# Patient Record
Sex: Male | Born: 1945
Health system: Southern US, Community
[De-identification: ages and names within clinical notes are randomized; demographics above are authoritative.]

## PROBLEM LIST (undated history)

## (undated) DIAGNOSIS — E785 Hyperlipidemia, unspecified: Secondary | ICD-10-CM

## (undated) DIAGNOSIS — R001 Bradycardia, unspecified: Secondary | ICD-10-CM

## (undated) DIAGNOSIS — D649 Anemia, unspecified: Secondary | ICD-10-CM

## (undated) DIAGNOSIS — N200 Calculus of kidney: Secondary | ICD-10-CM

## (undated) DIAGNOSIS — I10 Essential (primary) hypertension: Secondary | ICD-10-CM

## (undated) DIAGNOSIS — K56609 Unspecified intestinal obstruction, unspecified as to partial versus complete obstruction: Secondary | ICD-10-CM

## (undated) DIAGNOSIS — I209 Angina pectoris, unspecified: Secondary | ICD-10-CM

## (undated) DIAGNOSIS — K219 Gastro-esophageal reflux disease without esophagitis: Secondary | ICD-10-CM

## (undated) DIAGNOSIS — F32A Depression, unspecified: Secondary | ICD-10-CM

## (undated) DIAGNOSIS — E119 Type 2 diabetes mellitus without complications: Secondary | ICD-10-CM

## (undated) DIAGNOSIS — Z87442 Personal history of urinary calculi: Secondary | ICD-10-CM

## (undated) DIAGNOSIS — C801 Malignant (primary) neoplasm, unspecified: Secondary | ICD-10-CM

## (undated) DIAGNOSIS — I2510 Atherosclerotic heart disease of native coronary artery without angina pectoris: Secondary | ICD-10-CM

## (undated) HISTORY — DX: Atherosclerotic heart disease of native coronary artery without angina pectoris: I25.10

## (undated) HISTORY — PX: EYE SURGERY: SHX253

## (undated) HISTORY — PX: CORONARY ANGIOPLASTY: SHX604

## (undated) HISTORY — DX: Essential (primary) hypertension: I10

## (undated) HISTORY — DX: Bradycardia, unspecified: R00.1

## (undated) HISTORY — PX: CARDIAC CATHETERIZATION: SHX172

## (undated) HISTORY — PX: OTHER SURGICAL HISTORY: SHX169

## (undated) HISTORY — DX: Type 2 diabetes mellitus without complications: E11.9

## (undated) HISTORY — PX: CATARACT EXTRACTION: SUR2

## (undated) HISTORY — DX: Calculus of kidney: N20.0

## (undated) HISTORY — DX: Hyperlipidemia, unspecified: E78.5

## (undated) HISTORY — PX: KIDNEY STONE SURGERY: SHX686

## (undated) MED FILL — Dexamethasone Sodium Phosphate Inj 100 MG/10ML: INTRAMUSCULAR | Qty: 1 | Status: AC

---

## 2008-06-19 ENCOUNTER — Ambulatory Visit: Payer: Self-pay | Admitting: Ophthalmology

## 2008-06-26 ENCOUNTER — Ambulatory Visit: Payer: Self-pay | Admitting: Ophthalmology

## 2008-07-24 ENCOUNTER — Ambulatory Visit: Payer: Self-pay | Admitting: Ophthalmology

## 2008-08-19 ENCOUNTER — Emergency Department: Payer: Self-pay | Admitting: Emergency Medicine

## 2008-08-21 ENCOUNTER — Ambulatory Visit: Payer: Self-pay | Admitting: Urology

## 2008-08-23 ENCOUNTER — Ambulatory Visit: Payer: Self-pay | Admitting: Urology

## 2008-08-25 ENCOUNTER — Emergency Department: Payer: Self-pay | Admitting: Emergency Medicine

## 2008-09-06 ENCOUNTER — Ambulatory Visit: Payer: Self-pay | Admitting: Urology

## 2008-09-14 ENCOUNTER — Ambulatory Visit: Payer: Self-pay | Admitting: Urology

## 2008-10-19 ENCOUNTER — Inpatient Hospital Stay (HOSPITAL_COMMUNITY): Admission: RE | Admit: 2008-10-19 | Discharge: 2008-10-20 | Payer: Self-pay | Admitting: Cardiovascular Disease

## 2009-02-04 ENCOUNTER — Ambulatory Visit: Payer: Self-pay | Admitting: Urology

## 2009-05-24 ENCOUNTER — Encounter: Payer: Self-pay | Admitting: Cardiovascular Disease

## 2009-09-20 IMAGING — CR DG ABDOMEN 1V
1 series · 2 of 2 positions shown · non-contrast
Comparison: none

REASON FOR EXAM: Nephrolithiasis
COMMENTS:

[Series 1: view not recorded · 0.17mm/px · 2 of 2 slices shown]
[im 1/2]
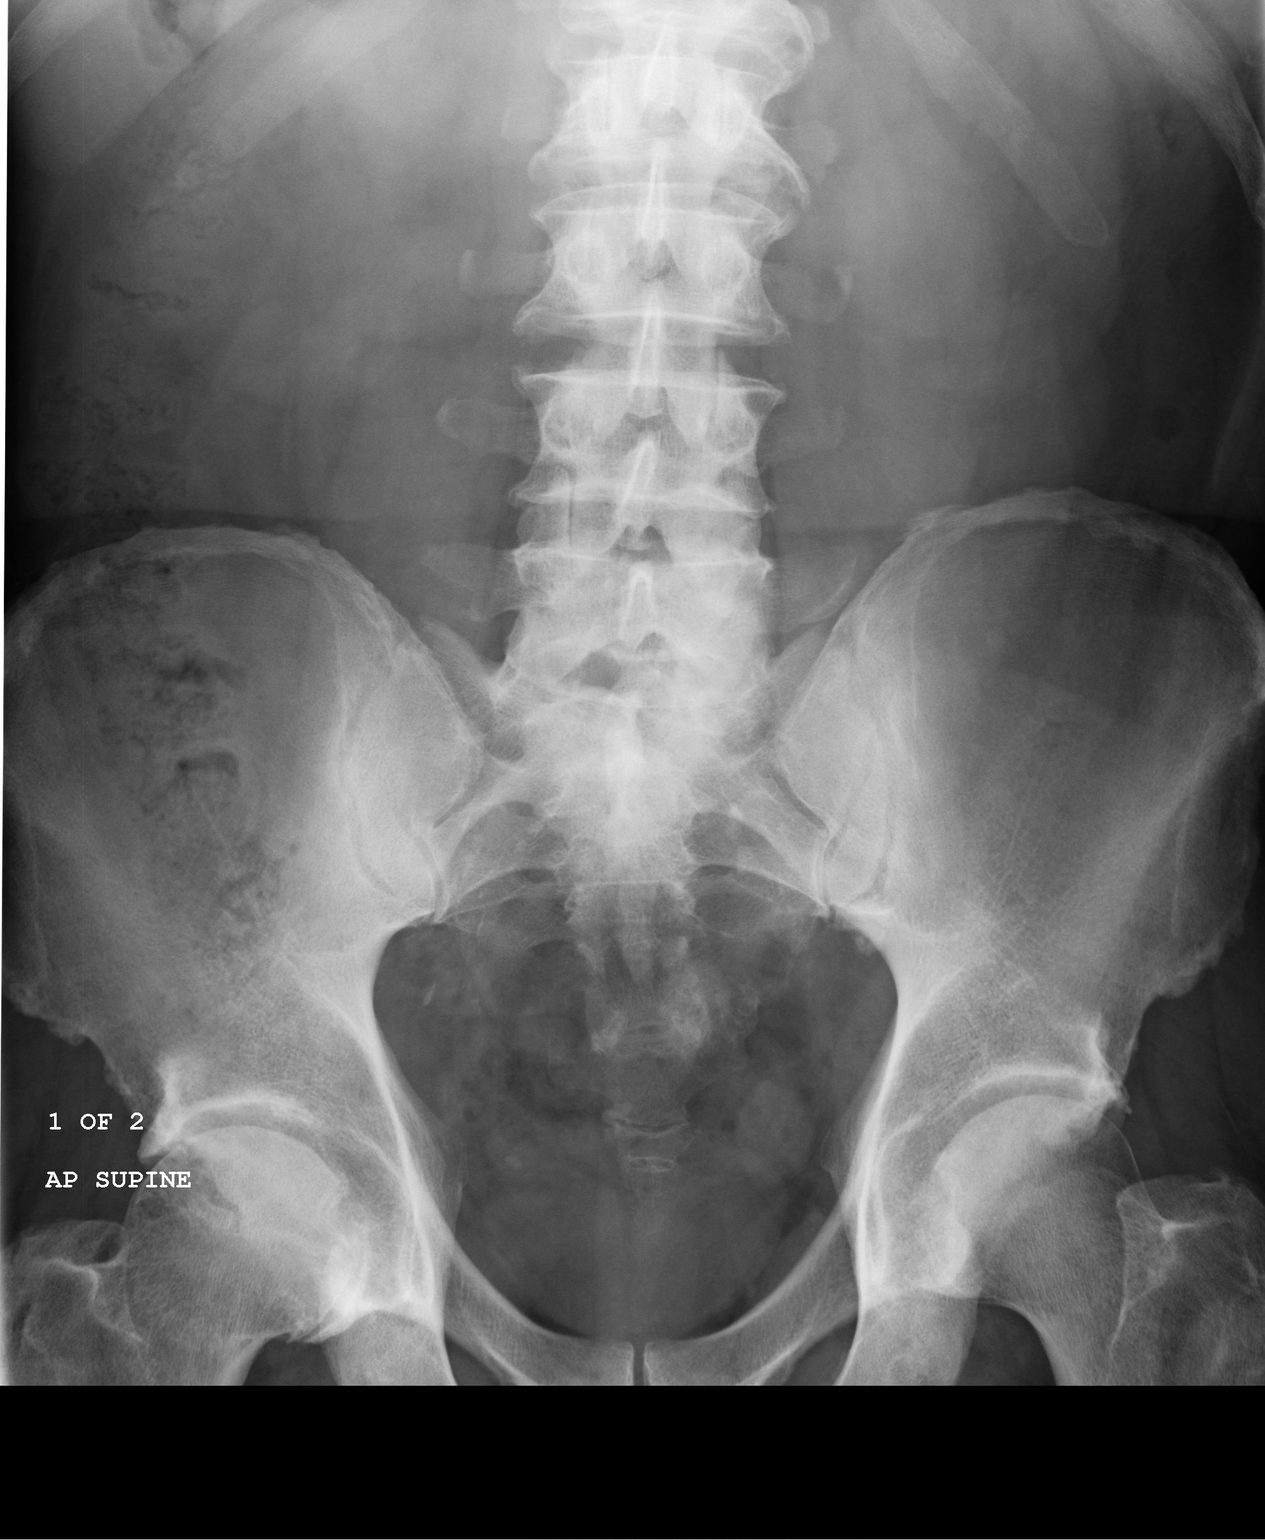
[im 2/2]
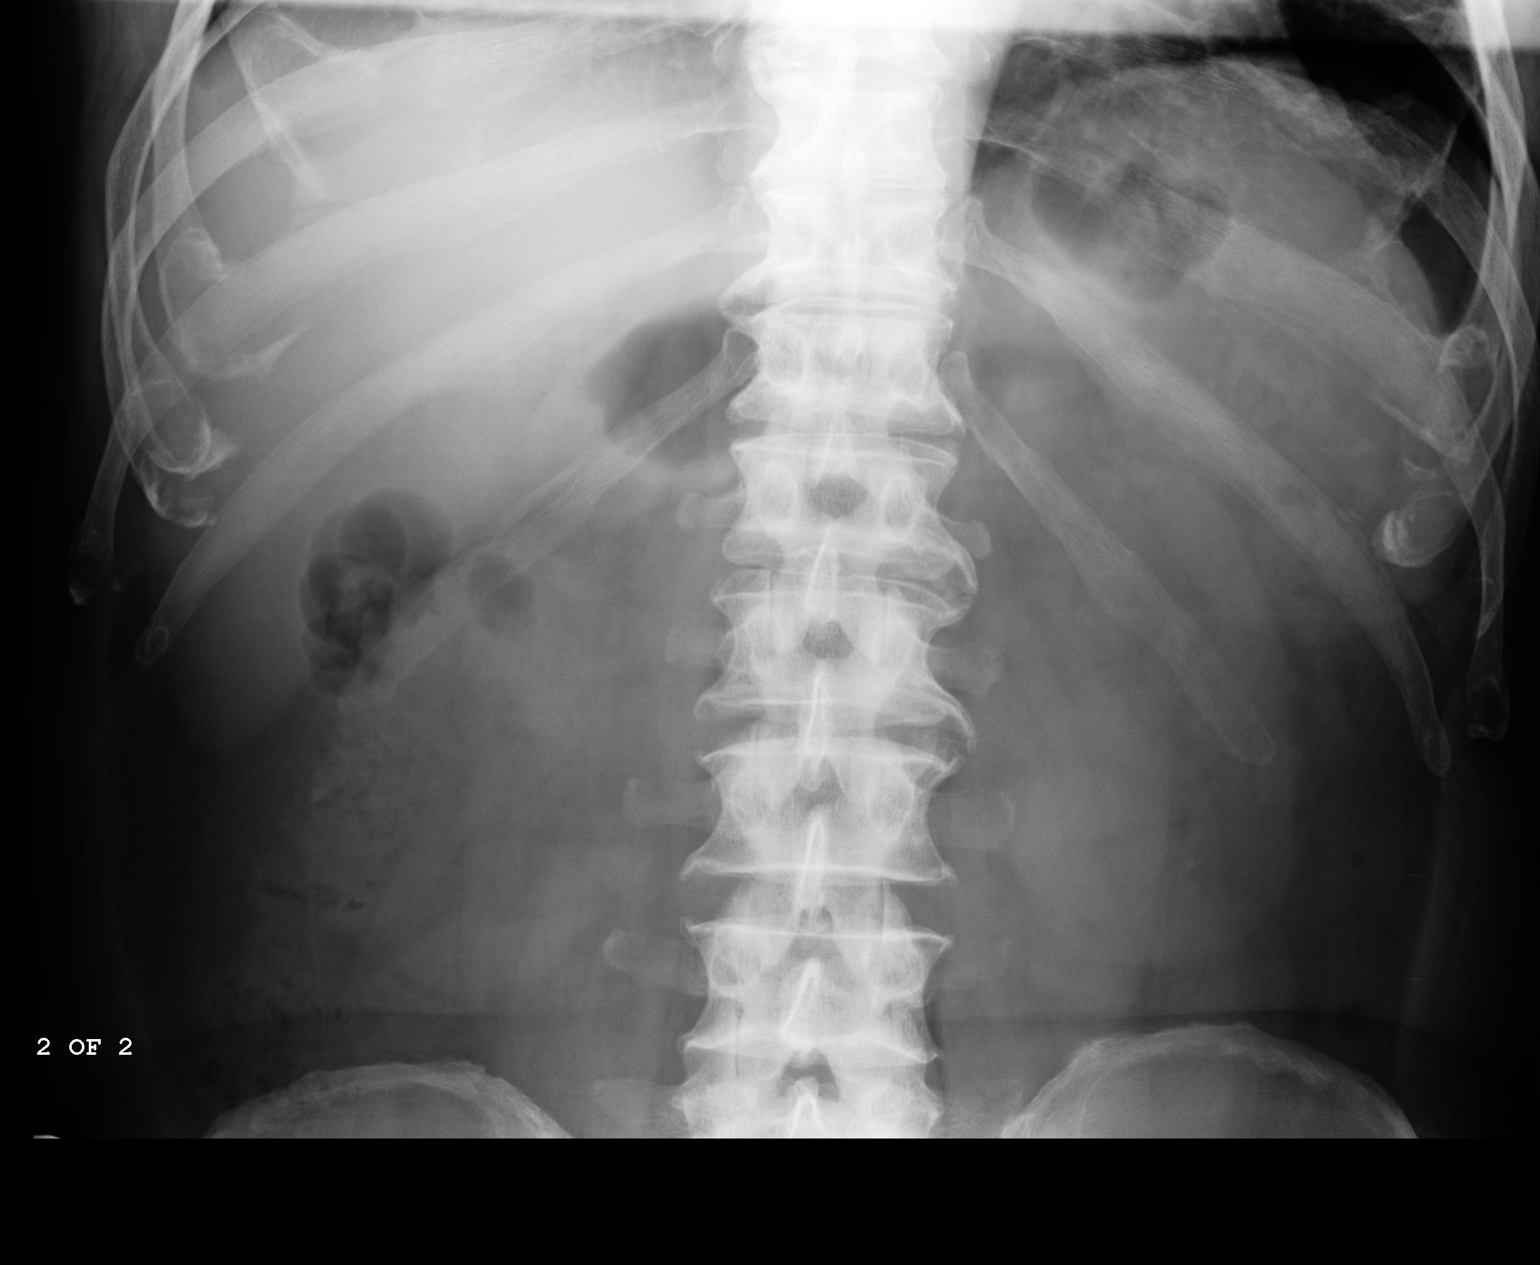

[2 of 2 positions shown; findings below may reference images not displayed]

PROCEDURE:     DXR - DXR KIDNEY URETER BLADDER  - August 21, 2008 [DATE]

RESULT:     Images of the abdomen demonstrate extensive degenerative changes
in the lumbar spine and lower thoracic spine. There is no abnormal bowel
distention. There is no definite renal calculus evident. There is some
density over the region of the distal RIGHT ureter which may account for the
previous stone seen on CT. The stones demonstrated at CT in the LEFT kidney
are not definitely evident
IMPRESSION: Please see above.

## 2009-10-06 IMAGING — CR DG ABDOMEN 1V
1 series · 2 of 2 positions shown · non-contrast
Comparison: none

REASON FOR EXAM: nephrolithiasis pt need films
COMMENTS:

[Series 1: view not recorded · 0.17mm/px · 2 of 2 slices shown]
[im 1/2]
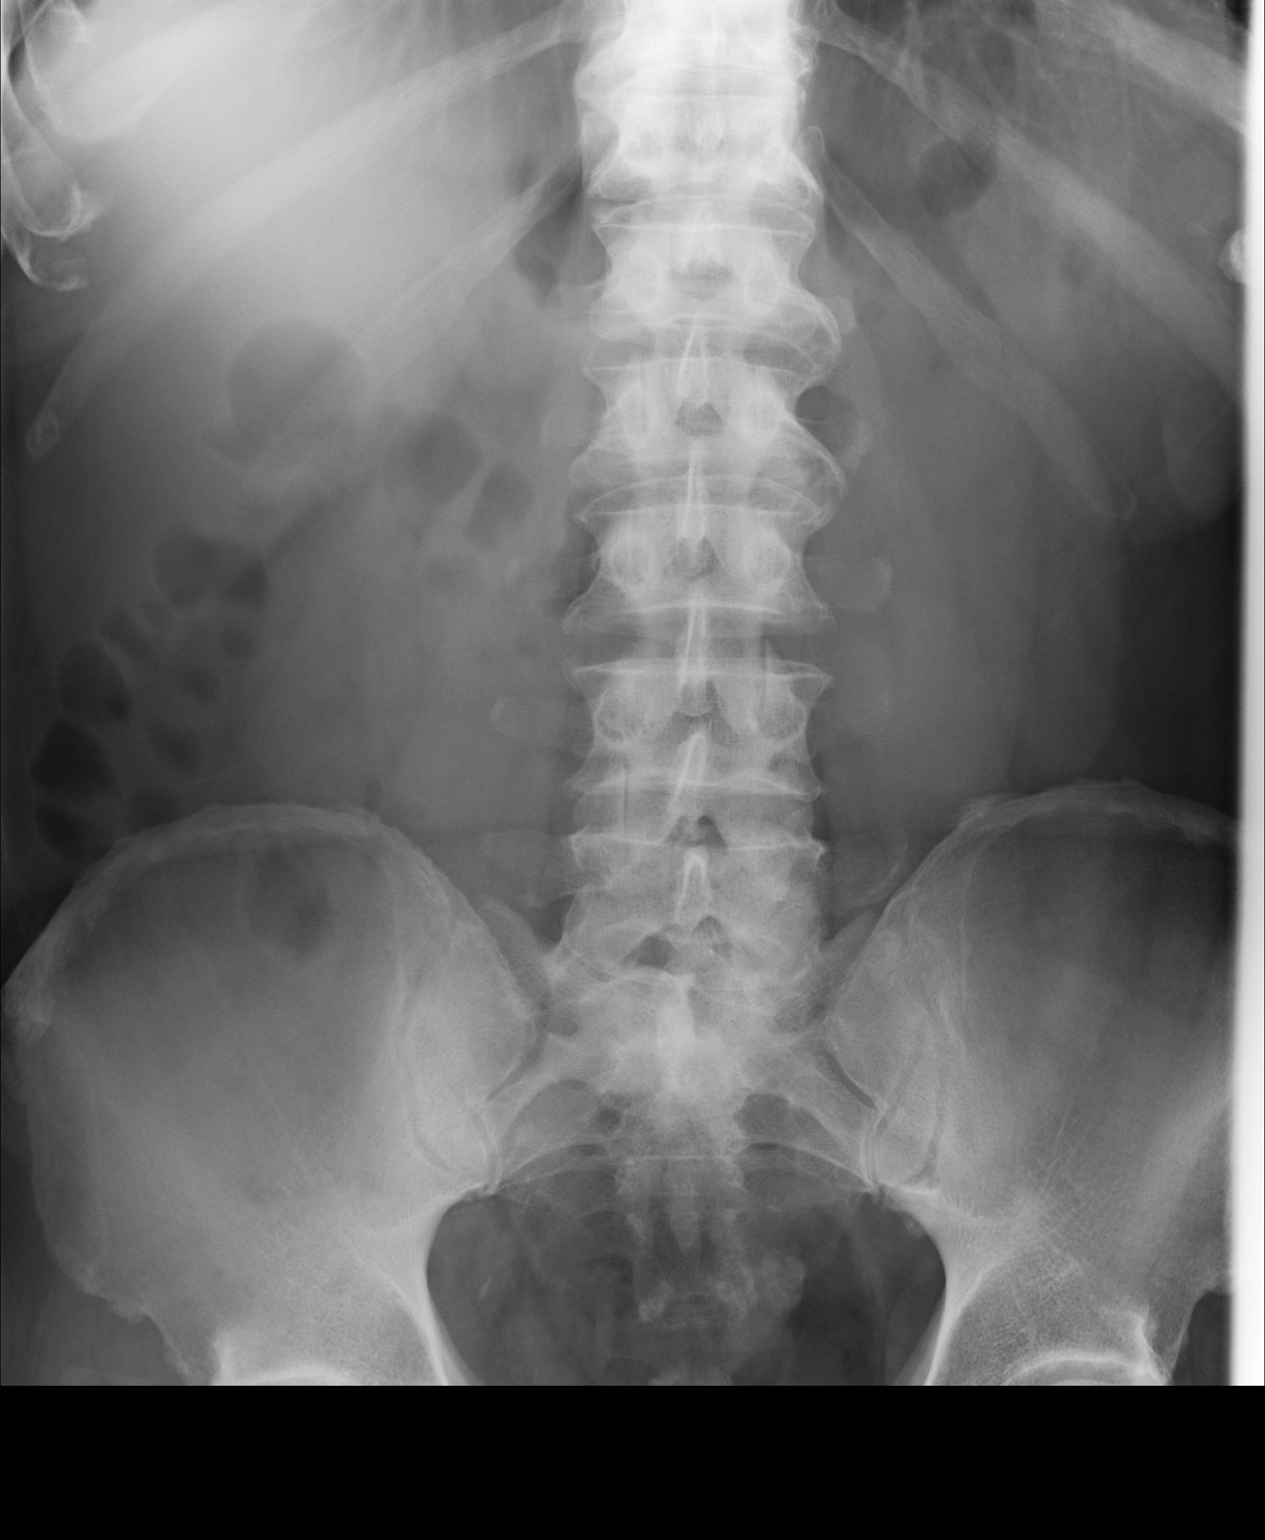
[im 2/2]
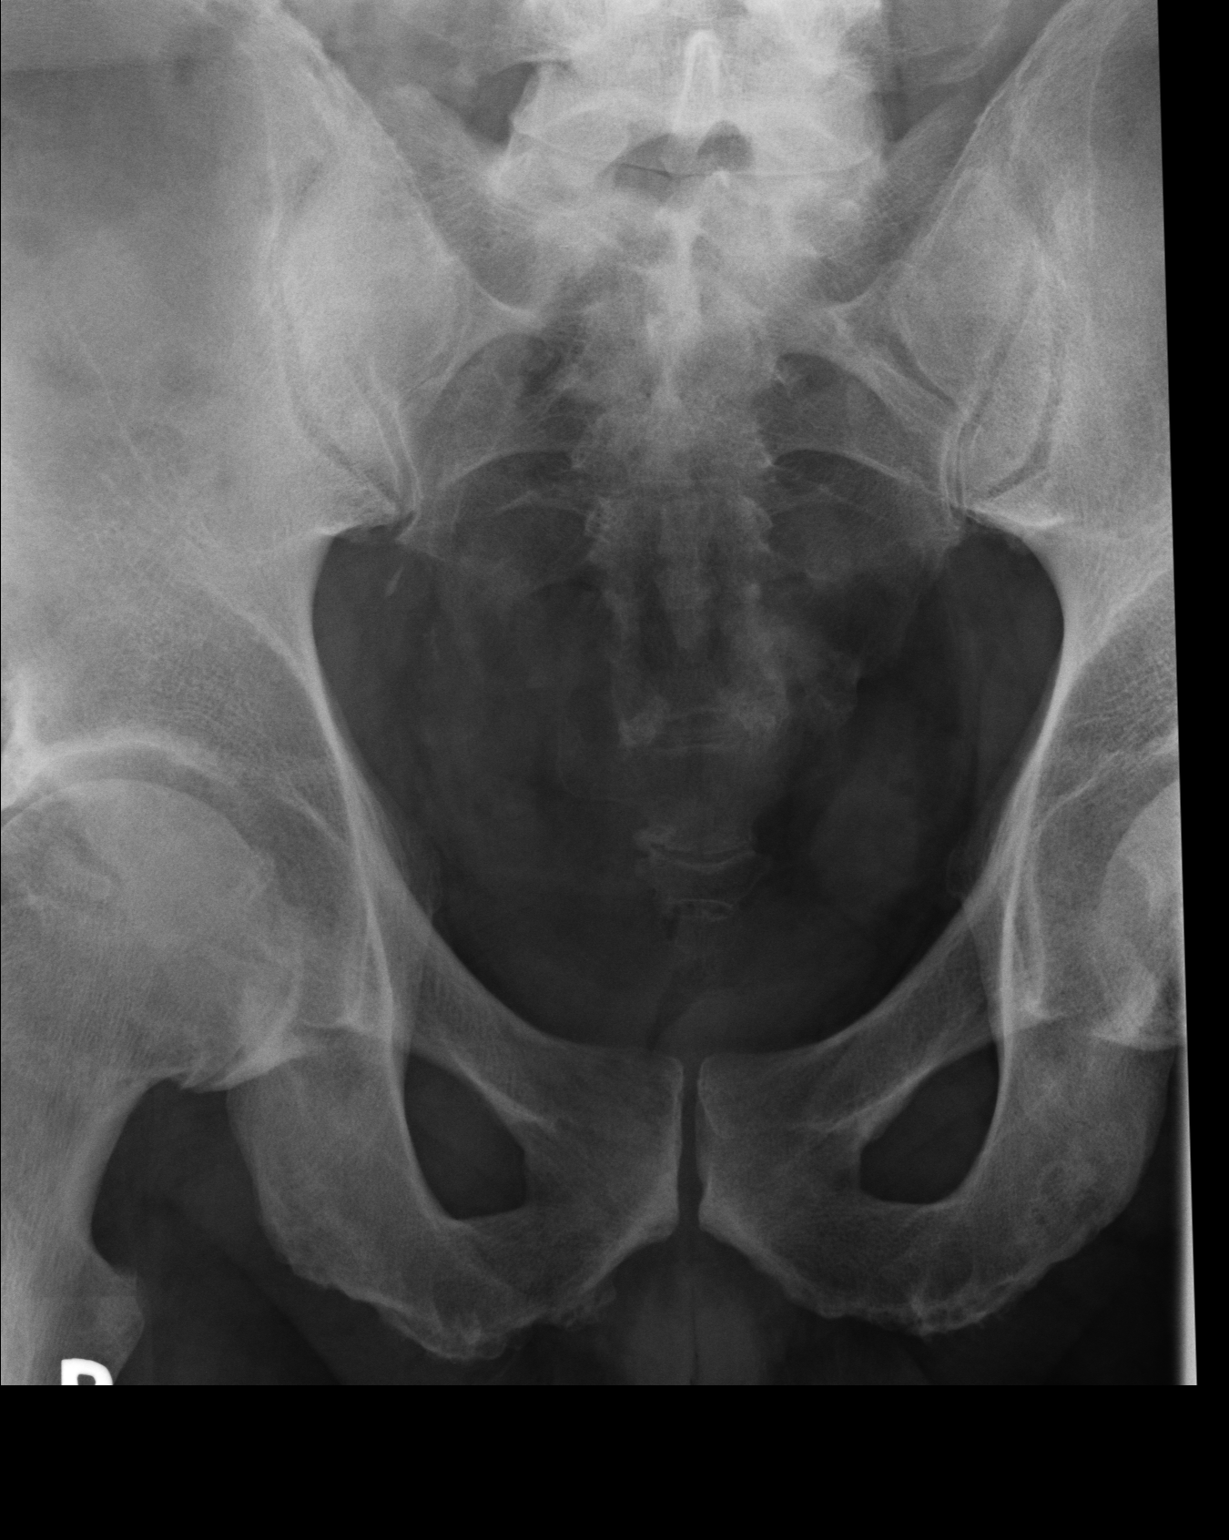

[2 of 2 positions shown; findings below may reference images not displayed]

PROCEDURE:     DXR - DXR KIDNEY URETER BLADDER  - September 06, 2008 [DATE]

RESULT:     Comparison is made to the prior exam of 08/23/2008. The distal
RIGHT ureteral stone previously observed is no longer seen. No definite
renal calcifications are identified. The LEFT nephrolithiasis noted at prior
CT is not identified on plain film examination.
IMPRESSION: 1.     Please see above.

## 2010-02-12 ENCOUNTER — Ambulatory Visit: Payer: Self-pay | Admitting: Urology

## 2010-02-13 ENCOUNTER — Encounter: Payer: Self-pay | Admitting: Cardiovascular Disease

## 2010-03-12 ENCOUNTER — Encounter: Payer: Self-pay | Admitting: Cardiovascular Disease

## 2010-04-04 ENCOUNTER — Ambulatory Visit: Payer: Self-pay | Admitting: Cardiovascular Disease

## 2010-04-04 DIAGNOSIS — F17200 Nicotine dependence, unspecified, uncomplicated: Secondary | ICD-10-CM

## 2010-04-04 DIAGNOSIS — E785 Hyperlipidemia, unspecified: Secondary | ICD-10-CM | POA: Insufficient documentation

## 2010-04-04 DIAGNOSIS — E1169 Type 2 diabetes mellitus with other specified complication: Secondary | ICD-10-CM

## 2010-04-04 DIAGNOSIS — I152 Hypertension secondary to endocrine disorders: Secondary | ICD-10-CM | POA: Insufficient documentation

## 2010-04-04 DIAGNOSIS — I2510 Atherosclerotic heart disease of native coronary artery without angina pectoris: Secondary | ICD-10-CM | POA: Insufficient documentation

## 2010-04-04 DIAGNOSIS — Z720 Tobacco use: Secondary | ICD-10-CM | POA: Insufficient documentation

## 2010-04-04 DIAGNOSIS — I498 Other specified cardiac arrhythmias: Secondary | ICD-10-CM

## 2010-04-04 DIAGNOSIS — I10 Essential (primary) hypertension: Secondary | ICD-10-CM

## 2010-04-04 HISTORY — DX: Type 2 diabetes mellitus with other specified complication: E11.69

## 2010-10-23 ENCOUNTER — Ambulatory Visit: Payer: Self-pay | Admitting: Cardiovascular Disease

## 2010-10-23 ENCOUNTER — Encounter: Payer: Self-pay | Admitting: Cardiovascular Disease

## 2010-11-07 ENCOUNTER — Encounter: Payer: Self-pay | Admitting: Cardiovascular Disease

## 2010-11-07 ENCOUNTER — Ambulatory Visit: Payer: Self-pay | Admitting: Cardiovascular Disease

## 2010-11-11 LAB — CONVERTED CEMR LAB
AST: 15 units/L (ref 0–37)
Albumin: 4.4 g/dL (ref 3.5–5.2)
HDL: 31 mg/dL — ABNORMAL LOW (ref >39)
Total Bilirubin: 0.6 mg/dL (ref 0.3–1.2)
Total CHOL/HDL Ratio: 4.8
VLDL: 41 mg/dL — ABNORMAL HIGH (ref 0–40)

## 2010-12-16 NOTE — Letter (Signed)
Summary: PHI  PHI   Imported By: Harlon Flor 04/08/2010 16:39:25  _____________________________________________________________________  External Attachment:    Type:   Image     Comment:   External Document

## 2010-12-16 NOTE — Miscellaneous (Signed)
Summary: lisinopril refill  Clinical Lists Changes  Medications: Added new medication of LISINOPRIL-HYDROCHLOROTHIAZIDE 20-12.5 MG TABS (LISINOPRIL-HYDROCHLOROTHIAZIDE) 1 tab by mouth daily - Signed Rx of LISINOPRIL-HYDROCHLOROTHIAZIDE 20-12.5 MG TABS (LISINOPRIL-HYDROCHLOROTHIAZIDE) 1 tab by mouth daily;  #30 x 6;  Signed;  Entered by: Charlena Cross, RN, BSN;  Authorized by: Dossie Arbour MD;  Method used: Electronically to [facility]*, [address] [place], [place], [place]  [address], Ph: [phone], Fax: [phone]    Prescriptions: LISINOPRIL-HYDROCHLOROTHIAZIDE 20-12.5 MG TABS (LISINOPRIL-HYDROCHLOROTHIAZIDE) 1 tab by mouth daily  #30 x 6   Entered by:   Charlena Cross, RN, BSN   Authorized by:   Dossie Arbour MD   Signed by:   Charlena Cross, RN, BSN on 02/13/2010   Method used:   Electronically to        [facility]* (retail)       [address] [place], [place]  [address]       Ph: [phone]       Fax: [phone]   RxID:   [id number]

## 2010-12-16 NOTE — Letter (Signed)
Summary: Medical Record Release  Medical Record Release   Imported By: Harlon Flor 03/12/2010 09:38:39  _____________________________________________________________________  External Attachment:    Type:   Image     Comment:   External Document

## 2010-12-16 NOTE — Assessment & Plan Note (Signed)
Summary: F6M/AMD   Visit Type:  Follow-up Primary Provider:  None  CC:  6 month f/u. c/o occasional soreness in chest. denies SOB and chest pains..  History of Present Illness: 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  He continues to smoke1/2 to one ppd. 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 He has not had much chest pain, only rarely which is mild and fleeting. He is getting over an upper respiratory infection. Otherwise he has no complaints  EKG shows sinus bradycardia with rate 53 beats per minute, intraventricular conduction delay, QRS 136 ms     Current Medications (verified): 1)  Lisinopril-Hydrochlorothiazide 20-12.5 Mg Tabs (Lisinopril-Hydrochlorothiazide) .Marland Kitchen.. 1 Tab By Mouth Daily 2)  Plavix 75 Mg Tabs (Clopidogrel Bisulfate) .Marland Kitchen.. 1 Tab Once Daily 3)  Nitrostat 0.4 Mg Subl (Nitroglycerin) .Marland Kitchen.. 1 Tablet Under Tongue At Onset of Chest Pain; You May Repeat Every 5 Minutes For Up To 3 Doses. 4)  Aspirin 81 Mg Tbec (Aspirin) .... When Pt Remembers 5)  Crestor 10 Mg Tabs (Rosuvastatin Calcium) .Marland Kitchen.. 1 Tab Once Daily 6)  Metoprolol Tartrate 25 Mg Tabs (Metoprolol Tartrate) .... 1/2 Tablet Two Times A Day 7)  Isosorbide Mononitrate Cr 30 Mg Xr24h-Tab (Isosorbide Mononitrate) .Marland Kitchen.. 1 Tab At Bedtime  Allergies (verified): No Known Drug Allergies  Past History:  Past Medical History: Last updated: 04/04/2010 CAD, NATIVE VESSEL (ICD-414.01) BRADYCARDIA (ICD-427.89) HYPERTENSION (ICD-401.9) HYPERLIPIDEMIA (ICD-272.4) TOBACCO ABUSE (ICD-305.1)  Past Surgical History: Last updated:  04/04/2010 stent cataract surgery nephrolithiasis  Family History: Last updated: 04/04/2010 Family History of Diabetes:  Family History of Coronary Artery Disease:   Social History: Last updated: 04/04/2010 Full Time Married  Tobacco Use - Yes.  Alcohol Use - no Regular Exercise - no Drug Use - no  Risk Factors: Exercise: no (04/04/2010)  Risk Factors: Smoking Status: current (04/04/2010)  Review of Systems  The patient denies fever, weight loss, weight gain, vision loss, decreased hearing, hoarseness, chest pain, syncope, dyspnea on exertion, peripheral edema, prolonged cough, abdominal pain, incontinence, muscle weakness, depression, and enlarged lymph nodes.         Rare chest pain  Vital Signs:  Patient profile:   65 year old male Height:      72 inches Weight:      206.75 pounds BMI:     28.14 Pulse rate:   53 / minute BP sitting:   112 / 68  (left arm) Cuff size:   regular  Vitals Entered By: Lysbeth Galas CMA (October 23, 2010 11:36 AM)  Physical Exam  General:  well-appearing middle-aged gentleman in no apparent distress, HEENT benign, oropharynx is clear, neck is supple with no JVP or carotid bruits, heart sounds are regular with normal S1-S2 and no murmurs appreciated, lungs are clear to auscultation with no wheezes Rales, abdominal exam is benign, no significant lower extremity edema, neurologic exam is nonfocal, skin is warm and dry. Pulses are equal and symmetrical in his upper and lower extremities.   Impression & Recommendations:  Problem # 1:  CAD, NATIVE VESSEL (ICD-414.01) continue aggressive lipid management. Encouraged smoking cessation again. No testing at this time as  no significant chest pain.  His updated medication list for this problem includes:    Lisinopril-hydrochlorothiazide 20-12.5 Mg Tabs (Lisinopril-hydrochlorothiazide) .Marland Kitchen... 1 tab by mouth daily    Plavix 75 Mg Tabs (Clopidogrel bisulfate) .Marland Kitchen... 1 tab once daily    Nitrostat  0.4 Mg Subl (Nitroglycerin) .Marland Kitchen... 1 tablet under tongue at onset of chest pain; you may repeat every 5 minutes for up to 3 doses.    Aspirin 81 Mg Tbec (Aspirin) .Marland Kitchen... When pt remembers    Metoprolol Tartrate 25 Mg Tabs (Metoprolol tartrate) .Marland Kitchen... 1/2 tablet two times a day    Isosorbide Mononitrate Cr 30 Mg Xr24h-tab (Isosorbide mononitrate) .Marland Kitchen... 1 tab at bedtime  Problem # 2:  HYPERLIPIDEMIA (ICD-272.4) We have asked him to come in for a lipid and LFTs in the next week or so.  His updated medication list for this problem includes:    Crestor 10 Mg Tabs (Rosuvastatin calcium) .Marland Kitchen... 1 tab once daily  Problem # 3:  TOBACCO ABUSE (ICD-305.1) Strongly encouraged him to stop smoking.  Problem # 4:  HYPERTENSION (ICD-401.9) blood pressure is well controlled on his current medication changes. No other changes made.  His updated medication list for this problem includes:    Lisinopril-hydrochlorothiazide 20-12.5 Mg Tabs (Lisinopril-hydrochlorothiazide) .Marland Kitchen... 1 tab by mouth daily    Aspirin 81 Mg Tbec (Aspirin) .Marland Kitchen... When pt remembers    Metoprolol Tartrate 25 Mg Tabs (Metoprolol tartrate) .Marland Kitchen... 1/2 tablet two times a day  Patient Instructions: 1)  Your physician recommends that you schedule a follow-up appointment in: 6 months 2)  Your physician recommends that you return for a FASTING lipid profile: In 1-2 weeks (LFTs/Lipid) 3)  Your physician recommends that you continue on your current medications as directed. Please refer to the Current Medication list given to you today. Prescriptions: ISOSORBIDE MONONITRATE CR 30 MG XR24H-TAB (ISOSORBIDE MONONITRATE) 1 tab at bedtime  #30 x 12   Entered by:   Lanny Hurst RN   Authorized by:   Dossie Arbour MD   Signed by:   Lanny Hurst RN on 10/23/2010   Method used:   Electronically to        [facility]* (retail)       [address] [place], [place]  [address]       Ph: [phone]       Fax: [phone]   RxID:    [id number]

## 2010-12-16 NOTE — Letter (Signed)
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      Sincerely,  Dr. Julien Nordmann

## 2010-12-16 NOTE — Assessment & Plan Note (Signed)
Summary: NP6/AMD   Visit Type:  new pt visit   History of Present Illness: 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 He was last seen at [facility] by myself in March of 2010.  He states that overall he's been doing well. He quit smoking for a short time though has been smoking one pack per day for a year. He's been off his Crestor for one year and it is uncertain whether the prescription ran out or he had muscle aches. 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    Current Medications (verified): 1)  Lisinopril-Hydrochlorothiazide 20-12.5 Mg Tabs (Lisinopril-Hydrochlorothiazide) .Marland Kitchen.. 1 Tab By Mouth Daily 2)  Plavix 75 Mg Tabs (Clopidogrel Bisulfate) .Marland Kitchen.. 1 Tab Once Daily 3)  Nitrostat 0.4 Mg Subl (Nitroglycerin) .Marland Kitchen.. 1 Tablet Under Tongue At Onset of Chest Pain; You May Repeat Every 5 Minutes For Up To 3 Doses. 4)  Aspirin 81 Mg Tbec (Aspirin) .... When Pt Remembers 5)  Crestor 10 Mg Tabs (Rosuvastatin Calcium) .Marland Kitchen.. 1 Tab Once Daily 6)  Pepcid 40 Mg Tabs (Famotidine) .Marland Kitchen.. 1 Tab Once Daily 7)  Metoprolol Tartrate 25 Mg Tabs (Metoprolol Tartrate) .Marland Kitchen.. 1 Tab Two Times A Day 8)  Isosorbide Mononitrate Cr 30 Mg Xr24h-Tab (Isosorbide Mononitrate) .Marland Kitchen.. 1 Tab At Bedtime  Allergies (verified): No Known Drug Allergies  Past History:  Past Medical History: Last updated:  04/04/2010 CAD, NATIVE VESSEL (ICD-414.01) BRADYCARDIA (ICD-427.89) HYPERTENSION (ICD-401.9) HYPERLIPIDEMIA (ICD-272.4) TOBACCO ABUSE (ICD-305.1)  Family History: Last updated: 04/04/2010 Family History of Diabetes:  Family History of Coronary Artery Disease:   Social History: Last updated: 04/04/2010 Full Time Married  Tobacco Use - Yes.  Alcohol Use - no Regular Exercise - no Drug Use - no  Risk Factors: Exercise: no (04/04/2010)  Risk Factors: Smoking Status: current (04/04/2010)  Past Surgical History: stent cataract surgery nephrolithiasis  Family History: Family History of Diabetes:  Family History of Coronary Artery Disease:   Social History: Full Time Married  Tobacco Use - Yes.  Alcohol Use - no Regular Exercise - no Drug Use - no Smoking Status:  current Does Patient Exercise:  no Drug Use:  no  Review of Systems  The patient denies fever, weight loss, weight gain, vision loss, decreased hearing, hoarseness, chest pain, syncope, dyspnea on exertion, peripheral edema, prolonged cough, abdominal pain, incontinence, muscle weakness, depression, and enlarged lymph nodes.    Vital Signs:  Patient profile:   65 year old male Height:      72 inches Weight:      203 pounds BMI:     27.63 Pulse rate:   48 / minute Pulse rhythm:   irregular BP sitting:   138 / 76  (left arm) Cuff size:   large  Vitals Entered By: Danielle Rankin, CMA (Apr 04, 2010 10:27 AM)  Physical Exam  General:  well-appearing middle-aged gentleman in no apparent distress, HEENT  benign, oropharynx is clear, neck is supple with no JVP or carotid bruits, heart sounds are regular with normal S1-S2 and no murmurs appreciated, lungs are clear to auscultation with no wheezes Rales, abdominal exam is benign, no significant lower extremity edema, neurologic exam is nonfocal, skin is warm and dry. Pulses are equal and symmetrical in his upper and lower extremities.    EKG  Procedure  date:  04/04/2010  Findings:      Normal sinus rhythm with rate 48 beats per minute, right bundle branch block, no significant ST or T wave changes.  Impression & Recommendations:  Problem # 1:  CAD, NATIVE VESSEL (ICD-414.01) [name] is at high risk of worsening coronary disease as he is not on a statin, he continues to smoke one pack per day. We have talked to him about his smoking, alcohol or progression of his underlying coronary disease and worsening emphysema.  He does not want to try 10 6, and he will try other over-the-counter alternatives. His friend has electronic cigarettes and he will try this.  We'll restart his cholesterol medication, continue aspirin and Plavix.  His updated medication list for this problem includes:    Lisinopril-hydrochlorothiazide 20-12.5 Mg Tabs (Lisinopril-hydrochlorothiazide) .Marland Kitchen... 1 tab by mouth daily    Plavix 75 Mg Tabs (Clopidogrel bisulfate) .Marland Kitchen... 1 tab once daily    Nitrostat 0.4 Mg Subl (Nitroglycerin) .Marland Kitchen... 1 tablet under tongue at onset of chest pain; you may repeat every 5 minutes for up to 3 doses.    Aspirin 81 Mg Tbec (Aspirin) .Marland Kitchen... When pt remembers    Metoprolol Tartrate 25 Mg Tabs (Metoprolol tartrate) .Marland Kitchen... 1/2 tablet two times a day    Isosorbide Mononitrate Cr 30 Mg Xr24h-tab (Isosorbide mononitrate) .Marland Kitchen... 1 tab at bedtime  Problem # 2:  HYPERLIPIDEMIA (ICD-272.4) We will restart Crestor 10 mg daily as he is unsure why this was stopped. He was previously on simcor who I believe he did not reach his goal LDL.  His updated medication list for this problem includes:    Crestor 10 Mg Tabs (Rosuvastatin calcium) .Marland Kitchen... 1 tab once daily  Problem # 3:  BRADYCARDIA (ICD-427.89) Heart rate is low on metoprolol and we will cut the dose in half and have him monitor his heart rates. If it continues to be low, we will stop this altogether. He has not been symptomatic with no lightheadedness or dizziness.  His updated medication list for  this problem includes:    Lisinopril-hydrochlorothiazide 20-12.5 Mg Tabs (Lisinopril-hydrochlorothiazide) .Marland Kitchen... 1 tab by mouth daily    Plavix 75 Mg Tabs (Clopidogrel bisulfate) .Marland Kitchen... 1 tab once daily    Nitrostat 0.4 Mg Subl (Nitroglycerin) .Marland Kitchen... 1 tablet under tongue at onset of chest pain; you may repeat every 5 minutes for up to 3 doses.    Aspirin 81 Mg Tbec (Aspirin) .Marland Kitchen... When pt remembers    Metoprolol Tartrate 25 Mg Tabs (Metoprolol tartrate) .Marland Kitchen... 1/2 tablet two times a day    Isosorbide Mononitrate Cr 30 Mg Xr24h-tab (Isosorbide mononitrate) .Marland Kitchen... 1 tab at bedtime  Problem # 4:  HYPERTENSION (ICD-401.9) Blood pressure is well controlled on his medication regimen. We have made no changes at this time apart from decreasing his metoprolol.  His updated medication list for this problem includes:    Lisinopril-hydrochlorothiazide 20-12.5 Mg Tabs (Lisinopril-hydrochlorothiazide) .Marland Kitchen... 1 tab by mouth daily    Aspirin 81 Mg Tbec (Aspirin) .Marland Kitchen... When pt remembers    Metoprolol Tartrate 25 Mg Tabs (Metoprolol tartrate) .Marland Kitchen... 1/2  tablet two times a day  Patient Instructions: 1)  Your physician recommends that you schedule a follow-up appointment in: 6 months 2)  Your physician has recommended you make the following change in your medication: Decrease your Metoprolol to 12.5mg  two times a day (1/2 tablet two times a day). Start taking Crestor 10mg  daily.   3)  Your physician discussed the hazards of tobacco use.  Tobacco use cessation is recommended and techniques and options to help you quit were discussed. Prescriptions: CRESTOR 10 MG TABS (ROSUVASTATIN CALCIUM) 1 tab once daily  #30 x 6   Entered by:   Cloyde Reams RN   Authorized by:   Dossie Arbour MD   Signed by:   Cloyde Reams RN on 04/04/2010   Method used:   Electronically to        [facility]* (retail)       [address] [place], [place]  [address]       Ph: [phone]       Fax:  [phone]   RxID:   [id number]

## 2011-02-02 ENCOUNTER — Telehealth: Payer: Self-pay | Admitting: Cardiovascular Disease

## 2011-02-11 ENCOUNTER — Encounter: Payer: Self-pay | Admitting: *Deleted

## 2011-02-11 ENCOUNTER — Ambulatory Visit (INDEPENDENT_AMBULATORY_CARE_PROVIDER_SITE_OTHER): Payer: BC Managed Care – PPO | Admitting: *Deleted

## 2011-02-11 ENCOUNTER — Telehealth: Payer: Self-pay | Admitting: Emergency Medicine

## 2011-02-11 DIAGNOSIS — R079 Chest pain, unspecified: Secondary | ICD-10-CM

## 2011-02-11 NOTE — Telephone Encounter (Signed)
Ok to send for PT. Don't know if it will be covered by insurance as we have no office notes documenting should problems. We can try. He does need a PMD. Try Dr. Aron Baba or [facility]

## 2011-02-11 NOTE — Telephone Encounter (Signed)
After speaking with pt when they called back. Pt is having chest pain, difficulty breathing, and left arm and shoulder pain x 1 day. Per Aundra Millet, pt is coming in for EKG this afternoon to make sure nothing is going on with heart. Pt notified and will be coming in around 3 or 4

## 2011-02-11 NOTE — Progress Notes (Signed)
Pt in for EKG, pt c/o left arm and shoulder pain. Pt's EKG NSR with HR 56. After asking pt about SOB, he does not think this is a problem. He c/o "soreness" in left arm and shoulder and states when he applies pressure there is relief. Dr. Mariah Milling reviewed EKG and pt hx. Advised pt to take ibuprofen or Motrin every 4-6 hours or as directed on medication to see if this relieves pain. Notified pt that this does sound musculoskeletal in nature. Told pt to monitor symptoms after taking anti-inflammatory and to call back in a couple of days to let us know if symptoms have improved. Pt ok with this.

## 2011-02-11 NOTE — Telephone Encounter (Signed)
Doris states that patient does have PCP but never goes to him. Going to [facility], did see Dr.Neimeyer. Notified Doris that she needs to call over there and ask if they have any records of patient having any type of pain. Doris is to call me back before I send over referral to PT./ ([facility])

## 2011-02-11 NOTE — Telephone Encounter (Signed)
Doris called wanting to know if you can clear and send order for him for PT next door. This is for his shoulder and arm pain. Pt doesn't have a PCP.

## 2011-02-12 ENCOUNTER — Encounter: Payer: Self-pay | Admitting: Emergency Medicine

## 2011-02-12 ENCOUNTER — Telehealth: Payer: Self-pay | Admitting: Cardiovascular Disease

## 2011-02-12 NOTE — Telephone Encounter (Signed)
Pt would like to be referred to PT @ [facility].

## 2011-02-12 NOTE — Telephone Encounter (Signed)
Pt was told yesterday at his nurse visit that he would need to be referred by PCP. Pt does not have PCP and pt was advised by Huntley Dec that he needed to get MD, and was given number to [facility]. Pt was told that insurance might not pay if we refer him to PT.

## 2011-02-12 NOTE — Progress Notes (Signed)
Summary: RX  Phone Note Refill Request Call back at Home Phone [phone] Message from:  Patient on February 02, 2011 10:30 AM  Refills Requested: Medication #1:  METOPROLOL TARTRATE 25 MG TABS 1/2 tablet two times a day [place]   Initial call taken by: Harlon Flor,  February 02, 2011 10:30 AM    Prescriptions: METOPROLOL TARTRATE 25 MG TABS (METOPROLOL TARTRATE) 1/2 tablet two times a day  #30 x 6   Entered by:   Lysbeth Galas CMA   Authorized by:   Dossie Arbour MD   Signed by:   Lysbeth Galas CMA on 02/02/2011   Method used:   Electronically to        [facility]* (retail)       [address] [place], [place]  [address]       Ph: [phone]       Fax: [phone]   RxID:   [id number]

## 2011-03-31 NOTE — Cardiovascular Report (Signed)
NAME:  [name] [name], [name] [name] NO.:  [id number]   MEDICAL RECORD NO.:  [id number]          PATIENT TYPE:  INP   LOCATION:  2807                         FACILITY:  [facility]   PHYSICIAN:  Antonieta Iba, MD   DATE OF BIRTH:  [birth year]   DATE OF PROCEDURE:  10/19/2008  DATE OF DISCHARGE:                            CARDIAC CATHETERIZATION   INDICATIONS FOR STUDY:  [name] [name] is a very pleasant 65 year old  gentleman with a history of coronary calcification picked up on CT scan  by Dr. Assunta Gambles of the Urology Department.  He presented to our clinic  for further evaluation.  He was noted to have symptoms of chest pain and  increased shortness of breath.  He had a stress test that showed basal-  to-mid inferolateral wall ischemia as well as ischemia in the inferior  wall.  Ejection fraction on the stress test was 39% post stress.  There  was moderate hypokinesis post stress of the inferolateral wall.  This  was felt to be high risk scanning and was scheduled for cardiac  catheterization.   [name] [name] presented on October 19, 2008, to [facility] hospital for his  cardiac catheterization.   PROCEDURE DETAILS:  The risks and benefits of the procedure were  discussed with [name] [name] and consent was obtained.  He was prepped and  draped in the usual sterile fashion.  The modified Seldinger technique  was used to cannulate the right femoral artery using a Judkins 6-French  JL-4 catheter as well as a JR-4 catheter to engage the left main and  right coronary arteries respectively.  Hand injection of contrasts were  performed to visualize the coronaries.  LV gram was performed using a  pigtail catheter with pullback to assess the aortic valve gradient.  Given the findings from the study, the sheath was kept in place until  the pictures could be reviewed by Dr. Yates Decamp for possible  intervention.   PROCEDURE FINDINGS:  Left main:  Left main is a moderate-to-large size  vessel with  no significant disease noted.  It trifurcates into the LAD,  ramus, and left circumflex.   Left anterior descending:  The LAD is a moderate-to-large size vessel  that extends to the apex.  There is 30% proximal disease before the  takeoff of the D1 branch.  There is also some small diffuse distal  disease estimated at 30% diffusely.  There are three diagonal vessels  that are all small and caliber.   Ramus:  The ramus is a moderate size branch that bifurcates distally.  There is no significant disease noted.   Left circumflex:  The left circumflex is a moderate-to-large sized  vessel that is occluded in its proximal region after the second OM.  The  OM I takes off in the ostial region and is small in nature.  The OM II  takes off from the proximal region of the circumflex prior to the 100%  occlusion.  There is least moderate diffuse disease of the proximal  circumflex prior to the occlusion.  The mid-to-distal left circumflex  and  obtuse marginal branches distally are visualized from right-to-left  collaterals.  The mid circumflex after the occlusion has a short region  of 60% disease.  There are 3-4 obtuse marginal branches.  They are small-  to-moderate in size with only mild diffuse disease.   Right coronary artery:  The RCA is a large dominant branch with has a  PDA and PL branch distally.  There are numerous collaterals extending  from right to left.  There is a focal 80% lesion in the proximal RCA  that is likely the culprit lesion for the positive stress test.   FINAL IMPRESSION:  Right dominant coronary system with severe two-vessel  coronary artery disease.  There is a 100% occlusion of the proximal left  circumflex with the mid distal circumflex collateralized by the right  coronary artery.  There is a severe lesion in the proximal RCA that is  likely the culprit lesion and sponsored for the patient's  symptoms as well as positive stress test.  LV gram did not show any   significant hypokinesis of the inferior wall.  There is no aortic  stenosis or mitral regurgitation noted.  The sheath was kept in place  until reviewed the pictures could be made by Dr. Yates Decamp for possible  intervention today.      Antonieta Iba, MD  Electronically Signed     TJG/MEDQ  D:  10/19/2008  T:  10/19/2008  Job:  [id number]

## 2011-03-31 NOTE — Cardiovascular Report (Signed)
NAME:  PERRI, LAMAGNA                ACCOUNT NO.:  [id number]   MEDICAL RECORD NO.:  [id number]          PATIENT TYPE:  INP   LOCATION:  6524                         FACILITY:  [facility]   PHYSICIAN:  Cristy Hilts. Jacinto Halim, MD       DATE OF BIRTH:  [birth year]   DATE OF PROCEDURE:  10/19/2008  DATE OF DISCHARGE:                            CARDIAC CATHETERIZATION   PROCEDURES PERFORMED:  Percutaneous transluminal coronary angioplasty  and direct stenting of the right coronary artery.   INDICATIONS:  Mr. Jonathyn Carothers is a 65 year old gentleman who has a  history of smoking and hypertension.  He has had coronary calcification  by CT scan of his chest.  He has been having chest discomfort.  He  underwent diagnostic cardiac catheterization by Dr. Dossie Arbour earlier  this morning and was found to have an occluded circumflex coronary  artery, which is codominant coronary artery and a large right coronary  artery with a proximal ulcerated 70-80% stenosis, hence I was asked for  intervening on the codominant right coronary artery, which is a large  caliber vessel.   INTERVENTION DATA:  1. Successful PTCA and direct stenting of the proximal right coronary      artery with implantation of a 4.0 x 12 mm Driver stent deployed at      16 atmospheric pressure and this stent was post-dilated with a 4.5      x 8 mm Voyager again at 14 atmospheric pressure peak.  Overall,      stenosis was reduced from 80% to 0% with brisk TIMI 3 to TIMI 3      flow maintained at the end of the procedure.  2. Unsuccessful attempt at trying to open the chronically occluded mid      circumflex coronary artery.  I did use a 1.5-mm balloon support,      but in spite of this, I was unable to cross this and appears to be      chronic total occlusion.   RECOMMENDATIONS:  I can certainly try to re-attempt to open the  codominant circumflex coronary artery.  If angina persist or if he has  significant stress test abnormalities, we could  certainly consider this  at a later date.  FineCross catheter will probably help in crossing the  CTO.   He has type 3 collaterals from the right coronary artery to the  circumflex coronary artery, hopefully, this will help in his  symptomatology.   He will need Plavix for at least a period of 4-6 weeks and aspirin  indefinitely and continued risk modification is indicated.   A total of 105 mL of contrast was utilized for interventional procedure.   TECHNIQUE OF PROCEDURE:  Exchanging the 6-French sheath to a 7-French  sheath.  A 7-French FR-4 guide was utilized to engage the right coronary  artery using Angiomax for anticoagulation maintaining ACT greater than  200.  ATW guidewire was advanced into the right coronary artery and  direct stenting with the Driver stent was performed followed by post-  dilatation with a Voyager balloon at high pressures.  Having performed  this, intracoronary glycerin was administered and angiography was  repeated.  Excellent results were noted.   Attention was directed towards the circumflex coronary artery, and using  a XB 4.0 guide, left main coronary artery was selectively engaged, and  using initially, [employer] I was unable to cross the circumflex  coronary artery, hence a 1.5 x 10 mm Apex balloon was utilized and I  attempted to cross this with a WhisperWire, but because of  inability to do so, the whole system was withdrawn out of the body.  Angiography was repeated.  A guide catheter disengaged and pulled out of  the body.  The patient tolerated the procedure.  There were no immediate  complications.      Cristy Hilts. Jacinto Halim, MD  Electronically Signed     JRG/MEDQ  D:  10/19/2008  T:  10/19/2008  Job:  [id number]   cc:   Dr. Leonard Schwartz. Gwynneth Macleod, MD

## 2011-03-31 NOTE — Discharge Summary (Signed)
NAMEJUD, FANGUY                ACCOUNT NO.:  [id number]   MEDICAL RECORD NO.:  [id number]          PATIENT TYPE:  INP   LOCATION:  6524                         FACILITY:  [facility]   PHYSICIAN:  Antonieta Iba, MD   DATE OF BIRTH:  02/22/1946   DATE OF ADMISSION:  10/19/2008  DATE OF DISCHARGE:  10/20/2008                               DISCHARGE SUMMARY   DISCHARGE DIAGNOSES:  1. Fatigue and weakness secondary to coronary artery disease.  2. Abnormal stress test.  3. Coronary artery disease.  4. Percutaneous transluminal coronary angioplasty and stent deployment      to the right coronary artery, large codominant vessel with a Driver      stent reducing 80% stenosis to 0.  Please note, the patient also      has minimal disease in the left anterior descending system and 100%      circumflex stenosis.  5. Previous ejection fraction 37% but nuclear study is currently      normal, ejection fraction by cardiac catheterization.  6. Tobacco abuse.  7. Hyperlipidemia.  8. Hypertension.  9. Sinus bradycardia.   DISCHARGE CONDITION:  Stable, improved.   DISCHARGE MEDICATIONS:  1. Potassium over the counter.  2. Magnesium over the counter as before.  3. Lisinopril/hydrochlorothiazide 20/12.5, 1 daily.  4. Plavix 75 mg 1daily, do not stop, stopping cause the heart attack.  5. Pepcid 40 mg 1 daily to protect stomach.  6. Aspirin 325 mg daily, new dose.  7. Nitroglycerin 1/150 one under the tongue while sitting if needed      for chest pain, 1 every 5 minutes up to 3/15 minutes if pain      continues, call 911.  8. Crestor 20 mg half a tablet equal to 10 mg daily.  9. No beta-blocker was given to the patient secondary to bradycardia      here and as an outpatient.  If heart rate increases, reevaluation      for beta-blocker will be decided.   DISCHARGE INSTRUCTIONS:  1. Return to work on October 24, 2008 or October 25, 2008, depending      on work schedule.  2. Low-sodium  heart-healthy diet.  3. Wash cath site with soap and water.  Call us if any bleeding,      swelling, or drainage at the site.  4. Increase activity as tolerated.  5. May shower or bath.  6. No lifting for 2 days.  7. No driving for 2 days.  8. Follow up with Dr. Mariah Milling in 1 week.  The office will call and      arrange things at the time.  9. Stop smoking.  10.Need to take Crestor for cholesterol now as well as a healthy diet      to prevent further disease in the coronary arteries.  The bad      cholesterol was 104 and the goal is 70 or less.   The patient was given  prescription for Plavix, Pepcid, nitro, and  Crestor.   HISTORY OF PRESENT ILLNESS:  A 65 year old gentleman with history of  tobacco, hypertension, coronary calcification seen on CT scan with  complaints of weakness and fatigue.  He has difficulty working due to  his current weakness and fatigue.  He is a [employer] architect, continues to  smoke about a pack a day.  The patient because of the positive stress  test, and EF of 37% to 39% on the stress test, discussion was for  cardiac catheterization.  The patient agreed and the patient came in on  October 19, 2008, for elective cardiac cath.  He was found to have a  100% circumflex disease and nonobstructive minimal LAD disease, but he  did have 80% stenosis of his RCA, which is also important because the  RCA is feeding the circumflex vessels.  The patient underwent PTCA and  stent deployment by Dr. Jacinto Halim with a Driver stent and tolerated the  procedure well.  On October 20, 2008, he is stable and ready for  discharge, ambulate with cardiac rehab without problems.  He was  educated concerning nitro, 911, and restrictions.  Anticipated  compliance per rehab was poor to fair.   PHYSICAL EXAMINATION AT DISCHARGE:  VITAL SIGNS:  Temperature 97.7,  blood pressure 136/65, pulse 57, respiratory rate 16, and oxygen  saturation greater than 95% on room air.  He has no complaints.   RESPIRATIONS:  Clear to auscultation bilaterally.  CARDIOVASCULAR:  Regular rate and rhythm without murmur.  ABDOMEN:  Soft and nontender.  Positive bowel sounds.  EXTREMITIES:  Cath site stable.  No lower extremity edema.  NEUROLOGIC:  Alert and oriented.  SKIN:  Warm and dry.   LABORATORY DATA:  Hematocrit of 40, platelets 130, and WBC 9.  Sodium  136, potassium 4.1, BUN 15, creatinine 0.99, and glucose 103.  Troponin  0.03 and CK 65.   The patient was seen and discharged by Dr. Mariah Milling and will follow up as  an outpatient.  Again, no beta-blocker was given secondary to decreased  heart rate.      Darcella Gasman. Annie Paras, N.P.      Antonieta Iba, MD  Electronically Signed    LRI/MEDQ  D:  10/20/2008  T:  10/21/2008  Job:  [id number]   cc:   Antonieta Iba, MD  Cristy Hilts Jacinto Halim, MD  Franne Forts

## 2011-04-17 ENCOUNTER — Encounter: Payer: Self-pay | Admitting: Cardiovascular Disease

## 2011-04-17 ENCOUNTER — Ambulatory Visit (INDEPENDENT_AMBULATORY_CARE_PROVIDER_SITE_OTHER): Payer: No Typology Code available for payment source | Admitting: Cardiovascular Disease

## 2011-04-17 DIAGNOSIS — I2510 Atherosclerotic heart disease of native coronary artery without angina pectoris: Secondary | ICD-10-CM

## 2011-04-17 DIAGNOSIS — E785 Hyperlipidemia, unspecified: Secondary | ICD-10-CM

## 2011-04-17 DIAGNOSIS — I498 Other specified cardiac arrhythmias: Secondary | ICD-10-CM

## 2011-04-17 DIAGNOSIS — I10 Essential (primary) hypertension: Secondary | ICD-10-CM

## 2011-04-17 DIAGNOSIS — F17200 Nicotine dependence, unspecified, uncomplicated: Secondary | ICD-10-CM

## 2011-04-17 MED ORDER — TADALAFIL 5 MG PO TABS: 5.0000 mg | ORAL_TABLET | Freq: Every day | ORAL | Status: AC | PRN

## 2011-04-17 MED ORDER — ATORVASTATIN CALCIUM 20 MG PO TABS: 20.0000 mg | ORAL_TABLET | Freq: Every day | ORAL | Status: DC

## 2011-04-17 MED ORDER — LISINOPRIL-HYDROCHLOROTHIAZIDE 20-12.5 MG PO TABS: 0.5000 | ORAL_TABLET | Freq: Every day | ORAL | Status: DC

## 2011-04-17 NOTE — Progress Notes (Signed)
   Patient ID: [name] [name], male    DOB: Sep 14, 1946, 65 y.o.   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   He continues to smoke1/2 to one ppd. 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 He has not had much chest pain, only rarely which is mild and fleeting. He has managed to lose 20 pounds by watching his diet. He does have some chronic fatigue. He reports that his heart rate has been low and his blood pressure has been borderline low. He has not been taking his Crestor as he ran out of samples.   EKG shows sinus bradycardia with rate 50 beats per minute, intraventricular conduction delay, QRS 130 ms        Review of Systems  Constitutional: Positive for fatigue.  HENT: Negative.   Eyes: Negative.   Respiratory: Negative.   Cardiovascular: Negative.   Gastrointestinal: Negative.   Musculoskeletal: Negative.   Skin: Negative.   Neurological: Negative.   Hematological: Negative.   Psychiatric/Behavioral: Negative.   All other systems reviewed and are negative.   BP 102/78  Pulse 51  Ht 6' (1.829 m)  Wt 196 lb (88.905 kg)  BMI 26.58 kg/m2   Physical Exam  Nursing note and vitals reviewed. Constitutional: He is oriented to person, place, and time. He appears well-developed and well-nourished.  HENT:  Head: Normocephalic.  Nose: Nose normal.  Mouth/Throat: Oropharynx is clear and moist.  Eyes: Conjunctivae are normal. Pupils are equal, round, and reactive to light.  Neck: Normal range of motion. Neck supple. No JVD present.  Cardiovascular: Normal rate,  regular rhythm, S1 normal, S2 normal, normal heart sounds and intact distal pulses.  Exam reveals no gallop and no friction rub.   No murmur heard. Pulmonary/Chest: Effort normal and breath sounds normal. No respiratory distress. He has no wheezes. He has no rales. He exhibits no tenderness.  Abdominal: Soft. Bowel sounds are normal. He exhibits no distension. There is no tenderness.  Musculoskeletal: Normal range of motion. He exhibits no edema and no tenderness.  Lymphadenopathy:    He has no cervical adenopathy.  Neurological: He is alert and oriented to person, place, and time. Coordination normal.  Skin: Skin is warm and dry. No rash noted. No erythema.  Psychiatric: He has a normal mood and affect. His behavior is normal. Judgment and thought content normal.           Assessment and Plan

## 2011-04-17 NOTE — Patient Instructions (Addendum)
You are doing well. Please cut the lisinopril/HCT in 1/2  On a trial basis, hold the metoprolol and see how you feel. If energy is much better, continue to hold metoprolol  Your physician recommends that you return for a FASTING lipid profile: 3 months (lipid/lft)  Start Cialis as needed, we will call in 5mg  tablet, but take 10-20mg  when needed, BUT HOLD YOUR ISOSORBIDE THE EVENING OF AND THE NEXT DAY WHEN TAKING.  Start taking Lipitor 20mg . In the meantime, take Crestor 5mg  samples given.  Please call us if you have new issues that need to be addressed before your next appt.  We will call you for a follow up Appt. In 6 months

## 2011-04-18 NOTE — Assessment & Plan Note (Signed)
We will hold his metoprolol  12.5 mg b.i.d. For now given his bradycardia and fatigue

## 2011-04-18 NOTE — Assessment & Plan Note (Signed)
Currently with no symptoms of angina. No further workup at this time. Continue current medication regimen. 

## 2011-04-18 NOTE — Assessment & Plan Note (Signed)
Blood pressure is low and we have suggested he decrease his lisinopril HCT in half

## 2011-04-18 NOTE — Assessment & Plan Note (Signed)
We have encouraged him to continue to work on weaning his cigarettes and smoking cessation. He will continue to work on this and does not want any assistance with chantix.  

## 2011-04-18 NOTE — Assessment & Plan Note (Signed)
Will prescribe Lipitor 20 mg daily. Check cholesterol in 3 months

## 2011-05-21 ENCOUNTER — Other Ambulatory Visit: Payer: Self-pay | Admitting: Cardiovascular Disease

## 2011-05-21 MED ORDER — FAMOTIDINE 40 MG PO TABS: 40.0000 mg | ORAL_TABLET | Freq: Every day | ORAL | Status: DC

## 2011-05-21 MED ORDER — CLOPIDOGREL BISULFATE 75 MG PO TABS: 75.0000 mg | ORAL_TABLET | Freq: Every day | ORAL | Status: DC

## 2011-05-21 NOTE — Telephone Encounter (Signed)
Pt would like 6 refills.

## 2011-06-04 ENCOUNTER — Encounter: Payer: Self-pay | Admitting: Cardiovascular Disease

## 2011-07-24 ENCOUNTER — Other Ambulatory Visit: Payer: No Typology Code available for payment source | Admitting: *Deleted

## 2011-07-27 ENCOUNTER — Ambulatory Visit: Payer: Self-pay | Admitting: Unknown Physician Specialty

## 2011-08-10 ENCOUNTER — Telehealth: Payer: Self-pay | Admitting: Cardiovascular Disease

## 2011-08-10 NOTE — Telephone Encounter (Signed)
Please advise 

## 2011-08-10 NOTE — Telephone Encounter (Signed)
Dr Gerrit Heck needs for pt to be off his plavix for 4-5 pending surgery. Pt will only need to be off 1 day if epidural only. [phone] fax [phone]. Please call or fax office with clearance

## 2011-08-13 ENCOUNTER — Encounter: Payer: Self-pay | Admitting: *Deleted

## 2011-08-21 LAB — CBC
HCT: 38 % — ABNORMAL LOW (ref 39.0–52.0)
HCT: 40.3 % (ref 39.0–52.0)
MCHC: 34.2 g/dL (ref 30.0–36.0)
MCV: 92.1 fL (ref 78.0–100.0)
Platelets: 127 10*3/uL — ABNORMAL LOW (ref 150–400)
Platelets: 130 10*3/uL — ABNORMAL LOW (ref 150–400)
RBC: 4.17 MIL/uL — ABNORMAL LOW (ref 4.22–5.81)
RDW: 13.9 % (ref 11.5–15.5)
WBC: 6.8 10*3/uL (ref 4.0–10.5)

## 2011-08-21 LAB — BASIC METABOLIC PANEL
BUN: 15 mg/dL (ref 6–23)
Creatinine, Ser: 0.99 mg/dL (ref 0.4–1.5)
GFR calc non Af Amer: >60 mL/min (ref >60)

## 2011-08-21 LAB — CARDIAC PANEL(CRET KIN+CKTOT+MB+TROPI)
CK, MB: 2.1 ng/mL (ref 0.3–4.0)
Total CK: 97 U/L (ref 7–232)
Troponin I: 0.03 ng/mL (ref 0.00–0.06)
Troponin I: 0.05 ng/mL (ref 0.00–0.06)

## 2011-08-24 ENCOUNTER — Ambulatory Visit: Payer: Self-pay | Admitting: Unknown Physician Specialty

## 2011-10-22 ENCOUNTER — Encounter: Payer: Self-pay | Admitting: Cardiovascular Disease

## 2011-10-28 ENCOUNTER — Ambulatory Visit (INDEPENDENT_AMBULATORY_CARE_PROVIDER_SITE_OTHER): Payer: BC Managed Care – PPO | Admitting: Cardiovascular Disease

## 2011-10-28 ENCOUNTER — Encounter: Payer: Self-pay | Admitting: Cardiovascular Disease

## 2011-10-28 VITALS — BP 138/76 | HR 57 | Ht 72.0 in | Wt 199.0 lb

## 2011-10-28 DIAGNOSIS — I2510 Atherosclerotic heart disease of native coronary artery without angina pectoris: Secondary | ICD-10-CM

## 2011-10-28 DIAGNOSIS — I498 Other specified cardiac arrhythmias: Secondary | ICD-10-CM

## 2011-10-28 DIAGNOSIS — E785 Hyperlipidemia, unspecified: Secondary | ICD-10-CM

## 2011-10-28 DIAGNOSIS — I10 Essential (primary) hypertension: Secondary | ICD-10-CM

## 2011-10-28 DIAGNOSIS — F17200 Nicotine dependence, unspecified, uncomplicated: Secondary | ICD-10-CM

## 2011-10-28 NOTE — Assessment & Plan Note (Signed)
We have encouraged him to continue to work on weaning his cigarettes and smoking cessation. He will continue to work on this and does not want any assistance with chantix.  

## 2011-10-28 NOTE — Patient Instructions (Signed)
You are doing well. Restart aspirin 81 mg daily We have ordered a treadmill study for today for work clearance  Please call us if you have new issues that need to be addressed before your next appt.  The office will contact you for a follow up Appt. In 6 months

## 2011-10-28 NOTE — Assessment & Plan Note (Signed)
Currently with no symptoms of angina. No further workup at this time. Continue current medication regimen. 

## 2011-10-28 NOTE — Progress Notes (Signed)
Exercise Treadmill Test   Treadmill ordered for work physical  Resting EKG shows NSR with rate of 69 bpm, no significant ST or T wave changes Resting blood pressure of 124/78 Stand bruce protocal was used.  Patient exercised for 9 min 41 sec,  Peak heart rate of 130 bpm.  This was 83% of the maximum predicted heart rate (target heart rate 157. No symptoms of chest pain or lightheadedness were reported at peak stress or in recovery.  Peak Blood pressure recorded was 162/80 Heart rate at 3 minutes in recovery was 97 bpm. ST region showed 1 mm depression with upsloping interval at peak stress  FINAL IMPRESSION: Normal exercise stress test. No significant EKG changes concerning for ischemia. Good exercise tolerance. No further cardiac testing is indicated at this time.   Would recommend clearance to continue work. He had regular appt. In our office every 6 months.

## 2011-10-28 NOTE — Progress Notes (Signed)
Patient ID: [name] [name], male    DOB: December 02, 1945, 65 y.o.   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   He continues to smoke1/2 to one ppd. He denies any significant chest pain or shortness of breath. He continues to work with no symptoms. He currently helps load and unload trucks. He does report feeling some mild fatigue which he attributes to getting older. He does not feel as strong. He reports that he was scheduled for next surgery which was delayed after he developed a upper respiratory infection. This has been rescheduled for January 2013.   EKG shows sinus bradycardia with rate 57 beats per minute, no significant ST or T wave changes      Outpatient Encounter Prescriptions as of 10/28/2011  Medication Sig Dispense Refill  . atorvastatin (LIPITOR) 20 MG tablet Take 1 tablet (20 mg total) by mouth daily.  30 tablet  11  . clopidogrel (PLAVIX) 75 MG tablet Take 1 tablet (75 mg total) by mouth daily.  30 tablet  11  . famotidine (PEPCID) 40 MG tablet Take 1 tablet (40 mg total) by mouth daily.  30 tablet  11  . HYDROcodone-acetaminophen (NORCO) 5-325 MG per tablet Take 1 tablet by mouth every 6 (six) hours as needed.        . isosorbide mononitrate (IMDUR) 30 MG 24 hr tablet Take 30 mg by mouth daily.        Marland Kitchen lisinopril-hydrochlorothiazide (PRINZIDE,ZESTORETIC) 20-12.5 MG per tablet Take 0.5 tablets by mouth daily.  30 tablet  6  . aspirin 81 MG EC tablet Take 81 mg by mouth daily.        Marland Kitchen DISCONTD: ibuprofen (ADVIL,MOTRIN) 200 MG tablet Take 200 mg by mouth every 6 (six) hours as needed.        Marland Kitchen DISCONTD:  rosuvastatin (CRESTOR) 10 MG tablet Take 10 mg by mouth daily.           Review of Systems  Constitutional: Positive for fatigue.  HENT: Negative.   Eyes: Negative.   Respiratory: Negative.   Cardiovascular: Negative.   Gastrointestinal: Negative.   Musculoskeletal: Negative.   Skin: Negative.   Neurological: Negative.   Hematological: Negative.   Psychiatric/Behavioral: Negative.   All other systems reviewed and are negative.   BP 138/76  Pulse 57  Ht 6' (1.829 m)  Wt 199 lb (90.266 kg)  BMI 26.99 kg/m2   Physical Exam  Nursing note and vitals reviewed. Constitutional: He is oriented to person, place, and time. He appears well-developed and well-nourished.  HENT:  Head: Normocephalic.  Nose: Nose normal.  Mouth/Throat: Oropharynx is clear and moist.  Eyes: Conjunctivae are normal. Pupils are equal, round, and reactive to light.  Neck: Normal range of motion. Neck supple. No JVD present.  Cardiovascular: Normal rate, regular rhythm, S1 normal, S2 normal, normal heart sounds and intact distal pulses.  Exam reveals no gallop and no friction rub.   No murmur heard. Pulmonary/Chest: Effort normal and breath sounds normal. No respiratory distress. He has no wheezes. He has no rales. He exhibits no tenderness.  Abdominal: Soft. Bowel sounds are normal. He exhibits no distension. There is no tenderness.  Musculoskeletal: Normal range of motion. He exhibits no edema and no tenderness.  Lymphadenopathy:    He has no cervical adenopathy.  Neurological: He is alert and oriented to person, place, and time. Coordination normal.  Skin: Skin is warm and dry. No rash noted. No erythema.  Psychiatric: He has a normal mood and affect. His behavior is normal. Judgment and thought content normal.           Assessment and Plan

## 2011-10-28 NOTE — Patient Instructions (Signed)
Continue on your current medication regimen. Good exercise tolerance with no significant EKG changes noted on her treadmill study.

## 2011-10-28 NOTE — Assessment & Plan Note (Signed)
No symptoms. No on a b-blocker.

## 2011-10-28 NOTE — Assessment & Plan Note (Signed)
Blood pressure is well controlled on today's visit. No changes made to the medications. 

## 2011-10-28 NOTE — Assessment & Plan Note (Signed)
Continue lipitor. Goal LDL ,70

## 2011-11-15 ENCOUNTER — Ambulatory Visit (INDEPENDENT_AMBULATORY_CARE_PROVIDER_SITE_OTHER): Payer: BC Managed Care – PPO

## 2011-11-15 DIAGNOSIS — R7989 Other specified abnormal findings of blood chemistry: Secondary | ICD-10-CM

## 2011-12-05 ENCOUNTER — Ambulatory Visit (INDEPENDENT_AMBULATORY_CARE_PROVIDER_SITE_OTHER): Payer: BC Managed Care – PPO

## 2011-12-05 DIAGNOSIS — E119 Type 2 diabetes mellitus without complications: Secondary | ICD-10-CM

## 2011-12-18 HISTORY — PX: NECK SURGERY: SHX720

## 2011-12-24 ENCOUNTER — Telehealth: Payer: Self-pay | Admitting: Cardiovascular Disease

## 2011-12-24 NOTE — Telephone Encounter (Signed)
Ok to stop plavix 7 days. Perhaps he can stay on asa during surgery Would restart plavix after surgery

## 2011-12-24 NOTE — Telephone Encounter (Signed)
Note faxed to Ortho at number listed below.

## 2011-12-24 NOTE — Telephone Encounter (Signed)
DR CALIFF office needs surgical clearance for pt. Pt also needs to be off plavix for 7 days prior is this ok??

## 2012-01-01 ENCOUNTER — Ambulatory Visit: Payer: Self-pay | Admitting: Unknown Physician Specialty

## 2012-01-01 LAB — BASIC METABOLIC PANEL
Anion Gap: 4 — ABNORMAL LOW (ref 7–16)
BUN: 18 mg/dL (ref 7–18)
Chloride: 105 mmol/L (ref 98–107)
Creatinine: 0.9 mg/dL (ref 0.60–1.30)
Potassium: 4.7 mmol/L (ref 3.5–5.1)
Sodium: 138 mmol/L (ref 136–145)

## 2012-01-01 LAB — URINALYSIS, COMPLETE
Bacteria: NONE SEEN
Bilirubin,UR: NEGATIVE
Blood: NEGATIVE
Glucose,UR: NEGATIVE mg/dL (ref 0–75)
Leukocyte Esterase: NEGATIVE
Nitrite: NEGATIVE
Protein: NEGATIVE
RBC,UR: <1 /HPF (ref 0–5)
Specific Gravity: 1.02 (ref 1.003–1.030)
Squamous Epithelial: <1

## 2012-01-01 LAB — CBC
HCT: 44.9 % (ref 40.0–52.0)
MCH: 31.7 pg (ref 26.0–34.0)
MCV: 94 fL (ref 80–100)
Platelet: 143 10*3/uL — ABNORMAL LOW (ref 150–440)
RBC: 4.8 10*6/uL (ref 4.40–5.90)
RDW: 13.2 % (ref 11.5–14.5)
WBC: 10 10*3/uL (ref 3.8–10.6)

## 2012-01-01 LAB — MRSA PCR SCREENING

## 2012-01-05 ENCOUNTER — Ambulatory Visit: Payer: Self-pay | Admitting: Unknown Physician Specialty

## 2012-02-22 ENCOUNTER — Other Ambulatory Visit: Payer: Self-pay | Admitting: Cardiovascular Disease

## 2012-02-22 MED ORDER — ISOSORBIDE MONONITRATE ER 30 MG PO TB24: 30.0000 mg | ORAL_TABLET | Freq: Every day | ORAL | Status: DC

## 2012-02-22 NOTE — Telephone Encounter (Signed)
Refill sent for imdur 30 mg  

## 2012-05-13 ENCOUNTER — Encounter: Payer: Self-pay | Admitting: Cardiovascular Disease

## 2012-05-13 ENCOUNTER — Other Ambulatory Visit: Payer: Self-pay | Admitting: *Deleted

## 2012-05-13 ENCOUNTER — Ambulatory Visit (INDEPENDENT_AMBULATORY_CARE_PROVIDER_SITE_OTHER): Payer: BC Managed Care – PPO | Admitting: Cardiovascular Disease

## 2012-05-13 VITALS — BP 120/72 | HR 57 | Ht 72.0 in | Wt 193.0 lb

## 2012-05-13 DIAGNOSIS — I10 Essential (primary) hypertension: Secondary | ICD-10-CM

## 2012-05-13 DIAGNOSIS — I2510 Atherosclerotic heart disease of native coronary artery without angina pectoris: Secondary | ICD-10-CM

## 2012-05-13 DIAGNOSIS — E785 Hyperlipidemia, unspecified: Secondary | ICD-10-CM

## 2012-05-13 DIAGNOSIS — R079 Chest pain, unspecified: Secondary | ICD-10-CM

## 2012-05-13 DIAGNOSIS — R634 Abnormal weight loss: Secondary | ICD-10-CM

## 2012-05-13 DIAGNOSIS — F17200 Nicotine dependence, unspecified, uncomplicated: Secondary | ICD-10-CM

## 2012-05-13 MED ORDER — ATORVASTATIN CALCIUM 20 MG PO TABS: 20.0000 mg | ORAL_TABLET | Freq: Every day | ORAL | Status: DC

## 2012-05-13 MED ORDER — LISINOPRIL-HYDROCHLOROTHIAZIDE 20-12.5 MG PO TABS: 1.0000 | ORAL_TABLET | Freq: Every day | ORAL | Status: DC

## 2012-05-13 NOTE — Patient Instructions (Addendum)
You are doing well. No medication changes were made.  We will check blood work today  Please call us if you have new issues that need to be addressed before your next appt.  Your physician wants you to follow-up in: 6 months.  You will receive a reminder letter in the mail two months in advance. If you don't receive a letter, please call our office to schedule the follow-up appointment.   

## 2012-05-13 NOTE — Assessment & Plan Note (Signed)
Continue current statin. Goal LDL less than 70 

## 2012-05-13 NOTE — Assessment & Plan Note (Signed)
We have encouraged him to continue to work on weaning his cigarettes and smoking cessation. He will continue to work on this and does not want any assistance with chantix.  

## 2012-05-13 NOTE — Telephone Encounter (Signed)
Refilled Atorvastatin.

## 2012-05-13 NOTE — Assessment & Plan Note (Signed)
Blood pressure is well controlled on today's visit. No changes made to the medications. 

## 2012-05-13 NOTE — Assessment & Plan Note (Signed)
Currently with no symptoms of angina. No further workup at this time. Continue current medication regimen. 

## 2012-05-13 NOTE — Progress Notes (Signed)
Patient ID: [name] [name], male    DOB: 1946-11-05, 66 y.o.   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   He continues to smoke less than one pack per day. 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 He was out for less than 2 months with cervical spine surgery February 2013. He feels that he did not give it enough time before he went back to work. He continues to have some neck pain. The most difficult period was the recovery and he cannot eat very much. He feels that he lost some weight through this.. he denies any significant chest pain or shortness of breath. He plans on retiring next year  EKG shows sinus bradycardia with rate 57 beats per minute, no significant ST or T wave changes      Outpatient Encounter Prescriptions as of 05/13/2012  Medication Sig Dispense Refill  . aspirin 81 MG EC tablet Take 81 mg by mouth daily.        Marland Kitchen atorvastatin (LIPITOR) 20 MG tablet Take 20 mg by mouth daily.      . clopidogrel (PLAVIX) 75 MG tablet Take 1 tablet (75 mg total) by mouth daily.  30 tablet  11  . famotidine (PEPCID) 40 MG tablet Take 1 tablet (40 mg total) by mouth daily.  30 tablet  11  . isosorbide mononitrate (IMDUR) 30 MG 24 hr tablet Take 1 tablet (30 mg total) by mouth daily.  30 tablet  6  . lisinopril-hydrochlorothiazide (PRINZIDE,ZESTORETIC) 20-12.5 MG per tablet Take 0.5 tablets by mouth daily.  30 tablet  6  . metFORMIN (GLUCOPHAGE) 500 MG tablet Take 500 mg by mouth 2 (two) times daily with a meal.       Review of Systems  HENT: Negative.   Eyes: Negative.   Respiratory: Negative.     Cardiovascular: Negative.   Gastrointestinal: Negative.   Musculoskeletal: Negative.        Neck pain  Skin: Negative.   Neurological: Negative.   Hematological: Negative.   Psychiatric/Behavioral: Negative.   All other systems reviewed and are negative.   BP 120/72  Pulse 57  Ht 6' (1.829 m)  Wt 193 lb (87.544 kg)  BMI 26.18 kg/m2  Physical Exam  Nursing note and vitals reviewed. Constitutional: He is oriented to person, place, and time. He appears well-developed and well-nourished.  HENT:  Head: Normocephalic.  Nose: Nose normal.  Mouth/Throat: Oropharynx is clear and moist.  Eyes: Conjunctivae are normal. Pupils are equal, round, and reactive to light.  Neck: Normal range of motion. Neck supple. No JVD present.  Cardiovascular: Normal rate, regular rhythm, S1 normal, S2 normal, normal heart sounds and intact distal pulses.  Exam reveals no gallop and no friction rub.   No murmur heard. Pulmonary/Chest: Effort normal and breath sounds normal. No respiratory distress. He has no wheezes. He has no rales. He exhibits no tenderness.  Abdominal: Soft. Bowel sounds are normal. He exhibits no distension. There is no tenderness.  Musculoskeletal: Normal range of motion. He exhibits no edema and no tenderness.  Lymphadenopathy:    He has no cervical adenopathy.  Neurological: He is alert and oriented to  person, place, and time. Coordination normal.  Skin: Skin is warm and dry. No rash noted. No erythema.  Psychiatric: He has a normal mood and affect. His behavior is normal. Judgment and thought content normal.           Assessment and Plan

## 2012-05-14 LAB — HEPATIC FUNCTION PANEL
ALT: 14 IU/L (ref 0–44)
Albumin: 4.3 g/dL (ref 3.6–4.8)
Total Bilirubin: 0.5 mg/dL (ref 0.0–1.2)

## 2012-05-14 LAB — LIPID PANEL
Chol/HDL Ratio: 4.1 ratio units (ref 0.0–5.0)
LDL Calculated: 57 mg/dL (ref 0–99)

## 2012-05-30 ENCOUNTER — Other Ambulatory Visit: Payer: Self-pay | Admitting: *Deleted

## 2012-05-30 MED ORDER — FAMOTIDINE 40 MG PO TABS: 40.0000 mg | ORAL_TABLET | Freq: Every day | ORAL | Status: DC

## 2012-06-08 ENCOUNTER — Other Ambulatory Visit: Payer: Self-pay | Admitting: *Deleted

## 2012-06-08 MED ORDER — CLOPIDOGREL BISULFATE 75 MG PO TABS: 75.0000 mg | ORAL_TABLET | Freq: Every day | ORAL | Status: DC

## 2012-06-08 NOTE — Telephone Encounter (Signed)
Refilled Clopidogrel. 

## 2012-09-22 IMAGING — CR DG CHEST 2V
1 series · 2 of 2 positions shown · non-contrast
Comparison: none

REASON FOR EXAM: HTN
COMMENTS:   LMP: (Male)

PROCEDURE:     DXR - DXR CHEST PA (OR AP) AND LATERAL  - August 24, 2011  [DATE]
RESULT:     Comparison: None

[Series 1: view not recorded · 0.17mm/px · 2 of 2 slices shown]
[im 1/2]
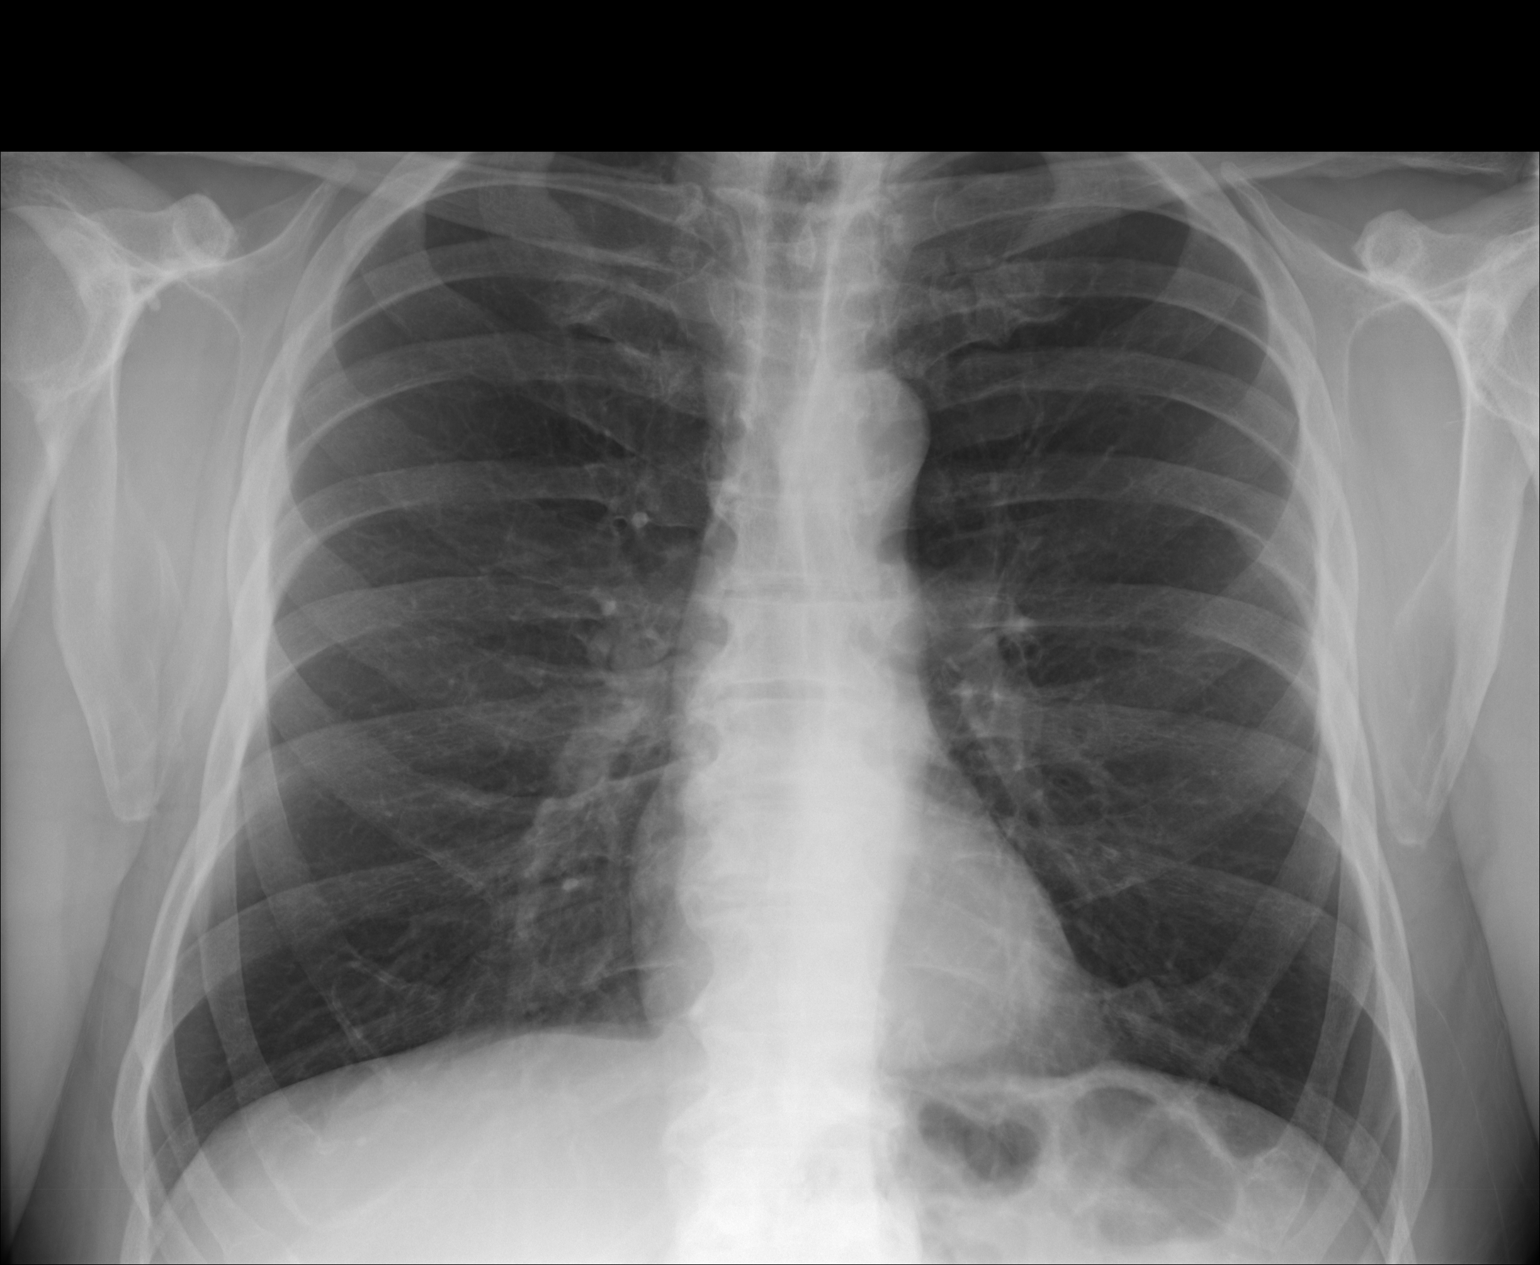
[im 2/2]
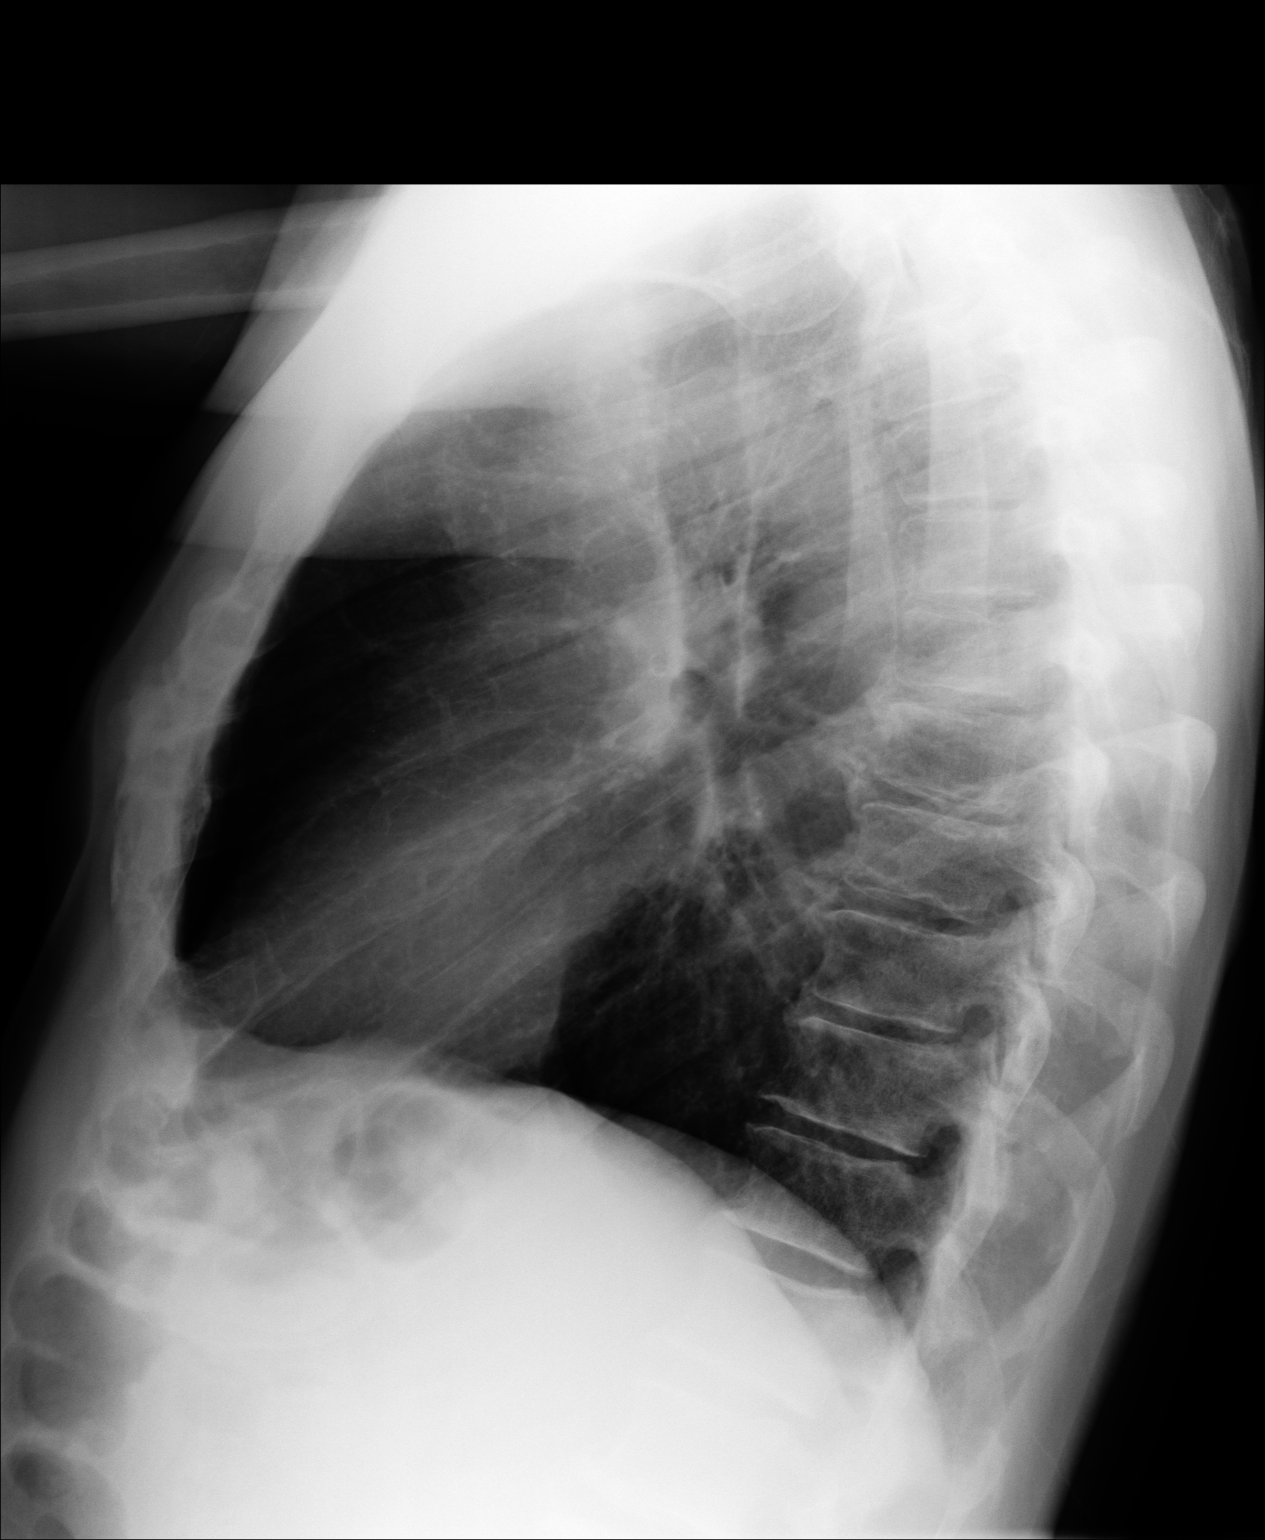

[2 of 2 positions shown; findings below may reference images not displayed]

FINDINGS: PA and lateral chest radiographs are provided.  There is no focal
parenchymal opacity, pleural effusion, or pneumothorax. The heart and
mediastinum are unremarkable.  The osseous structures are unremarkable.
IMPRESSION: No acute disease of the chest.

## 2012-11-11 ENCOUNTER — Encounter: Payer: Self-pay | Admitting: Cardiovascular Disease

## 2012-11-11 ENCOUNTER — Ambulatory Visit (INDEPENDENT_AMBULATORY_CARE_PROVIDER_SITE_OTHER): Payer: BC Managed Care – PPO | Admitting: Cardiovascular Disease

## 2012-11-11 VITALS — BP 128/76 | HR 56 | Ht 72.0 in | Wt 196.8 lb

## 2012-11-11 DIAGNOSIS — I2510 Atherosclerotic heart disease of native coronary artery without angina pectoris: Secondary | ICD-10-CM

## 2012-11-11 DIAGNOSIS — E785 Hyperlipidemia, unspecified: Secondary | ICD-10-CM

## 2012-11-11 DIAGNOSIS — F17200 Nicotine dependence, unspecified, uncomplicated: Secondary | ICD-10-CM

## 2012-11-11 DIAGNOSIS — I10 Essential (primary) hypertension: Secondary | ICD-10-CM

## 2012-11-11 MED ORDER — NITROGLYCERIN 0.4 MG SL SUBL: 0.4000 mg | SUBLINGUAL_TABLET | SUBLINGUAL | Status: DC | PRN

## 2012-11-11 MED ORDER — LISINOPRIL-HYDROCHLOROTHIAZIDE 20-12.5 MG PO TABS: 1.0000 | ORAL_TABLET | Freq: Every day | ORAL | Status: DC

## 2012-11-11 MED ORDER — ISOSORBIDE MONONITRATE ER 30 MG PO TB24: 30.0000 mg | ORAL_TABLET | Freq: Every day | ORAL | Status: DC

## 2012-11-11 MED ORDER — ATORVASTATIN CALCIUM 20 MG PO TABS: 20.0000 mg | ORAL_TABLET | Freq: Every day | ORAL | Status: DC

## 2012-11-11 MED ORDER — CLOPIDOGREL BISULFATE 75 MG PO TABS: 75.0000 mg | ORAL_TABLET | Freq: Every day | ORAL | Status: DC

## 2012-11-11 NOTE — Assessment & Plan Note (Signed)
Cholesterol is at goal on the current lipid regimen. No changes to the medications were made.  

## 2012-11-11 NOTE — Assessment & Plan Note (Signed)
Currently with no symptoms of angina. No further workup at this time. Continue current medication regimen. 

## 2012-11-11 NOTE — Progress Notes (Signed)
Patient ID: [name] [name], male    DOB: 04-19-1946, 66 y.o.   MRN: [id number]  HPI Comments: Mr. 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   He continues to smoke less than one pack per day. He is driving his truck. He was out for less than 2 months with cervical spine surgery February 2013.  He continues to have some neck pain. he denies any significant chest pain or shortness of breath. He plans on retiring next year. He does not do any regular exercise. He does not do any heavy lifting apart from occasionally using a pallet jack, closing the back of his truck. He does have some irritation when turning his head while driving causing some neck pain.  EKG shows sinus bradycardia with rate 56 beats per minute, no significant ST or T wave changes       Outpatient Encounter Prescriptions as of 11/11/2012  Medication Sig Dispense Refill  . aspirin 81 MG EC tablet Take 81 mg by mouth daily.        Marland Kitchen atorvastatin (LIPITOR) 20 MG tablet Take 1 tablet (20 mg total) by mouth daily.  30 tablet  5  . clopidogrel (PLAVIX) 75 MG tablet Take 1 tablet (75 mg total) by mouth daily.  30 tablet  6  . famotidine (PEPCID) 40 MG tablet Take 1 tablet (40 mg total) by mouth daily.  30 tablet  6  . isosorbide mononitrate (IMDUR) 30 MG 24 hr tablet Take 1 tablet (30 mg total) by mouth daily.  30 tablet  6  . lisinopril-hydrochlorothiazide (PRINZIDE,ZESTORETIC) 20-12.5 MG per tablet Take 1 tablet by mouth daily.  30 tablet  11  . metFORMIN (GLUCOPHAGE) 500 MG tablet Take 500 mg by mouth 2 (two) times daily with a meal.        Review of Systems  HENT: Negative.   Eyes:  Negative.   Respiratory: Negative.   Cardiovascular: Negative.   Gastrointestinal: Negative.   Musculoskeletal: Negative.        Chronic Neck pain  Skin: Negative.   Neurological: Negative.   Hematological: Negative.   Psychiatric/Behavioral: Negative.   All other systems reviewed and are negative.   BP 128/76  Pulse 56  Ht 6' (1.829 m)  Wt 196 lb 12.8 oz (89.268 kg)  BMI 26.69 kg/m2  Physical Exam  Nursing note and vitals reviewed. Constitutional: He is oriented to person, place, and time. He appears well-developed and well-nourished.  HENT:  Head: Normocephalic.  Nose: Nose normal.  Mouth/Throat: Oropharynx is clear and moist.  Eyes: Conjunctivae normal are normal. Pupils are equal, round, and reactive to light.  Neck: Normal range of motion. Neck supple. No JVD present.  Cardiovascular: Normal rate, regular rhythm, S1 normal, S2 normal, normal heart sounds and intact distal pulses.  Exam reveals no gallop and no friction rub.   No murmur heard. Pulmonary/Chest: Effort normal and breath sounds normal. No respiratory distress. He has no wheezes. He has no rales. He exhibits no tenderness.  Abdominal: Soft. Bowel sounds are normal. He exhibits no distension. There is no tenderness.  Musculoskeletal: Normal range of motion. He exhibits no edema and no tenderness.  Lymphadenopathy:    He has no cervical  adenopathy.  Neurological: He is alert and oriented to person, place, and time. Coordination normal.  Skin: Skin is warm and dry. No rash noted. No erythema.  Psychiatric: He has a normal mood and affect. His behavior is normal. Judgment and thought content normal.           Assessment and Plan

## 2012-11-11 NOTE — Patient Instructions (Addendum)
You are doing well. No medication changes were made.  Please call us if you have new issues that need to be addressed before your next appt.  Your physician wants you to follow-up in: 6 months.  You will receive a reminder letter in the mail two months in advance. If you don't receive a letter, please call our office to schedule the follow-up appointment.   

## 2012-11-11 NOTE — Assessment & Plan Note (Signed)
Blood pressure is well controlled on today's visit. No changes made to the medications. 

## 2012-11-11 NOTE — Assessment & Plan Note (Signed)
We have encouraged him to continue to work on weaning his cigarettes and smoking cessation. He will continue to work on this and does not want any assistance with chantix.  

## 2013-02-09 ENCOUNTER — Other Ambulatory Visit: Payer: Self-pay

## 2013-02-09 MED ORDER — FAMOTIDINE 40 MG PO TABS: 40.0000 mg | ORAL_TABLET | Freq: Every day | ORAL | Status: DC

## 2013-02-09 NOTE — Telephone Encounter (Signed)
Refill sent for famotidine 40 mg take one tablet daily.

## 2013-05-10 ENCOUNTER — Encounter: Payer: Self-pay | Admitting: Cardiovascular Disease

## 2013-05-10 ENCOUNTER — Ambulatory Visit (INDEPENDENT_AMBULATORY_CARE_PROVIDER_SITE_OTHER): Payer: BC Managed Care – PPO | Admitting: Cardiovascular Disease

## 2013-05-10 VITALS — BP 137/80 | HR 68 | Ht 72.0 in | Wt 189.5 lb

## 2013-05-10 DIAGNOSIS — I2510 Atherosclerotic heart disease of native coronary artery without angina pectoris: Secondary | ICD-10-CM

## 2013-05-10 DIAGNOSIS — F17200 Nicotine dependence, unspecified, uncomplicated: Secondary | ICD-10-CM

## 2013-05-10 DIAGNOSIS — I10 Essential (primary) hypertension: Secondary | ICD-10-CM

## 2013-05-10 DIAGNOSIS — E785 Hyperlipidemia, unspecified: Secondary | ICD-10-CM

## 2013-05-10 NOTE — Assessment & Plan Note (Signed)
Blood pressure is well controlled on today's visit. No changes made to the medications. 

## 2013-05-10 NOTE — Assessment & Plan Note (Signed)
We have given him a lab slip for external lab to have his lipids checked at his convenience.

## 2013-05-10 NOTE — Assessment & Plan Note (Signed)
We have encouraged him to continue to work on weaning his cigarettes and smoking cessation. He will continue to work on this and does not want any assistance with chantix.  

## 2013-05-10 NOTE — Patient Instructions (Addendum)
You are doing well. No medication changes were made.  Please call us if you have new issues that need to be addressed before your next appt.  Your physician wants you to follow-up in: 6 months.  You will receive a reminder letter in the mail two months in advance. If you don't receive a letter, please call our office to schedule the follow-up appointment.   

## 2013-05-10 NOTE — Progress Notes (Signed)
Patient ID: [name] [name], male    DOB: 01-Jun-1946, 67 y.o.   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  He states that he had neck surgery earlier in the year. He continues to have neck pain. He has dropped weight. He has difficulty eating certain foods such as bread. Weight is down 8 pounds from his prior clinic visit. He continues to smoke less than one pack per day. He is driving his truck. he denies any significant chest pain or shortness of breath.  He does not do any regular exercise. He does not do any heavy lifting apart from occasionally using a pallet jack, closing the back of his truck. He does have some irritation when turning his head while driving causing some neck pain.  EKG shows normal sinus rhythm with rate 68 beats per minute beats per minute, no significant ST or T wave changes    Outpatient Encounter Prescriptions as of 05/10/2013  Medication Sig Dispense Refill  . aspirin 81 MG EC tablet Take 81 mg by mouth daily.        Marland Kitchen atorvastatin (LIPITOR) 20 MG tablet Take 1 tablet (20 mg total) by mouth daily.  90 tablet  3  . clopidogrel (PLAVIX) 75 MG tablet Take 1 tablet (75 mg total) by mouth daily.  90 tablet  3  . famotidine (PEPCID) 40 MG tablet Take 1 tablet (40 mg total) by mouth daily.  30 tablet  6  . isosorbide mononitrate (IMDUR) 30 MG 24 hr tablet Take 1 tablet (30 mg total) by mouth daily.  90 tablet  3  . lisinopril-hydrochlorothiazide (PRINZIDE,ZESTORETIC) 20-12.5 MG per tablet Take 1 tablet by mouth daily.  90 tablet  3  . nitroGLYCERIN (NITROSTAT) 0.4 MG SL tablet Place 1 tablet (0.4 mg  total) under the tongue every 5 (five) minutes as needed for chest pain.  25 tablet  6    Review of Systems  HENT: Negative.   Eyes: Negative.   Respiratory: Negative.   Cardiovascular: Negative.   Gastrointestinal: Negative.   Musculoskeletal: Negative.        Chronic Neck pain  Skin: Negative.   Neurological: Negative.   Psychiatric/Behavioral: Negative.   All other systems reviewed and are negative.   BP 137/80  Pulse 68  Ht 6' (1.829 m)  Wt 189 lb 8 oz (85.957 kg)  BMI 25.7 kg/m2  Physical Exam  Nursing note and vitals reviewed. Constitutional: He is oriented to person, place, and time. He appears well-developed and well-nourished.  HENT:  Head: Normocephalic.  Nose: Nose normal.  Mouth/Throat: Oropharynx is clear and moist.  Eyes: Conjunctivae are normal. Pupils are equal, round, and reactive to light.  Neck: Normal range of motion. Neck supple. No JVD present.  Cardiovascular: Normal rate, regular rhythm, S1 normal, S2 normal, normal heart sounds and intact distal pulses.  Exam reveals no gallop and no friction rub.   No murmur heard. Pulmonary/Chest: Effort normal and breath sounds normal. No respiratory distress. He has no wheezes. He has no rales. He exhibits no tenderness.  Abdominal: Soft. Bowel sounds are normal. He exhibits no distension. There is no tenderness.  Musculoskeletal: Normal range of motion.  He exhibits no edema and no tenderness.  Lymphadenopathy:    He has no cervical adenopathy.  Neurological: He is alert and oriented to person, place, and time. Coordination normal.  Skin: Skin is warm and dry. No rash noted. No erythema.  Psychiatric: He has a normal mood and affect. His behavior is normal. Judgment and thought content normal.      Assessment and Plan

## 2013-05-10 NOTE — Assessment & Plan Note (Signed)
Currently with no symptoms of angina. No further workup at this time. Continue current medication regimen. 

## 2013-06-02 ENCOUNTER — Other Ambulatory Visit: Payer: Self-pay | Admitting: *Deleted

## 2013-06-02 MED ORDER — CLOPIDOGREL BISULFATE 75 MG PO TABS: 75.0000 mg | ORAL_TABLET | Freq: Every day | ORAL | Status: DC

## 2013-06-02 NOTE — Telephone Encounter (Signed)
Refilled Clopidogrel sent to [facility].

## 2013-10-02 ENCOUNTER — Other Ambulatory Visit: Payer: Self-pay | Admitting: *Deleted

## 2013-10-02 MED ORDER — FAMOTIDINE 40 MG PO TABS: 40.0000 mg | ORAL_TABLET | Freq: Every day | ORAL | Status: DC

## 2013-10-02 NOTE — Telephone Encounter (Signed)
Requested Prescriptions   Signed Prescriptions Disp Refills  . famotidine (PEPCID) 40 MG tablet 30 tablet 3    Sig: Take 1 tablet (40 mg total) by mouth daily.    Authorizing Provider: Antonieta Iba    Ordering User: Kendrick Fries

## 2013-10-16 ENCOUNTER — Other Ambulatory Visit: Payer: Self-pay | Admitting: *Deleted

## 2013-10-16 MED ORDER — CLOPIDOGREL BISULFATE 75 MG PO TABS: 75.0000 mg | ORAL_TABLET | Freq: Every day | ORAL | Status: DC

## 2013-10-16 NOTE — Telephone Encounter (Signed)
Requested Prescriptions   Signed Prescriptions Disp Refills  . clopidogrel (PLAVIX) 75 MG tablet 30 tablet 3    Sig: Take 1 tablet (75 mg total) by mouth daily.    Authorizing Provider: GOLLAN, TIMOTHY J    Ordering User: Mayline Dragon C    

## 2013-11-13 ENCOUNTER — Ambulatory Visit: Payer: BC Managed Care – PPO | Admitting: Cardiovascular Disease

## 2013-11-23 ENCOUNTER — Ambulatory Visit (INDEPENDENT_AMBULATORY_CARE_PROVIDER_SITE_OTHER): Payer: BC Managed Care – PPO | Admitting: Cardiovascular Disease

## 2013-11-23 ENCOUNTER — Encounter: Payer: Self-pay | Admitting: Cardiovascular Disease

## 2013-11-23 VITALS — BP 112/78 | HR 59 | Ht 72.0 in | Wt 194.8 lb

## 2013-11-23 DIAGNOSIS — I2510 Atherosclerotic heart disease of native coronary artery without angina pectoris: Secondary | ICD-10-CM

## 2013-11-23 DIAGNOSIS — F17200 Nicotine dependence, unspecified, uncomplicated: Secondary | ICD-10-CM

## 2013-11-23 DIAGNOSIS — I10 Essential (primary) hypertension: Secondary | ICD-10-CM

## 2013-11-23 DIAGNOSIS — E785 Hyperlipidemia, unspecified: Secondary | ICD-10-CM

## 2013-11-23 MED ORDER — NITROGLYCERIN 0.4 MG SL SUBL: 0.4000 mg | SUBLINGUAL_TABLET | SUBLINGUAL | Status: DC | PRN

## 2013-11-23 NOTE — Assessment & Plan Note (Signed)
Blood pressure is well controlled on today's visit. No changes made to the medications. 

## 2013-11-23 NOTE — Assessment & Plan Note (Signed)
Cholesterol is at goal on the current lipid regimen. No changes to the medications were made.  

## 2013-11-23 NOTE — Assessment & Plan Note (Signed)
We have encouraged him to continue to work on weaning his cigarettes and smoking cessation. He will continue to work on this and does not want any assistance with chantix.  

## 2013-11-23 NOTE — Progress Notes (Signed)
Patient ID: [name] [name], male    DOB: 1946/02/02, 68 y.o.   MRN: [id number]  HPI Comments: Mr. 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  Status post neck surgery Overall is doing well, very rare tweaking chest pain, not on a regular basis. Otherwise active, continues to smoke less than one pack per day. He is driving his truck. he denies any significant chest pain or shortness of breath.  He does not do any regular exercise. He does not do any heavy lifting apart from occasionally using a pallet jack, closing the back of his truck. He does have some irritation when turning his head while driving causing some neck pain.  EKG shows normal sinus rhythm with rate 59 beats per minute beats per minute, no significant ST or T wave changes    Outpatient Encounter Prescriptions as of 11/23/2013  Medication Sig  . aspirin 81 MG EC tablet Take 81 mg by mouth daily.    Marland Kitchen atorvastatin (LIPITOR) 20 MG tablet Take 1 tablet (20 mg total) by mouth daily.  . clopidogrel (PLAVIX) 75 MG tablet Take 1 tablet (75 mg total) by mouth daily.  . famotidine (PEPCID) 40 MG tablet Take 1 tablet (40 mg total) by mouth daily.  . isosorbide mononitrate (IMDUR) 30 MG 24 hr tablet Take 1 tablet (30 mg total) by mouth daily.  Marland Kitchen lisinopril-hydrochlorothiazide (PRINZIDE,ZESTORETIC) 20-12.5 MG per tablet Take 1 tablet by mouth daily.  . nitroGLYCERIN (NITROSTAT) 0.4 MG SL tablet Place 1 tablet (0.4 mg total) under the tongue every 5 (five) minutes as needed for chest pain.  . [DISCONTINUED] nitroGLYCERIN (NITROSTAT) 0.4 MG SL tablet Place 1 tablet (0.4 mg total) under the tongue every 5  (five) minutes as needed for chest pain.     Review of Systems  HENT: Negative.   Eyes: Negative.   Respiratory: Negative.   Cardiovascular: Negative.   Gastrointestinal: Negative.   Musculoskeletal: Negative.        Chronic Neck pain  Skin: Negative.   Neurological: Negative.   Psychiatric/Behavioral: Negative.   All other systems reviewed and are negative.   BP 112/78  Pulse 59  Ht 6' (1.829 m)  Wt 194 lb 12 oz (88.338 kg)  BMI 26.41 kg/m2  Physical Exam  Nursing note and vitals reviewed. Constitutional: He is oriented to person, place, and time. He appears well-developed and well-nourished.  HENT:  Head: Normocephalic.  Nose: Nose normal.  Mouth/Throat: Oropharynx is clear and moist.  Eyes: Conjunctivae are normal. Pupils are equal, round, and reactive to light.  Neck: Normal range of motion. Neck supple. No JVD present.  Cardiovascular: Normal rate, regular rhythm, S1 normal, S2 normal, normal heart sounds and intact distal pulses.  Exam reveals no gallop and no friction rub.   No murmur heard. Pulmonary/Chest: Effort normal and breath sounds normal. No respiratory distress. He has no wheezes. He has no rales. He exhibits no tenderness.  Abdominal: Soft. Bowel sounds are normal. He exhibits no distension. There is no tenderness.  Musculoskeletal: Normal range of motion. He exhibits no edema and no tenderness.  Lymphadenopathy:    He has no cervical adenopathy.  Neurological: He is alert and oriented to person, place, and time.  Coordination normal.  Skin: Skin is warm and dry. No rash noted. No erythema.  Psychiatric: He has a normal mood and affect. His behavior is normal. Judgment and thought content normal.      Assessment and Plan

## 2013-11-23 NOTE — Assessment & Plan Note (Signed)
Currently with no symptoms of angina. No further workup at this time. Continue current medication regimen. 

## 2013-11-23 NOTE — Patient Instructions (Addendum)
You are doing well. No medication changes were made.  We will check blood work today  Please call us if you have new issues that need to be addressed before your next appt.  Your physician wants you to follow-up in: 6 months.  You will receive a reminder letter in the mail two months in advance. If you don't receive a letter, please call our office to schedule the follow-up appointment.   

## 2013-11-24 LAB — HEPATIC FUNCTION PANEL
ALBUMIN: 4.5 g/dL (ref 3.6–4.8)
ALT: 22 IU/L (ref 0–44)
AST: 24 IU/L (ref 0–40)
Alkaline Phosphatase: 93 IU/L (ref 39–117)
Bilirubin, Direct: 0.15 mg/dL (ref 0.00–0.40)
TOTAL PROTEIN: 6.5 g/dL (ref 6.0–8.5)
Total Bilirubin: 0.6 mg/dL (ref 0.0–1.2)

## 2013-11-24 LAB — LIPID PANEL
CHOL/HDL RATIO: 3.9 ratio (ref 0.0–5.0)
Cholesterol, Total: 145 mg/dL (ref 100–199)
HDL: 37 mg/dL — ABNORMAL LOW (ref >39)
LDL Calculated: 85 mg/dL (ref 0–99)
TRIGLYCERIDES: 116 mg/dL (ref 0–149)
VLDL CHOLESTEROL CAL: 23 mg/dL (ref 5–40)

## 2013-12-06 ENCOUNTER — Other Ambulatory Visit: Payer: Self-pay

## 2013-12-06 MED ORDER — NITROGLYCERIN 0.4 MG SL SUBL: 0.4000 mg | SUBLINGUAL_TABLET | SUBLINGUAL | Status: DC | PRN

## 2013-12-06 MED ORDER — ISOSORBIDE MONONITRATE ER 30 MG PO TB24: 30.0000 mg | ORAL_TABLET | Freq: Every day | ORAL | Status: DC

## 2013-12-06 MED ORDER — FAMOTIDINE 40 MG PO TABS: 40.0000 mg | ORAL_TABLET | Freq: Every day | ORAL | Status: DC

## 2013-12-06 MED ORDER — ATORVASTATIN CALCIUM 40 MG PO TABS: 40.0000 mg | ORAL_TABLET | Freq: Every day | ORAL | Status: DC

## 2013-12-06 MED ORDER — CLOPIDOGREL BISULFATE 75 MG PO TABS: 75.0000 mg | ORAL_TABLET | Freq: Every day | ORAL | Status: DC

## 2013-12-06 MED ORDER — LISINOPRIL-HYDROCHLOROTHIAZIDE 20-12.5 MG PO TABS: 1.0000 | ORAL_TABLET | Freq: Every day | ORAL | Status: DC

## 2013-12-11 ENCOUNTER — Other Ambulatory Visit: Payer: Self-pay | Admitting: Cardiovascular Disease

## 2013-12-11 ENCOUNTER — Other Ambulatory Visit: Payer: Self-pay | Admitting: *Deleted

## 2013-12-11 MED ORDER — ATORVASTATIN CALCIUM 40 MG PO TABS: 40.0000 mg | ORAL_TABLET | Freq: Every day | ORAL | Status: DC

## 2013-12-11 NOTE — Telephone Encounter (Signed)
Requested Prescriptions   Signed Prescriptions Disp Refills  . atorvastatin (LIPITOR) 40 MG tablet 90 tablet 3    Sig: Take 1 tablet (40 mg total) by mouth daily.    Authorizing Provider: [name]    Ordering User: Britt Bottom

## 2014-05-04 ENCOUNTER — Telehealth: Payer: Self-pay | Admitting: Pulmonary Disease

## 2014-05-04 NOTE — Telephone Encounter (Signed)
Entered in error

## 2014-05-25 ENCOUNTER — Ambulatory Visit: Payer: BC Managed Care – PPO | Admitting: Cardiovascular Disease

## 2014-06-01 ENCOUNTER — Encounter: Payer: Self-pay | Admitting: Cardiovascular Disease

## 2014-06-01 ENCOUNTER — Ambulatory Visit (INDEPENDENT_AMBULATORY_CARE_PROVIDER_SITE_OTHER): Payer: BC Managed Care – PPO | Admitting: Cardiovascular Disease

## 2014-06-01 ENCOUNTER — Encounter (INDEPENDENT_AMBULATORY_CARE_PROVIDER_SITE_OTHER): Payer: Self-pay

## 2014-06-01 VITALS — BP 105/64 | HR 56 | Ht 72.0 in | Wt 182.5 lb

## 2014-06-01 DIAGNOSIS — I10 Essential (primary) hypertension: Secondary | ICD-10-CM

## 2014-06-01 DIAGNOSIS — I2510 Atherosclerotic heart disease of native coronary artery without angina pectoris: Secondary | ICD-10-CM

## 2014-06-01 DIAGNOSIS — F17200 Nicotine dependence, unspecified, uncomplicated: Secondary | ICD-10-CM

## 2014-06-01 DIAGNOSIS — R079 Chest pain, unspecified: Secondary | ICD-10-CM

## 2014-06-01 DIAGNOSIS — E785 Hyperlipidemia, unspecified: Secondary | ICD-10-CM

## 2014-06-01 NOTE — Patient Instructions (Addendum)
You are doing well.  Please hold the lisinopril HCTZ Blood pressure is low  Have fun with retirement!!  Please call us if you have new issues that need to be addressed before your next appt.  Your physician wants you to follow-up in: 6 months.  You will receive a reminder letter in the mail two months in advance. If you don't receive a letter, please call our office to schedule the follow-up appointment.

## 2014-06-01 NOTE — Assessment & Plan Note (Signed)
Blood pressures running very low. We have suggested he hold his lisinopril HCTZ

## 2014-06-01 NOTE — Assessment & Plan Note (Signed)
We have encouraged him to continue to work on weaning his cigarettes and smoking cessation. He will continue to work on this and does not want any assistance with chantix.  

## 2014-06-01 NOTE — Assessment & Plan Note (Signed)
Cholesterol is at goal on the current lipid regimen. No changes to the medications were made. Cholesterol likely lower with recent weight loss

## 2014-06-01 NOTE — Assessment & Plan Note (Signed)
Currently with no symptoms of angina. No further workup at this time. Continue current medication regimen. 

## 2014-06-01 NOTE — Progress Notes (Signed)
Patient ID: [name] [name], male    DOB: 02-Apr-1946, 68 y.o.   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  Status post neck surgery In followup today, he has lost significant weight, 12 pounds since his prior clinic visit earlier in 2015. He's had problems with his teeth, new dentures made. He does have occasional episodes of dizziness. Otherwise continues to work. He is retiring next week. Continues to smoke less than one pack per day. No significant chest pain symptoms  He does not do any regular exercise. He does not do any heavy lifting apart from occasionally using a pallet jack, closing the back of his truck.   EKG shows normal sinus rhythm with rate 56 beats per minute beats per minute, no significant ST or T wave changes    Outpatient Encounter Prescriptions as of 06/01/2014  Medication Sig  . aspirin 81 MG EC tablet Take 81 mg by mouth daily.    Marland Kitchen atorvastatin (LIPITOR) 40 MG tablet Take 1 tablet (40 mg total) by mouth daily.  . clopidogrel (PLAVIX) 75 MG tablet Take 1 tablet (75 mg total) by mouth daily.  . famotidine (PEPCID) 40 MG tablet Take 1 tablet (40 mg total) by mouth daily.  . isosorbide mononitrate (IMDUR) 30 MG 24 hr tablet Take 1 tablet (30 mg total) by mouth daily.  Marland Kitchen lisinopril-hydrochlorothiazide (PRINZIDE,ZESTORETIC) 20-12.5 MG per tablet Take 0.5 tablets by mouth daily.  . nitroGLYCERIN (NITROSTAT) 0.4 MG SL tablet Place 1 tablet (0.4 mg total) under the tongue every 5 (five) minutes as needed for chest pain.    Review of Systems  Constitutional: Negative.   HENT: Negative.   Eyes:  Negative.   Respiratory: Negative.   Cardiovascular: Negative.   Gastrointestinal: Negative.   Endocrine: Negative.   Musculoskeletal:       Chronic Neck pain  Skin: Negative.   Allergic/Immunologic: Negative.   Neurological: Negative.   Hematological: Negative.   Psychiatric/Behavioral: Negative.   All other systems reviewed and are negative.  BP 92/64  Pulse 56  Ht 6' (1.829 m)  Wt 182 lb 8 oz (82.781 kg)  BMI 24.75 kg/m2 Repeat blood pressure 094 systolic Physical Exam  Nursing note and vitals reviewed. Constitutional: He is oriented to person, place, and time. He appears well-developed and well-nourished.  HENT:  Head: Normocephalic.  Nose: Nose normal.  Mouth/Throat: Oropharynx is clear and moist.  Eyes: Conjunctivae are normal. Pupils are equal, round, and reactive to light.  Neck: Normal range of motion. Neck supple. No JVD present.  Cardiovascular: Normal rate, regular rhythm, S1 normal, S2 normal, normal heart sounds and intact distal pulses.  Exam reveals no gallop and no friction rub.   No murmur heard. Pulmonary/Chest: Effort normal and breath sounds normal. No respiratory distress. He has no wheezes. He has no rales. He exhibits no tenderness.  Abdominal: Soft. Bowel sounds are normal. He exhibits no distension. There is no tenderness.  Musculoskeletal: Normal range of motion. He exhibits no edema and no tenderness.  Lymphadenopathy:    He has no cervical adenopathy.  Neurological: He is alert and oriented to person, place, and time. Coordination normal.  Skin: Skin is warm  and dry. No rash noted. No erythema.  Psychiatric: He has a normal mood and affect. His behavior is normal. Judgment and thought content normal.      Assessment and Plan

## 2014-07-13 ENCOUNTER — Other Ambulatory Visit: Payer: Self-pay | Admitting: Cardiovascular Disease

## 2014-07-30 ENCOUNTER — Other Ambulatory Visit: Payer: Self-pay | Admitting: Cardiovascular Disease

## 2014-11-15 ENCOUNTER — Other Ambulatory Visit: Payer: Self-pay | Admitting: Cardiovascular Disease

## 2014-12-04 ENCOUNTER — Encounter: Payer: Self-pay | Admitting: Cardiovascular Disease

## 2014-12-04 ENCOUNTER — Ambulatory Visit (INDEPENDENT_AMBULATORY_CARE_PROVIDER_SITE_OTHER): Payer: Medicare Other | Admitting: Cardiovascular Disease

## 2014-12-04 VITALS — BP 104/64 | HR 79 | Ht 72.0 in | Wt 186.2 lb

## 2014-12-04 DIAGNOSIS — I2510 Atherosclerotic heart disease of native coronary artery without angina pectoris: Secondary | ICD-10-CM | POA: Diagnosis not present

## 2014-12-04 DIAGNOSIS — M549 Dorsalgia, unspecified: Secondary | ICD-10-CM | POA: Insufficient documentation

## 2014-12-04 DIAGNOSIS — I10 Essential (primary) hypertension: Secondary | ICD-10-CM | POA: Diagnosis not present

## 2014-12-04 DIAGNOSIS — M545 Low back pain: Secondary | ICD-10-CM | POA: Diagnosis not present

## 2014-12-04 DIAGNOSIS — F17200 Nicotine dependence, unspecified, uncomplicated: Secondary | ICD-10-CM

## 2014-12-04 DIAGNOSIS — Z720 Tobacco use: Secondary | ICD-10-CM | POA: Diagnosis not present

## 2014-12-04 DIAGNOSIS — E785 Hyperlipidemia, unspecified: Secondary | ICD-10-CM | POA: Diagnosis not present

## 2014-12-04 NOTE — Assessment & Plan Note (Signed)
Cholesterol is at goal on the current lipid regimen. No changes to the medications were made.  

## 2014-12-04 NOTE — Progress Notes (Signed)
Patient ID: [name] [name], male    DOB: November 06, 1946, 69 y.o.   MRN: [id number]  HPI Comments: [name] [name] is a 69 year old [name] [name] with coronary artery disease, occluded left circumflex, severely diseased proximal RCA with stent placed December 2009, with no intervention performed on the circumflex secondary to significant collateral circulation, who continues to smoke one pack per day, has hyperlipidemia, episodes of chest pain who works in trucking who presents for routine follow up of his coronary artery disease.   Status post neck surgery, Lost significant weight  He's had problems with his teeth, new dentures made. Previously had  occasional episodes of dizziness. Continues to have dizziness on today's visit, orthostasis Retired, hurt his back building a deck  Continues to smoke less than one pack per day. No significant chest pain symptoms  EKG on today's visit shows normal sinus rhythm with right bundle branch block, nonspecific ST abnormality, rate 79 bpm   No Known Allergies  Outpatient Encounter Prescriptions as of 12/04/2014  Medication Sig  . aspirin 81 MG EC tablet Take 81 mg by mouth daily.    Marland Kitchen atorvastatin (LIPITOR) 40 MG tablet Take 1 tablet (40 mg total) by mouth daily.  . clopidogrel (PLAVIX) 75 MG tablet TAKE ONE TABLET BY MOUTH EVERY DAY  . famotidine (PEPCID) 40 MG tablet TAKE ONE TABLET BY MOUTH EVERY DAY  . isosorbide mononitrate (IMDUR) 30 MG 24 hr tablet Take 1 tablet (30 mg total) by mouth daily.  . nitroGLYCERIN (NITROSTAT) 0.4 MG SL tablet Place 1 tablet (0.4 mg total) under the tongue every 5 (five) minutes as needed for chest pain.    Past Medical History  Diagnosis Date  . Coronary artery disease   . Bradycardia   . Hypertension   . Hyperlipidemia   . Nephrolithiasis     Past Surgical History  Procedure Laterality Date  . Cataract extraction    . Kidney stone surgery    . Stent (other)    . Nephrolithiasis    . Neck surgery  Feb 2013     Social History  reports that he has been smoking Cigarettes.  He has a 36 pack-year smoking history. He has never used smokeless tobacco. He reports that he does not drink alcohol or use illicit drugs.  Family History Family history is unknown by patient.        Review of Systems  Constitutional: Negative.   HENT: Negative.   Eyes: Negative.   Respiratory: Negative.   Cardiovascular: Negative.   Gastrointestinal: Negative.   Endocrine: Negative.   Musculoskeletal:       Chronic Neck pain  Skin: Negative.   Neurological: Negative.   Hematological: Negative.   Psychiatric/Behavioral: Negative.   All other systems reviewed and are negative.  BP 104/64 mmHg  Pulse 79  Ht 6' (1.829 m)  Wt 186 lb 4 oz (84.482 kg)  BMI 25.25 kg/m2  Physical Exam  Constitutional: He is oriented to person, place, and time. He appears well-developed and well-nourished.  HENT:  Head: Normocephalic.  Nose: Nose normal.  Mouth/Throat: Oropharynx is clear and moist.  Eyes: Conjunctivae are normal. Pupils are equal, round, and reactive to light.  Neck: Normal range of motion. Neck supple. No JVD present.  Cardiovascular: Normal rate, regular rhythm, S1 normal, S2 normal, normal heart sounds and intact distal pulses.  Exam reveals no gallop and no friction rub.   No murmur heard. Pulmonary/Chest: Effort normal and breath sounds normal. No respiratory distress. He has no wheezes.  He has no rales. He exhibits no tenderness.  Abdominal: Soft. Bowel sounds are normal. He exhibits no distension. There is no tenderness.  Musculoskeletal: Normal range of motion. He exhibits no edema or tenderness.  Lymphadenopathy:    He has no cervical adenopathy.  Neurological: He is alert and oriented to person, place, and time. Coordination normal.  Skin: Skin is warm and dry. No rash noted. No erythema.  Psychiatric: He has a normal mood and affect. His behavior is normal. Judgment and thought content normal.       Assessment and Plan   Nursing note and vitals reviewed.

## 2014-12-04 NOTE — Assessment & Plan Note (Signed)
Currently with no symptoms of angina. No further workup at this time. Continue current medication regimen. High risk of recurrent coronary disease given his continued smoking. This was discussed with him

## 2014-12-04 NOTE — Patient Instructions (Signed)
You are doing well.  Please cut the isosorbide in 1/2 daily  Please call us if you have new issues that need to be addressed before your next appt.  Your physician wants you to follow-up in: 12 months.  You will receive a reminder letter in the mail two months in advance. If you don't receive a letter, please call our office to schedule the follow-up appointment.

## 2014-12-04 NOTE — Assessment & Plan Note (Signed)
Blood pressure running low after recent weight loss. Suggested he cut his isosorbide down to 15 mg daily

## 2014-12-04 NOTE — Assessment & Plan Note (Signed)
He hurt his back after building his back. Continues to give him trouble. Suggested he follow-up with primary care

## 2014-12-04 NOTE — Assessment & Plan Note (Signed)
We have encouraged him to continue to work on weaning his cigarettes and smoking cessation. He will continue to work on this and does not want any assistance with chantix.  

## 2014-12-11 DIAGNOSIS — I2510 Atherosclerotic heart disease of native coronary artery without angina pectoris: Secondary | ICD-10-CM | POA: Diagnosis not present

## 2014-12-11 DIAGNOSIS — Z0001 Encounter for general adult medical examination with abnormal findings: Secondary | ICD-10-CM | POA: Diagnosis not present

## 2014-12-11 DIAGNOSIS — B3742 Candidal balanitis: Secondary | ICD-10-CM | POA: Diagnosis not present

## 2014-12-11 DIAGNOSIS — E784 Other hyperlipidemia: Secondary | ICD-10-CM | POA: Diagnosis not present

## 2014-12-11 DIAGNOSIS — N472 Paraphimosis: Secondary | ICD-10-CM | POA: Diagnosis not present

## 2014-12-14 ENCOUNTER — Ambulatory Visit: Payer: Self-pay | Admitting: Internal Medicine

## 2014-12-14 DIAGNOSIS — M47894 Other spondylosis, thoracic region: Secondary | ICD-10-CM | POA: Diagnosis not present

## 2014-12-14 DIAGNOSIS — K760 Fatty (change of) liver, not elsewhere classified: Secondary | ICD-10-CM | POA: Diagnosis not present

## 2014-12-14 DIAGNOSIS — J432 Centrilobular emphysema: Secondary | ICD-10-CM | POA: Diagnosis not present

## 2014-12-14 DIAGNOSIS — I2510 Atherosclerotic heart disease of native coronary artery without angina pectoris: Secondary | ICD-10-CM | POA: Diagnosis not present

## 2014-12-14 DIAGNOSIS — F17200 Nicotine dependence, unspecified, uncomplicated: Secondary | ICD-10-CM | POA: Diagnosis not present

## 2014-12-14 DIAGNOSIS — J439 Emphysema, unspecified: Secondary | ICD-10-CM | POA: Diagnosis not present

## 2014-12-14 DIAGNOSIS — R634 Abnormal weight loss: Secondary | ICD-10-CM | POA: Diagnosis not present

## 2014-12-20 ENCOUNTER — Emergency Department: Payer: Self-pay | Admitting: Emergency Medicine

## 2014-12-20 DIAGNOSIS — I10 Essential (primary) hypertension: Secondary | ICD-10-CM | POA: Diagnosis not present

## 2014-12-20 DIAGNOSIS — Z720 Tobacco use: Secondary | ICD-10-CM | POA: Diagnosis not present

## 2014-12-20 DIAGNOSIS — J441 Chronic obstructive pulmonary disease with (acute) exacerbation: Secondary | ICD-10-CM | POA: Diagnosis not present

## 2014-12-20 DIAGNOSIS — E860 Dehydration: Secondary | ICD-10-CM | POA: Diagnosis not present

## 2014-12-20 DIAGNOSIS — E1165 Type 2 diabetes mellitus with hyperglycemia: Secondary | ICD-10-CM | POA: Diagnosis not present

## 2014-12-20 DIAGNOSIS — R0602 Shortness of breath: Secondary | ICD-10-CM | POA: Diagnosis not present

## 2014-12-20 DIAGNOSIS — R918 Other nonspecific abnormal finding of lung field: Secondary | ICD-10-CM | POA: Diagnosis not present

## 2014-12-20 DIAGNOSIS — R531 Weakness: Secondary | ICD-10-CM | POA: Diagnosis not present

## 2014-12-20 DIAGNOSIS — K760 Fatty (change of) liver, not elsewhere classified: Secondary | ICD-10-CM | POA: Diagnosis not present

## 2014-12-20 LAB — BASIC METABOLIC PANEL
Anion Gap: 16 (ref 7–16)
BUN: 16 mg/dL (ref 7–18)
CHLORIDE: 100 mmol/L (ref 98–107)
CREATININE: 1.05 mg/dL (ref 0.60–1.30)
Calcium, Total: 8.6 mg/dL (ref 8.5–10.1)
Co2: 19 mmol/L — ABNORMAL LOW (ref 21–32)
EGFR (African American): >60
Glucose: 290 mg/dL — ABNORMAL HIGH (ref 65–99)
OSMOLALITY: 282 (ref 275–301)
Potassium: 4.5 mmol/L (ref 3.5–5.1)
Sodium: 135 mmol/L — ABNORMAL LOW (ref 136–145)

## 2014-12-20 LAB — CBC WITH DIFFERENTIAL/PLATELET
BASOS ABS: 0.1 10*3/uL (ref 0.0–0.1)
Basophil %: 1.5 %
Eosinophil #: 0.1 10*3/uL (ref 0.0–0.7)
Eosinophil %: 1.2 %
HCT: 46.1 % (ref 40.0–52.0)
HGB: 15.3 g/dL (ref 13.0–18.0)
LYMPHS ABS: 1.6 10*3/uL (ref 1.0–3.6)
Lymphocyte %: 15.8 %
MCH: 30.7 pg (ref 26.0–34.0)
MCHC: 33.2 g/dL (ref 32.0–36.0)
MCV: 92 fL (ref 80–100)
Monocyte #: 0.8 x10 3/mm (ref 0.2–1.0)
Monocyte %: 7.7 %
Neutrophil #: 7.3 10*3/uL — ABNORMAL HIGH (ref 1.4–6.5)
Neutrophil %: 73.8 %
PLATELETS: 163 10*3/uL (ref 150–440)
RBC: 4.99 10*6/uL (ref 4.40–5.90)
RDW: 13.3 % (ref 11.5–14.5)
WBC: 9.9 10*3/uL (ref 3.8–10.6)

## 2014-12-20 LAB — URINALYSIS, COMPLETE
Bacteria: NONE SEEN
Bilirubin,UR: NEGATIVE
Blood: NEGATIVE
Glucose,UR: >=500 mg/dL (ref 0–75)
Hyaline Cast: 1
Leukocyte Esterase: NEGATIVE
NITRITE: NEGATIVE
PROTEIN: NEGATIVE
Ph: 5 (ref 4.5–8.0)
Specific Gravity: 1.036 (ref 1.003–1.030)
Squamous Epithelial: <1
WBC UR: 1 /HPF (ref 0–5)

## 2014-12-20 LAB — PRO B NATRIURETIC PEPTIDE: B-Type Natriuretic Peptide: 21 pg/mL (ref 0–125)

## 2014-12-20 LAB — TROPONIN I: Troponin-I: <0.02 ng/mL

## 2014-12-21 ENCOUNTER — Other Ambulatory Visit: Payer: Self-pay | Admitting: Cardiovascular Disease

## 2014-12-28 DIAGNOSIS — J441 Chronic obstructive pulmonary disease with (acute) exacerbation: Secondary | ICD-10-CM | POA: Diagnosis not present

## 2014-12-28 DIAGNOSIS — E1165 Type 2 diabetes mellitus with hyperglycemia: Secondary | ICD-10-CM | POA: Diagnosis not present

## 2014-12-28 DIAGNOSIS — N472 Paraphimosis: Secondary | ICD-10-CM | POA: Diagnosis not present

## 2014-12-28 DIAGNOSIS — E119 Type 2 diabetes mellitus without complications: Secondary | ICD-10-CM | POA: Diagnosis not present

## 2015-01-08 ENCOUNTER — Other Ambulatory Visit: Payer: Self-pay | Admitting: Cardiovascular Disease

## 2015-01-08 NOTE — Telephone Encounter (Signed)
Refill sent for atorvastatin. 

## 2015-01-08 NOTE — Telephone Encounter (Signed)
Pt is calling about a refill needed on atorvastatin for patient is out starting today.  Please call pt when done. This needs to go to [facility].

## 2015-01-11 DIAGNOSIS — J349 Unspecified disorder of nose and nasal sinuses: Secondary | ICD-10-CM | POA: Diagnosis not present

## 2015-01-11 DIAGNOSIS — J439 Emphysema, unspecified: Secondary | ICD-10-CM | POA: Diagnosis not present

## 2015-01-11 DIAGNOSIS — E1165 Type 2 diabetes mellitus with hyperglycemia: Secondary | ICD-10-CM | POA: Diagnosis not present

## 2015-01-11 DIAGNOSIS — J449 Chronic obstructive pulmonary disease, unspecified: Secondary | ICD-10-CM | POA: Diagnosis not present

## 2015-01-11 DIAGNOSIS — N472 Paraphimosis: Secondary | ICD-10-CM | POA: Diagnosis not present

## 2015-03-10 NOTE — Op Note (Signed)
PATIENT NAME:  [name], [name] MR#:  [id number] DATE OF BIRTH:  02-Sep-1946  DATE OF PROCEDURE:  01/05/2012  PREOPERATIVE DIAGNOSIS: Cervical spondylosis with radiculopathy.   POSTOPERATIVE DIAGNOSIS: Cervical spondylosis with radiculopathy.   PROCEDURES:  1. Anterior cervical diskectomy and fusion with removal of posterior osteophyte, C3-C4.  2. Anterior cervical diskectomy and fusion with removal of posterior osteophytes, C6-C7.  3. Insertion of interbody device, C3-C4.  4. Insertion of interbody device, W2-B7.  5. Application of anterior plate, C3-C4.  6. Application of anterior plate, C6-C7.   SURGEON: Albesa Seen, M.D.   ASSISTANT: Reche Dixon, PA-C  ANESTHESIA: General.   ESTIMATED BLOOD LOSS: 50 mL.  REPLACEMENT: 1500 mL of crystalloid.   DRAINS: One Hemovac.   COMPLICATIONS: None.   IMPLANTS USED: Zimmer trabecular metal 6 mm Ardis implant at C3-C4, 5 mm at C6-C7, Trinica plates, CopiOs bone void filler.  BRIEF CLINICAL NOTE AND PATHOLOGY: The patient had persistent neck pain with radiculopathy. Options, risks, and benefits were discussed and he elected to proceed with operative intervention. At the time of the procedure, there was very significant foraminal stenosis.   DESCRIPTION OF PROCEDURE: Preop antibiotics, adequate general anesthesia, supine position, routine prepping and draping after the shoulder positioner was used.   The skin was marked appropriately.   The C3-C4 disk was approached first by making a left-sided incision, subcutaneous tissues were dissected, platysma was divided and the interval carefully developed down to the anterior cervical spine. Trachea and esophagus were retracted to the right side. A self-retaining retractor was used. Lateral fluoroscopy was used to confirm the appropriate level. Distraction pins were then placed using fluoroscopic guidance and distraction was performed. The anterior annulus was excised. Diskectomy was performed and the  microscope was brought onto the field. Posterior osteophytes and posterior longitudinal ligament were removed. The endplates were carefully scraped. It was felt that decompression was satisfactory and sizing was performed. The rasp was used. The incision was irrigated and the 6 mm trabecular metal Ardis implant was then inserted. It seated very nicely.   AP and lateral views showed good position.   The anterior plate was then applied in routine fashion using fluoroscopic guidance.   Attention was then turned to C6-C7 where the same procedure was performed. Again, there was significant posterior osteophytes which were removed with the 45 degree Kerrison. Sizing was performed and the implant was inserted, again it was extremely secure. The anterior plate was applied in routine fashion. AP and lateral views showed good positioning. Locking mechanisms were engaged in the plates. The incision was thoroughly irrigated. Surgiflo was used. A Hemovac drain was placed. The subcutaneous was closed with 2-0 Vicryl after the platysma was approximated. The skin was closed with subcuticular Vicryl and skin glue. A soft sterile dressing was applied. A Hemovac drain had been used. Sponge and needle counts were reported as correct prior to and after wound closure. The patient was awakened and taken to the PAC-U having tolerated the procedure well.  ____________________________ Alysia Penna. Mauri Pole, MD jcc:slb D: 01/07/2012 10:39:50 ET     T: 01/07/2012 11:33:44 ET        JOB#: [id number] Alysia Penna Olaoluwa Grieder MD ELECTRONICALLY SIGNED 01/11/2012 13:33

## 2015-03-12 DIAGNOSIS — E1165 Type 2 diabetes mellitus with hyperglycemia: Secondary | ICD-10-CM | POA: Diagnosis not present

## 2015-03-12 DIAGNOSIS — K760 Fatty (change of) liver, not elsewhere classified: Secondary | ICD-10-CM | POA: Diagnosis not present

## 2015-03-12 DIAGNOSIS — Z1211 Encounter for screening for malignant neoplasm of colon: Secondary | ICD-10-CM | POA: Diagnosis not present

## 2015-03-12 DIAGNOSIS — J439 Emphysema, unspecified: Secondary | ICD-10-CM | POA: Diagnosis not present

## 2015-03-12 DIAGNOSIS — E119 Type 2 diabetes mellitus without complications: Secondary | ICD-10-CM | POA: Diagnosis not present

## 2015-04-02 ENCOUNTER — Other Ambulatory Visit: Payer: Self-pay | Admitting: Cardiovascular Disease

## 2015-04-24 ENCOUNTER — Other Ambulatory Visit: Payer: Self-pay | Admitting: Cardiovascular Disease

## 2015-06-11 DIAGNOSIS — J439 Emphysema, unspecified: Secondary | ICD-10-CM | POA: Diagnosis not present

## 2015-06-11 DIAGNOSIS — E119 Type 2 diabetes mellitus without complications: Secondary | ICD-10-CM | POA: Diagnosis not present

## 2015-06-11 DIAGNOSIS — E1165 Type 2 diabetes mellitus with hyperglycemia: Secondary | ICD-10-CM | POA: Diagnosis not present

## 2015-06-11 DIAGNOSIS — K760 Fatty (change of) liver, not elsewhere classified: Secondary | ICD-10-CM | POA: Diagnosis not present

## 2015-06-11 DIAGNOSIS — J449 Chronic obstructive pulmonary disease, unspecified: Secondary | ICD-10-CM | POA: Diagnosis not present

## 2015-06-11 DIAGNOSIS — I2510 Atherosclerotic heart disease of native coronary artery without angina pectoris: Secondary | ICD-10-CM | POA: Diagnosis not present

## 2015-06-25 ENCOUNTER — Other Ambulatory Visit: Payer: Self-pay | Admitting: Cardiovascular Disease

## 2015-08-28 ENCOUNTER — Other Ambulatory Visit: Payer: Self-pay | Admitting: Cardiovascular Disease

## 2015-10-30 ENCOUNTER — Other Ambulatory Visit: Payer: Self-pay | Admitting: Cardiovascular Disease

## 2015-12-09 ENCOUNTER — Ambulatory Visit (INDEPENDENT_AMBULATORY_CARE_PROVIDER_SITE_OTHER): Payer: Medicare Other | Admitting: Cardiovascular Disease

## 2015-12-09 ENCOUNTER — Encounter: Payer: Self-pay | Admitting: Cardiovascular Disease

## 2015-12-09 VITALS — BP 134/72 | HR 64 | Ht 72.0 in | Wt 205.5 lb

## 2015-12-09 DIAGNOSIS — I10 Essential (primary) hypertension: Secondary | ICD-10-CM | POA: Diagnosis not present

## 2015-12-09 DIAGNOSIS — I2510 Atherosclerotic heart disease of native coronary artery without angina pectoris: Secondary | ICD-10-CM

## 2015-12-09 DIAGNOSIS — F17200 Nicotine dependence, unspecified, uncomplicated: Secondary | ICD-10-CM | POA: Diagnosis not present

## 2015-12-09 DIAGNOSIS — E785 Hyperlipidemia, unspecified: Secondary | ICD-10-CM

## 2015-12-09 NOTE — Progress Notes (Signed)
Patient ID: [name] [name], male    DOB: 1946-01-14, 70 y.o.   MRN: [id number]  HPI Comments: [name] [name] is a 70 year old gentleman with coronary artery disease, occluded left circumflex, severely diseased proximal RCA with stent placed December 2009, with no intervention performed on the circumflex secondary to significant collateral circulation, who continues to smoke one pack per day, has hyperlipidemia, episodes of chest pain who previously worked in [place], now retired, who presents for routine follow up of his coronary artery disease.   In follow-up today, he is enjoying retired life Continues to smoke but trying to quit Reports having hospital admission febrile 2016 for fatigue found to have glucose levels more than 400 He was drinking lots of soda at the time Now changed his diet, no soda, taking metformin 5 mg twice a day with improved glucose levels managed by primary care Denies having any significant chest pain concerning for angina. 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     No Known Allergies  Outpatient Encounter Prescriptions as of 68/23/2017  Medication Sig  . aspirin 81 MG EC tablet Take 81 mg by mouth daily.    Marland Kitchen atorvastatin (LIPITOR) 40 MG tablet Take 1 tablet (40 mg total) by mouth daily.  . clopidogrel (PLAVIX) 75 MG tablet TAKE ONE TABLET BY MOUTH EVERY DAY  . famotidine (PEPCID) 40 MG tablet TAKE ONE TABLET BY MOUTH EVERY DAY  . isosorbide mononitrate (IMDUR) 30 MG 24 hr tablet TAKE ONE TABLET BY MOUTH EVERY DAY  . metFORMIN (GLUCOPHAGE) 500 MG tablet Take by mouth 2 (two) times daily with a meal.  . nitroGLYCERIN (NITROSTAT) 0.4 MG SL tablet Place 1 tablet (0.4 mg total) under the tongue every 5 (five) minutes as needed for chest  pain.  . [DISCONTINUED] atorvastatin (LIPITOR) 40 MG tablet TAKE ONE TABLET BY MOUTH EVERY DAY (Patient not taking: Reported on 12/09/2015)   No facility-administered encounter medications on file as of 12/09/2015.    Past Medical History  Diagnosis Date  . Coronary artery disease   . Bradycardia   . Hypertension   . Hyperlipidemia   . Nephrolithiasis   . Diabetes mellitus without complication [facility])     Past Surgical History  Procedure Laterality Date  . Cataract extraction    . Kidney stone surgery    . Stent (other)    . Nephrolithiasis    . Neck surgery  Feb 2013    Social History  reports that he has been smoking Cigarettes.  He has a 36 pack-year smoking history. He has never used smokeless tobacco. He reports that he does not drink alcohol or use illicit drugs.  Family History Family history is unknown by patient.        Review of Systems  Constitutional: Negative.   Respiratory: Negative.   Cardiovascular: Negative.   Gastrointestinal: Negative.   Musculoskeletal:       Chronic Neck pain  Neurological: Negative.   Hematological: Negative.   All other systems reviewed and are negative.  BP 134/72 mmHg  Pulse 64  Ht 6' (1.829 m)  Wt 205 lb 8 oz (93.214 kg)  BMI 27.86 kg/m2  Physical Exam  Constitutional: He is oriented to person, place, and time. He appears well-developed and well-nourished.  HENT:  Head: Normocephalic.  Nose: Nose normal.  Mouth/Throat: Oropharynx is clear and moist.  Eyes: Conjunctivae are normal. Pupils are equal, round, and reactive to light.  Neck: Normal range of motion. Neck supple. No JVD present.  Cardiovascular: Normal rate, regular rhythm, S1 normal, S2 normal and intact distal pulses.  Exam reveals no gallop and no friction rub.   Murmur heard.  Systolic murmur is present with a grade of 1/6  Pulmonary/Chest: Effort normal and breath sounds normal. No respiratory distress. He has no wheezes. He has no rales. He exhibits  no tenderness.  Abdominal: Soft. Bowel sounds are normal. He exhibits no distension. There is no tenderness.  Musculoskeletal: Normal range of motion. He exhibits no edema or tenderness.  Lymphadenopathy:    He has no cervical adenopathy.  Neurological: He is alert and oriented to person, place, and time. Coordination normal.  Skin: Skin is warm and dry. No rash noted. No erythema.  Psychiatric: He has a normal mood and affect. His behavior is normal. Judgment and thought content normal.      Assessment and Plan   Nursing note and vitals reviewed.

## 2015-12-09 NOTE — Patient Instructions (Signed)
You are doing well. No medication changes were made.  Please call us if you have new issues that need to be addressed before your next appt.  Your physician wants you to follow-up in: 12 months.  You will receive a reminder letter in the mail two months in advance. If you don't receive a letter, please call our office to schedule the follow-up appointment. 

## 2015-12-09 NOTE — Assessment & Plan Note (Signed)
Blood pressure is well controlled on today's visit. No changes made to the medications. 

## 2015-12-09 NOTE — Assessment & Plan Note (Signed)
We have encouraged him to continue to work on weaning his cigarettes and smoking cessation. He will continue to work on this and does not want any assistance with chantix.  

## 2015-12-09 NOTE — Assessment & Plan Note (Signed)
Cholesterol is at goal on the current lipid regimen. No changes to the medications were made.  

## 2015-12-09 NOTE — Assessment & Plan Note (Signed)
Currently with no symptoms of angina. No further workup at this time. Continue current medication regimen. 

## 2015-12-30 ENCOUNTER — Other Ambulatory Visit: Payer: Self-pay | Admitting: Cardiovascular Disease

## 2016-01-19 IMAGING — CR DG CHEST 2V
1 series · 3 of 3 positions shown · non-contrast
Comparison: [DATE]

CLINICAL DATA: Weakness, shortness of Breath

EXAM:
CHEST  2 VIEW

[Series 1: dxr chest pa (or ap) and lateral · 0.14mm/px · 3 of 3 slices shown]
[im 1/3]
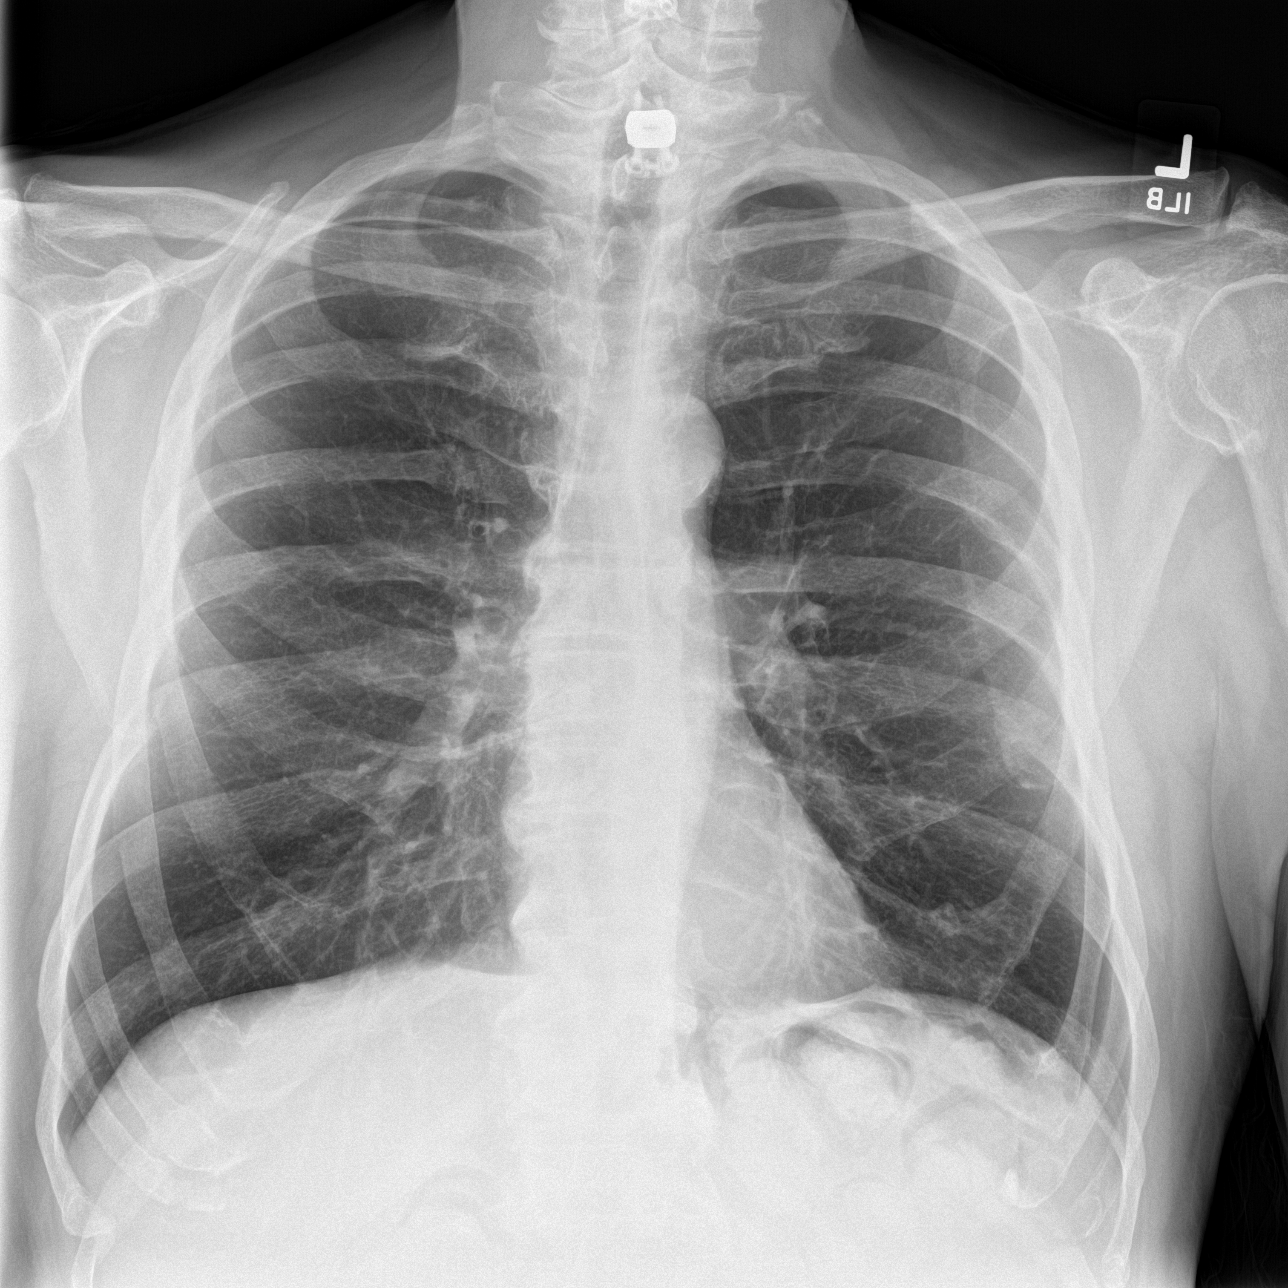
[im 2/3]
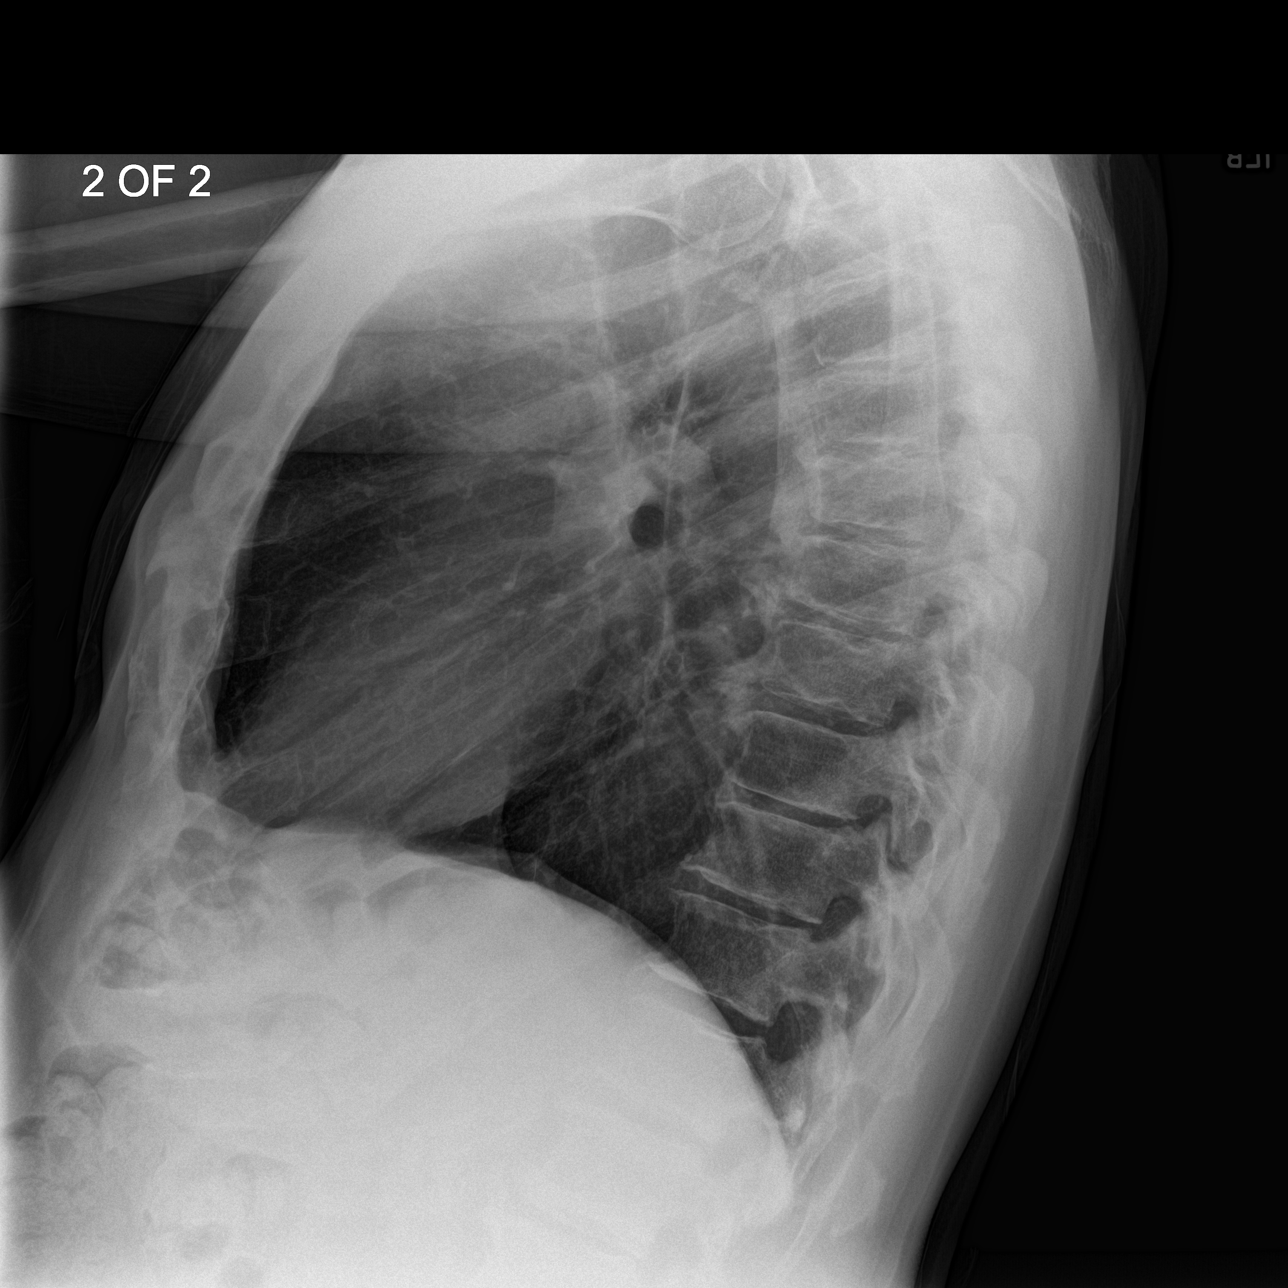
[im 3/3]
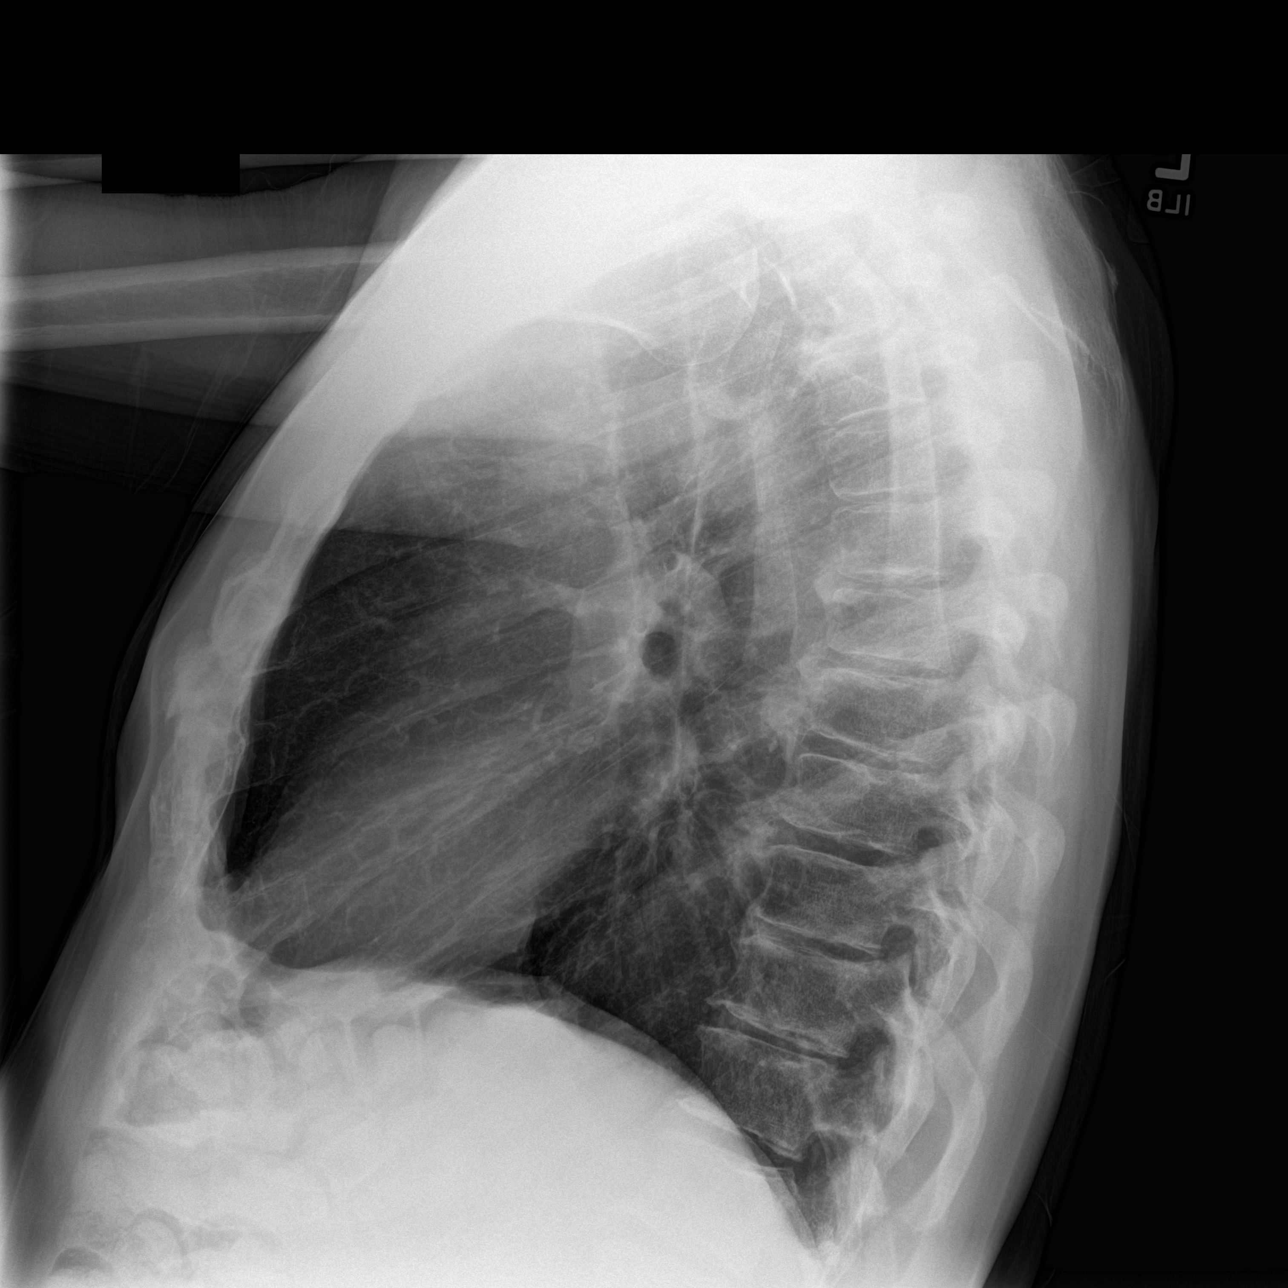

[3 of 3 positions shown; findings below may reference images not displayed]

FINDINGS: Cardiomediastinal silhouette is stable. Mild hyperinflation. No
acute infiltrate or pleural effusion. No pulmonary edema. Mild
degenerative changes thoracic spine.
IMPRESSION: No active cardiopulmonary disease.  Mild hyperinflation.

## 2016-01-23 ENCOUNTER — Other Ambulatory Visit: Payer: Self-pay | Admitting: Cardiovascular Disease

## 2016-02-24 ENCOUNTER — Other Ambulatory Visit: Payer: Self-pay | Admitting: Cardiovascular Disease

## 2016-08-10 ENCOUNTER — Other Ambulatory Visit: Payer: Self-pay | Admitting: *Deleted

## 2016-08-10 MED ORDER — ISOSORBIDE MONONITRATE ER 30 MG PO TB24: 30.0000 mg | ORAL_TABLET | Freq: Every day | ORAL | 3 refills | Status: DC

## 2016-08-11 ENCOUNTER — Other Ambulatory Visit: Payer: Self-pay | Admitting: *Deleted

## 2016-08-11 MED ORDER — FAMOTIDINE 40 MG PO TABS: 40.0000 mg | ORAL_TABLET | Freq: Every day | ORAL | 3 refills | Status: DC

## 2016-10-13 ENCOUNTER — Telehealth: Payer: Self-pay | Admitting: Cardiovascular Disease

## 2016-10-13 ENCOUNTER — Other Ambulatory Visit: Payer: Self-pay | Admitting: *Deleted

## 2016-10-13 MED ORDER — CLOPIDOGREL BISULFATE 75 MG PO TABS: 75.0000 mg | ORAL_TABLET | Freq: Every day | ORAL | 3 refills | Status: DC

## 2016-10-13 NOTE — Telephone Encounter (Signed)
°*  STAT* If patient is at the pharmacy, call can be transferred to refill team.   1. Which medications need to be refilled? (please list name of each medication and dose if known) Plavix   2. Which pharmacy/location (including street and city if local pharmacy) is medication to be sent to?[facility]   3. Do they need a 30 day or 90 day supply? 90 day

## 2016-10-13 NOTE — Telephone Encounter (Signed)
Requested Prescriptions   Signed Prescriptions Disp Refills  . clopidogrel (PLAVIX) 75 MG tablet 90 tablet 3    Sig: Take 1 tablet (75 mg total) by mouth daily.    Authorizing Provider: GOLLAN, TIMOTHY J    Ordering User: LOPEZ, MARINA C    

## 2016-12-08 ENCOUNTER — Ambulatory Visit (INDEPENDENT_AMBULATORY_CARE_PROVIDER_SITE_OTHER): Payer: Medicare Other | Admitting: Cardiovascular Disease

## 2016-12-08 ENCOUNTER — Encounter: Payer: Self-pay | Admitting: Cardiovascular Disease

## 2016-12-08 VITALS — BP 120/72 | HR 71 | Ht 72.0 in | Wt 197.5 lb

## 2016-12-08 DIAGNOSIS — E1100 Type 2 diabetes mellitus with hyperosmolarity without nonketotic hyperglycemic-hyperosmolar coma (NKHHC): Secondary | ICD-10-CM | POA: Diagnosis not present

## 2016-12-08 DIAGNOSIS — E782 Mixed hyperlipidemia: Secondary | ICD-10-CM | POA: Diagnosis not present

## 2016-12-08 DIAGNOSIS — I2510 Atherosclerotic heart disease of native coronary artery without angina pectoris: Secondary | ICD-10-CM | POA: Diagnosis not present

## 2016-12-08 DIAGNOSIS — F17200 Nicotine dependence, unspecified, uncomplicated: Secondary | ICD-10-CM

## 2016-12-08 DIAGNOSIS — I10 Essential (primary) hypertension: Secondary | ICD-10-CM

## 2016-12-08 NOTE — Patient Instructions (Addendum)
[facility] Dr. Bynum Bellows [address] [place], [place] [address] [phone]  Medication Instructions:   No medication changes made  Labwork:  No new labs needed  Testing/Procedures:  No further testing at this time   I recommend watching educational videos on topics of interest to you at:       [email or web address]  Enter code: HEARTCARE    Follow-Up: It was a pleasure seeing you in the office today. Please call us if you have new issues that need to be addressed before your next appt.  [phone]  Your physician wants you to follow-up in: 6 months.  You will receive a reminder letter in the mail two months in advance. If you don't receive a letter, please call our office to schedule the follow-up appointment.  If you need a refill on your cardiac medications before your next appointment, please call your pharmacy.

## 2016-12-08 NOTE — Progress Notes (Signed)
Cardiology Office Note  Date:  12/08/2016   ID:  [name] [name] [name] [name], DOB Mar 08, 1946, MRN [id number]  PCP:  [name] [place], MD   Chief Complaint  Patient presents with  . other    63mo f/u. Pt c/o fatigue. Reviewed meds with pt verbally.    HPI:  [name] [name] is a 71 year old gentleman with coronary artery disease, occluded left circumflex, severely diseased proximal RCA with stent placed December 2009, with no intervention performed on the circumflex secondary to significant collateral circulation, who continues to smoke one pack per day, has hyperlipidemia, episodes of chest pain who previously worked in [place], now retired, who presents for routine follow up of his coronary artery disease.   In follow-up today, he is enjoying retired life Working around [employer], Father is 48 yo, helping his father  Continues to smoke but trying to quit  Awhile back with sting or burn in his chest, gone away shovelled with no sx  previous hospital admission febrile 2016 for fatigue found to have glucose levels more than 400  Denies having any significant chest pain concerning for angina. 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   PMH:   has a past medical history of Bradycardia; Coronary artery disease; Diabetes mellitus without complication ([place]); Hyperlipidemia; Hypertension; and Nephrolithiasis.  PSH:    Past Surgical History:  Procedure Laterality Date  . CATARACT EXTRACTION    . KIDNEY STONE SURGERY    . NECK SURGERY  Feb 2013  . nephrolithiasis    . stent (other)      Current Outpatient Prescriptions  Medication Sig Dispense Refill  . aspirin 81 MG EC tablet Take 81 mg by mouth daily.      Marland Kitchen atorvastatin (LIPITOR) 40 MG tablet Take 1 tablet (40 mg  total) by mouth daily. 90 tablet 3  . clopidogrel (PLAVIX) 75 MG tablet Take 1 tablet (75 mg total) by mouth daily. 90 tablet 3  . famotidine (PEPCID) 40 MG tablet Take 1 tablet (40 mg total) by mouth daily. 30 tablet 3  . isosorbide mononitrate (IMDUR) 30 MG 24 hr tablet Take 1 tablet (30 mg total) by mouth daily. (Patient taking differently: Take 15 mg by mouth daily. ) 90 tablet 3  . metFORMIN (GLUCOPHAGE) 500 MG tablet Take by mouth 2 (two) times daily with a meal.    . nitroGLYCERIN (NITROSTAT) 0.4 MG SL tablet Place 1 tablet (0.4 mg total) under the tongue every 5 (five) minutes as needed for chest pain. 25 tablet 6   No current facility-administered medications for this visit.      Allergies:   Patient has no known allergies.   Social History:  The patient  reports that he has been smoking Cigarettes.  He has a 36.00 pack-year smoking history. He has never used smokeless tobacco. He reports that he does not drink alcohol or use drugs.   Family History:   Father had CAD, CABG age 59s   Review of Systems: Review of Systems  Constitutional: Negative.   Respiratory: Negative.   Cardiovascular: Negative.        Rare stinging/buring in chest, none recently  Gastrointestinal: Negative.   Musculoskeletal: Negative.   Neurological: Negative.   Psychiatric/Behavioral: Negative.   All other systems reviewed and are negative.  PHYSICAL EXAM: VS:  BP 120/72 (BP Location: Left Arm, Patient Position: Sitting, Cuff Size: Normal)   Pulse 71   Ht 6' (1.829 m)   Wt 197 lb 8 oz (89.6 kg)   BMI 26.79 kg/m  , BMI Body mass index is 26.79 kg/m. GEN: Well nourished, well developed, in no acute distress  HEENT: normal  Neck: no JVD, carotid bruits, or masses Cardiac: RRR; no murmurs, rubs, or gallops,no edema  Respiratory:  clear to auscultation bilaterally, normal work of breathing GI: soft, nontender, nondistended, + BS MS: no deformity or atrophy  Skin: warm and dry, no rash Neuro:   Strength and sensation are intact Psych: euthymic mood, full affect    Recent Labs: No results found for requested labs within last 8760 hours.    Lipid Panel Lab Results  Component Value Date   CHOL 145 11/23/2013   HDL 37 (L) 11/23/2013   LDLCALC 85 11/23/2013   TRIG 116 11/23/2013      Wt Readings from Last 3 Encounters:  12/08/16 197 lb 8 oz (89.6 kg)  12/09/15 205 lb 8 oz (93.2 kg)  12/04/14 186 lb 4 oz (84.5 kg)       ASSESSMENT AND PLAN:  Atherosclerosis of native coronary artery without angina pectoris, unspecified whether native or transplanted heart - Plan: EKG 12-Lead Currently with no symptoms of angina. No further workup at this time. Continue current medication regimen.  TOBACCO ABUSE 1 ppd, trying to quit We have encouraged him to continue to work on weaning his cigarettes and smoking cessation. He will continue to work on this and does not want any assistance with chantix.   Uncontrolled type 2 diabetes mellitus with hyperosmolarity without coma, without long-term current use of insulin ([place]) Needs follow up with PMD, lab work, Feels sugars are running high  Mixed hyperlipidemia Cholesterol is at goal on the current lipid regimen. No changes to the medications were made.  Essential hypertension Blood pressure is well controlled on today's visit. No changes made to the medications.   Total encounter time more than 25 minutes  Greater than 50% was spent in counseling and coordination of care with the patient   Disposition:   F/U  12 months   Orders Placed This Encounter  Procedures  . EKG 12-Lead     Signed, [name] [name], M.D., Ph.D. 12/08/2016  [place], [place]

## 2016-12-16 ENCOUNTER — Other Ambulatory Visit: Payer: Self-pay

## 2016-12-16 MED ORDER — FAMOTIDINE 40 MG PO TABS: 40.0000 mg | ORAL_TABLET | Freq: Every day | ORAL | 3 refills | Status: DC

## 2017-02-05 ENCOUNTER — Other Ambulatory Visit: Payer: Self-pay | Admitting: Cardiovascular Disease

## 2017-02-05 MED ORDER — ATORVASTATIN CALCIUM 40 MG PO TABS: 40.0000 mg | ORAL_TABLET | Freq: Every day | ORAL | 1 refills | Status: DC

## 2017-03-11 ENCOUNTER — Telehealth: Payer: Self-pay | Admitting: Cardiovascular Disease

## 2017-03-11 NOTE — Telephone Encounter (Signed)
The only local endocrinologist is Dr. Howell Rucks @ [facility] [address]  Attempted to contact pt - no answer, no machine.

## 2017-03-11 NOTE — Telephone Encounter (Signed)
Patient wants to know who Dr. [name] recommend locally not  for Diabetes Management   Please call

## 2017-03-12 NOTE — Telephone Encounter (Signed)
Spoke w/ pt's wife.  Provided her w/ Dr. Boyd Kerbs contact info. She will advise pt and have him call back w/ any questions or concerns.

## 2017-04-13 ENCOUNTER — Other Ambulatory Visit: Payer: Self-pay

## 2017-04-13 MED ORDER — ISOSORBIDE MONONITRATE ER 30 MG PO TB24: 30.0000 mg | ORAL_TABLET | Freq: Every day | ORAL | 3 refills | Status: DC

## 2017-04-13 NOTE — Telephone Encounter (Signed)
Refill request for Pepcid please advise, last filled by TG.

## 2017-04-14 MED ORDER — FAMOTIDINE 40 MG PO TABS: 40.0000 mg | ORAL_TABLET | Freq: Every day | ORAL | 3 refills | Status: DC

## 2017-06-04 ENCOUNTER — Other Ambulatory Visit: Payer: Self-pay

## 2017-08-18 ENCOUNTER — Other Ambulatory Visit: Payer: Self-pay

## 2017-08-18 ENCOUNTER — Telehealth: Payer: Self-pay | Admitting: Cardiovascular Disease

## 2017-08-18 MED ORDER — FAMOTIDINE 40 MG PO TABS: 40.0000 mg | ORAL_TABLET | Freq: Every day | ORAL | 0 refills | Status: DC

## 2017-08-18 MED ORDER — ATORVASTATIN CALCIUM 40 MG PO TABS: 40.0000 mg | ORAL_TABLET | Freq: Every day | ORAL | 0 refills | Status: DC

## 2017-08-18 NOTE — Telephone Encounter (Signed)
°*  STAT* If patient is at the pharmacy, call can be transferred to refill team.   1. Which medications need to be refilled? (please list name of each medication and dose if known) LIPITOR 40 Mg taken daily and PEPCID 40 Mg taken daily  2. Which pharmacy/location (including street and city if local pharmacy) is medication to be sent to?  [place]   3. Do they need a 30 day or 90 day supply? 90 day   PT is out of Medication

## 2017-08-19 NOTE — Telephone Encounter (Signed)
Medication was refilled for 90 day supply 08/18/2017 by Othelia Pulling, [place].

## 2017-09-17 ENCOUNTER — Telehealth: Payer: Self-pay | Admitting: Cardiovascular Disease

## 2017-09-17 ENCOUNTER — Other Ambulatory Visit: Payer: Self-pay

## 2017-09-17 NOTE — Telephone Encounter (Signed)
Patient is aware the Pepcid is at the pharmacy, was sent in in Oct. 3, 2018.

## 2017-09-17 NOTE — Telephone Encounter (Signed)
°*  STAT* If patient is at the pharmacy, call can be transferred to refill team.   1. Which medications need to be refilled? (please list name of each medication and dose if known)   Pepcid 40 mg q Day 2. Which pharmacy/location (including street and city if local pharmacy) is medication to be sent to?  [facility]   3. Do they need a 30 day or 90 day supply? [place]

## 2017-09-18 ENCOUNTER — Other Ambulatory Visit: Payer: Self-pay | Admitting: Cardiovascular Disease

## 2017-10-17 NOTE — Progress Notes (Signed)
Cardiology Office Note  Date:  10/19/2017   ID:  [name] [name], DOB [birth year], MRN [id number]  PCP:  Cletis Athens, MD   Chief Complaint  Patient presents with  . OTHER    OD 6 month f/u occassional chest discomfort. Meds reviewed verbally with pt.    HPI:  [name] [name] is a 71 year old gentleman with  coronary artery disease,  occluded left circumflex, severely diseased proximal RCA with stent placed December 2009, with no intervention performed on the circumflex secondary to significant collateral circulation,  who continues to smoke one pack per day,  hyperlipidemia,  episodes of chest pain who previously worked in trucking, now retired,  who presents for routine follow up of his coronary artery disease.   Rare stinging in the chest No recent labs Still smoking Sugars running high Has not seen PMD/massoud  Stressors at home taking care of 2 elderly patients Mother is 33, Father is 15, both with medical issues Otherwise enjoying retired life Working around [employer],  Previous episode hospital admission 2016 for fatigue found to have glucose levels more than 400  EKG on today's visit shows normal sinus rhythm with rate 63 beats minute, right bundle branch block  Other past medical history reviewed Status post neck surgery, Lost significant weight Previously had  occasional episodes of dizziness.   PMH:   has a past medical history of Bradycardia, Coronary artery disease, Diabetes mellitus without complication ([place]), Hyperlipidemia, Hypertension, and Nephrolithiasis.  PSH:    Past Surgical History:  Procedure Laterality Date  . CATARACT EXTRACTION    . KIDNEY STONE SURGERY    . NECK SURGERY  Feb 2013  . nephrolithiasis    . stent (other)      Current Outpatient Medications  Medication Sig Dispense Refill  . aspirin 81 MG EC tablet Take 81 mg by mouth daily.      Marland Kitchen atorvastatin (LIPITOR) 40 MG tablet Take 1 tablet (40 mg total) by mouth daily. 90 tablet 3   . clopidogrel (PLAVIX) 75 MG tablet Take 1 tablet (75 mg total) by mouth daily. 90 tablet 3  . famotidine (PEPCID) 40 MG tablet TAKE ONE TABLET BY MOUTH EVERY DAY 30 tablet 2  . isosorbide mononitrate (IMDUR) 30 MG 24 hr tablet Take 1 tablet (30 mg total) by mouth daily. 90 tablet 3  . metFORMIN (GLUCOPHAGE) 500 MG tablet Take by mouth 2 (two) times daily with a meal.    . nitroGLYCERIN (NITROSTAT) 0.4 MG SL tablet Place 1 tablet (0.4 mg total) under the tongue every 5 (five) minutes as needed for chest pain. 25 tablet 6   No current facility-administered medications for this visit.     Allergies:   Patient has no known allergies.   Social History:  The patient  reports that he has been smoking cigarettes.  He has a 36.00 pack-year smoking history. he has never used smokeless tobacco. He reports that he does not drink alcohol or use drugs.   Family History:   Father had CAD, CABG age 36s   Review of Systems: Review of Systems  Constitutional: Negative.   Respiratory: Negative.   Cardiovascular: Negative.        Rare stinging/buring in chest  Gastrointestinal: Negative.   Musculoskeletal: Negative.   Neurological: Negative.   Psychiatric/Behavioral: Negative.   All other systems reviewed and are negative.    PHYSICAL EXAM: VS:  BP 140/60 (BP Location: Left Arm, Patient Position: Sitting, Cuff Size: Normal)   Pulse 63  Ht 6' (1.829 m)   Wt 195 lb (88.5 kg)   BMI 26.45 kg/m  , BMI Body mass index is 26.45 kg/m.  No significant change in exam GEN: Well nourished, well developed, in no acute distress  HEENT: normal  Neck: no JVD, carotid bruits, or masses Cardiac: RRR; no murmurs, rubs, or gallops,no edema  Respiratory:  clear to auscultation bilaterally, normal work of breathing GI: soft, nontender, nondistended, + BS MS: no deformity or atrophy  Skin: warm and dry, no rash Neuro:  Strength and sensation are intact Psych: euthymic mood, full affect    Recent  Labs: No results found for requested labs within last 8760 hours.    Lipid Panel Lab Results  Component Value Date   CHOL 145 11/23/2013   HDL 37 (L) 11/23/2013   LDLCALC 85 11/23/2013   TRIG 116 11/23/2013      Wt Readings from Last 3 Encounters:  10/19/17 195 lb (88.5 kg)  12/08/16 197 lb 8 oz (89.6 kg)  12/09/15 205 lb 8 oz (93.2 kg)       ASSESSMENT AND PLAN:  Atherosclerosis of native coronary artery without angina pectoris, unspecified whether native or transplanted heart - Plan: EKG 12-Lead Currently with no symptoms of angina. No further workup at this time. Continue current medication regimen.  Stable  TOBACCO ABUSE 1 ppd.  Smoking cessation again discussed with him We have encouraged him to continue to work on weaning his cigarettes and smoking cessation. He will continue to work on this and does not want any assistance with chantix.   Uncontrolled type 2 diabetes mellitus with hyperosmolarity without coma, without long-term current use of insulin ([place]) Has not had follow-up with primary care, her last lipid panel January 2015 Orders placed to have lab work done today  Mixed hyperlipidemia Orders placed to have lipids done today Previously was well controlled Medications renewed  Essential hypertension Blood pressure is well controlled on today's visit. No changes made to the medications. Stable   Total encounter time more than 25 minutes  Greater than 50% was spent in counseling and coordination of care with the patient   Disposition:   F/U  12 months   Orders Placed This Encounter  Procedures  . EKG 12-Lead     Signed, Esmond Plants, M.D., Ph.D. 10/19/2017  [place], [place]

## 2017-10-18 ENCOUNTER — Other Ambulatory Visit: Payer: Self-pay | Admitting: Cardiovascular Disease

## 2017-10-19 ENCOUNTER — Ambulatory Visit (INDEPENDENT_AMBULATORY_CARE_PROVIDER_SITE_OTHER): Payer: Medicare Other | Admitting: Cardiovascular Disease

## 2017-10-19 ENCOUNTER — Encounter: Payer: Self-pay | Admitting: Cardiovascular Disease

## 2017-10-19 VITALS — BP 140/60 | HR 63 | Ht 72.0 in | Wt 195.0 lb

## 2017-10-19 DIAGNOSIS — I10 Essential (primary) hypertension: Secondary | ICD-10-CM | POA: Diagnosis not present

## 2017-10-19 DIAGNOSIS — F17200 Nicotine dependence, unspecified, uncomplicated: Secondary | ICD-10-CM

## 2017-10-19 DIAGNOSIS — E119 Type 2 diabetes mellitus without complications: Secondary | ICD-10-CM | POA: Diagnosis not present

## 2017-10-19 DIAGNOSIS — I209 Angina pectoris, unspecified: Secondary | ICD-10-CM

## 2017-10-19 DIAGNOSIS — I2510 Atherosclerotic heart disease of native coronary artery without angina pectoris: Secondary | ICD-10-CM

## 2017-10-19 DIAGNOSIS — I25118 Atherosclerotic heart disease of native coronary artery with other forms of angina pectoris: Secondary | ICD-10-CM

## 2017-10-19 DIAGNOSIS — E782 Mixed hyperlipidemia: Secondary | ICD-10-CM

## 2017-10-19 MED ORDER — ISOSORBIDE MONONITRATE ER 30 MG PO TB24: 30.0000 mg | ORAL_TABLET | Freq: Every day | ORAL | 3 refills | Status: DC

## 2017-10-19 MED ORDER — ATORVASTATIN CALCIUM 40 MG PO TABS: 40.0000 mg | ORAL_TABLET | Freq: Every day | ORAL | 3 refills | Status: DC

## 2017-10-19 MED ORDER — CLOPIDOGREL BISULFATE 75 MG PO TABS: 75.0000 mg | ORAL_TABLET | Freq: Every day | ORAL | 3 refills | Status: DC

## 2017-10-19 NOTE — Patient Instructions (Addendum)
Dr. Adella Hare [phone] Endocrinologist [place]   Medication Instructions:   No medication changes made  Labwork:  We will check liver, lipids and HBA1C  Testing/Procedures:  No further testing at this time   Follow-Up: It was a pleasure seeing you in the office today. Please call us if you have new issues that need to be addressed before your next appt.  [phone]  Your physician wants you to follow-up in: 12 months.  You will receive a reminder letter in the mail two months in advance. If you don't receive a letter, please call our office to schedule the follow-up appointment.  If you need a refill on your cardiac medications before your next appointment, please call your pharmacy.

## 2017-10-20 LAB — HEMOGLOBIN A1C
ESTIMATED AVERAGE GLUCOSE: 177 mg/dL
HEMOGLOBIN A1C: 7.8 % — AB (ref 4.8–5.6)

## 2017-10-20 LAB — HEPATIC FUNCTION PANEL
ALT: 27 IU/L (ref 0–44)
AST: 19 IU/L (ref 0–40)
Albumin: 4.6 g/dL (ref 3.6–4.8)
Alkaline Phosphatase: 83 IU/L (ref 39–117)
BILIRUBIN TOTAL: 0.4 mg/dL (ref 0.0–1.2)
BILIRUBIN, DIRECT: 0.12 mg/dL (ref 0.00–0.40)
Total Protein: 6.8 g/dL (ref 6.0–8.5)

## 2017-10-20 LAB — LIPID PANEL
CHOL/HDL RATIO: 3.4 ratio (ref 0.0–5.0)
Cholesterol, Total: 102 mg/dL (ref 100–199)
HDL: 30 mg/dL — AB (ref >39)
LDL Calculated: 40 mg/dL (ref 0–99)
Triglycerides: 158 mg/dL — ABNORMAL HIGH (ref 0–149)
VLDL CHOLESTEROL CAL: 32 mg/dL (ref 5–40)

## 2017-12-16 ENCOUNTER — Other Ambulatory Visit: Payer: Self-pay | Admitting: Cardiovascular Disease

## 2017-12-16 NOTE — Telephone Encounter (Signed)
Please advise if ok to refill Pepcid. °

## 2018-03-16 ENCOUNTER — Other Ambulatory Visit: Payer: Self-pay | Admitting: Cardiovascular Disease

## 2018-03-16 NOTE — Telephone Encounter (Signed)
Please advise if ok to refill Pepcid. °

## 2018-10-12 ENCOUNTER — Other Ambulatory Visit: Payer: Self-pay | Admitting: Cardiovascular Disease

## 2018-10-20 ENCOUNTER — Other Ambulatory Visit: Payer: Self-pay | Admitting: Cardiovascular Disease

## 2018-10-20 NOTE — Telephone Encounter (Signed)
Pt is due for 12 month f/u. Please contact pt to schedule future appointment, Thank you!

## 2018-10-24 NOTE — Telephone Encounter (Signed)
No ans no vm .  Will attempt to contact another time.

## 2018-10-31 NOTE — Telephone Encounter (Signed)
Scheduled 1/13 at 920 am

## 2018-11-21 ENCOUNTER — Other Ambulatory Visit: Payer: Self-pay | Admitting: Cardiovascular Disease

## 2018-11-27 NOTE — Progress Notes (Signed)
Cardiology Office Note  Date:  11/28/2018   ID:  [name] [name], DOB 07/26/1946, MRN [id number]  PCP:  Cletis Athens, MD   Chief Complaint  Patient presents with  . other    12 month follow up. Meds reviewed by the pt. verbally. Pt. c/o chest pain at times.     HPI:  [name] [name] is a 73 year old gentleman with  coronary artery disease,  occluded left circumflex, severely diseased proximal RCA with stent placed December 2009, with no intervention performed on the circumflex secondary to significant collateral circulation,  who continues to smoke one pack per day,  hyperlipidemia,  episodes of chest pain who previously worked in trucking, now retired,  who presents for routine follow up of his coronary artery disease.   Having sinus problems at least one month Postnasal drip Other family members were sick Does not think he is ready for antibiotic  Lab work reviewed HBA1C 7.8 Still smoking but smoking less per the patient  Previously seen by PMD/massoud Has not been to his office in quite some time  Lost weight in past 2 years  Mother recently died 44 years old father now 94 years old Otherwise enjoying retired life Working around the house  EKG personally reviewed by myself on todays visit Shows normal sinus rhythm with rate 65 bpm no significant ST-T wave changes  Other past medical history reviewed Previous episode hospital admission 2016 for fatigue found to have glucose levels more than 400  Status post neck surgery, Lost significant weight Previously had  occasional episodes of dizziness.   PMH:   has a past medical history of Bradycardia, Coronary artery disease, Diabetes mellitus without complication ([place]), Hyperlipidemia, Hypertension, and Nephrolithiasis.  PSH:    Past Surgical History:  Procedure Laterality Date  . CATARACT EXTRACTION    . KIDNEY STONE SURGERY    . NECK SURGERY  Feb 2013  . nephrolithiasis    . stent (other)      Current  Outpatient Medications  Medication Sig Dispense Refill  . aspirin 81 MG EC tablet Take 81 mg by mouth daily.      Marland Kitchen atorvastatin (LIPITOR) 40 MG tablet TAKE ONE TABLET BY MOUTH EVERY DAY 30 tablet 0  . clopidogrel (PLAVIX) 75 MG tablet TAKE ONE TABLET BY MOUTH EVERY DAY 90 tablet 0  . famotidine (PEPCID) 40 MG tablet TAKE ONE TABLET BY MOUTH EVERY DAY 90 tablet 3  . isosorbide mononitrate (IMDUR) 30 MG 24 hr tablet Take 1 tablet (30 mg total) by mouth daily. 90 tablet 3  . metFORMIN (GLUCOPHAGE) 500 MG tablet Take by mouth 2 (two) times daily with a meal.    . nitroGLYCERIN (NITROSTAT) 0.4 MG SL tablet Place 1 tablet (0.4 mg total) under the tongue every 5 (five) minutes as needed for chest pain. 25 tablet 6   No current facility-administered medications for this visit.     Allergies:   Patient has no known allergies.   Social History:  The patient  reports that he has been smoking cigarettes. He has a 18.00 pack-year smoking history. He has never used smokeless tobacco. He reports that he does not drink alcohol or use drugs.   Family History:   Father had CAD, CABG age 11s   Review of Systems: Review of Systems  Constitutional: Negative.   HENT: Positive for congestion.   Respiratory: Positive for cough.   Cardiovascular: Negative.        Rare stinging/buring in chest  Gastrointestinal: Negative.  Musculoskeletal: Negative.   Neurological: Negative.   Psychiatric/Behavioral: Negative.   All other systems reviewed and are negative.    PHYSICAL EXAM: VS:  BP 138/74 (BP Location: Left Arm, Patient Position: Sitting, Cuff Size: Normal)   Pulse 65   Ht 6' (1.829 m)   Wt 183 lb (83 kg)   BMI 24.82 kg/m  , BMI Body mass index is 24.82 kg/m.  Constitutional:  oriented to person, place, and time. No distress.  HENT:  Head: Grossly normal Eyes:  no discharge. No scleral icterus.  Neck: No JVD, no carotid bruits  Cardiovascular: Regular rate and rhythm, no murmurs  appreciated Pulmonary/Chest: Clear to auscultation bilaterally, no wheezes or rails Abdominal: Soft.  no distension.  no tenderness.  Musculoskeletal: Normal range of motion Neurological:  normal muscle tone. Coordination normal. No atrophy Skin: Skin warm and dry Psychiatric: normal affect, pleasant   Recent Labs: No results found for requested labs within last 8760 hours.    Lipid Panel Lab Results  Component Value Date   CHOL 102 10/19/2017   HDL 30 (L) 10/19/2017   LDLCALC 40 10/19/2017   TRIG 158 (H) 10/19/2017      Wt Readings from Last 3 Encounters:  11/28/18 183 lb (83 kg)  10/19/17 195 lb (88.5 kg)  12/08/16 197 lb 8 oz (89.6 kg)     ASSESSMENT AND PLAN:  Atherosclerosis of native coronary artery without angina pectoris, unspecified whether native or transplanted heart -  Denies any anginal symptoms, no further testing at this time Stressed importance of smoking cessation  TOBACCO ABUSE Reports he is smoking less 1 pack lasting him a week or more Recommended cessation  Uncontrolled type 2 diabetes mellitus with hyperosmolarity without coma, without long-term current use of insulin ([facility]) Has not had follow-up with primary care,  Recommended he follow-up as hemoglobin A1c trending higher  Mixed hyperlipidemia Cholesterol at goal last year He will follow-up with primary care for routine lab work  Essential hypertension Blood pressure is well controlled on today's visit. No changes made to the medications. Medications refilled  Sinusitis Postnasal drip but is not ready for antibiotics Reports symptoms for over 1 month ,chronic coughing He will talk with primary care or call our office if symptoms get worse   Total encounter time more than 25 minutes  Greater than 50% was spent in counseling and coordination of care with the patient   Disposition:   F/U  12 months   Orders Placed This Encounter  Procedures  . EKG 12-Lead     Signed, Esmond Plants, M.D., Ph.D. 11/28/2018  [place], [place]

## 2018-11-28 ENCOUNTER — Ambulatory Visit: Payer: Medicare HMO | Admitting: Cardiovascular Disease

## 2018-11-28 ENCOUNTER — Encounter: Payer: Self-pay | Admitting: Cardiovascular Disease

## 2018-11-28 VITALS — BP 138/74 | HR 65 | Ht 72.0 in | Wt 183.0 lb

## 2018-11-28 DIAGNOSIS — I25118 Atherosclerotic heart disease of native coronary artery with other forms of angina pectoris: Secondary | ICD-10-CM

## 2018-11-28 DIAGNOSIS — E1100 Type 2 diabetes mellitus with hyperosmolarity without nonketotic hyperglycemic-hyperosmolar coma (NKHHC): Secondary | ICD-10-CM

## 2018-11-28 DIAGNOSIS — E782 Mixed hyperlipidemia: Secondary | ICD-10-CM

## 2018-11-28 DIAGNOSIS — I10 Essential (primary) hypertension: Secondary | ICD-10-CM

## 2018-11-28 DIAGNOSIS — F17200 Nicotine dependence, unspecified, uncomplicated: Secondary | ICD-10-CM | POA: Diagnosis not present

## 2018-11-28 NOTE — Patient Instructions (Addendum)
In you get sinus pressure , infection, Call Dr. Rebecka Apley or out office  Medication Instructions:  No changes  If you need a refill on your cardiac medications before your next appointment, please call your pharmacy.   Lab work: No new labs needed  If you have labs (blood work) drawn today and your tests are completely normal, you will receive your results only by: Marland Kitchen MyChart Message (if you have MyChart) OR . A paper copy in the mail If you have any lab test that is abnormal or we need to change your treatment, we will call you to review the results.   Testing/Procedures: No new testing needed   Follow-Up: At [facility], you and your health needs are our priority.  As part of our continuing mission to provide you with exceptional heart care, we have created designated Provider Care Teams.  These Care Teams include your primary Cardiologist (physician) and Advanced Practice Providers (APPs -  Physician Assistants and Nurse Practitioners) who all work together to provide you with the care you need, when you need it.  . You will need a follow up appointment in 12 months .   Please call our office 2 months in advance to schedule this appointment.    . Providers on your designated Care Team:   . Murray Hodgkins, NP . Christell Faith, PA-C . Marrianne Mood, PA-C  Any Other Special Instructions Will Be Listed Below (If Applicable).  For educational health videos Log in to : [email or web address] Or : [email or web address], password : [email or web address]

## 2018-12-23 ENCOUNTER — Other Ambulatory Visit: Payer: Self-pay | Admitting: Cardiovascular Disease

## 2019-01-05 ENCOUNTER — Other Ambulatory Visit: Payer: Self-pay | Admitting: Cardiovascular Disease

## 2019-01-24 ENCOUNTER — Other Ambulatory Visit: Payer: Self-pay | Admitting: Cardiovascular Disease

## 2019-01-26 ENCOUNTER — Other Ambulatory Visit: Payer: Self-pay | Admitting: Cardiovascular Disease

## 2019-02-23 ENCOUNTER — Other Ambulatory Visit: Payer: Self-pay | Admitting: Cardiovascular Disease

## 2019-07-21 ENCOUNTER — Other Ambulatory Visit: Payer: Self-pay | Admitting: Cardiovascular Disease

## 2020-01-10 ENCOUNTER — Other Ambulatory Visit: Payer: Self-pay | Admitting: Cardiovascular Disease

## 2020-01-18 ENCOUNTER — Telehealth: Payer: Self-pay | Admitting: Cardiovascular Disease

## 2020-01-18 NOTE — Telephone Encounter (Signed)
Patient wants a referral for Diabetes local only please

## 2020-01-18 NOTE — Telephone Encounter (Signed)
Routing to Dr. [name] to advise who he would refer patient to for endocrinology.

## 2020-01-21 NOTE — Telephone Encounter (Signed)
Endocrine doctors at [facility] Not sure if we have one in the [facility] or [place]

## 2020-01-22 NOTE — Telephone Encounter (Signed)
Spoke with patient and reviewed the only one I am aware of is at [facility] and provided with their number. He verbalized understanding with no further questions at this time.

## 2020-01-24 ENCOUNTER — Encounter: Payer: Self-pay | Admitting: Family

## 2020-01-24 ENCOUNTER — Ambulatory Visit (INDEPENDENT_AMBULATORY_CARE_PROVIDER_SITE_OTHER): Payer: Medicare HMO | Admitting: Family

## 2020-01-24 ENCOUNTER — Other Ambulatory Visit: Payer: Self-pay

## 2020-01-24 VITALS — BP 144/80 | HR 58 | Ht 72.0 in | Wt 193.8 lb

## 2020-01-24 DIAGNOSIS — F17200 Nicotine dependence, unspecified, uncomplicated: Secondary | ICD-10-CM | POA: Diagnosis not present

## 2020-01-24 DIAGNOSIS — E782 Mixed hyperlipidemia: Secondary | ICD-10-CM | POA: Diagnosis not present

## 2020-01-24 DIAGNOSIS — I25118 Atherosclerotic heart disease of native coronary artery with other forms of angina pectoris: Secondary | ICD-10-CM

## 2020-01-24 DIAGNOSIS — E1100 Type 2 diabetes mellitus with hyperosmolarity without nonketotic hyperglycemic-hyperosmolar coma (NKHHC): Secondary | ICD-10-CM | POA: Diagnosis not present

## 2020-01-24 DIAGNOSIS — I10 Essential (primary) hypertension: Secondary | ICD-10-CM

## 2020-01-24 NOTE — Progress Notes (Signed)
Office Visit    Patient Name: [name] [name] Date of Encounter: 01/24/2020  Primary Care Provider:  Cletis Athens, MD Primary Cardiologist:  Ida Rogue, MD Electrophysiologist:  None   Chief Complaint    [name] [name] is a 74 y.o. male with a hx of CAD, tobacco abuse, HLD, DM 2 presents today for follow-up of CAD  Past Medical History    Past Medical History:  Diagnosis Date  . Bradycardia   . Coronary artery disease   . Diabetes mellitus without complication ([place])   . Hyperlipidemia   . Hypertension   . Nephrolithiasis    Past Surgical History:  Procedure Laterality Date  . CATARACT EXTRACTION    . KIDNEY STONE SURGERY    . NECK SURGERY  Feb 2013  . nephrolithiasis    . stent (other)     Allergies No Known Allergies  History of Present Illness    Benford Poniatowski is a 74 y.o. male with a hx of CAD, DM2, HLD, tobacco abuse last seen January 2020 by Dr. [name].  His CAD is s/p occluded LCx, severely diseased proximal RCA with stent placed December 2009, and no intervention on circumflex secondary to significant collateral circulation.  Helps take care of his dad who is 87. This creates some amount of stress.   Blood sugar 230s at home when he checks. Reports feeling more fatigued with this.  He is interested in pursuing endocrinology assistance and provided the number.  Discussed with him that his primary care provider could likely help manage his diabetes but he tells me he prefers to the specialist.  Has not seen his primary care doctor in some time.  We discussed the importance of routine monitoring labs today which he declined.  Reports no chest pain, chest tightness.  Reports no shortness of breath or dyspnea on exertion.  No orthopnea, and edema, PND.  No formal exercise regimen.  Discharge today after helping his brother.  Tells me he tries a heart healthy diet and requests a handout on how to eat healthy.   EKGs/Labs/Other Studies  Reviewed:   The following studies were reviewed today:  EKG:  EKG is ordered today.  The ekg ordered today demonstrates SB 58 bpm with nonspecific intraventricular block - stable compared to previous  Recent Labs: No results found for requested labs within last 8760 hours.  Recent Lipid Panel    Component Value Date/Time   CHOL 102 10/19/2017 1139   TRIG 158 (H) 10/19/2017 1139   HDL 30 (L) 10/19/2017 1139   CHOLHDL 3.4 10/19/2017 1139   CHOLHDL 4.8 Ratio 11/07/2010 2017   VLDL 41 (H) 11/07/2010 2017   LDLCALC 40 10/19/2017 1139   Home Medications   Current Meds  Medication Sig  . aspirin 81 MG EC tablet Take 81 mg by mouth daily.    Marland Kitchen atorvastatin (LIPITOR) 40 MG tablet TAKE 1 TABLET BY MOUTH EVERY DAY  . clopidogrel (PLAVIX) 75 MG tablet TAKE 1 TABLET BY MOUTH EVERY DAY  . famotidine (PEPCID) 40 MG tablet TAKE 1 TABLET BY MOUTH EVERY DAY  . isosorbide mononitrate (IMDUR) 30 MG 24 hr tablet TAKE ONE TABLET BY MOUTH EVERY DAY  . metFORMIN (GLUCOPHAGE) 500 MG tablet Take by mouth 2 (two) times daily with a meal.  . nitroGLYCERIN (NITROSTAT) 0.4 MG SL tablet Place 1 tablet (0.4 mg total) under the tongue every 5 (five) minutes as needed for chest pain.    Review of Systems  Review of Systems  Constitution: Negative for chills, fever and malaise/fatigue.  Cardiovascular: Negative for chest pain, dyspnea on exertion, leg swelling, near-syncope, orthopnea, palpitations and syncope.  Respiratory: Negative for cough, shortness of breath and wheezing.   Endocrine:       (+) elevated blood sugar readings  Gastrointestinal: Negative for nausea and vomiting.  Neurological: Negative for dizziness, light-headedness and weakness.   All other systems reviewed and are otherwise negative except as noted above.  Physical Exam    VS:  BP (!) 144/80 (BP Location: Left Arm, Patient Position: Sitting, Cuff Size: Normal)   Pulse (!) 58   Ht 6' (1.829 m)   Wt 193 lb 12 oz (87.9 kg)   SpO2  97%   BMI 26.28 kg/m  , BMI Body mass index is 26.28 kg/m. GEN: Well nourished, well developed, in no acute distress. HEENT: normal. Neck: Supple, no JVD, carotid bruits, or masses. Cardiac: RRR, no murmurs, rubs, or gallops. No clubbing, cyanosis, edema.  Radials/PT 2+ and equal bilaterally.  Respiratory:  Respirations regular and unlabored, clear to auscultation bilaterally. GI: Soft, nontender, nondistended, BS + x 4. MS: No deformity or atrophy. Skin: Warm and dry, no rash. Neuro:  Strength and sensation are intact. Psych: Normal affect.  Accessory Clinical Findings    ECG personally reviewed by me today - SB 58bpm with nonspecific intraventricular block - no acute changes.  Assessment & Plan    1. CAD -stable with no anginal symptoms.  No indication for ischemic evaluation at this time.  Continue GDMT aspirin, Plavix, statin.  Low-sodium heart healthy diet recommended.  Regular cardiovascular exercise encouraged.  2. HLD LDL goal less than 70 - Continue Atorvastatin daily. We discussed repeat lipid profile and CMEt today for monitoring, but he declined.   3. DM2 - Blood sugard have been elevated at home. He requests information for endocrinology and given phone number. Encouraged him to see his PCP.   4. HTN - BP elevated today. Does not check at home. We have increased his Indur today to help with his blood pressure. He is somehwat hesitant regarding medication changes so we have opted to increase the dose of what he is presently taking, rather than adding a new medications.   5. Tobacco abuse - Smoking cessation encouraged. Recommend utilization of 1800QUITNOW. Would likely benefit from CT for screening for lung cancer. Will defer to primary care.   Disposition: Follow up in 3 month(s) with Dr. [name] or APP.    Loel Dubonnet, NP 01/24/2020, 10:32 AM

## 2020-01-24 NOTE — Patient Instructions (Addendum)
Medication Instructions:  Your physician has recommended you make the following change in your medication:   CHANGE Imdur to 60mg  daily. We have sent a new prescription in for your.   This will help lower your blood pressure which was high today. Our goal is for your blood pressure to be less than 130/80.   *If you need a refill on your cardiac medications before your next appointment, please call your pharmacy*   Lab Work: If you are able to get blood work done. We recommend checking your cholesterol at your earliest convenience.   Your physician recommends that you return for lab work:  CMET, lipid panel  -Lab orders are in and may be done on a walk in basis. -Medical Mall entrance at [facility], 1st desk on the right to check in (past the screening table) -You would need to be FASTING for 8 hours prior, but may have water or black coffee prior to your lab draw -Monday- Friday (7:30 am- 5:30 pm)  If you have labs (blood work) drawn today and your tests are completely normal, you will receive your results only by: Marland Kitchen MyChart Message (if you have MyChart) OR . A paper copy in the mail If you have any lab test that is abnormal or we need to change your treatment, we will call you to review the results.   Testing/Procedures: Your EKG shows sinus bradycardia which is a stable finding.   Follow-Up: At [facility], you and your health needs are our priority.  As part of our continuing mission to provide you with exceptional heart care, we have created designated Provider Care Teams.  These Care Teams include your primary Cardiologist (physician) and Advanced Practice Providers (APPs -  Physician Assistants and Nurse Practitioners) who all work together to provide you with the care you need, when you need it.  We recommend signing up for the patient portal called "MyChart".  Sign up information is provided on this After Visit Summary.  MyChart is used to connect with patients for Virtual Visits  (Telemedicine).  Patients are able to view lab/test results, encounter notes, upcoming appointments, etc.  Non-urgent messages can be sent to your provider as well.   To learn more about what you can do with MyChart, go to [email or web address].    Your next appointment:   3 month(s)  The format for your next appointment:   In Person  Provider:   You may see Ida Rogue, MD or one of the following Advanced Practice Providers on your designated Care Team:    Murray Hodgkins, NP  Christell Faith, PA-C  Marrianne Mood, PA-C  Other Instructions  Call [facility] Endocrinology to establish as a new patient. Their phone number is [phone].   Recommend checking your blood pressure 3 times per week and keeping a log. We would like your blood pressure to be consistently less than 130/80.   Diabetes Mellitus and Nutrition, Adult When you have diabetes (diabetes mellitus), it is very important to have healthy eating habits because your blood sugar (glucose) levels are greatly affected by what you eat and drink. Eating healthy foods in the appropriate amounts, at about the same times every day, can help you:  Control your blood glucose.  Lower your risk of heart disease.  Improve your blood pressure.  Reach or maintain a healthy weight. Every person with diabetes is different, and each person has different needs for a meal plan. Your health care provider may recommend that you work with a  diet and nutrition specialist (dietitian) to make a meal plan that is best for you. Your meal plan may vary depending on factors such as:  The calories you need.  The medicines you take.  Your weight.  Your blood glucose, blood pressure, and cholesterol levels.  Your activity level.  Other health conditions you have, such as heart or kidney disease. How do carbohydrates affect me? Carbohydrates, also called carbs, affect your blood glucose level more than any other type of food. Eating  carbs naturally raises the amount of glucose in your blood. Carb counting is a method for keeping track of how many carbs you eat. Counting carbs is important to keep your blood glucose at a healthy level, especially if you use insulin or take certain oral diabetes medicines. It is important to know how many carbs you can safely have in each meal. This is different for every person. Your dietitian can help you calculate how many carbs you should have at each meal and for each snack. Foods that contain carbs include:  Bread, cereal, rice, pasta, and crackers.  Potatoes and corn.  Peas, beans, and lentils.  Milk and yogurt.  Fruit and juice.  Desserts, such as cakes, cookies, ice cream, and candy. How does alcohol affect me? Alcohol can cause a sudden decrease in blood glucose (hypoglycemia), especially if you use insulin or take certain oral diabetes medicines. Hypoglycemia can be a life-threatening condition. Symptoms of hypoglycemia (sleepiness, dizziness, and confusion) are similar to symptoms of having too much alcohol. If your health care provider says that alcohol is safe for you, follow these guidelines:  Limit alcohol intake to no more than 1 drink per day for nonpregnant women and 2 drinks per day for men. One drink equals 12 oz of beer, 5 oz of wine, or 1 oz of hard liquor.  Do not drink on an empty stomach.  Keep yourself hydrated with water, diet soda, or unsweetened iced tea.  Keep in mind that regular soda, juice, and other mixers may contain a lot of sugar and must be counted as carbs. What are tips for following this plan?  Reading food labels  Start by checking the serving size on the "Nutrition Facts" label of packaged foods and drinks. The amount of calories, carbs, fats, and other nutrients listed on the label is based on one serving of the item. Many items contain more than one serving per package.  Check the total grams (g) of carbs in one serving. You can  calculate the number of servings of carbs in one serving by dividing the total carbs by 15. For example, if a food has 30 g of total carbs, it would be equal to 2 servings of carbs.  Check the number of grams (g) of saturated and trans fats in one serving. Choose foods that have low or no amount of these fats.  Check the number of milligrams (mg) of salt (sodium) in one serving. Most people should limit total sodium intake to less than 2,300 mg per day.  Always check the nutrition information of foods labeled as "low-fat" or "nonfat". These foods may be higher in added sugar or refined carbs and should be avoided.  Talk to your dietitian to identify your daily goals for nutrients listed on the label. Shopping  Avoid buying canned, premade, or processed foods. These foods tend to be high in fat, sodium, and added sugar.  Shop around the outside edge of the grocery store. This includes fresh fruits and  vegetables, bulk grains, fresh meats, and fresh dairy. Cooking  Use low-heat cooking methods, such as baking, instead of high-heat cooking methods like deep frying.  Cook using healthy oils, such as olive, canola, or sunflower oil.  Avoid cooking with butter, cream, or high-fat meats. Meal planning  Eat meals and snacks regularly, preferably at the same times every day. Avoid going long periods of time without eating.  Eat foods high in fiber, such as fresh fruits, vegetables, beans, and whole grains. Talk to your dietitian about how many servings of carbs you can eat at each meal.  Eat 4-6 ounces (oz) of lean protein each day, such as lean meat, chicken, fish, eggs, or tofu. One oz of lean protein is equal to: ? 1 oz of meat, chicken, or fish. ? 1 egg. ?  cup of tofu.  Eat some foods each day that contain healthy fats, such as avocado, nuts, seeds, and fish. Lifestyle  Check your blood glucose regularly.  Exercise regularly as told by your health care provider. This may  include: ? 150 minutes of moderate-intensity or vigorous-intensity exercise each week. This could be brisk walking, biking, or water aerobics. ? Stretching and doing strength exercises, such as yoga or weightlifting, at least 2 times a week.  Take medicines as told by your health care provider.  Do not use any products that contain nicotine or tobacco, such as cigarettes and e-cigarettes. If you need help quitting, ask your health care provider.  Work with a Social worker or diabetes educator to identify strategies to manage stress and any emotional and social challenges. Questions to ask a health care provider  Do I need to meet with a diabetes educator?  Do I need to meet with a dietitian?  What number can I call if I have questions?  When are the best times to check my blood glucose? Where to find more information:  American Diabetes Association: diabetes.org  Academy of Nutrition and Dietetics: www.eatright.[employer] of Diabetes and Digestive and Kidney Diseases (NIH): [employer] Summary  A healthy meal plan will help you control your blood glucose and maintain a healthy lifestyle.  Working with a diet and nutrition specialist (dietitian) can help you make a meal plan that is best for you.  Keep in mind that carbohydrates (carbs) and alcohol have immediate effects on your blood glucose levels. It is important to count carbs and to use alcohol carefully. This information is not intended to replace advice given to you by your health care provider. Make sure you discuss any questions you have with your health care provider. Document Revised: 10/15/2017 Document Reviewed: 12/07/2016 Elsevier Patient Education  2020 [employer].   Steps to Quit Smoking Smoking tobacco is the leading cause of preventable death. It can affect almost every organ in the body. Smoking puts you and people around you at risk for many serious, long-lasting (chronic) diseases. Quitting  smoking can be hard, but it is one of the best things that you can do for your health. It is never too late to quit. How do I get ready to quit? When you decide to quit smoking, make a plan to help you succeed. Before you quit:  Pick a date to quit. Set a date within the next 2 weeks to give you time to prepare.  Write down the reasons why you are quitting. Keep this list in places where you will see it often.  Tell your family, friends, and co-workers that you are quitting. Their  support is important.  Talk with your doctor about the choices that may help you quit.  Find out if your health insurance will pay for these treatments.  Know the people, places, things, and activities that make you want to smoke (triggers). Avoid them. What first steps can I take to quit smoking?  Throw away all cigarettes at home, at work, and in your car.  Throw away the things that you use when you smoke, such as ashtrays and lighters.  Clean your car. Make sure to empty the ashtray.  Clean your home, including curtains and carpets. What can I do to help me quit smoking? Talk with your doctor about taking medicines and seeing a counselor at the same time. You are more likely to succeed when you do both.  If you are pregnant or breastfeeding, talk with your doctor about counseling or other ways to quit smoking. Do not take medicine to help you quit smoking unless your doctor tells you to do so. To quit smoking: Quit right away  Quit smoking totally, instead of slowly cutting back on how much you smoke over a period of time.  Go to counseling. You are more likely to quit if you go to counseling sessions regularly. Take medicine You may take medicines to help you quit. Some medicines need a prescription, and some you can buy over-the-counter. Some medicines may contain a drug called nicotine to replace the nicotine in cigarettes. Medicines may:  Help you to stop having the desire to smoke  (cravings).  Help to stop the problems that come when you stop smoking (withdrawal symptoms). Your doctor may ask you to use:  Nicotine patches, gum, or lozenges.  Nicotine inhalers or sprays.  Non-nicotine medicine that is taken by mouth. Find resources Find resources and other ways to help you quit smoking and remain smoke-free after you quit. These resources are most helpful when you use them often. They include:  Online chats with a Social worker.  Phone quitlines.  Printed Furniture conservator/restorer.  Support groups or group counseling.  Text messaging programs.  Mobile phone apps. Use apps on your mobile phone or tablet that can help you stick to your quit plan. There are many free apps for mobile phones and tablets as well as websites. Examples include Quit Guide from the [employer] and smokefree.gov  What things can I do to make it easier to quit?   Talk to your family and friends. Ask them to support and encourage you.  Call a phone quitline (1-800-QUIT-NOW), reach out to support groups, or work with a Social worker.  Ask people who smoke to not smoke around you.  Avoid places that make you want to smoke, such as: ? Bars. ? Parties. ? Smoke-break areas at work.  Spend time with people who do not smoke.  Lower the stress in your life. Stress can make you want to smoke. Try these things to help your stress: ? Getting regular exercise. ? Doing deep-breathing exercises. ? Doing yoga. ? Meditating. ? Doing a body scan. To do this, close your eyes, focus on one area of your body at a time from head to toe. Notice which parts of your body are tense. Try to relax the muscles in those areas. How will I feel when I quit smoking? Day 1 to 3 weeks Within the first 24 hours, you may start to have some problems that come from quitting tobacco. These problems are very bad 2-3 days after you quit, but they do not  often last for more than 2-3 weeks. You may get these symptoms:  Mood  swings.  Feeling restless, nervous, angry, or annoyed.  Trouble concentrating.  Dizziness.  Strong desire for high-sugar foods and nicotine.  Weight gain.  Trouble pooping (constipation).  Feeling like you may vomit (nausea).  Coughing or a sore throat.  Changes in how the medicines that you take for other issues work in your body.  Depression.  Trouble sleeping (insomnia). Week 3 and afterward After the first 2-3 weeks of quitting, you may start to notice more positive results, such as:  Better sense of smell and taste.  Less coughing and sore throat.  Slower heart rate.  Lower blood pressure.  Clearer skin.  Better breathing.  Fewer sick days. Quitting smoking can be hard. Do not give up if you fail the first time. Some people need to try a few times before they succeed. Do your best to stick to your quit plan, and talk with your doctor if you have any questions or concerns. Summary  Smoking tobacco is the leading cause of preventable death. Quitting smoking can be hard, but it is one of the best things that you can do for your health.  When you decide to quit smoking, make a plan to help you succeed.  Quit smoking right away, not slowly over a period of time.  When you start quitting, seek help from your doctor, family, or friends. This information is not intended to replace advice given to you by your health care provider. Make sure you discuss any questions you have with your health care provider. Document Revised: 07/28/2019 Document Reviewed: 01/21/2019 Elsevier Patient Education  [place].

## 2020-01-26 ENCOUNTER — Telehealth: Payer: Self-pay | Admitting: Family

## 2020-01-26 DIAGNOSIS — E1100 Type 2 diabetes mellitus with hyperosmolarity without nonketotic hyperglycemic-hyperosmolar coma (NKHHC): Secondary | ICD-10-CM

## 2020-01-26 NOTE — Telephone Encounter (Signed)
Spoke with patients wife per release form and advised that I would fax information over to Endocrinology office and they should be in touch sometime next week. She verbalized understanding with no further questions.

## 2020-01-26 NOTE — Telephone Encounter (Signed)
Patient calling for referral to [facility] endocrinology .  They will not schedule without a referral.

## 2020-02-06 DIAGNOSIS — E1142 Type 2 diabetes mellitus with diabetic polyneuropathy: Secondary | ICD-10-CM | POA: Diagnosis not present

## 2020-02-06 DIAGNOSIS — I10 Essential (primary) hypertension: Secondary | ICD-10-CM | POA: Diagnosis not present

## 2020-02-06 DIAGNOSIS — E119 Type 2 diabetes mellitus without complications: Secondary | ICD-10-CM | POA: Diagnosis not present

## 2020-02-06 DIAGNOSIS — E1159 Type 2 diabetes mellitus with other circulatory complications: Secondary | ICD-10-CM | POA: Diagnosis not present

## 2020-02-06 DIAGNOSIS — E1169 Type 2 diabetes mellitus with other specified complication: Secondary | ICD-10-CM | POA: Diagnosis not present

## 2020-02-06 DIAGNOSIS — E785 Hyperlipidemia, unspecified: Secondary | ICD-10-CM | POA: Diagnosis not present

## 2020-03-08 ENCOUNTER — Other Ambulatory Visit: Payer: Self-pay | Admitting: Cardiovascular Disease

## 2020-04-07 ENCOUNTER — Other Ambulatory Visit: Payer: Self-pay | Admitting: Cardiovascular Disease

## 2020-04-15 ENCOUNTER — Other Ambulatory Visit: Payer: Self-pay | Admitting: Internal Medicine

## 2020-05-15 DIAGNOSIS — E785 Hyperlipidemia, unspecified: Secondary | ICD-10-CM | POA: Diagnosis not present

## 2020-05-15 DIAGNOSIS — E1159 Type 2 diabetes mellitus with other circulatory complications: Secondary | ICD-10-CM | POA: Diagnosis not present

## 2020-05-15 DIAGNOSIS — E1142 Type 2 diabetes mellitus with diabetic polyneuropathy: Secondary | ICD-10-CM | POA: Diagnosis not present

## 2020-05-15 DIAGNOSIS — E1169 Type 2 diabetes mellitus with other specified complication: Secondary | ICD-10-CM | POA: Diagnosis not present

## 2020-07-02 ENCOUNTER — Other Ambulatory Visit: Payer: Self-pay | Admitting: Cardiovascular Disease

## 2020-07-05 ENCOUNTER — Other Ambulatory Visit: Payer: Self-pay | Admitting: Cardiovascular Disease

## 2020-07-27 NOTE — Progress Notes (Signed)
Cardiology Office Note  Date:  07/29/2020   ID:  [name] [name], DOB 03-Sep-1946, MRN [id number]  PCP:  Cletis Athens, MD   Chief Complaint  Patient presents with  . Other    6 month follow up. Patient c.o chest pain every now and then. Meds reviewed verbally with patient.     HPI:  [name] [name] is a 74 year old gentleman with  coronary artery disease,  occluded left circumflex, severely diseased proximal RCA with stent placed December 2009, with no intervention performed on the circumflex secondary to significant collateral circulation,  who continues to smoke one pack per day,  hyperlipidemia,  episodes of chest pain who previously worked in trucking, now retired,  who presents for routine follow up of his coronary artery disease.   Lots of stress at home, taking care of father now 52 years old Goes over to sit with father every evening Shares time with brother who does the mornings  Has not seen [name] [name] Followed by endocrine, [facility]   Labs reviewed HBA1C 8.0 BMP normal, elevated glucose Total chol 94,LDL 28   still smoking, no desire to quit  EKG personally reviewed by myself on todays visit nsr rate 67 bpm  , no significant ST or T wave changes  Other past medical history reviewed Previous episode hospital admission 2016 for fatigue found to have glucose levels more than 400  Status post neck surgery, Lost significant weight Previously had  occasional episodes of dizziness.   PMH:   has a past medical history of Bradycardia, Coronary artery disease, Diabetes mellitus without complication ([place]), Hyperlipidemia, Hypertension, and Nephrolithiasis.  PSH:    Past Surgical History:  Procedure Laterality Date  . CATARACT EXTRACTION    . KIDNEY STONE SURGERY    . NECK SURGERY  Feb 2013  . nephrolithiasis    . stent (other)      Current Outpatient Medications  Medication Sig Dispense Refill  . aspirin 81 MG EC tablet Take 81 mg by mouth daily.      Marland Kitchen  atorvastatin (LIPITOR) 40 MG tablet TAKE 1 TABLET BY MOUTH EVERY DAY 90 tablet 3  . clopidogrel (PLAVIX) 75 MG tablet TAKE 1 TABLET BY MOUTH EVERY DAY 90 tablet 1  . famotidine (PEPCID) 40 MG tablet TAKE 1 TABLET BY MOUTH EVERY DAY 90 tablet 0  . glimepiride (AMARYL) 4 MG tablet Take by mouth.    . isosorbide mononitrate (IMDUR) 30 MG 24 hr tablet TAKE 1 TABLET BY MOUTH EVERY DAY 90 tablet 3  . metFORMIN (GLUCOPHAGE) 1000 MG tablet Take 1,000 mg by mouth 2 (two) times daily with a meal.     . nitroGLYCERIN (NITROSTAT) 0.4 MG SL tablet Place 1 tablet (0.4 mg total) under the tongue every 5 (five) minutes as needed for chest pain. 25 tablet 6   No current facility-administered medications for this visit.    Allergies:   Patient has no known allergies.   Social History:  The patient  reports that he has been smoking cigarettes. He has a 18.00 pack-year smoking history. He has never used smokeless tobacco. He reports that he does not drink alcohol and does not use drugs.   Family History:   Father had CAD, CABG age 6s   Review of Systems: Review of Systems  Constitutional: Negative.   HENT: Negative.   Respiratory: Negative.   Cardiovascular: Negative.   Gastrointestinal: Negative.   Musculoskeletal: Negative.   Neurological: Negative.   Psychiatric/Behavioral: Negative.   All other systems reviewed  and are negative.   PHYSICAL EXAM: VS:  BP 124/74 (BP Location: Left Arm, Patient Position: Sitting, Cuff Size: Normal)   Pulse 67   Ht 6' (1.829 m)   Wt 190 lb (86.2 kg)   SpO2 97%   BMI 25.77 kg/m  , BMI Body mass index is 25.77 kg/m.  Constitutional:  oriented to person, place, and time. No distress.  HENT:  Head: Grossly normal Eyes:  no discharge. No scleral icterus.  Neck: No JVD, no carotid bruits  Cardiovascular: Regular rate and rhythm, no murmurs appreciated Pulmonary/Chest: Clear to auscultation bilaterally, no wheezes or rails Abdominal: Soft.  no distension.  no  tenderness.  Musculoskeletal: Normal range of motion Neurological:  normal muscle tone. Coordination normal. No atrophy Skin: Skin warm and dry Psychiatric: normal affect, pleasant   Recent Labs: No results found for requested labs within last 8760 hours.    Lipid Panel Lab Results  Component Value Date   CHOL 102 10/19/2017   HDL 30 (L) 10/19/2017   LDLCALC 40 10/19/2017   TRIG 158 (H) 10/19/2017      Wt Readings from Last 3 Encounters:  07/29/20 190 lb (86.2 kg)  01/24/20 193 lb 12 oz (87.9 kg)  11/28/18 183 lb (83 kg)     ASSESSMENT AND PLAN:  Atherosclerosis of native coronary artery without angina pectoris, unspecified whether native or transplanted heart -  Currently with no symptoms of angina. No further workup at this time. Continue current medication regimen.  TOBACCO ABUSE Still smoking We have encouraged him to continue to work on weaning his cigarettes and smoking cessation. He will continue to work on this and does not want any assistance with chantix.   Uncontrolled type 2 diabetes mellitus with hyperosmolarity without coma, without long-term current use of insulin (HCC) HBA1C elevated, he has follow-up with endocrine We have encouraged continued exercise, careful diet management   Mixed hyperlipidemia Cholesterol is at goal on the current lipid regimen. No changes to the medications were made.  Essential hypertension Blood pressure is well controlled on today's visit. No changes made to the medications.  Adjustment disorder Significant stress taking care of her father every evening, father 71 years old No time for himself in retirement   Total encounter time more than 25 minutes  Greater than 50% was spent in counseling and coordination of care with the patient    No orders of the defined types were placed in this encounter.    Signed, Esmond Plants, M.D., Ph.D. 07/29/2020  [place], [place]

## 2020-07-29 ENCOUNTER — Ambulatory Visit: Payer: Medicare HMO | Admitting: Cardiovascular Disease

## 2020-07-29 ENCOUNTER — Encounter: Payer: Self-pay | Admitting: Cardiovascular Disease

## 2020-07-29 ENCOUNTER — Other Ambulatory Visit: Payer: Self-pay

## 2020-07-29 VITALS — BP 124/74 | HR 67 | Ht 72.0 in | Wt 190.0 lb

## 2020-07-29 DIAGNOSIS — I10 Essential (primary) hypertension: Secondary | ICD-10-CM | POA: Diagnosis not present

## 2020-07-29 DIAGNOSIS — F17200 Nicotine dependence, unspecified, uncomplicated: Secondary | ICD-10-CM

## 2020-07-29 DIAGNOSIS — E1100 Type 2 diabetes mellitus with hyperosmolarity without nonketotic hyperglycemic-hyperosmolar coma (NKHHC): Secondary | ICD-10-CM | POA: Diagnosis not present

## 2020-07-29 DIAGNOSIS — I25118 Atherosclerotic heart disease of native coronary artery with other forms of angina pectoris: Secondary | ICD-10-CM | POA: Diagnosis not present

## 2020-07-29 DIAGNOSIS — E782 Mixed hyperlipidemia: Secondary | ICD-10-CM

## 2020-07-29 NOTE — Patient Instructions (Signed)
Medication Instructions:  No changes  If you need a refill on your cardiac medications before your next appointment, please call your pharmacy.    Lab work: No new labs needed   If you have labs (blood work) drawn today and your tests are completely normal, you will receive your results only by: . MyChart Message (if you have MyChart) OR . A paper copy in the mail If you have any lab test that is abnormal or we need to change your treatment, we will call you to review the results.   Testing/Procedures: No new testing needed   Follow-Up: At [facility], you and your health needs are our priority.  As part of our continuing mission to provide you with exceptional heart care, we have created designated Provider Care Teams.  These Care Teams include your primary Cardiologist (physician) and Advanced Practice Providers (APPs -  Physician Assistants and Nurse Practitioners) who all work together to provide you with the care you need, when you need it.  . You will need a follow up appointment in 12 months  . Providers on your designated Care Team:   . Christopher Berge, NP . Ryan Dunn, PA-C . Jacquelyn Visser, PA-C  Any Other Special Instructions Will Be Listed Below (If Applicable).  COVID-19 Vaccine Information can be found at: [email or web address] For questions related to vaccine distribution or appointments, please email [email or web address] or call [phone].     

## 2020-08-02 ENCOUNTER — Other Ambulatory Visit: Payer: Self-pay | Admitting: Cardiovascular Disease

## 2020-08-29 DIAGNOSIS — E1142 Type 2 diabetes mellitus with diabetic polyneuropathy: Secondary | ICD-10-CM | POA: Diagnosis not present

## 2020-08-29 DIAGNOSIS — E1169 Type 2 diabetes mellitus with other specified complication: Secondary | ICD-10-CM | POA: Diagnosis not present

## 2020-08-29 DIAGNOSIS — E1159 Type 2 diabetes mellitus with other circulatory complications: Secondary | ICD-10-CM | POA: Diagnosis not present

## 2020-08-29 DIAGNOSIS — I152 Hypertension secondary to endocrine disorders: Secondary | ICD-10-CM | POA: Diagnosis not present

## 2020-08-29 DIAGNOSIS — E785 Hyperlipidemia, unspecified: Secondary | ICD-10-CM | POA: Diagnosis not present

## 2020-09-06 ENCOUNTER — Other Ambulatory Visit: Payer: Self-pay | Admitting: Cardiovascular Disease

## 2020-09-30 ENCOUNTER — Other Ambulatory Visit: Payer: Self-pay | Admitting: Cardiovascular Disease

## 2020-11-22 DIAGNOSIS — R6889 Other general symptoms and signs: Secondary | ICD-10-CM | POA: Diagnosis not present

## 2020-11-22 DIAGNOSIS — Z03818 Encounter for observation for suspected exposure to other biological agents ruled out: Secondary | ICD-10-CM | POA: Diagnosis not present

## 2020-11-22 DIAGNOSIS — J019 Acute sinusitis, unspecified: Secondary | ICD-10-CM | POA: Diagnosis not present

## 2021-01-02 DIAGNOSIS — E1142 Type 2 diabetes mellitus with diabetic polyneuropathy: Secondary | ICD-10-CM | POA: Diagnosis not present

## 2021-01-02 DIAGNOSIS — E1159 Type 2 diabetes mellitus with other circulatory complications: Secondary | ICD-10-CM | POA: Diagnosis not present

## 2021-01-02 DIAGNOSIS — I152 Hypertension secondary to endocrine disorders: Secondary | ICD-10-CM | POA: Diagnosis not present

## 2021-01-02 DIAGNOSIS — E785 Hyperlipidemia, unspecified: Secondary | ICD-10-CM | POA: Diagnosis not present

## 2021-01-02 DIAGNOSIS — E1169 Type 2 diabetes mellitus with other specified complication: Secondary | ICD-10-CM | POA: Diagnosis not present

## 2021-06-03 DIAGNOSIS — E785 Hyperlipidemia, unspecified: Secondary | ICD-10-CM | POA: Diagnosis not present

## 2021-06-03 DIAGNOSIS — E1142 Type 2 diabetes mellitus with diabetic polyneuropathy: Secondary | ICD-10-CM | POA: Diagnosis not present

## 2021-06-03 DIAGNOSIS — E1169 Type 2 diabetes mellitus with other specified complication: Secondary | ICD-10-CM | POA: Diagnosis not present

## 2021-06-03 DIAGNOSIS — I152 Hypertension secondary to endocrine disorders: Secondary | ICD-10-CM | POA: Diagnosis not present

## 2021-06-03 DIAGNOSIS — E1159 Type 2 diabetes mellitus with other circulatory complications: Secondary | ICD-10-CM | POA: Diagnosis not present

## 2021-07-04 ENCOUNTER — Other Ambulatory Visit: Payer: Self-pay | Admitting: Cardiovascular Disease

## 2021-07-23 ENCOUNTER — Other Ambulatory Visit: Payer: Self-pay | Admitting: Cardiovascular Disease

## 2021-07-23 NOTE — Telephone Encounter (Signed)
Please schedule 12 month F/U appointment. Thank you! 

## 2021-07-24 NOTE — Telephone Encounter (Signed)
Attempted to schedule no answer no VM

## 2021-07-31 ENCOUNTER — Telehealth: Payer: Self-pay | Admitting: Cardiovascular Disease

## 2021-08-08 NOTE — Telephone Encounter (Signed)
Attempted to schedule no ans no vm  

## 2021-08-11 NOTE — Telephone Encounter (Signed)
*  STAT* If patient is at the pharmacy, call can be transferred to refill team.   1. Which medications need to be refilled? (please list name of each medication and dose if known) famotidine 40 MG 1 tablet daily   2. Which pharmacy/location (including street and city if local pharmacy) is medication to be sent to? [facility] [place]  3. Do they need a 30 day or 90 day supply? 90 day  Patient scheduled 09/30 with Dr [name]

## 2021-08-12 NOTE — Telephone Encounter (Signed)
Pt is aware to contact pcp or purchase over the counter.

## 2021-08-12 NOTE — Telephone Encounter (Signed)
Please advise if ok to refill Pepcid for 90 days.

## 2021-08-13 NOTE — Progress Notes (Signed)
Cardiology Office Note  Date:  08/15/2021   ID:  Odes Lolli, DOB [birth year], MRN [id number]  PCP:  P.A., Cletis [place], MD   Chief Complaint  Patient presents with   12 month follow up     "Doing well." Medications reviewed by the patient verbally.     HPI:  [name] is a 75 year old gentleman with  coronary artery disease,  occluded left circumflex, severely diseased proximal RCA with stent placed December 2009, with no intervention performed on the circumflex secondary to significant collateral circulation,  who continues to smoke one pack per day,  hyperlipidemia,  episodes of chest pain who previously worked in trucking, now retired,  who presents for routine follow up of his coronary artery disease.   Takes care of father age 38 yo [place] ,weedeats  Goes over to sit with father every evening Shares time with brother   Followed by endocrine, [facility]   Labs reviewed HBA1C 7.0, down 8.0  BMP normal, elevated glucose Total chol 110, LDL 45   still smoking, no desire to quit  EKG personally reviewed by myself on todays visit nsr rate 59 bpm  , no significant ST or T wave changes  Other past medical history reviewed Previous episode hospital admission 2016 for fatigue found to have glucose levels more than 400   Status post neck surgery, Lost significant weight Previously had  occasional episodes of dizziness.   PMH:   has a past medical history of Bradycardia, Coronary artery disease, Diabetes mellitus without complication ([place]), Hyperlipidemia, Hypertension, and Nephrolithiasis.  PSH:    Past Surgical History:  Procedure Laterality Date   CATARACT EXTRACTION     KIDNEY STONE SURGERY     NECK SURGERY  Feb 2013   nephrolithiasis     stent (other)      Current Outpatient Medications  Medication Sig Dispense Refill   aspirin 81 MG EC tablet Take 81 mg by mouth daily.       clopidogrel (PLAVIX) 75 MG tablet TAKE 1 TABLET BY MOUTH EVERY DAY 90 tablet 3    glimepiride (AMARYL) 4 MG tablet Take by mouth.     isosorbide mononitrate (IMDUR) 30 MG 24 hr tablet TAKE 1 TABLET BY MOUTH EVERY DAY 90 tablet 3   metFORMIN (GLUCOPHAGE) 1000 MG tablet Take 1,000 mg by mouth 2 (two) times daily with a meal.      nitroGLYCERIN (NITROSTAT) 0.4 MG SL tablet Place 1 tablet (0.4 mg total) under the tongue every 5 (five) minutes as needed for chest pain. 25 tablet 6   atorvastatin (LIPITOR) 40 MG tablet Take 1 tablet (40 mg total) by mouth daily. 90 tablet 3   famotidine (PEPCID) 40 MG tablet Take 1 tablet (40 mg total) by mouth daily. 90 tablet 3   No current facility-administered medications for this visit.    Allergies:   Patient has no known allergies.   Social History:  The patient  reports that he has been smoking cigarettes. He has a 18.00 pack-year smoking history. He has never used smokeless tobacco. He reports that he does not drink alcohol and does not use drugs.   Family History:   Father had CAD, CABG age 78s   Review of Systems: Review of Systems  Constitutional: Negative.   HENT: Negative.    Respiratory: Negative.    Cardiovascular: Negative.   Gastrointestinal: Negative.   Musculoskeletal: Negative.   Neurological: Negative.   Psychiatric/Behavioral: Negative.    All other systems reviewed and are  negative.  PHYSICAL EXAM: VS:  BP (!) 160/64 (BP Location: Left Arm, Patient Position: Sitting, Cuff Size: Normal)   Pulse (!) 59   Ht 6' (1.829 m)   Wt 193 lb 2 oz (87.6 kg)   SpO2 98%   BMI 26.19 kg/m  , BMI Body mass index is 26.19 kg/m.  Constitutional:  oriented to person, place, and time. No distress.  HENT:  Head: Grossly normal Eyes:  no discharge. No scleral icterus.  Neck: No JVD, no carotid bruits  Cardiovascular: Regular rate and rhythm, no murmurs appreciated Pulmonary/Chest: Clear to auscultation bilaterally, no wheezes or rails Abdominal: Soft.  no distension.  no tenderness.  Musculoskeletal: Normal range of  motion Neurological:  normal muscle tone. Coordination normal. No atrophy Skin: Skin warm and dry Psychiatric: normal affect, pleasant  Recent Labs: No results found for requested labs within last 8760 hours.    Lipid Panel Lab Results  Component Value Date   CHOL 102 10/19/2017   HDL 30 (L) 10/19/2017   LDLCALC 40 10/19/2017   TRIG 158 (H) 10/19/2017      Wt Readings from Last 3 Encounters:  08/15/21 193 lb 2 oz (87.6 kg)  07/29/20 190 lb (86.2 kg)  01/24/20 193 lb 12 oz (87.9 kg)     ASSESSMENT AND PLAN:  Atherosclerosis of native coronary artery without angina pectoris, unspecified whether native or transplanted heart -  Currently with no symptoms of angina. No further workup at this time. Continue current medication regimen.  TOBACCO ABUSE Still smoking We have encouraged him to continue to work on weaning his cigarettes and smoking cessation. He will continue to work on this and does not want any assistance with chantix.   Uncontrolled type 2 diabetes mellitus with hyperosmolarity without coma, without long-term current use of insulin (HCC) Improved HBA1C , followed by endocrine  Mixed hyperlipidemia Cholesterol is at goal on the current lipid regimen. No changes to the medications were made.  Essential hypertension Blood pressure is well controlled on today's visit. No changes made to the medications.  Adjustment disorder Significant stress taking care of her father every evening, father 21 years old No time for himself in retirement   Total encounter time more than 25 minutes  Greater than 50% was spent in counseling and coordination of care with the patient    Orders Placed This Encounter  Procedures   EKG 12-Lead      Signed, Esmond Plants, M.D., Ph.D. 08/15/2021  [place], [place]

## 2021-08-15 ENCOUNTER — Ambulatory Visit: Payer: Medicare HMO | Admitting: Cardiovascular Disease

## 2021-08-15 ENCOUNTER — Other Ambulatory Visit: Payer: Self-pay

## 2021-08-15 ENCOUNTER — Encounter: Payer: Self-pay | Admitting: Cardiovascular Disease

## 2021-08-15 ENCOUNTER — Other Ambulatory Visit: Payer: Self-pay | Admitting: Cardiovascular Disease

## 2021-08-15 VITALS — BP 160/64 | HR 59 | Ht 72.0 in | Wt 193.1 lb

## 2021-08-15 DIAGNOSIS — I25118 Atherosclerotic heart disease of native coronary artery with other forms of angina pectoris: Secondary | ICD-10-CM

## 2021-08-15 DIAGNOSIS — I10 Essential (primary) hypertension: Secondary | ICD-10-CM

## 2021-08-15 DIAGNOSIS — E1100 Type 2 diabetes mellitus with hyperosmolarity without nonketotic hyperglycemic-hyperosmolar coma (NKHHC): Secondary | ICD-10-CM | POA: Diagnosis not present

## 2021-08-15 DIAGNOSIS — F17200 Nicotine dependence, unspecified, uncomplicated: Secondary | ICD-10-CM

## 2021-08-15 DIAGNOSIS — E782 Mixed hyperlipidemia: Secondary | ICD-10-CM

## 2021-08-15 MED ORDER — ATORVASTATIN CALCIUM 40 MG PO TABS: 40.0000 mg | ORAL_TABLET | Freq: Every day | ORAL | 3 refills | Status: DC

## 2021-08-15 MED ORDER — FAMOTIDINE 40 MG PO TABS: 40.0000 mg | ORAL_TABLET | Freq: Every day | ORAL | 3 refills | Status: DC

## 2021-08-15 NOTE — Patient Instructions (Signed)
Please monitor blood pressure at home and call with numbers   Medication Instructions:  No changes  If you need a refill on your cardiac medications before your next appointment, please call your pharmacy.    Lab work: No new labs needed   If you have labs (blood work) drawn today and your tests are completely normal, you will receive your results only by: [place] (if you have MyChart) OR A paper copy in the mail If you have any lab test that is abnormal or we need to change your treatment, we will call you to review the results.   Testing/Procedures: No new testing needed   Follow-Up: At [facility], you and your health needs are our priority.  As part of our continuing mission to provide you with exceptional heart care, we have created designated Provider Care Teams.  These Care Teams include your primary Cardiologist (physician) and Advanced Practice Providers (APPs -  Physician Assistants and Nurse Practitioners) who all work together to provide you with the care you need, when you need it.  You will need a follow up appointment in 12 months  Providers on your designated Care Team:   Murray Hodgkins, NP Christell Faith, PA-C Marrianne Mood, PA-C Cadence Kathlen Mody, [place]  Any Other Special Instructions Will Be Listed Below (If Applicable).  COVID-19 Vaccine Information can be found at: [email or web address].uk For questions related to vaccine distribution or appointments, please email [email or web address] or call [phone].

## 2021-08-15 NOTE — Telephone Encounter (Signed)
Patient has appointment today. Will you refill at appt if appropriate please? Thank you!

## 2021-08-16 ENCOUNTER — Telehealth: Payer: Self-pay | Admitting: Cardiovascular Disease

## 2021-08-18 ENCOUNTER — Other Ambulatory Visit: Payer: Self-pay | Admitting: Cardiovascular Disease

## 2021-08-18 ENCOUNTER — Other Ambulatory Visit: Payer: Self-pay | Admitting: *Deleted

## 2021-08-18 NOTE — Telephone Encounter (Signed)
Patient came by office to check on status famotidine medication Patient states he will try and check with PCP to see if they can fill it if we will not

## 2021-08-18 NOTE — Telephone Encounter (Signed)
Please advise if ok to refill non cardiac medication. 

## 2021-08-19 NOTE — Telephone Encounter (Signed)
Pt made aware

## 2021-08-19 NOTE — Telephone Encounter (Signed)
Refill Denied pt needs to contact PCP for refill.

## 2021-08-19 NOTE — Telephone Encounter (Signed)
Pt is wanting [place] to fill his Pepcid. Pt mentioned that Dr. [name] has been filling his medication for several years and that it doesn't make sense that now all of a sudden he don't want to fill the medications. He hasn't seen his pcp since 2016 and can't go to his PCP for this refill because he hasn't been seen in several years. Pt mentioned that Dr. [name] originally prescribed this medication.

## 2021-08-25 ENCOUNTER — Other Ambulatory Visit: Payer: Self-pay | Admitting: Cardiovascular Disease

## 2021-11-12 DIAGNOSIS — E785 Hyperlipidemia, unspecified: Secondary | ICD-10-CM | POA: Diagnosis not present

## 2021-11-12 DIAGNOSIS — E1169 Type 2 diabetes mellitus with other specified complication: Secondary | ICD-10-CM | POA: Diagnosis not present

## 2021-11-12 DIAGNOSIS — I152 Hypertension secondary to endocrine disorders: Secondary | ICD-10-CM | POA: Diagnosis not present

## 2021-11-12 DIAGNOSIS — E1142 Type 2 diabetes mellitus with diabetic polyneuropathy: Secondary | ICD-10-CM | POA: Diagnosis not present

## 2021-11-12 DIAGNOSIS — E1159 Type 2 diabetes mellitus with other circulatory complications: Secondary | ICD-10-CM | POA: Diagnosis not present

## 2021-11-13 ENCOUNTER — Other Ambulatory Visit: Payer: Self-pay | Admitting: Cardiovascular Disease

## 2021-12-24 ENCOUNTER — Other Ambulatory Visit: Payer: Self-pay | Admitting: Cardiovascular Disease

## 2022-03-18 DIAGNOSIS — E119 Type 2 diabetes mellitus without complications: Secondary | ICD-10-CM | POA: Diagnosis not present

## 2022-04-02 DIAGNOSIS — E1142 Type 2 diabetes mellitus with diabetic polyneuropathy: Secondary | ICD-10-CM | POA: Diagnosis not present

## 2022-04-02 DIAGNOSIS — E1159 Type 2 diabetes mellitus with other circulatory complications: Secondary | ICD-10-CM | POA: Diagnosis not present

## 2022-04-02 DIAGNOSIS — Z79899 Other long term (current) drug therapy: Secondary | ICD-10-CM | POA: Diagnosis not present

## 2022-04-02 DIAGNOSIS — I152 Hypertension secondary to endocrine disorders: Secondary | ICD-10-CM | POA: Diagnosis not present

## 2022-04-02 DIAGNOSIS — E1169 Type 2 diabetes mellitus with other specified complication: Secondary | ICD-10-CM | POA: Diagnosis not present

## 2022-04-02 DIAGNOSIS — E785 Hyperlipidemia, unspecified: Secondary | ICD-10-CM | POA: Diagnosis not present

## 2022-06-22 ENCOUNTER — Other Ambulatory Visit: Payer: Self-pay | Admitting: Cardiovascular Disease

## 2022-07-26 ENCOUNTER — Inpatient Hospital Stay
Admission: EM | Admit: 2022-07-26 | Discharge: 2022-07-31 | DRG: 331 | Disposition: A | Discharge status: Home Health | Payer: Medicare HMO | Attending: Hospitalist | Admitting: Hospitalist

## 2022-07-26 ENCOUNTER — Emergency Department: Payer: Medicare HMO

## 2022-07-26 ENCOUNTER — Encounter: Payer: Self-pay | Admitting: Emergency Medicine

## 2022-07-26 ENCOUNTER — Other Ambulatory Visit: Payer: Self-pay

## 2022-07-26 DIAGNOSIS — I700 Atherosclerosis of aorta: Secondary | ICD-10-CM | POA: Diagnosis not present

## 2022-07-26 DIAGNOSIS — Z79899 Other long term (current) drug therapy: Secondary | ICD-10-CM

## 2022-07-26 DIAGNOSIS — Z7982 Long term (current) use of aspirin: Secondary | ICD-10-CM | POA: Diagnosis not present

## 2022-07-26 DIAGNOSIS — C19 Malignant neoplasm of rectosigmoid junction: Principal | ICD-10-CM | POA: Diagnosis present

## 2022-07-26 DIAGNOSIS — K56699 Other intestinal obstruction unspecified as to partial versus complete obstruction: Principal | ICD-10-CM

## 2022-07-26 DIAGNOSIS — E119 Type 2 diabetes mellitus without complications: Secondary | ICD-10-CM | POA: Diagnosis present

## 2022-07-26 DIAGNOSIS — Z8249 Family history of ischemic heart disease and other diseases of the circulatory system: Secondary | ICD-10-CM

## 2022-07-26 DIAGNOSIS — Y838 Other surgical procedures as the cause of abnormal reaction of the patient, or of later complication, without mention of misadventure at the time of the procedure: Secondary | ICD-10-CM | POA: Diagnosis not present

## 2022-07-26 DIAGNOSIS — D630 Anemia in neoplastic disease: Secondary | ICD-10-CM | POA: Diagnosis present

## 2022-07-26 DIAGNOSIS — K6289 Other specified diseases of anus and rectum: Secondary | ICD-10-CM | POA: Diagnosis not present

## 2022-07-26 DIAGNOSIS — R634 Abnormal weight loss: Secondary | ICD-10-CM | POA: Diagnosis present

## 2022-07-26 DIAGNOSIS — E875 Hyperkalemia: Secondary | ICD-10-CM | POA: Diagnosis present

## 2022-07-26 DIAGNOSIS — Z6824 Body mass index (BMI) 24.0-24.9, adult: Secondary | ICD-10-CM | POA: Diagnosis not present

## 2022-07-26 DIAGNOSIS — K56609 Unspecified intestinal obstruction, unspecified as to partial versus complete obstruction: Secondary | ICD-10-CM | POA: Diagnosis not present

## 2022-07-26 DIAGNOSIS — E1142 Type 2 diabetes mellitus with diabetic polyneuropathy: Secondary | ICD-10-CM

## 2022-07-26 DIAGNOSIS — I152 Hypertension secondary to endocrine disorders: Secondary | ICD-10-CM | POA: Diagnosis present

## 2022-07-26 DIAGNOSIS — Z87442 Personal history of urinary calculi: Secondary | ICD-10-CM | POA: Diagnosis not present

## 2022-07-26 DIAGNOSIS — Z7902 Long term (current) use of antithrombotics/antiplatelets: Secondary | ICD-10-CM | POA: Diagnosis not present

## 2022-07-26 DIAGNOSIS — I2510 Atherosclerotic heart disease of native coronary artery without angina pectoris: Secondary | ICD-10-CM | POA: Diagnosis not present

## 2022-07-26 DIAGNOSIS — K5900 Constipation, unspecified: Secondary | ICD-10-CM | POA: Diagnosis not present

## 2022-07-26 DIAGNOSIS — I10 Essential (primary) hypertension: Secondary | ICD-10-CM | POA: Diagnosis not present

## 2022-07-26 DIAGNOSIS — K624 Stenosis of anus and rectum: Secondary | ICD-10-CM | POA: Diagnosis not present

## 2022-07-26 DIAGNOSIS — F17210 Nicotine dependence, cigarettes, uncomplicated: Secondary | ICD-10-CM | POA: Diagnosis present

## 2022-07-26 DIAGNOSIS — C20 Malignant neoplasm of rectum: Secondary | ICD-10-CM | POA: Diagnosis not present

## 2022-07-26 DIAGNOSIS — Z7984 Long term (current) use of oral hypoglycemic drugs: Secondary | ICD-10-CM | POA: Diagnosis not present

## 2022-07-26 DIAGNOSIS — N281 Cyst of kidney, acquired: Secondary | ICD-10-CM | POA: Diagnosis not present

## 2022-07-26 DIAGNOSIS — K9171 Accidental puncture and laceration of a digestive system organ or structure during a digestive system procedure: Secondary | ICD-10-CM | POA: Diagnosis not present

## 2022-07-26 DIAGNOSIS — E785 Hyperlipidemia, unspecified: Secondary | ICD-10-CM | POA: Diagnosis not present

## 2022-07-26 LAB — CBC WITH DIFFERENTIAL/PLATELET
Abs Immature Granulocytes: 0.03 10*3/uL (ref 0.00–0.07)
Basophils Absolute: 0.1 10*3/uL (ref 0.0–0.1)
Basophils Relative: 0 %
Eosinophils Absolute: 0.2 10*3/uL (ref 0.0–0.5)
Eosinophils Relative: 2 %
HCT: 37.2 % — ABNORMAL LOW (ref 39.0–52.0)
Hemoglobin: 11.5 g/dL — ABNORMAL LOW (ref 13.0–17.0)
Immature Granulocytes: 0 %
Lymphocytes Relative: 17 %
Lymphs Abs: 2 10*3/uL (ref 0.7–4.0)
MCH: 26.3 pg (ref 26.0–34.0)
MCHC: 30.9 g/dL (ref 30.0–36.0)
MCV: 85.1 fL (ref 80.0–100.0)
Monocytes Absolute: 1.1 10*3/uL — ABNORMAL HIGH (ref 0.1–1.0)
Monocytes Relative: 9 %
Neutro Abs: 8.5 10*3/uL — ABNORMAL HIGH (ref 1.7–7.7)
Neutrophils Relative %: 72 %
Platelets: 311 10*3/uL (ref 150–400)
RBC: 4.37 MIL/uL (ref 4.22–5.81)
RDW: 15.5 % (ref 11.5–15.5)
WBC: 11.9 10*3/uL — ABNORMAL HIGH (ref 4.0–10.5)
nRBC: 0 % (ref 0.0–0.2)

## 2022-07-26 LAB — COMPREHENSIVE METABOLIC PANEL
ALT: 19 U/L (ref 0–44)
AST: 36 U/L (ref 15–41)
Albumin: 4.1 g/dL (ref 3.5–5.0)
Alkaline Phosphatase: 82 U/L (ref 38–126)
Anion gap: 12 (ref 5–15)
BUN: 24 mg/dL — ABNORMAL HIGH (ref 8–23)
CO2: 21 mmol/L — ABNORMAL LOW (ref 22–32)
Calcium: 9.4 mg/dL (ref 8.9–10.3)
Chloride: 104 mmol/L (ref 98–111)
Creatinine, Ser: 1.23 mg/dL (ref 0.61–1.24)
GFR, Estimated: >60 mL/min (ref >60)
Glucose, Bld: 107 mg/dL — ABNORMAL HIGH (ref 70–99)
Potassium: 5.2 mmol/L — ABNORMAL HIGH (ref 3.5–5.1)
Sodium: 137 mmol/L (ref 135–145)
Total Bilirubin: 1.4 mg/dL — ABNORMAL HIGH (ref 0.3–1.2)
Total Protein: 7.3 g/dL (ref 6.5–8.1)

## 2022-07-26 LAB — HEMOGLOBIN A1C
Hgb A1c MFr Bld: 7.2 % — ABNORMAL HIGH (ref 4.8–5.6)
Mean Plasma Glucose: 159.94 mg/dL

## 2022-07-26 LAB — GLUCOSE, CAPILLARY: Glucose-Capillary: 107 mg/dL — ABNORMAL HIGH (ref 70–99)

## 2022-07-26 MED ORDER — MORPHINE SULFATE (PF) 2 MG/ML IV SOLN
2.0000 mg | INTRAVENOUS | Status: DC | PRN
  Administered 2022-07-26: 2 mg via INTRAVENOUS
  Filled 2022-07-26: qty 1

## 2022-07-26 MED ORDER — IOHEXOL 300 MG/ML  SOLN: 80.0000 mL | Freq: Once | INTRAMUSCULAR | Status: DC | PRN

## 2022-07-26 MED ORDER — MAGNESIUM HYDROXIDE 400 MG/5ML PO SUSP
960.0000 mL | Freq: Once | ORAL | Status: DC
  Filled 2022-07-26: qty 473

## 2022-07-26 MED ORDER — ACETAMINOPHEN 650 MG RE SUPP: 650.0000 mg | Freq: Four times a day (QID) | RECTAL | Status: DC | PRN

## 2022-07-26 MED ORDER — ONDANSETRON HCL 4 MG/2ML IJ SOLN
4.0000 mg | Freq: Four times a day (QID) | INTRAMUSCULAR | Status: DC | PRN
  Administered 2022-07-27: 4 mg via INTRAVENOUS
  Filled 2022-07-26: qty 2

## 2022-07-26 MED ORDER — MAGNESIUM HYDROXIDE 400 MG/5ML PO SUSP
960.0000 mL | Freq: Once | ORAL | Status: AC
  Administered 2022-07-27: 960 mL via RECTAL
  Filled 2022-07-26: qty 473

## 2022-07-26 MED ORDER — INSULIN ASPART 100 UNIT/ML IJ SOLN
0.0000 [IU] | INTRAMUSCULAR | Status: DC
  Administered 2022-07-27 – 2022-07-28 (×6): 3 [IU] via SUBCUTANEOUS
  Administered 2022-07-28: 2 [IU] via SUBCUTANEOUS
  Administered 2022-07-28: 3 [IU] via SUBCUTANEOUS
  Administered 2022-07-29: 2 [IU] via SUBCUTANEOUS
  Administered 2022-07-29: 5 [IU] via SUBCUTANEOUS
  Administered 2022-07-29: 2 [IU] via SUBCUTANEOUS
  Administered 2022-07-30: 3 [IU] via SUBCUTANEOUS
  Administered 2022-07-30: 2 [IU] via SUBCUTANEOUS
  Administered 2022-07-30: 3 [IU] via SUBCUTANEOUS
  Administered 2022-07-31 (×2): 2 [IU] via SUBCUTANEOUS
  Administered 2022-07-31: 3 [IU] via SUBCUTANEOUS
  Filled 2022-07-26 (×17): qty 1

## 2022-07-26 MED ORDER — ACETAMINOPHEN 325 MG PO TABS: 650.0000 mg | ORAL_TABLET | Freq: Four times a day (QID) | ORAL | Status: DC | PRN

## 2022-07-26 MED ORDER — SODIUM CHLORIDE 0.9 % IV SOLN: Freq: Once | INTRAVENOUS | Status: AC

## 2022-07-26 MED ORDER — IOHEXOL 350 MG/ML SOLN: 80.0000 mL | Freq: Once | INTRAVENOUS | Status: DC | PRN

## 2022-07-26 MED ORDER — ENOXAPARIN SODIUM 40 MG/0.4ML IJ SOSY
40.0000 mg | PREFILLED_SYRINGE | INTRAMUSCULAR | Status: DC
  Administered 2022-07-29 – 2022-07-31 (×3): 40 mg via SUBCUTANEOUS
  Filled 2022-07-26 (×3): qty 0.4

## 2022-07-26 MED ORDER — ONDANSETRON HCL 4 MG PO TABS
4.0000 mg | ORAL_TABLET | Freq: Four times a day (QID) | ORAL | Status: DC | PRN
Start: 2022-07-26 — End: 2022-07-27

## 2022-07-26 MED ORDER — IOHEXOL 350 MG/ML SOLN
80.0000 mL | Freq: Once | INTRAVENOUS | Status: AC | PRN
  Administered 2022-07-26: 80 mL via INTRAVENOUS

## 2022-07-26 MED ORDER — LACTATED RINGERS IV SOLN: INTRAVENOUS | Status: DC

## 2022-07-26 MED ORDER — HYDRALAZINE HCL 20 MG/ML IJ SOLN
5.0000 mg | Freq: Four times a day (QID) | INTRAMUSCULAR | Status: DC | PRN
Start: 2022-07-26 — End: 2022-07-27

## 2022-07-26 NOTE — Assessment & Plan Note (Signed)
Hydralazine IV prn while NPO

## 2022-07-26 NOTE — ED Provider Notes (Signed)
[facility] Provider Note    Event Date/Time   First MD Initiated Contact with Patient 07/26/22 1727     (approximate)   History   Constipation   HPI  [name] [name] is a 76 y.o. male who reports he has not had good stool in 2 weeks.  He has had a small stools a couple of times just a few pieces come out.  He has tried over-the-counter laxative and suppository without result.  His belly is somewhat distended and full.      Physical Exam   Triage Vital Signs: ED Triage Vitals  Enc Vitals Group     BP 07/26/22 1728 (!) 175/95     Pulse Rate 07/26/22 1728 95     Resp 07/26/22 1728 (!) 21     Temp 07/26/22 1728 (!) 97.5 F (36.4 C)     Temp Source 07/26/22 1728 Oral     SpO2 07/26/22 1728 98 %     Weight --      Height --      Head Circumference --      Peak Flow --      Pain Score 07/26/22 1723 7     Pain Loc --      Pain Edu? --      Excl. in [place]? --     Most recent vital signs: Vitals:   07/26/22 2130 07/26/22 2209  BP: (!) 152/82 139/71  Pulse: 89 84  Resp: 18 18  Temp:  97.9 F (36.6 C)  SpO2: 96% 100%    General: Awake, no distress.  CV:  Good peripheral perfusion.  Resp:  Normal effort.  Abd:  Some distention no tenderness.  Rectal: Soft stool in the vault   ED Results / Procedures / Treatments   Labs (all labs ordered are listed, but only abnormal results are displayed) Labs Reviewed  COMPREHENSIVE METABOLIC PANEL - Abnormal; Notable for the following components:      Result Value   Potassium 5.2 (*)    CO2 21 (*)    Glucose, Bld 107 (*)    BUN 24 (*)    Total Bilirubin 1.4 (*)    All other components within normal limits  CBC WITH DIFFERENTIAL/PLATELET - Abnormal; Notable for the following components:   WBC 11.9 (*)    Hemoglobin 11.5 (*)    HCT 37.2 (*)    Neutro Abs 8.5 (*)    Monocytes Absolute 1.1 (*)    All other components within normal limits  HEMOGLOBIN A1C - Abnormal; Notable for the following  components:   Hgb A1c MFr Bld 7.2 (*)    All other components within normal limits  GLUCOSE, CAPILLARY - Abnormal; Notable for the following components:   Glucose-Capillary 107 (*)    All other components within normal limits     EKG     RADIOLOGY CT of the abdomen pelvis read by radiology reviewed and interpreted by me shows a narrowing at the distal sigmoid with a lot of stool and gas backed up behind it.  I discussed this in detail with Dr. Haig Prophet on-call for GI who recommended repeated enemas in an attempt to try and clear this out.  He will try to do endoscopy tomorrow.  PROCEDURES:  Critical Care performed: Critical care time about 20 minutes.  This includes reviewing some of the patient's old records and the studies here especially the CT.  Additionally I discussed this with Dr. Haig Prophet and then the  hospitalist.  Procedures   MEDICATIONS ORDERED IN ED: Medications  enoxaparin (LOVENOX) injection 40 mg (has no administration in time range)  acetaminophen (TYLENOL) tablet 650 mg (has no administration in time range)    Or  acetaminophen (TYLENOL) suppository 650 mg (has no administration in time range)  ondansetron (ZOFRAN) tablet 4 mg (has no administration in time range)    Or  ondansetron (ZOFRAN) injection 4 mg (has no administration in time range)  insulin aspart (novoLOG) injection 0-15 Units ( Subcutaneous Not Given 07/26/22 2327)  lactated ringers infusion ( Intravenous New Bag/Given 07/26/22 2312)  morphine (PF) 2 MG/ML injection 2 mg (2 mg Intravenous Given 07/26/22 2307)  hydrALAZINE (APRESOLINE) injection 5 mg (has no administration in time range)  0.9 %  sodium chloride infusion (0 mLs Intravenous Stopped 07/26/22 2036)  iohexol (OMNIPAQUE) 350 MG/ML injection 80 mL (80 mLs Intravenous Contrast Given 07/26/22 1829)  sorbitol, milk of mag, mineral oil, glycerin (SMOG) enema (960 mLs Rectal Given 07/27/22 0005)     IMPRESSION / MDM / ASSESSMENT AND PLAN / ED  COURSE  I reviewed the triage vital signs and the nursing notes. Patient with colonic stricture.  This could be due to cancer or scarring.  Patient reports she had some trouble like this in the past a couple times and then had some bleeding and it went away.  The stricture appears to be a little bit far out to be hemorrhoids but again possibly there is some scarring there or possibly a cancer which I mentioned to the patient.    Patient's presentation is most consistent with acute presentation with potential threat to life or bodily function.  The patient is on the cardiac monitor to evaluate for evidence of arrhythmia and/or significant heart rate changes.  None were seen      FINAL CLINICAL IMPRESSION(S) / ED DIAGNOSES   Final diagnoses:  Colonic stricture ([place])     Rx / DC Orders   ED Discharge Orders     None        Note:  This document was prepared using Dragon voice recognition software and may include unintentional dictation errors.   Nena Polio, MD 07/27/22 [phone]

## 2022-07-26 NOTE — Assessment & Plan Note (Addendum)
Stricture of rectosigmoid junction on CT abdomen/pelvis Enemas ordered by ED provider on the advice of GI We will keep n.p.o. for possible procedure in the a.m. IV hydration, IV antiemetics Aspiration precautions

## 2022-07-26 NOTE — ED Triage Notes (Signed)
Pt to ED via POV c/o constipation x 2 weeks. Pt states that he has been using OCT medications without relief. Pt c/o dry mouth as well.

## 2022-07-26 NOTE — Assessment & Plan Note (Signed)
Sliding scale insulin coverage.  Hold home oral hypoglycemics of glimepiride and metformin

## 2022-07-26 NOTE — Assessment & Plan Note (Signed)
Patient denies chest pain Will hold aspirin, atorvastatin and Imdur for tonight while n.p.o. and resume after procedure in the a.m. Nitroglycerin sublingual as needed chest pain

## 2022-07-26 NOTE — H&P (Signed)
History and Physical    Patient: [name] [name] TSV:[id number] DOB: 1946/10/16 DOA: 07/26/2022 DOS: the patient was seen and examined on 07/26/2022 PCP: Patient, No Pcp Per  Patient coming from: Home  Chief Complaint:  Chief Complaint  Patient presents with   Constipation    HPI: [name] [name] is a 76 y.o. male with medical history significant for DM, CAD, HTN who presents to the ED with a 2-week history of constipation unresolved with multiple over-the-counter medications associated with colicky abdominal pain.  He denies nausea, vomiting, fever or chills or dysuria. ED course and data review: BP 175/95 with pulse 95 respirations 21 and O2 sat 98% on room air.  Labs with WBC 12,000, hemoglobin 11.5.  Potassium 5.2, total bilirubin 1.4.  CT abdomen and pelvis showing the following findings:  IMPRESSION: Short-segment area of narrowing within the distal sigmoid near the rectosigmoid junction. Cannot exclude partially obstructing stricture or malignancy. Proximal to this area, the colon is dilated with a large amount of gas and stool throughout the colon.  The ED provider spoke with GI, Dr. Haig Prophet who recommended enema and will see in the a.m.  Enema ordered and hospitalist consulted for admission.  Review of Systems: As mentioned in the history of present illness. All other systems reviewed and are negative.  Past Medical History:  Diagnosis Date   Bradycardia    Coronary artery disease    Diabetes mellitus without complication ([place])    Hyperlipidemia    Hypertension    Nephrolithiasis    Past Surgical History:  Procedure Laterality Date   CATARACT EXTRACTION     KIDNEY STONE SURGERY     NECK SURGERY  Feb 2013   nephrolithiasis     stent (other)     Social History:  reports that he has been smoking cigarettes. He has a 18.00 pack-year smoking history. He has never used smokeless tobacco. He reports that he does not drink alcohol and does not use drugs.  No  Known Allergies  Family History  Problem Relation Age of Onset   Heart disease Father     Prior to Admission medications   Medication Sig Start Date End Date Taking? Authorizing Provider  aspirin 81 MG EC tablet Take 81 mg by mouth daily.      [provider]  atorvastatin (LIPITOR) 40 MG tablet TAKE 1 TABLET BY MOUTH EVERY DAY 06/22/22   [name], MD  clopidogrel (PLAVIX) 75 MG tablet TAKE 1 TABLET BY MOUTH EVERY DAY 08/25/21   [name], MD  famotidine (PEPCID) 40 MG tablet Take 1 tablet (40 mg total) by mouth daily. 08/15/21   [name], MD  glimepiride (AMARYL) 4 MG tablet Take by mouth. 05/15/20 08/15/21  [provider]  isosorbide mononitrate (IMDUR) 30 MG 24 hr tablet TAKE 1 TABLET BY MOUTH EVERY DAY 08/18/21   [name], MD  metFORMIN (GLUCOPHAGE) 1000 MG tablet Take 1,000 mg by mouth 2 (two) times daily with a meal.  02/06/20 08/15/21  [provider]  nitroGLYCERIN (NITROSTAT) 0.4 MG SL tablet Place 1 tablet (0.4 mg total) under the tongue every 5 (five) minutes as needed for chest pain. 12/06/13   [name], MD    Physical Exam: Vitals:   07/26/22 1728 07/26/22 1730 07/26/22 1900 07/26/22 1915  BP: (!) 175/95 (!) 157/92  (!) 144/91  Pulse: 95 96 86 84  Resp: (!) '21 17  16  '$ Temp: (!) 97.5 F (36.4 C)  TempSrc: Oral     SpO2: 98% 98% 95% 95%   Physical Exam Vitals and nursing note reviewed.  Constitutional:      General: He is not in acute distress. HENT:     Head: Normocephalic and atraumatic.  Cardiovascular:     Rate and Rhythm: Normal rate and regular rhythm.     Heart sounds: Normal heart sounds.  Pulmonary:     Effort: Pulmonary effort is normal.     Breath sounds: Normal breath sounds.  Abdominal:     General: There is distension.     Palpations: Abdomen is soft.     Tenderness: There is no abdominal tenderness.  Neurological:     Mental Status: Mental status is at baseline.     Labs on  Admission: I have personally reviewed following labs and imaging studies  CBC: Recent Labs  Lab 07/26/22 1756  WBC 11.9*  NEUTROABS 8.5*  HGB 11.5*  HCT 37.2*  MCV 85.1  PLT 341   Basic Metabolic Panel: Recent Labs  Lab 07/26/22 1756  NA 137  K 5.2*  CL 104  CO2 21*  GLUCOSE 107*  BUN 24*  CREATININE 1.23  CALCIUM 9.4   GFR: CrCl cannot be calculated (Unknown ideal weight.). Liver Function Tests: Recent Labs  Lab 07/26/22 1756  AST 36  ALT 19  ALKPHOS 82  BILITOT 1.4*  PROT 7.3  ALBUMIN 4.1   No results for input(s): "LIPASE", "AMYLASE" in the last 168 hours. No results for input(s): "AMMONIA" in the last 168 hours. Coagulation Profile: No results for input(s): "INR", "PROTIME" in the last 168 hours. Cardiac Enzymes: No results for input(s): "CKTOTAL", "CKMB", "CKMBINDEX", "TROPONINI" in the last 168 hours. BNP (last 3 results) No results for input(s): "PROBNP" in the last 8760 hours. HbA1C: No results for input(s): "HGBA1C" in the last 72 hours. CBG: No results for input(s): "GLUCAP" in the last 168 hours. Lipid Profile: No results for input(s): "CHOL", "HDL", "LDLCALC", "TRIG", "CHOLHDL", "LDLDIRECT" in the last 72 hours. Thyroid Function Tests: No results for input(s): "TSH", "T4TOTAL", "FREET4", "T3FREE", "THYROIDAB" in the last 72 hours. Anemia Panel: No results for input(s): "VITAMINB12", "FOLATE", "FERRITIN", "TIBC", "IRON", "RETICCTPCT" in the last 72 hours. Urine analysis:    Component Value Date/Time   COLORURINE Yellow 12/20/2014 1419   APPEARANCEUR Clear 12/20/2014 1419   LABSPEC 1.036 12/20/2014 1419   PHURINE 5.0 12/20/2014 1419   GLUCOSEU >=500 12/20/2014 1419   HGBUR Negative 12/20/2014 1419   BILIRUBINUR Negative 12/20/2014 1419   KETONESUR 2+ 12/20/2014 1419   PROTEINUR Negative 12/20/2014 1419   NITRITE Negative 12/20/2014 1419   LEUKOCYTESUR Negative 12/20/2014 1419    Radiological Exams on Admission: CT ABDOMEN PELVIS W  CONTRAST  Result Date: 07/26/2022 CLINICAL DATA:  Abdominal pain, acute, nonlocalized No stool for 2 weeks EXAM: CT ABDOMEN AND PELVIS WITH CONTRAST TECHNIQUE: Multidetector CT imaging of the abdomen and pelvis was performed using the standard protocol following bolus administration of intravenous contrast. RADIATION DOSE REDUCTION: This exam was performed according to the departmental dose-optimization program which includes automated exposure control, adjustment of the mA and/or kV according to patient size and/or use of iterative reconstruction technique. CONTRAST:  26m OMNIPAQUE IOHEXOL 350 MG/ML SOLN COMPARISON:  09/14/2008 FINDINGS: Lower chest: No acute abnormality Hepatobiliary: No focal hepatic abnormality. Gallbladder unremarkable. Pancreas: No focal abnormality or ductal dilatation. Spleen: No focal abnormality.  Normal size. Adrenals/Urinary Tract: Bilateral renal parapelvic cysts. No follow-up imaging recommended. Scarring in the upper pole of the  right kidney. No hydronephrosis. Adrenal glands and urinary bladder unremarkable. Stomach/Bowel: Large stool burden and gaseous distention of the colon to the distal sigmoid colon/rectosigmoid junction. There is caliber change at this level. Cannot exclude colonic stricture or malignancy in this area. Normal appendix. Stomach and small bowel decompressed, unremarkable. Vascular/Lymphatic: Aortic atherosclerosis. No evidence of aneurysm or adenopathy. Reproductive: No visible focal abnormality. Other: No free fluid or free air. Musculoskeletal: No acute bony abnormality. IMPRESSION: Short-segment area of narrowing within the distal sigmoid near the rectosigmoid junction. Cannot exclude partially obstructing stricture or malignancy. Proximal to this area, the colon is dilated with a large amount of gas and stool throughout the colon. Aortic atherosclerosis. Electronically Signed   By: Rolm Baptise M.D.   On: 07/26/2022 18:57     Data Reviewed: Relevant  notes from primary care and specialist visits, past discharge summaries as available in EHR, including Care Everywhere. Prior diagnostic testing as pertinent to current admission diagnoses Updated medications and problem lists for reconciliation ED course, including vitals, labs, imaging, treatment and response to treatment Triage notes, nursing and pharmacy notes and ED provider's notes Notable results as noted in HPI   Assessment and Plan: * Partial Large bowel obstruction (HCC) Stricture of rectosigmoid junction on CT abdomen/pelvis Enemas ordered by ED provider on the advice of GI We will keep n.p.o. for possible procedure in the a.m. IV hydration, IV antiemetics Aspiration precautions  Diabetes mellitus without complication (HCC) Sliding scale insulin coverage.  Hold home oral hypoglycemics of glimepiride and metformin  Coronary artery disease Patient denies chest pain Will hold aspirin, atorvastatin and Imdur for tonight while n.p.o. and resume after procedure in the a.m. Nitroglycerin sublingual as needed chest pain  Essential hypertension Hydralazine IV prn while NPO        DVT prophylaxis: Lovenox  Consults: GI, Dr. Haig Prophet  Advance Care Planning: full code  Family Communication: none  Disposition Plan: Back to previous home environment  Severity of Illness: The appropriate patient status for this patient is INPATIENT. Inpatient status is judged to be reasonable and necessary in order to provide the required intensity of service to ensure the patient's safety. The patient's presenting symptoms, physical exam findings, and initial radiographic and laboratory data in the context of their chronic comorbidities is felt to place them at high risk for further clinical deterioration. Furthermore, it is not anticipated that the patient will be medically stable for discharge from the hospital within 2 midnights of admission.   * I certify that at the point of admission  it is my clinical judgment that the patient will require inpatient hospital care spanning beyond 2 midnights from the point of admission due to high intensity of service, high risk for further deterioration and high frequency of surveillance required.*  Author: Athena Masse, MD 07/26/2022 8:14 PM  For on call review [email or web address].

## 2022-07-26 NOTE — ED Notes (Signed)
Pt presents to ED with c/o of being constipated, pt states he has not had a BM in 2 weeks, pt c/o of ABD pain. Pt states he has tried OTC medications that states have not worked. Pt denies N/V.   Pt also states he feels like he is dehydrated due to a dry mouth. Pt is A&Ox4 and is NAD.

## 2022-07-27 ENCOUNTER — Encounter
Admission: EM | Disposition: A | Discharge status: Home Health | Payer: Self-pay | Source: Home / Self Care | Attending: Hospitalist

## 2022-07-27 ENCOUNTER — Inpatient Hospital Stay: Payer: Medicare HMO | Admitting: Certified Registered Nurse Anesthetist

## 2022-07-27 ENCOUNTER — Other Ambulatory Visit: Payer: Self-pay

## 2022-07-27 ENCOUNTER — Encounter: Payer: Self-pay | Admitting: Internal Medicine

## 2022-07-27 DIAGNOSIS — K56609 Unspecified intestinal obstruction, unspecified as to partial versus complete obstruction: Secondary | ICD-10-CM | POA: Diagnosis not present

## 2022-07-27 HISTORY — PX: FLEXIBLE SIGMOIDOSCOPY: SHX5431

## 2022-07-27 LAB — GLUCOSE, CAPILLARY
Glucose-Capillary: 173 mg/dL — ABNORMAL HIGH (ref 70–99)
Glucose-Capillary: 193 mg/dL — ABNORMAL HIGH (ref 70–99)
Glucose-Capillary: 187 mg/dL — ABNORMAL HIGH (ref 70–99)
Glucose-Capillary: 196 mg/dL — ABNORMAL HIGH (ref 70–99)
Glucose-Capillary: 175 mg/dL — ABNORMAL HIGH (ref 70–99)

## 2022-07-27 SURGERY — SIGMOIDOSCOPY, FLEXIBLE
Anesthesia: General

## 2022-07-27 MED ORDER — PROPOFOL 10 MG/ML IV BOLUS
INTRAVENOUS | Status: DC | PRN
  Administered 2022-07-27: 100 mg via INTRAVENOUS
  Administered 2022-07-27: 50 mg via INTRAVENOUS

## 2022-07-27 MED ORDER — LIDOCAINE HCL (PF) 2 % IJ SOLN
INTRAMUSCULAR | Status: AC
  Filled 2022-07-27: qty 5

## 2022-07-27 MED ORDER — SODIUM CHLORIDE 0.9 % IV SOLN
12.5000 mg | Freq: Four times a day (QID) | INTRAVENOUS | Status: DC | PRN
  Administered 2022-07-27 – 2022-07-28 (×2): 12.5 mg via INTRAVENOUS
  Filled 2022-07-27 (×2): qty 12.5

## 2022-07-27 MED ORDER — SODIUM CHLORIDE 0.9 % IV SOLN: INTRAVENOUS | Status: DC | PRN

## 2022-07-27 MED ORDER — DEXAMETHASONE SODIUM PHOSPHATE 10 MG/ML IJ SOLN
INTRAMUSCULAR | Status: DC | PRN
  Administered 2022-07-27: 10 mg via INTRAVENOUS

## 2022-07-27 MED ORDER — HYDRALAZINE HCL 20 MG/ML IJ SOLN
10.0000 mg | Freq: Four times a day (QID) | INTRAMUSCULAR | Status: DC | PRN
Start: 2022-07-27 — End: 2022-07-31
  Administered 2022-07-27 (×2): 10 mg via INTRAVENOUS
  Filled 2022-07-27 (×2): qty 1

## 2022-07-27 MED ORDER — LIDOCAINE HCL (CARDIAC) PF 100 MG/5ML IV SOSY
PREFILLED_SYRINGE | INTRAVENOUS | Status: DC | PRN
  Administered 2022-07-27: 30 mg via INTRAVENOUS

## 2022-07-27 MED ORDER — PROPOFOL 500 MG/50ML IV EMUL: INTRAVENOUS | Status: DC | PRN

## 2022-07-27 MED ORDER — ONDANSETRON HCL 4 MG PO TABS
4.0000 mg | ORAL_TABLET | Freq: Four times a day (QID) | ORAL | Status: DC | PRN
Start: 2022-07-27 — End: 2022-07-31
  Administered 2022-07-27: 4 mg via ORAL

## 2022-07-27 MED ORDER — ONDANSETRON HCL 4 MG/2ML IJ SOLN
4.0000 mg | INTRAMUSCULAR | Status: DC | PRN
  Administered 2022-07-27 – 2022-07-28 (×3): 4 mg via INTRAVENOUS
  Filled 2022-07-27 (×3): qty 2

## 2022-07-27 MED ORDER — SUCCINYLCHOLINE CHLORIDE 200 MG/10ML IV SOSY
PREFILLED_SYRINGE | INTRAVENOUS | Status: DC | PRN
  Administered 2022-07-27: 100 mg via INTRAVENOUS

## 2022-07-27 MED ORDER — ONDANSETRON HCL 4 MG/2ML IJ SOLN
INTRAMUSCULAR | Status: AC
  Filled 2022-07-27: qty 2

## 2022-07-27 MED ORDER — SUCCINYLCHOLINE CHLORIDE 200 MG/10ML IV SOSY
PREFILLED_SYRINGE | INTRAVENOUS | Status: AC
  Filled 2022-07-27: qty 10

## 2022-07-27 MED ORDER — ORAL CARE MOUTH RINSE: 15.0000 mL | OROMUCOSAL | Status: DC | PRN

## 2022-07-27 MED ORDER — DEXAMETHASONE SODIUM PHOSPHATE 10 MG/ML IJ SOLN
INTRAMUSCULAR | Status: AC
  Filled 2022-07-27: qty 1

## 2022-07-27 NOTE — Progress Notes (Signed)
Pt only able to tol 1/2 of enema, notified Dr Damita Dunnings. Verbalized understanding.

## 2022-07-27 NOTE — Op Note (Signed)
[facility] Patient Name: [name] [name] Procedure Date: 07/27/2022 12:14 PM MRN: [id number] Account #: [id number] Date of Birth: 13-Aug-1946 Admit Type: Inpatient Age: 76 Room: [facility] ENDO ROOM 3 Gender: Male Note Status: Finalized Instrument Name: Peds Colonoscope [id number] Procedure:             Flexible Sigmoidoscopy Indications:           Abnormal CT of the GI tract Providers:             Andrey Farmer MD, MD Referring MD:          No Local Md, MD (Referring MD) Medicines:             General Anesthesia Complications:         No immediate complications. Estimated blood loss:                         Minimal. Procedure:             Pre-Anesthesia Assessment:                        - Prior to the procedure, a History and Physical was                         performed, and patient medications and allergies were                         reviewed. The patient is competent. The risks and                         benefits of the procedure and the sedation options and                         risks were discussed with the patient. All questions                         were answered and informed consent was obtained.                         Patient identification and proposed procedure were                         verified by the physician, the nurse, the                         anesthesiologist, the anesthetist and the technician                         in the endoscopy suite. Mental Status Examination:                         alert and oriented. Airway Examination: normal                         oropharyngeal airway and neck mobility. Respiratory                         Examination: clear to auscultation. CV Examination:  normal. Prophylactic Antibiotics: The patient does not                         require prophylactic antibiotics. Prior                         Anticoagulants: The patient has taken no previous                          anticoagulant or antiplatelet agents. ASA Grade                         Assessment: E - Emergency. After reviewing the risks                         and benefits, the patient was deemed in satisfactory                         condition to undergo the procedure. The anesthesia                         plan was to use general anesthesia. Immediately prior                         to administration of medications, the patient was                         re-assessed for adequacy to receive sedatives. The                         heart rate, respiratory rate, oxygen saturations,                         blood pressure, adequacy of pulmonary ventilation, and                         response to care were monitored throughout the                         procedure. The physical status of the patient was                         re-assessed after the procedure.                        After obtaining informed consent, the scope was passed                         under direct vision. The Colonoscope was introduced                         through the anus and advanced to the the rectum. The                         flexible sigmoidoscopy was accomplished without                         difficulty. The patient tolerated the procedure well.  The quality of the bowel preparation was fair. Findings:      The perianal and digital rectal examinations were normal.      A fungating completely obstructing large mass was found in the proximal       rectum. The mass was circumferential. This was about 10-12 cm from anal       verge. No bleeding was present. This was biopsied with a cold forceps       for histology. Estimated blood loss was minimal.      Localized severe mucosal changes characterized by altered vascularity,       friability, granularity and loss of vascularity were found in the       rectum. Biopsies were taken with a cold forceps for histology. Estimated       blood loss was  minimal.      Retroflexion in the rectum was not performed due to abnormal mucosa but       no obvious lesions seen on slow withdrawal. Impression:            - Preparation of the colon was fair.                        - Likely malignant completely obstructing tumor in the                         proximal rectum. Biopsied.                        - Localized severe mucosal changes were found in the                         rectum. Biopsied. Recommendation:        - Refer to a surgeon today.                        - Return patient to hospital ward for ongoing care.                        - Await pathology results. Procedure Code(s):     --- Professional ---                        [phone], 63, Sigmoidoscopy, flexible; with biopsy,                         single or multiple Diagnosis Code(s):     --- Professional ---                        D49.0, Neoplasm of unspecified behavior of digestive                         system                        K56.691, Other complete intestinal obstruction                        K62.89, Other specified diseases of anus and rectum                        R93.3, Abnormal findings on diagnostic imaging of  other parts of digestive tract CPT copyright 2019 American [employer] Association. All rights reserved. The codes documented in this report are preliminary and upon coder review may  be revised to meet current compliance requirements. Andrey Farmer MD, MD 07/27/2022 12:56:56 PM Number of Addenda: 0 Note Initiated On: 07/27/2022 12:14 PM Total Procedure Duration: 0 hours 8 minutes 0 seconds  Estimated Blood Loss:  Estimated blood loss was minimal.      [facility]

## 2022-07-27 NOTE — TOC Initial Note (Signed)
Transition of Care [facility]) - Initial/Assessment Note    Patient Details  Name: [name] [name] MRN: [id number] Date of Birth: November 13, 1946  Transition of Care [facility]) CM/SW Contact:    Beverly Sessions, RN Phone Number: 07/27/2022, 2:45 PM  Clinical Narrative:                   Transition of Care [facility]) Screening Note   Patient Details  Name: [name] [name] Date of Birth: [birth year]   Transition of Care [facility]) CM/SW Contact:    Beverly Sessions, RN Phone Number: 07/27/2022, 2:45 PM    Transition of Care Department [facility]) has reviewed patient and no TOC needs have been identified at this time. We will continue to monitor patient advancement through interdisciplinary progression rounds. If new patient transition needs arise, please place a TOC consult.         Patient Goals and CMS Choice        Expected Discharge Plan and Services                                                Prior Living Arrangements/Services                       Activities of Daily Living Home Assistive Devices/Equipment: None ADL Screening (condition at time of admission) Patient's cognitive ability adequate to safely complete daily activities?: Yes Is the patient deaf or have difficulty hearing?: No Does the patient have difficulty seeing, even when wearing glasses/contacts?: No Does the patient have difficulty concentrating, remembering, or making decisions?: Yes Patient able to express need for assistance with ADLs?: Yes Does the patient have difficulty dressing or bathing?: No Independently performs ADLs?: Yes (appropriate for developmental age) Does the patient have difficulty walking or climbing stairs?: Yes Weakness of Legs: None Weakness of Arms/Hands: None  Permission Sought/Granted                  Emotional Assessment              Admission diagnosis:  Stricture of rectum [K62.4] Patient Active Problem List   Diagnosis Date Noted    Stricture of rectosigmoid junction on CT. 07/26/2022   Diabetes mellitus without complication ([place])    Partial Large bowel obstruction ([place])    Back pain 12/04/2014   Hyperlipidemia 04/04/2010   TOBACCO ABUSE 04/04/2010   Essential hypertension 04/04/2010   Coronary artery disease 04/04/2010   PCP:  Gay Filler, DO Pharmacy:   [facility] [place] [address] Phone: [phone] Fax: [phone]  [id number] [place] [place] [id number]: [phone]: [phone]    Social Determinants of Health (SDOH) Interventions    Readmission Risk Interventions     No data to display

## 2022-07-27 NOTE — Anesthesia Postprocedure Evaluation (Signed)
Anesthesia Post Note  Patient: [name] [name]  Procedure(s) Performed: [place]  Patient location during evaluation: Endoscopy Anesthesia Type: General Level of consciousness: awake and alert Pain management: pain level controlled Vital Signs Assessment: post-procedure vital signs reviewed and stable Respiratory status: spontaneous breathing, nonlabored ventilation and respiratory function stable Cardiovascular status: blood pressure returned to baseline and stable Postop Assessment: no apparent nausea or vomiting Anesthetic complications: no   No notable events documented.   Last Vitals:  Vitals:   07/27/22 1315 07/27/22 1346  BP: (!) 173/77 (!) 161/85  Pulse: 88 90  Resp:  18  Temp:  36.7 C  SpO2: 100% 92%    Last Pain:  Vitals:   07/27/22 1346  TempSrc: Oral  PainSc:                  [place] Ouch

## 2022-07-27 NOTE — Transfer of Care (Signed)
Immediate Anesthesia Transfer of Care Note  Patient: [name] [name]  Procedure(s) Performed: FLEXIBLE SIGMOIDOSCOPY  Patient Location: Endoscopy Unit  Anesthesia Type:General  Level of Consciousness: drowsy  Airway & Oxygen Therapy: Patient Spontanous Breathing  Post-op Assessment: Report given to RN and Post -op Vital signs reviewed and stable  Post vital signs: Reviewed and stable  Last Vitals:  Vitals Value Taken Time  BP 130/68 07/27/22 1246  Temp 36.4 C 07/27/22 1245  Pulse 87 07/27/22 1247  Resp 22 07/27/22 1249  SpO2 96 % 07/27/22 1247  Vitals shown include unvalidated device data.  Last Pain:  Vitals:   07/27/22 1245  TempSrc: Temporal  PainSc: Asleep         Complications: No notable events documented.

## 2022-07-27 NOTE — Progress Notes (Signed)
Dr Sidney Ace notified patient nauseated and dryheaving. Not due for zofran until 1000.Waiting response

## 2022-07-27 NOTE — Progress Notes (Signed)
[facility] [place] NAME: [name] [name]    MR#:  [id number]  DATE OF BIRTH:  [birth year]  SUBJECTIVE:  patient came in with few weeks of progressive worsening dry heaves nausea and difficulty with constipation have a bowel movement. Evaluation the ER showed narrowing/stricture around director sigmoid area. Patient continues to have some dry heaves earlier. No vomiting.    VITALS:  Blood pressure (!) 161/85, pulse 90, temperature 98 F (36.7 C), temperature source Oral, resp. rate 18, height 6' (1.829 m), weight 81.3 kg, SpO2 92 %.  PHYSICAL EXAMINATION:   GENERAL:  76 y.o.-year-old patient lying in the bed with no acute distress.  LUNGS: Normal breath sounds bilaterally, no wheezing, rales, rhonchi.  CARDIOVASCULAR: S1, S2 normal. No murmurs, rubs, or gallops.  ABDOMEN: Soft, nontender, nondistended. Bowel sounds present.  EXTREMITIES: No  edema b/l.    NEUROLOGIC: nonfocal  patient is alert and awake week, debilitated SKIN: No obvious rash, lesion, or ulcer.   LABORATORY PANEL:  CBC Recent Labs  Lab 07/26/22 1756  WBC 11.9*  HGB 11.5*  HCT 37.2*  PLT 311    Chemistries  Recent Labs  Lab 07/26/22 1756  NA 137  K 5.2*  CL 104  CO2 21*  GLUCOSE 107*  BUN 24*  CREATININE 1.23  CALCIUM 9.4  AST 36  ALT 19  ALKPHOS 82  BILITOT 1.4*   Cardiac Enzymes No results for input(s): "TROPONINI" in the last 168 hours. RADIOLOGY:  CT ABDOMEN PELVIS W CONTRAST  Result Date: 07/26/2022 CLINICAL DATA:  Abdominal pain, acute, nonlocalized No stool for 2 weeks EXAM: CT ABDOMEN AND PELVIS WITH CONTRAST TECHNIQUE: Multidetector CT imaging of the abdomen and pelvis was performed using the standard protocol following bolus administration of intravenous contrast. RADIATION DOSE REDUCTION: This exam was performed according to the departmental dose-optimization program which includes automated exposure control, adjustment of the mA and/or kV  according to patient size and/or use of iterative reconstruction technique. CONTRAST:  89m OMNIPAQUE IOHEXOL 350 MG/ML SOLN COMPARISON:  09/14/2008 FINDINGS: Lower chest: No acute abnormality Hepatobiliary: No focal hepatic abnormality. Gallbladder unremarkable. Pancreas: No focal abnormality or ductal dilatation. Spleen: No focal abnormality.  Normal size. Adrenals/Urinary Tract: Bilateral renal parapelvic cysts. No follow-up imaging recommended. Scarring in the upper pole of the right kidney. No hydronephrosis. Adrenal glands and urinary bladder unremarkable. Stomach/Bowel: Large stool burden and gaseous distention of the colon to the distal sigmoid colon/rectosigmoid junction. There is caliber change at this level. Cannot exclude colonic stricture or malignancy in this area. Normal appendix. Stomach and small bowel decompressed, unremarkable. Vascular/Lymphatic: Aortic atherosclerosis. No evidence of aneurysm or adenopathy. Reproductive: No visible focal abnormality. Other: No free fluid or free air. Musculoskeletal: No acute bony abnormality. IMPRESSION: Short-segment area of narrowing within the distal sigmoid near the rectosigmoid junction. Cannot exclude partially obstructing stricture or malignancy. Proximal to this area, the colon is dilated with a large amount of gas and stool throughout the colon. Aortic atherosclerosis. Electronically Signed   By: KRolm BaptiseM.D.   On: 07/26/2022 18:57    Assessment and Plan LJoaquim [name] a 76y.o. male with medical history significant for DM, CAD, HTN who presents to the ED with a 2-week history of constipation unresolved with multiple over-the-counter medications associated with colicky abdominal pain  CT abdomen and pelvis showing the following findings: Short-segment area of narrowing within the distal sigmoid near the rectosigmoid junction. Cannot exclude partially obstructing stricture or  malignancy. Proximal to this area, the colon is  dilated with a large amount of gas and stool throughout the colon.  Partial Large bowel obstruction (HCC) Stricture of rectosigmoid junction on CT abdomen/pelvis -- patient underwent flex sigmoidoscopy which shows getting mass of the rectum. -- Dr. Haig Prophet has contacted Dr. Christian Mate from general surgery to see patient -- continue IV fluids --consider Oncology consult   Diabetes mellitus without complication ([place]) Sliding scale insulin coverage.   --Hold home oral hypoglycemics of glimepiride and metformin   Coronary artery disease --Patient denies chest pain --Will hold aspirin, atorvastatin and Imdur --Nitroglycerin sublingual as needed chest pain   Essential hypertension --Hydralazine IV prn while NP   Procedures: flex sigmoidoscopy Family communication : Consults : G.I., general surgery CODE STATUS: full DVT Prophylaxis : Level of care: Med-Surg Status is: Inpatient Remains inpatient appropriate because: large bowel obstruction secondary to rectal mass. Gen. surgery consultation pending    TOTAL TIME TAKING CARE OF THIS PATIENT: 35 minutes.  >50% time spent on counselling and coordination of care  Note: This dictation was prepared with Dragon dictation along with smaller phrase technology. Any transcriptional errors that result from this process are unintentional.  [name] [name] M.D    [facility]   CC: Primary care physician; Patient, No Pcp Per

## 2022-07-27 NOTE — Plan of Care (Signed)
°  Problem: Education: °Goal: Knowledge of General Education information will improve °Description: Including pain rating scale, medication(s)/side effects and non-pharmacologic comfort measures °Outcome: Progressing °  °Problem: Clinical Measurements: °Goal: Cardiovascular complication will be avoided °Outcome: Progressing °  °Problem: Activity: °Goal: Risk for activity intolerance will decrease °Outcome: Progressing °  °

## 2022-07-27 NOTE — Anesthesia Preprocedure Evaluation (Addendum)
Anesthesia Evaluation  Patient identified by MRN, date of birth, ID band Patient awake    Reviewed: Allergy & Precautions, NPO status , Patient's Chart, lab work & pertinent test results  Airway Mallampati: III  TM Distance: >3 FB Neck ROM: full    Dental  (+) Chipped, Missing, Loose,    Pulmonary Current Smoker and Patient abstained from smoking.,    Pulmonary exam normal        Cardiovascular hypertension, + CAD and + Cardiac Stents  Normal cardiovascular exam     Neuro/Psych negative neurological ROS  negative psych ROS   GI/Hepatic Neg liver ROS, Partial Large bowel obstruction (HCC) Stricture of rectosigmoid junction on CT  patient nauseated and dryheaving this morning   Endo/Other  diabetes  Renal/GU negative Renal ROS  negative genitourinary   Musculoskeletal   Abdominal distended  Peds  Hematology  (+) Blood dyscrasia, anemia ,   Anesthesia Other Findings Past Medical History: No date: Bradycardia No date: Coronary artery disease No date: Diabetes mellitus without complication (HCC) No date: Hyperlipidemia No date: Hypertension No date: Nephrolithiasis  Past Surgical History: No date: CATARACT EXTRACTION No date: KIDNEY STONE SURGERY Feb 2013: NECK SURGERY No date: nephrolithiasis No date: stent (other)  BMI    Body Mass Index: 24.32 kg/m      Reproductive/Obstetrics negative OB ROS                            Anesthesia Physical Anesthesia Plan  ASA: 3  Anesthesia Plan: General   Post-op Pain Management: Minimal or no pain anticipated   Induction: Intravenous and Rapid sequence  PONV Risk Score and Plan: Treatment may vary due to age or medical condition  Airway Management Planned: Oral ETT  Additional Equipment:   Intra-op Plan:   Post-operative Plan: Extubation in OR  Informed Consent: I have reviewed the patients History and Physical, chart, labs  and discussed the procedure including the risks, benefits and alternatives for the proposed anesthesia with the patient or authorized representative who has indicated his/her understanding and acceptance.     Dental Advisory Given  Plan Discussed with: Anesthesiologist, CRNA and Surgeon  Anesthesia Plan Comments:        Anesthesia Quick Evaluation

## 2022-07-27 NOTE — Consult Note (Signed)
[facility] SURGICAL CONSULTATION NOTE (initial) - cpt: [id number]   HISTORY OF PRESENT ILLNESS (HPI):  76 y.o. male presented to [facility] ED on 09/10 for evaluation of abdominal pain and constipation. Patient reports he has gone about 2 weeks without having normal bowel function. He has very small stools infrequently and has been able to pass some gas here and there. He has tried OTC medications without relief. This has resulted in some generalized abdominal discomfort, distension, and nausea. No fever, chills, cough, SOB, or urinary changes. No previous intra-abdominal surgeries. He is on Plavix secondary to CAD which he last took on Sunday. Laboratory work up in the ED was relatively reassuring aside from a mild leukocytosis at 11.9K and hyperkalemia to 5.2. CT Abdomen/Pelvis was concerning for possible distal large bowel obstruction. Admitted to the medicine service. Underwent flexible sigmoidoscopy with Dr Haig Prophet today (09/11) which was concerning for obstructing malignancy 10 cm from anal verge.   Surgery is consulted by gastroenterology physician Dr. Andrey Farmer, MD in this context for evaluation and management of distal large obstruction.   PAST MEDICAL HISTORY (PMH):  Past Medical History:  Diagnosis Date   Bradycardia    Coronary artery disease    Diabetes mellitus without complication ([place])    Hyperlipidemia    Hypertension    Nephrolithiasis      PAST SURGICAL HISTORY ([place]):  Past Surgical History:  Procedure Laterality Date   CATARACT EXTRACTION     KIDNEY STONE SURGERY     NECK SURGERY  Feb 2013   nephrolithiasis     stent (other)       MEDICATIONS:  Prior to Admission medications   Medication Sig Start Date End Date Taking? Authorizing Provider  aspirin 81 MG EC tablet Take 81 mg by mouth daily.     Yes [provider]  atorvastatin (LIPITOR) 40 MG tablet TAKE 1 TABLET BY MOUTH EVERY DAY 06/22/22  Yes Gollan, [name] November, MD  clopidogrel (PLAVIX)  75 MG tablet TAKE 1 TABLET BY MOUTH EVERY DAY 08/25/21  Yes Gollan, [name] November, MD  cyanocobalamin (VITAMIN B12) 500 MCG tablet Take 500 mcg by mouth daily.   Yes [provider]  famotidine (PEPCID) 40 MG tablet Take 1 tablet (40 mg total) by mouth daily. 08/15/21  Yes [name], MD  glimepiride (AMARYL) 4 MG tablet Take 4 mg by mouth daily with breakfast. 05/15/20 07/26/22 Yes [provider]  isosorbide mononitrate (IMDUR) 30 MG 24 hr tablet TAKE 1 TABLET BY MOUTH EVERY DAY 08/18/21  Yes Gollan, [name] November, MD  metFORMIN (GLUCOPHAGE) 1000 MG tablet Take 1,000 mg by mouth 2 (two) times daily with a meal.  02/06/20 07/26/22 Yes [provider]  pioglitazone (ACTOS) 15 MG tablet Take 15 mg by mouth daily. 05/05/22  Yes [provider]  nitroGLYCERIN (NITROSTAT) 0.4 MG SL tablet Place 1 tablet (0.4 mg total) under the tongue every 5 (five) minutes as needed for chest pain. 12/06/13   [name], MD     ALLERGIES:  No Known Allergies   SOCIAL HISTORY:  Social History   Socioeconomic History   Marital status: Married    Spouse name: Not on file   Number of children: Not on file   Years of education: Not on file   Highest education level: Not on file  Occupational History   Not on file  Tobacco Use   Smoking status: Every Day    Packs/day: 0.50    Years: 36.00  Total pack years: 18.00    Types: Cigarettes   Smokeless tobacco: Never  Vaping Use   Vaping Use: Never used  Substance and Sexual Activity   Alcohol use: No   Drug use: No   Sexual activity: Not on file  Other Topics Concern   Not on file  Social History Narrative   Not on file   Social Determinants of Health   Financial Resource Strain: Not on file  Food Insecurity: No Food Insecurity (07/27/2022)   Hunger Vital Sign    Worried About Running Out of Food in the Last Year: Never true    Ran Out of Food in the Last Year: Never true  Transportation Needs: No Transportation  Needs (07/27/2022)   PRAPARE - Hydrologist (Medical): No    Lack of Transportation (Non-Medical): No  Physical Activity: Not on file  Stress: Not on file  Social Connections: Not on file  Intimate Partner Violence: Not At Risk (07/27/2022)   Humiliation, Afraid, Rape, and Kick questionnaire    Fear of Current or Ex-Partner: No    Emotionally Abused: No    Physically Abused: No    Sexually Abused: No     FAMILY HISTORY:  Family History  Problem Relation Age of Onset   Heart disease Father       REVIEW OF SYSTEMS:  Review of Systems  Constitutional:  Negative for chills and fever.  HENT:  Negative for congestion and sore throat.   Cardiovascular:  Negative for chest pain and palpitations.  Gastrointestinal:  Positive for abdominal pain and constipation. Negative for diarrhea, nausea and vomiting.  Genitourinary:  Negative for dysuria and urgency.  All other systems reviewed and are negative.   VITAL SIGNS:  Temp:  [97.5 F (36.4 C)-98.5 F (36.9 C)] 98 F (36.7 C) (09/11 1346) Pulse Rate:  [79-109] 90 (09/11 1346) Resp:  [16-22] 18 (09/11 1346) BP: (130-193)/(68-99) 161/85 (09/11 1346) SpO2:  [92 %-100 %] 92 % (09/11 1346) Weight:  [81.3 kg] 81.3 kg (09/10 2322)     Height: 6' (182.9 cm) Weight: 81.3 kg BMI (Calculated): 24.31   INTAKE/OUTPUT:  09/10 0701 - 09/11 0700 In: 949.7 [I.V.:899.7; IV Piggyback:50] Out: 175 [Urine:175]  PHYSICAL EXAM:  Physical Exam Vitals and nursing note reviewed. Exam conducted with a chaperone present.  Constitutional:      General: He is not in acute distress.    Appearance: Normal appearance. He is not ill-appearing.     Comments: Patient sitting up in bed, NAD, somewhat somnolent from recent procedure  HENT:     Head: Normocephalic and atraumatic.     Mouth/Throat:     Comments: Poor dentition  Eyes:     General: No scleral icterus.    Conjunctiva/sclera: Conjunctivae normal.  Cardiovascular:      Rate and Rhythm: Normal rate and regular rhythm.     Pulses: Normal pulses.     Heart sounds: No murmur heard. Pulmonary:     Effort: Pulmonary effort is normal. No respiratory distress.  Abdominal:     General: There is distension.     Palpations: Abdomen is soft.     Tenderness: There is no abdominal tenderness. There is no guarding or rebound.     Comments: Abdomen is markedly distended and tympanic. Fortunately, he does not appear overtly tender, no rebound/guarding. He is certainly not peritonitic   Genitourinary:    Comments: Deferred Musculoskeletal:     Right lower leg: No edema.  Left lower leg: No edema.  Skin:    General: Skin is warm and dry.     Coloration: Skin is not pale.     Findings: No erythema.  Neurological:     General: No focal deficit present.     Mental Status: He is alert and oriented to person, place, and time.  Psychiatric:        Mood and Affect: Mood normal.        Behavior: Behavior normal.      Labs:     Latest Ref Rng & Units 07/26/2022    5:56 PM 12/20/2014   12:58 PM 01/01/2012    9:54 AM  CBC  WBC 4.0 - 10.5 K/uL 11.9  9.9  10.0   Hemoglobin 13.0 - 17.0 g/dL 11.5  15.3  15.2   Hematocrit 39.0 - 52.0 % 37.2  46.1  44.9   Platelets 150 - 400 K/uL 311  163  143       Latest Ref Rng & Units 07/26/2022    5:56 PM 10/19/2017   11:39 AM 12/20/2014    2:19 PM  CMP  Glucose 70 - 99 mg/dL 107   290   BUN 8 - 23 mg/dL 24   16   Creatinine 0.61 - 1.24 mg/dL 1.23   1.05   Sodium 135 - 145 mmol/L 137   135   Potassium 3.5 - 5.1 mmol/L 5.2   4.5   Chloride 98 - 111 mmol/L 104   100   CO2 22 - 32 mmol/L 21   19   Calcium 8.9 - 10.3 mg/dL 9.4   8.6   Total Protein 6.5 - 8.1 g/dL 7.3  6.8    Total Bilirubin 0.3 - 1.2 mg/dL 1.4  0.4    Alkaline Phos 38 - 126 U/L 82  83    AST 15 - 41 U/L 36  19    ALT 0 - 44 U/L 19  27       Imaging studies:   CT Abdomen/Pelvis (07/26/2022) personally reviewed which does show distended large bowel with  transition in the distal colon vs proximal rectum concerning for obstruction, and radiologist report reviewed below:  IMPRESSION: Short-segment area of narrowing within the distal sigmoid near the rectosigmoid junction. Cannot exclude partially obstructing stricture or malignancy. Proximal to this area, the colon is dilated with a large amount of gas and stool throughout the colon.   Aortic atherosclerosis.   Assessment/Plan: (ICD-10's: K71.609) 76 y.o. male with progressive abdominal distension and constipation found to have likely malignant large bowel obstruction at the level of the rectum   - Given concern for near obstruction, I do feel that this patient will be best suited for diversion with likely transverse loop colostomy. He is on Plavix (last taken on Sunday). We will attempt to give him some time to clear this and tentatively plan for Wednesday 09/13 with Dr Christian Mate. Fortunately, he is not peritonitic at this time. However, he understands that should he clinically deteriorate at any point in the interim, we will need to proceed more urgently.   - Continue to hold Plavix - May be best for bowel rest + IVF support - Monitor abdominal examination    - No role for NGT at this time in setting of large bowel obstruction. Rectal tube decompression would not be helpful either given near obstructing mass - Further management per primary service; we will follow along    All of the above findings and  recommendations were discussed with the patient, and all of patient's questions were answered to his expressed satisfaction.  Thank you for the opportunity to participate in this patient's care.   -- Edison Simon, PA-C [facility] 07/27/2022, 2:41 PM M-F: 7am - 4pm

## 2022-07-27 NOTE — Anesthesia Procedure Notes (Signed)
Procedure Name: Intubation Date/Time: 07/27/2022 12:24 PM  Performed by: Tollie Eth, CRNAPre-anesthesia Checklist: Patient identified, Patient being monitored, Timeout performed, Emergency Drugs available and Suction available Patient Re-evaluated:Patient Re-evaluated prior to induction Oxygen Delivery Method: Circle system utilized Preoxygenation: Pre-oxygenation with 100% oxygen Induction Type: IV induction, Cricoid Pressure applied and Rapid sequence Laryngoscope Size: Mac and 4 Grade View: Grade I Tube type: Oral Tube size: 7.0 mm Number of attempts: 1 Airway Equipment and Method: Stylet Placement Confirmation: ETT inserted through vocal cords under direct vision, positive ETCO2 and breath sounds checked- equal and bilateral Secured at: 21 cm Tube secured with: Tape Dental Injury: Teeth and Oropharynx as per pre-operative assessment

## 2022-07-28 ENCOUNTER — Encounter
Admission: EM | Disposition: A | Discharge status: Home Health | Payer: Self-pay | Source: Home / Self Care | Attending: Hospitalist

## 2022-07-28 ENCOUNTER — Other Ambulatory Visit: Payer: Self-pay | Admitting: Pathology

## 2022-07-28 ENCOUNTER — Inpatient Hospital Stay: Payer: Medicare HMO | Admitting: Certified Registered Nurse Anesthetist

## 2022-07-28 ENCOUNTER — Encounter: Payer: Self-pay | Admitting: Gastroenterology

## 2022-07-28 DIAGNOSIS — K624 Stenosis of anus and rectum: Secondary | ICD-10-CM | POA: Diagnosis not present

## 2022-07-28 DIAGNOSIS — K56609 Unspecified intestinal obstruction, unspecified as to partial versus complete obstruction: Secondary | ICD-10-CM | POA: Diagnosis not present

## 2022-07-28 HISTORY — PX: TRANSVERSE LOOP COLOSTOMY: SHX6478

## 2022-07-28 LAB — SURGICAL PATHOLOGY

## 2022-07-28 LAB — GLUCOSE, CAPILLARY
Glucose-Capillary: 129 mg/dL — ABNORMAL HIGH (ref 70–99)
Glucose-Capillary: 135 mg/dL — ABNORMAL HIGH (ref 70–99)
Glucose-Capillary: 150 mg/dL — ABNORMAL HIGH (ref 70–99)
Glucose-Capillary: 150 mg/dL — ABNORMAL HIGH (ref 70–99)
Glucose-Capillary: 152 mg/dL — ABNORMAL HIGH (ref 70–99)
Glucose-Capillary: 163 mg/dL — ABNORMAL HIGH (ref 70–99)
Glucose-Capillary: 164 mg/dL — ABNORMAL HIGH (ref 70–99)
Glucose-Capillary: 165 mg/dL — ABNORMAL HIGH (ref 70–99)

## 2022-07-28 SURGERY — CREATION, COLOSTOMY, LOOP, TRANSVERSE COLON
Anesthesia: General

## 2022-07-28 MED ORDER — OXYCODONE HCL 5 MG/5ML PO SOLN: 5.0000 mg | Freq: Once | ORAL | Status: AC | PRN

## 2022-07-28 MED ORDER — SODIUM CHLORIDE 0.9 % IV SOLN
INTRAVENOUS | Status: AC
  Filled 2022-07-28: qty 2

## 2022-07-28 MED ORDER — LORAZEPAM 2 MG/ML IJ SOLN
INTRAMUSCULAR | Status: AC
  Filled 2022-07-28: qty 1

## 2022-07-28 MED ORDER — HYDROCODONE-ACETAMINOPHEN 5-325 MG PO TABS
1.0000 | ORAL_TABLET | ORAL | Status: DC | PRN
  Administered 2022-07-28: 2 via ORAL
  Administered 2022-07-29 (×2): 1 via ORAL
  Administered 2022-07-30: 2 via ORAL
  Administered 2022-07-30 (×2): 1 via ORAL
  Administered 2022-07-31: 2 via ORAL
  Filled 2022-07-28 (×4): qty 1
  Filled 2022-07-28 (×3): qty 2

## 2022-07-28 MED ORDER — KETAMINE HCL 50 MG/5ML IJ SOSY
PREFILLED_SYRINGE | INTRAMUSCULAR | Status: AC
  Filled 2022-07-28: qty 5

## 2022-07-28 MED ORDER — DEXMEDETOMIDINE HCL IN NACL 80 MCG/20ML IV SOLN
INTRAVENOUS | Status: DC | PRN
  Administered 2022-07-28 (×2): 4 ug via BUCCAL

## 2022-07-28 MED ORDER — LIDOCAINE HCL (CARDIAC) PF 100 MG/5ML IV SOSY
PREFILLED_SYRINGE | INTRAVENOUS | Status: DC | PRN
  Administered 2022-07-28: 80 mg via INTRAVENOUS

## 2022-07-28 MED ORDER — FENTANYL CITRATE (PF) 100 MCG/2ML IJ SOLN
INTRAMUSCULAR | Status: AC
  Filled 2022-07-28: qty 2

## 2022-07-28 MED ORDER — CHLORHEXIDINE GLUCONATE 0.12 % MT SOLN
OROMUCOSAL | Status: AC
  Administered 2022-07-28: 15 mL via OROMUCOSAL
  Filled 2022-07-28: qty 15

## 2022-07-28 MED ORDER — ROCURONIUM BROMIDE 100 MG/10ML IV SOLN: INTRAVENOUS | Status: DC | PRN

## 2022-07-28 MED ORDER — ONDANSETRON HCL 4 MG/2ML IJ SOLN
INTRAMUSCULAR | Status: DC | PRN
  Administered 2022-07-28: 4 mg via INTRAVENOUS

## 2022-07-28 MED ORDER — BUPIVACAINE-EPINEPHRINE (PF) 0.25% -1:200000 IJ SOLN
INTRAMUSCULAR | Status: AC
  Filled 2022-07-28: qty 30

## 2022-07-28 MED ORDER — CHLORHEXIDINE GLUCONATE 0.12 % MT SOLN: 15.0000 mL | Freq: Once | OROMUCOSAL | Status: AC

## 2022-07-28 MED ORDER — SODIUM CHLORIDE 0.9 % IV SOLN
2.0000 g | Freq: Once | INTRAVENOUS | Status: AC
  Administered 2022-07-28: 2 g via INTRAVENOUS

## 2022-07-28 MED ORDER — PROPOFOL 10 MG/ML IV BOLUS
INTRAVENOUS | Status: AC
  Filled 2022-07-28: qty 20

## 2022-07-28 MED ORDER — ACETAMINOPHEN 325 MG PO TABS
ORAL_TABLET | ORAL | Status: AC
  Filled 2022-07-28: qty 2

## 2022-07-28 MED ORDER — DIPHENHYDRAMINE HCL 25 MG PO CAPS
ORAL_CAPSULE | ORAL | Status: AC
  Filled 2022-07-28: qty 1

## 2022-07-28 MED ORDER — OXYCODONE HCL 5 MG PO TABS
5.0000 mg | ORAL_TABLET | Freq: Once | ORAL | Status: AC | PRN
  Administered 2022-07-28: 5 mg via ORAL

## 2022-07-28 MED ORDER — PROPOFOL 10 MG/ML IV BOLUS
INTRAVENOUS | Status: DC | PRN
  Administered 2022-07-28: 150 mg via INTRAVENOUS

## 2022-07-28 MED ORDER — FENTANYL CITRATE (PF) 100 MCG/2ML IJ SOLN
25.0000 ug | INTRAMUSCULAR | Status: DC | PRN
  Administered 2022-07-28 (×2): 50 ug via INTRAVENOUS

## 2022-07-28 MED ORDER — FENTANYL CITRATE (PF) 100 MCG/2ML IJ SOLN
INTRAMUSCULAR | Status: DC | PRN
  Administered 2022-07-28: 25 ug via INTRAVENOUS
  Administered 2022-07-28: 50 ug via INTRAVENOUS
  Administered 2022-07-28: 25 ug via INTRAVENOUS
  Administered 2022-07-28 (×2): 50 ug via INTRAVENOUS

## 2022-07-28 MED ORDER — SUGAMMADEX SODIUM 200 MG/2ML IV SOLN
INTRAVENOUS | Status: DC | PRN
  Administered 2022-07-28: 200 mg via INTRAVENOUS

## 2022-07-28 MED ORDER — PHENYLEPHRINE 80 MCG/ML (10ML) SYRINGE FOR IV PUSH (FOR BLOOD PRESSURE SUPPORT)
PREFILLED_SYRINGE | INTRAVENOUS | Status: DC | PRN
  Administered 2022-07-28: 120 ug via INTRAVENOUS
  Administered 2022-07-28 (×6): 80 ug via INTRAVENOUS

## 2022-07-28 MED ORDER — KETAMINE HCL 10 MG/ML IJ SOLN
INTRAMUSCULAR | Status: DC | PRN
  Administered 2022-07-28: 20 mg via INTRAVENOUS

## 2022-07-28 MED ORDER — OXYCODONE HCL 5 MG PO TABS
ORAL_TABLET | ORAL | Status: AC
  Filled 2022-07-28: qty 1

## 2022-07-28 MED ORDER — ROCURONIUM BROMIDE 100 MG/10ML IV SOLN
INTRAVENOUS | Status: DC | PRN
  Administered 2022-07-28: 100 mg via INTRAVENOUS

## 2022-07-28 MED ORDER — ACETAMINOPHEN 10 MG/ML IV SOLN
INTRAVENOUS | Status: AC
  Filled 2022-07-28: qty 100

## 2022-07-28 MED ORDER — DEXAMETHASONE SODIUM PHOSPHATE 10 MG/ML IJ SOLN
INTRAMUSCULAR | Status: DC | PRN
  Administered 2022-07-28: 5 mg via INTRAVENOUS

## 2022-07-28 MED ORDER — BUPIVACAINE-EPINEPHRINE 0.25% -1:200000 IJ SOLN
INTRAMUSCULAR | Status: DC | PRN
  Administered 2022-07-28: 30 mL

## 2022-07-28 MED ORDER — ACETAMINOPHEN 10 MG/ML IV SOLN
INTRAVENOUS | Status: DC | PRN
  Administered 2022-07-28: 1000 mg via INTRAVENOUS

## 2022-07-28 SURGICAL SUPPLY — 42 items
BLADE SURG 15 STRL LF DISP TIS (BLADE) ×1 IMPLANT
BLADE SURG 15 STRL SS (BLADE) ×1
CATH ROBINSON RED A/P 20FR (CATHETERS) IMPLANT
CHLORAPREP W/TINT 26 (MISCELLANEOUS) ×1 IMPLANT
DRAIN PENROSE 12X.25 LTX STRL (MISCELLANEOUS) ×1 IMPLANT
DRAPE INCISE IOBAN 66X45 STRL (DRAPES) IMPLANT
DRAPE LAPAROTOMY 100X77 ABD (DRAPES) ×1 IMPLANT
ELECT BLADE 6.5 EXT (BLADE) ×1 IMPLANT
ELECT CAUTERY BLADE 6.4 (BLADE) ×1 IMPLANT
ELECT REM PT RETURN 9FT ADLT (ELECTROSURGICAL) ×1
ELECTRODE REM PT RTRN 9FT ADLT (ELECTROSURGICAL) ×1 IMPLANT
GAUZE 4X4 16PLY ~~LOC~~+RFID DBL (SPONGE) ×1 IMPLANT
GLOVE ORTHO TXT STRL SZ7.5 (GLOVE) ×2 IMPLANT
GOWN STRL REUS W/ TWL LRG LVL3 (GOWN DISPOSABLE) ×1 IMPLANT
GOWN STRL REUS W/ TWL XL LVL3 (GOWN DISPOSABLE) ×1 IMPLANT
GOWN STRL REUS W/TWL LRG LVL3 (GOWN DISPOSABLE) ×2
GOWN STRL REUS W/TWL XL LVL3 (GOWN DISPOSABLE) ×2
HANDLE YANKAUER SUCT BULB TIP (MISCELLANEOUS) IMPLANT
KIT OSTOMY 2 PC DRNBL 2.25 STR (WOUND CARE) IMPLANT
KIT OSTOMY DRAINABLE 2.25 STR (WOUND CARE)
KIT OSTOMY DRAINABLE 2.75 STR (WOUND CARE) IMPLANT
KIT TURNOVER KIT A (KITS) ×1 IMPLANT
LABEL OR SOLS (LABEL) ×1 IMPLANT
LIGASURE IMPACT 36 18CM CVD LR (INSTRUMENTS) IMPLANT
LOOP OSTOMY BRIDGE (OSTOMY) IMPLANT
MANIFOLD NEPTUNE II (INSTRUMENTS) ×1 IMPLANT
NEEDLE HYPO 22GX1.5 SAFETY (NEEDLE) ×1 IMPLANT
NS IRRIG 1000ML POUR BTL (IV SOLUTION) ×1 IMPLANT
PACK BASIN MAJOR ARMC (MISCELLANEOUS) ×1 IMPLANT
SHEARS HARMONIC STRL 23CM (MISCELLANEOUS) IMPLANT
SPONGE T-LAP 18X18 ~~LOC~~+RFID (SPONGE) ×3 IMPLANT
STAPLER SKIN PROX 35W (STAPLE) IMPLANT
SUT PDS AB 0 CT1 27 (SUTURE) IMPLANT
SUT SILK 2 0 (SUTURE) ×1
SUT SILK 2-0 18XBRD TIE 12 (SUTURE) ×1 IMPLANT
SUT SILK 3-0 (SUTURE) ×1 IMPLANT
SUT VIC AB 3-0 SH 27 (SUTURE)
SUT VIC AB 3-0 SH 27X BRD (SUTURE) IMPLANT
SYR 10ML LL (SYRINGE) ×1 IMPLANT
TRAP FLUID SMOKE EVACUATOR (MISCELLANEOUS) ×1 IMPLANT
TRAY FOLEY MTR SLVR 16FR STAT (SET/KITS/TRAYS/PACK) IMPLANT
WATER STERILE IRR 500ML POUR (IV SOLUTION) ×1 IMPLANT

## 2022-07-28 NOTE — H&P (View-Only) (Signed)
[place] [facility] Day(s): 2.   Interval History: Patient seen and examined, no acute events or new complaints overnight. Patient reports he is feeling "miserable" this morning. Still remains distended, nauseous, and having small episodes of emesis. He is still able to pass small amounts per rectum. No new labs or imaging studies this morning.   Vital signs in last 24 hours: [min-max] current  Temp:  [97.5 F (36.4 C)-98.5 F (36.9 C)] 98.4 F (36.9 C) (09/12 0313) Pulse Rate:  [86-109] 86 (09/12 0313) Resp:  [16-22] 18 (09/12 0313) BP: (127-193)/(60-99) 149/60 (09/12 0313) SpO2:  [92 %-100 %] 94 % (09/12 0313)     Height: 6' (182.9 cm) Weight: 81.3 kg BMI (Calculated): 24.31   Intake/Output last 2 shifts:  09/11 0701 - 09/12 0700 In: 500 [I.V.:500] Out: 600 [Urine:500; Emesis/NG output:100]   Physical Exam:  Constitutional: alert, resting in bed, appears uncomfortable  HENT: normocephalic without obvious abnormality  Eyes: PERRL, EOM's grossly intact and symmetric  Respiratory: breathing non-labored at rest  Cardiovascular: regular rate and sinus rhythm  Gastrointestinal: Abdomen is markedly distended, non-tender, no rebound/guarding Musculoskeletal: no edema or wounds, motor and sensation grossly intact, NT    Labs:     Latest Ref Rng & Units 07/26/2022    5:56 PM 12/20/2014   12:58 PM 01/01/2012    9:54 AM  CBC  WBC 4.0 - 10.5 K/uL 11.9  9.9  10.0   Hemoglobin 13.0 - 17.0 g/dL 11.5  15.3  15.2   Hematocrit 39.0 - 52.0 % 37.2  46.1  44.9   Platelets 150 - 400 K/uL 311  163  143       Latest Ref Rng & Units 07/26/2022    5:56 PM 10/19/2017   11:39 AM 12/20/2014    2:19 PM  CMP  Glucose 70 - 99 mg/dL 107   290   BUN 8 - 23 mg/dL 24   16   Creatinine 0.61 - 1.24 mg/dL 1.23   1.05   Sodium 135 - 145 mmol/L 137   135   Potassium 3.5 - 5.1 mmol/L 5.2   4.5   Chloride 98 - 111 mmol/L 104   100   CO2 22 - 32 mmol/L 21   19    Calcium 8.9 - 10.3 mg/dL 9.4   8.6   Total Protein 6.5 - 8.1 g/dL 7.3  6.8    Total Bilirubin 0.3 - 1.2 mg/dL 1.4  0.4    Alkaline Phos 38 - 126 U/L 82  83    AST 15 - 41 U/L 36  19    ALT 0 - 44 U/L 19  27      Imaging studies: No new pertinent imaging studies   Assessment/Plan: (ICD-10's: K36.609) 76 y.o. male with progressive abdominal distension and constipation found to have likely malignant large bowel obstruction at the level of the rectum              - Unfortunately, he continues to be quite miserable and abdomen is markedly distended. I do not think he will tolerate waiting another 24 hours. Instead, we will proceed with diverting transverse loop colostomy this afternoon with Dr Christian Mate.   - All risks, benefits, and alternatives to above procedure(s) were discussed with the patient, all of his questions were answered to his expressed satisfaction, patient expresses he wishes to proceed, and informed consent was obtained.             -  Continue to hold Plavix - May be best for bowel rest + IVF support - Monitor abdominal examination              - No role for NGT at this time in setting of large bowel obstruction. Rectal tube decompression would not be helpful either given near obstructing mass - Further management per primary service; we will follow along    All of the above findings and recommendations were discussed with the patient, and the medical team, and all of patient's questions were answered to his expressed satisfaction.  -- Edison Simon, PA-C [facility] 07/28/2022, 7:38 AM M-F: 7am - 4pm

## 2022-07-28 NOTE — Interval H&P Note (Signed)
History and Physical Interval Note:  07/28/2022 12:07 PM  [name] [name]  has presented today for surgery, with the diagnosis of Obstructing colon mass.  The various methods of treatment have been discussed with the patient and family. After consideration of risks, benefits and other options for treatment, the patient has consented to  Procedure(s): TRANSVERSE LOOP COLOSTOMY (N/A) as a surgical intervention.  The patient's history has been reviewed, patient examined, no change in status, stable for surgery.  I have reviewed the patient's chart and labs.  Questions were answered to the patient's satisfaction.     Ronny Bacon

## 2022-07-28 NOTE — Transfer of Care (Signed)
Immediate Anesthesia Transfer of Care Note  Patient: Renaldo Gornick Cua  Procedure(s) Performed: TRANSVERSE LOOP COLOSTOMY  Patient Location: PACU  Anesthesia Type:General  Level of Consciousness: drowsy  Airway & Oxygen Therapy: Patient Spontanous Breathing and Patient connected to face mask oxygen  Post-op Assessment: Report given to RN and Post -op Vital signs reviewed and stable  Post vital signs: Reviewed and stable  Last Vitals:  Vitals Value Taken Time  BP 159/77 07/28/22 1349  Temp    Pulse 74 07/28/22 1351  Resp 15 07/28/22 1351  SpO2 99 % 07/28/22 1351  Vitals shown include unvalidated device data.  Last Pain:  Vitals:   07/28/22 1121  TempSrc: Oral  PainSc: 7          Complications: No notable events documented.

## 2022-07-28 NOTE — Progress Notes (Signed)
Notified patient family in waiting room that patient is doing great, and just went back to his room.

## 2022-07-28 NOTE — Op Note (Signed)
Transverse loop colostomy creation.  Pre-operative Diagnosis: Large bowel obstruction secondary to rectal adenocarcinoma.  Post-operative Diagnosis: same.    Surgeon: Ronny Bacon, M.D., FACS  Anesthesia: Endotracheal  Findings: As expected distended transverse colon.  Estimated Blood Loss: 15 mL         Specimens: None          Complications: none              Procedure Details  The patient was seen again in the Holding Room. The benefits, complications, treatment options, and expected outcomes were discussed with the patient. The risks of bleeding, infection, recurrence of symptoms, failure to resolve symptoms, unanticipated injury, prosthetic placement, prosthetic infection, any of which could require further surgery were reviewed with the patient. The likelihood of improving the patient's symptoms with return to their baseline status is expected.  The patient and/or family concurred with the proposed plan, giving informed consent.  The patient was taken to Operating Room, identified and the procedure verified.    Prior to the induction of general anesthesia, antibiotic prophylaxis was administered. VTE prophylaxis was in place.  General anesthesia was then administered and tolerated well. After the induction, the patient was positioned in the supine position and the abdomen was prepped with  Chloraprep and draped in the sterile fashion.  A Time Out was held and the above information confirmed.  A transverse incision was made in the patient's left upper outer quadrant.  Dissection continued down to the anterior rectus sheath.  This was then incised transversely, muscle-splitting exposes the posterior sheath, posterior sheath opened in transverse fashion, with care entering the peritoneal cavity. The omentum was dissected away, and tinea on the transverse colon were identified.  I applied [place] to the transverse colon and dissected away the tethering omentum and any adjacent  adhesions to enable a knuckle of transverse colon to be brought exteriorly.  A small tear was inadvertently created, but closed with minimal soilage with 3-0 Vicryl sutures. Once complete luminal exteriorization obtained I then created a window within the mesentery and placed a red Quentin Cornwall for retraction.  Further dissection was completed to enable it to be without tension.  I then transitioned the red Quentin Cornwall to the plastic stomal bridge.  We then proceeded with maturing the stoma by opening the colon transversely and everting the mucosa with interrupted 3-0 Vicryl sutures circumferentially.  I confirmed adequate patency of both the efferent and efferent loop without any evidence of twist or distortion. After confirming adequate hemostasis we then applied the stoma appliance maintaining the bridge within the appliance.  I believe the bridge is secure within the stoma appliance, not securing it otherwise. This completed the procedure.  It was well-tolerated.      Ronny Bacon M.D., [facility] 07/28/2022 2:09 PM

## 2022-07-28 NOTE — Progress Notes (Addendum)
[facility] [place] NAME: [name] [name]    MR#:  [id number]  DATE OF BIRTH:  01-01-1946  SUBJECTIVE:  patient came in with few weeks of progressive worsening dry heaves nausea and difficulty with having bowel movement. Evaluation the ER showed narrowing/stricture around director sigmoid area. Patient continues to have some dry heaves earlier. No vomiting. Patient feels very distended. Passing some gas. No family at bedside   VITALS:  Blood pressure (!) 154/71, pulse 73, temperature 97.7 F (36.5 C), resp. rate 16, height 6' (1.829 m), weight 81.3 kg, SpO2 93 %.  PHYSICAL EXAMINATION:   GENERAL:  76 y.o.-year-old patient lying in the bed with no acute distress. weak LUNGS: Normal breath sounds bilaterally, no wheezing, rales, rhonchi.  CARDIOVASCULAR: S1, S2 normal. No murmurs, rubs, or gallops.  ABDOMEN: Soft,tight and distended. Bowel sounds present. FOLEY+ EXTREMITIES: No  edema b/l.    NEUROLOGIC: nonfocal  patient is alert and awake week, debilitated SKIN: No obvious rash, lesion, or ulcer.   LABORATORY PANEL:  CBC Recent Labs  Lab 07/26/22 1756  WBC 11.9*  HGB 11.5*  HCT 37.2*  PLT 311     Chemistries  Recent Labs  Lab 07/26/22 1756  NA 137  K 5.2*  CL 104  CO2 21*  GLUCOSE 107*  BUN 24*  CREATININE 1.23  CALCIUM 9.4  AST 36  ALT 19  ALKPHOS 82  BILITOT 1.4*    Cardiac Enzymes No results for input(s): "TROPONINI" in the last 168 hours. RADIOLOGY:  CT ABDOMEN PELVIS W CONTRAST  Result Date: 07/26/2022 CLINICAL DATA:  Abdominal pain, acute, nonlocalized No stool for 2 weeks EXAM: CT ABDOMEN AND PELVIS WITH CONTRAST TECHNIQUE: Multidetector CT imaging of the abdomen and pelvis was performed using the standard protocol following bolus administration of intravenous contrast. RADIATION DOSE REDUCTION: This exam was performed according to the departmental dose-optimization program which includes automated exposure  control, adjustment of the mA and/or kV according to patient size and/or use of iterative reconstruction technique. CONTRAST:  81m OMNIPAQUE IOHEXOL 350 MG/ML SOLN COMPARISON:  09/14/2008 FINDINGS: Lower chest: No acute abnormality Hepatobiliary: No focal hepatic abnormality. Gallbladder unremarkable. Pancreas: No focal abnormality or ductal dilatation. Spleen: No focal abnormality.  Normal size. Adrenals/Urinary Tract: Bilateral renal parapelvic cysts. No follow-up imaging recommended. Scarring in the upper pole of the right kidney. No hydronephrosis. Adrenal glands and urinary bladder unremarkable. Stomach/Bowel: Large stool burden and gaseous distention of the colon to the distal sigmoid colon/rectosigmoid junction. There is caliber change at this level. Cannot exclude colonic stricture or malignancy in this area. Normal appendix. Stomach and small bowel decompressed, unremarkable. Vascular/Lymphatic: Aortic atherosclerosis. No evidence of aneurysm or adenopathy. Reproductive: No visible focal abnormality. Other: No free fluid or free air. Musculoskeletal: No acute bony abnormality. IMPRESSION: Short-segment area of narrowing within the distal sigmoid near the rectosigmoid junction. Cannot exclude partially obstructing stricture or malignancy. Proximal to this area, the colon is dilated with a large amount of gas and stool throughout the colon. Aortic atherosclerosis. Electronically Signed   By: KRolm BaptiseM.D.   On: 07/26/2022 18:57    Assessment and Plan [name] Hulbertis a 76y.o. male with medical history significant for DM, CAD, HTN who presents to the ED with a 2-week history of constipation unresolved with multiple over-the-counter medications associated with colicky abdominal pain  CT abdomen and pelvis showing the following findings: Short-segment area of narrowing within the distal sigmoid near the rectosigmoid  junction. Cannot exclude partially obstructing stricture or malignancy.  Proximal to this area, the colon is dilated with a large amount of gas and stool throughout the colon.  Partial Large bowel obstruction (HCC) Stricture of rectosigmoid junction on CT abdomen/pelvis -- patient underwent flex sigmoidoscopy which shows getting mass of the rectum. -- Dr. Haig Prophet has contacted Dr. Christian Mate from general surgery to see patient -- continue IV fluids --Oncology consult--Dr Grayland Ormond informed. --pt scheduled for Diverting colostomy today   Diabetes mellitus without complication (HCC) Sliding scale insulin coverage.   --Hold home oral hypoglycemics of glimepiride and metformin   Coronary artery disease --Patient denies chest pain --Will hold aspirin, atorvastatin and Imdur --Nitroglycerin sublingual as needed chest pain   Essential hypertension --Hydralazine IV prn while NPO   Procedures: flex sigmoidoscopy Family communication :unable to leave message for wife Consults : G.I., general surgery, Oncology CODE STATUS: full DVT Prophylaxis :enoxaparin Level of care: Med-Surg Status is: Inpatient Remains inpatient appropriate because: large bowel obstruction secondary to rectal mass. Pt is scheduled for surgery today    TOTAL TIME TAKING CARE OF THIS PATIENT: 35 minutes.  >50% time spent on counselling and coordination of care  Note: This dictation was prepared with Dragon dictation along with smaller phrase technology. Any transcriptional errors that result from this process are unintentional.  Fritzi Mandes M.D    [facility]   CC: Primary care physician; Gay Filler, DO

## 2022-07-28 NOTE — Progress Notes (Signed)
[place] Hospital Day(s): 2.   Interval History: Patient seen and examined, no acute events or new complaints overnight. Patient reports he is feeling "miserable" this morning. Still remains distended, nauseous, and having small episodes of emesis. He is still able to pass small amounts per rectum. No new labs or imaging studies this morning.   Vital signs in last 24 hours: [min-max] current  Temp:  [97.5 F (36.4 C)-98.5 F (36.9 C)] 98.4 F (36.9 C) (09/12 0313) Pulse Rate:  [86-109] 86 (09/12 0313) Resp:  [16-22] 18 (09/12 0313) BP: (127-193)/(60-99) 149/60 (09/12 0313) SpO2:  [92 %-100 %] 94 % (09/12 0313)     Height: 6' (182.9 cm) Weight: 81.3 kg BMI (Calculated): 24.31   Intake/Output last 2 shifts:  09/11 0701 - 09/12 0700 In: 500 [I.V.:500] Out: 600 [Urine:500; Emesis/NG output:100]   Physical Exam:  Constitutional: alert, resting in bed, appears uncomfortable  HENT: normocephalic without obvious abnormality  Eyes: PERRL, EOM's grossly intact and symmetric  Respiratory: breathing non-labored at rest  Cardiovascular: regular rate and sinus rhythm  Gastrointestinal: Abdomen is markedly distended, non-tender, no rebound/guarding Musculoskeletal: no edema or wounds, motor and sensation grossly intact, NT    Labs:     Latest Ref Rng & Units 07/26/2022    5:56 PM 12/20/2014   12:58 PM 01/01/2012    9:54 AM  CBC  WBC 4.0 - 10.5 K/uL 11.9  9.9  10.0   Hemoglobin 13.0 - 17.0 g/dL 11.5  15.3  15.2   Hematocrit 39.0 - 52.0 % 37.2  46.1  44.9   Platelets 150 - 400 K/uL 311  163  143       Latest Ref Rng & Units 07/26/2022    5:56 PM 10/19/2017   11:39 AM 12/20/2014    2:19 PM  CMP  Glucose 70 - 99 mg/dL 107   290   BUN 8 - 23 mg/dL 24   16   Creatinine 0.61 - 1.24 mg/dL 1.23   1.05   Sodium 135 - 145 mmol/L 137   135   Potassium 3.5 - 5.1 mmol/L 5.2   4.5   Chloride 98 - 111 mmol/L 104   100   CO2 22 - 32 mmol/L 21   19    Calcium 8.9 - 10.3 mg/dL 9.4   8.6   Total Protein 6.5 - 8.1 g/dL 7.3  6.8    Total Bilirubin 0.3 - 1.2 mg/dL 1.4  0.4    Alkaline Phos 38 - 126 U/L 82  83    AST 15 - 41 U/L 36  19    ALT 0 - 44 U/L 19  27      Imaging studies: No new pertinent imaging studies   Assessment/Plan: (ICD-10's: K37.609) 76 y.o. male with progressive abdominal distension and constipation found to have likely malignant large bowel obstruction at the level of the rectum              - Unfortunately, he continues to be quite miserable and abdomen is markedly distended. I do not think he will tolerate waiting another 24 hours. Instead, we will proceed with diverting transverse loop colostomy this afternoon with Dr Christian Mate.   - All risks, benefits, and alternatives to above procedure(s) were discussed with the patient, all of his questions were answered to his expressed satisfaction, patient expresses he wishes to proceed, and informed consent was obtained.             -  Continue to hold Plavix - May be best for bowel rest + IVF support - Monitor abdominal examination              - No role for NGT at this time in setting of large bowel obstruction. Rectal tube decompression would not be helpful either given near obstructing mass - Further management per primary service; we will follow along    All of the above findings and recommendations were discussed with the patient, and the medical team, and all of patient's questions were answered to his expressed satisfaction.  -- Edison Simon, PA-C [facility] 07/28/2022, 7:38 AM M-F: 7am - 4pm

## 2022-07-28 NOTE — Anesthesia Procedure Notes (Signed)
Procedure Name: Intubation Date/Time: 07/28/2022 12:27 PM  Performed by: Lily Peer, Jennetta Flood, CRNAPre-anesthesia Checklist: Patient identified, Emergency Drugs available, Suction available and Patient being monitored Patient Re-evaluated:Patient Re-evaluated prior to induction Oxygen Delivery Method: Circle system utilized Preoxygenation: Pre-oxygenation with 100% oxygen Induction Type: IV induction and Rapid sequence Laryngoscope Size: McGraph and 4 Grade View: Grade I Tube type: Oral Tube size: 7.0 mm Number of attempts: 1 Airway Equipment and Method: Stylet Placement Confirmation: ETT inserted through vocal cords under direct vision, positive ETCO2 and breath sounds checked- equal and bilateral Secured at: 21 cm Tube secured with: Tape Dental Injury: Teeth and Oropharynx as per pre-operative assessment

## 2022-07-28 NOTE — Anesthesia Preprocedure Evaluation (Signed)
Anesthesia Evaluation  Patient identified by MRN, date of birth, ID band Patient awake    Reviewed: Allergy & Precautions, NPO status , Patient's Chart, lab work & pertinent test results  History of Anesthesia Complications Negative for: history of anesthetic complications  Airway Mallampati: III  TM Distance: >3 FB Neck ROM: full    Dental  (+) Chipped, Missing, Loose,    Pulmonary Current Smoker and Patient abstained from smoking.,    Pulmonary exam normal        Cardiovascular hypertension, + CAD and + Cardiac Stents  Normal cardiovascular exam     Neuro/Psych negative neurological ROS  negative psych ROS   GI/Hepatic Neg liver ROS, Partial Large bowel obstruction (HCC) Stricture of rectosigmoid junction on CT  patient nauseated and dryheaving this morning   Endo/Other  diabetes  Renal/GU Renal disease  negative genitourinary   Musculoskeletal   Abdominal distended  Peds  Hematology  (+) Blood dyscrasia, anemia ,   Anesthesia Other Findings Past Medical History: No date: Bradycardia No date: Coronary artery disease No date: Diabetes mellitus without complication (HCC) No date: Hyperlipidemia No date: Hypertension No date: Nephrolithiasis  Past Surgical History: No date: CATARACT EXTRACTION No date: KIDNEY STONE SURGERY Feb 2013: NECK SURGERY No date: nephrolithiasis No date: stent (other)  BMI    Body Mass Index: 24.32 kg/m      Reproductive/Obstetrics negative OB ROS                             Anesthesia Physical  Anesthesia Plan  ASA: 3  Anesthesia Plan: General   Post-op Pain Management: Toradol IV (intra-op)* and Ofirmev IV (intra-op)*   Induction: Intravenous and Rapid sequence  PONV Risk Score and Plan: 2 and Treatment may vary due to age or medical condition, Ondansetron and Dexamethasone  Airway Management Planned: Oral ETT  Additional Equipment:    Intra-op Plan:   Post-operative Plan: Extubation in OR  Informed Consent: I have reviewed the patients History and Physical, chart, labs and discussed the procedure including the risks, benefits and alternatives for the proposed anesthesia with the patient or authorized representative who has indicated his/her understanding and acceptance.     Dental Advisory Given  Plan Discussed with: Anesthesiologist, CRNA and Surgeon  Anesthesia Plan Comments: (Patient consented for risks of anesthesia including but not limited to:  - adverse reactions to medications - damage to eyes, teeth, lips or other oral mucosa - nerve damage due to positioning  - sore throat or hoarseness - Damage to heart, brain, nerves, lungs, other parts of body or loss of life  Patient voiced understanding.)        Anesthesia Quick Evaluation

## 2022-07-29 ENCOUNTER — Encounter: Payer: Self-pay | Admitting: Surgery

## 2022-07-29 DIAGNOSIS — K56609 Unspecified intestinal obstruction, unspecified as to partial versus complete obstruction: Secondary | ICD-10-CM | POA: Diagnosis not present

## 2022-07-29 LAB — GLUCOSE, CAPILLARY
Glucose-Capillary: 104 mg/dL — ABNORMAL HIGH (ref 70–99)
Glucose-Capillary: 115 mg/dL — ABNORMAL HIGH (ref 70–99)
Glucose-Capillary: 209 mg/dL — ABNORMAL HIGH (ref 70–99)
Glucose-Capillary: 150 mg/dL — ABNORMAL HIGH (ref 70–99)
Glucose-Capillary: 146 mg/dL — ABNORMAL HIGH (ref 70–99)

## 2022-07-29 MED ORDER — ASPIRIN 81 MG PO TBEC
81.0000 mg | DELAYED_RELEASE_TABLET | Freq: Every day | ORAL | Status: DC
  Administered 2022-07-30: 81 mg via ORAL
  Filled 2022-07-29: qty 1

## 2022-07-29 MED ORDER — ISOSORBIDE MONONITRATE ER 30 MG PO TB24
30.0000 mg | ORAL_TABLET | Freq: Every day | ORAL | Status: DC
  Administered 2022-07-29 – 2022-07-30 (×2): 30 mg via ORAL
  Filled 2022-07-29 (×2): qty 1

## 2022-07-29 MED ORDER — FAMOTIDINE 20 MG PO TABS
40.0000 mg | ORAL_TABLET | Freq: Every day | ORAL | Status: DC
  Administered 2022-07-29 – 2022-07-30 (×2): 40 mg via ORAL
  Filled 2022-07-29 (×2): qty 2

## 2022-07-29 NOTE — Consult Note (Addendum)
[place] Nurse ostomy consult note Stoma type/location: LMQ loop colostomy with plastic retention rod in place.  4" post operative pouch leaked last evening and bedside RN place a 2 3/4" pouch which has a good seal at this time.  Producing moderate stool, staff has assisted with emptying several times.  Stomal assessment/size: 2" edematous, retention rod in place.  Peristomal assessment: pouch intact not assessed Treatment options for stomal/peristomal skin:  Will need barrier ring, 2 3/4" pouch Output  soft brown stool Ostomy pouching: 2pc. 2 3/4" pouch with barrier ring  Education provided: Spoke with patient regarding ostomy.  He is accepting but anxious to have it reversed. We discuss emptying when 1/3 full, twice weekly pouches and that pouching may be different once rod is removed.   Reports some pain today.  Is hungry . Lives with wife and adult son at home.  States his wife will be helping him at home and would like her to be here for pouch change. She will be here Thursday mid morning.  I will pass this along to my team.  Enrolled patient in [place] program: No  after pouch change to assess needs.  [place] team will follow.  Domenic Moras MSN, RN, FNP-BC CWON Wound, Ostomy, Continence Nurse Pager [phone]

## 2022-07-29 NOTE — Progress Notes (Signed)
PT Cancellation Note  Patient Details Name: [name] [name] MRN: [id number] DOB: 03/15/1946   Cancelled Treatment:    Reason Eval/Treat Not Completed: PT screened, no needs identified, will sign off. Consult received and chart reviewed. Pt currently with no PT needs identified and relayed to staff to continue mobility efforts as pt indep at this time. If further needs arise, please re-consult.   Saranda Legrande 07/29/2022, 2:08 PM Greggory Stallion, PT, DPT, GCS [phone]

## 2022-07-29 NOTE — Care Management Important Message (Signed)
Important Message  Patient Details  Name: [name] [name] MRN: [id number] Date of Birth: 1945-12-28   Medicare Important Message Given:  Yes     Aishi Courts, Leroy Sea 07/29/2022, 1:33 PM

## 2022-07-29 NOTE — Progress Notes (Signed)
[place] Hospital Day(s): 3.   Post op day(s): 1 Day Post-Op.   Interval History:  Patient seen and examined No acute events or new complaints overnight.  Patient reports he feels much better Abdominal is distended but improved comparatively No nausea, emesis, fever, chills No new labs this morning Ostomy with 400 ccs out; stool and gas He is on CLD; tolerating well but wants something a little more substantial   Vital signs in last 24 hours: [min-max] current  Temp:  [97.7 F (36.5 C)-98.6 F (37 C)] 98.1 F (36.7 C) (09/13 0334) Pulse Rate:  [69-84] 69 (09/13 0334) Resp:  [13-20] 20 (09/13 0334) BP: (131-178)/(62-86) 131/62 (09/13 0334) SpO2:  [92 %-97 %] 95 % (09/13 0334)     Height: 6' (182.9 cm) Weight: 81.3 kg BMI (Calculated): 24.31   Intake/Output last 2 shifts:  09/12 0701 - 09/13 0700 In: 1450 [I.V.:1300; IV Piggyback:150] Out: 405 [Stool:400; Blood:5]   Physical Exam:  Constitutional: alert, cooperative and no distress  Respiratory: breathing non-labored at rest  Cardiovascular: regular rate and sinus rhythm  Gastrointestinal: Soft, sore, he remains distended but improved, no rebound/guarding. Loop colostomy in the upper abdomen; gas and stool in bag  Labs:     Latest Ref Rng & Units 07/26/2022    5:56 PM 12/20/2014   12:58 PM 01/01/2012    9:54 AM  CBC  WBC 4.0 - 10.5 K/uL 11.9  9.9  10.0   Hemoglobin 13.0 - 17.0 g/dL 11.5  15.3  15.2   Hematocrit 39.0 - 52.0 % 37.2  46.1  44.9   Platelets 150 - 400 K/uL 311  163  143       Latest Ref Rng & Units 07/26/2022    5:56 PM 10/19/2017   11:39 AM 12/20/2014    2:19 PM  CMP  Glucose 70 - 99 mg/dL 107   290   BUN 8 - 23 mg/dL 24   16   Creatinine 0.61 - 1.24 mg/dL 1.23   1.05   Sodium 135 - 145 mmol/L 137   135   Potassium 3.5 - 5.1 mmol/L 5.2   4.5   Chloride 98 - 111 mmol/L 104   100   CO2 22 - 32 mmol/L 21   19   Calcium 8.9 - 10.3 mg/dL 9.4   8.6   Total  Protein 6.5 - 8.1 g/dL 7.3  6.8    Total Bilirubin 0.3 - 1.2 mg/dL 1.4  0.4    Alkaline Phos 38 - 126 U/L 82  83    AST 15 - 41 U/L 36  19    ALT 0 - 44 U/L 19  27      Imaging studies: No new pertinent imaging studies   Assessment/Plan:  76 y.o. male 1 Day Post-Op s/p diverting transverse loop colostomy for obstructing distal rectal mass, pathology shows invasive colorectal adenocarcinoma    - Will trial on FLD this morning. Will hold on advancement until distension improving  - Wean from IVF as diet advances  - Monitor abdominal examination; on-going bowel function - Pain control prn; antiemetics prn - Engage WOC RN for teaching  - Oncology consult pending; +adenocarcinoma  - Mobilization as tolerated; engage PT  - Further management per primary service  All of the above findings and recommendations were discussed with the patient, and the medical team, and all of patient's questions were answered to his expressed satisfaction.  -- Edison Simon, PA-C [facility] 07/29/2022, 7:24 AM M-F: 7am - 4pm

## 2022-07-29 NOTE — Progress Notes (Signed)
  PROGRESS NOTE    Felix Meras Veith  FKC:[id number] DOB: 02-22-1946 DOA: 07/26/2022 PCP: Gay Filler, DO  214A/214A-AA  LOS: 3 days   Brief hospital course: No notes on file  Assessment & Plan: Randi College is a 76 y.o. male with medical history significant for DM, CAD, HTN who presents to the ED with a 2-week history of constipation unresolved with multiple over-the-counter medications associated with colicky abdominal pain  Partial Large bowel obstruction (HCC) S/p Diverting colostomy --obstruction from Distal rectal mass, invasive colorectal adenocarcinoma  --cont Full liquid diet --d/c MIVF --consult to ostomy RN  Distal rectal mass Invasive colorectal adenocarcinoma  --oncology consult, with Dr. Grayland Ormond   Diabetes mellitus without complication ([place]) --Hold home oral hypoglycemics of glimepiride and metformin --SSI   Coronary artery disease --resume ASA (plavix still on hold) --resume statin after discharge   Essential hypertension --resume home Imdur    DVT prophylaxis: Lovenox SQ Code Status: Full code  Family Communication:  Level of care: Med-Surg Dispo:   The patient is from: home Anticipated d/c is to: home Anticipated d/c date is: 1-2 days   Subjective and Interval History:  Pt reported soreness around his abdominal surgical site.  Tolerating Full liquid diet, and having stool output.  No dyspnea.   Objective: Vitals:   07/28/22 2002 07/29/22 0334 07/29/22 0747 07/29/22 1526  BP: (!) 145/66 131/62 135/77 (!) 144/67  Pulse: 70 69 69 84  Resp: '20 20 18 18  '$ Temp: 98.1 F (36.7 C) 98.1 F (36.7 C) 98.2 F (36.8 C)   TempSrc: Oral  Oral   SpO2: 94% 95% 98% 98%  Weight:      Height:        Intake/Output Summary (Last 24 hours) at 07/29/2022 1716 Last data filed at 07/29/2022 1647 Gross per 24 hour  Intake 2262.58 ml  Output 800 ml  Net 1462.58 ml   Filed Weights   07/26/22 2322  Weight: 81.3 kg    Examination:    Constitutional: NAD, AAOx3 HEENT: conjunctivae and lids normal, EOMI CV: No cyanosis.   RESP: normal respiratory effort, on RA GI: mushy brown stool in ostomy bag Neuro: II - XII grossly intact.   Psych: Normal mood and affect.  Appropriate judgement and reason   Data Reviewed: I have personally reviewed labs and imaging studies  Time spent: 35 minutes  Enzo Bi, MD [facility] If 7PM-7AM, please contact night-coverage 07/29/2022, 5:16 PM

## 2022-07-29 NOTE — TOC Progression Note (Signed)
Transition of Care [facility]) - Progression Note    Patient Details  Name: [name] [name] MRN: [id number] Date of Birth: 1946/03/08  Transition of Care [facility]) CM/SW Contact  Beverly Sessions, RN Phone Number: 07/29/2022, 3:12 PM  Clinical Narrative:     Noted patient to have new POD1 ostomy.  TOC to follow up with patient and discuss home health        Expected Discharge Plan and Services                                                 Social Determinants of Health (SDOH) Interventions    Readmission Risk Interventions     No data to display

## 2022-07-29 NOTE — Consult Note (Signed)
[place]  Telephone:(336) [phone] Fax:(336) [phone]  ID: Terell Kincy OB: 11/18/1945  MR#: [id number]  RKY#:[id number]  Patient Care Team: Gay Filler, DO as PCP - General (Pediatrics) [name], MD as PCP - Cardiology (Cardiology) [name], MD as Consulting Physician (Cardiology) [name], RN as Oncology Nurse Navigator  CHIEF COMPLAINT: Rectal adenocarcinoma, unclear stage.  INTERVAL HISTORY: Patient is a 76 year old male who recently presented to the emergency room with a 2-week history of constipation and abdominal pain.  Subsequent work-up revealed an obstructing rectal mass.  Subsequent biopsy confirmed malignancy and patient underwent diverting colostomy yesterday.  Patient feels improved, but about back to his baseline.  He has no neurologic complaints.  He denies any recent fevers or illnesses.  He has a poor appetite and admits to a 10 to 20 pound weight loss recently.  He has no chest pain, shortness of breath, cough, or hemoptysis.  He denies any nausea, vomiting, constipation, or diarrhea.  He does not report any melena or hematochezia.  He has no urinary complaints.  Patient offers no further specific complaints today.  REVIEW OF SYSTEMS:   Review of Systems  Constitutional:  Positive for malaise/fatigue and weight loss. Negative for fever.  Respiratory: Negative.  Negative for cough, hemoptysis and shortness of breath.   Cardiovascular: Negative.  Negative for chest pain and leg swelling.  Gastrointestinal:  Positive for abdominal pain. Negative for blood in stool and melena.  Genitourinary: Negative.  Negative for dysuria.  Musculoskeletal: Negative.  Negative for back pain.  Skin: Negative.  Negative for rash.  Neurological:  Positive for weakness. Negative for dizziness, focal weakness and headaches.  Psychiatric/Behavioral: Negative.  The patient is not nervous/anxious.     As per HPI. Otherwise, a complete  review of systems is negative.  PAST MEDICAL HISTORY: Past Medical History:  Diagnosis Date   Bradycardia    Coronary artery disease    Diabetes mellitus without complication ([place])    Hyperlipidemia    Hypertension    Nephrolithiasis     PAST SURGICAL HISTORY: Past Surgical History:  Procedure Laterality Date   CATARACT EXTRACTION     FLEXIBLE SIGMOIDOSCOPY N/A 07/27/2022   Procedure: FLEXIBLE SIGMOIDOSCOPY;  Surgeon: Lesly Rubenstein, MD;  Location: [facility] ENDOSCOPY;  Service: Endoscopy;  Laterality: N/A;   KIDNEY STONE SURGERY     NECK SURGERY  Feb 2013   nephrolithiasis     stent (other)     TRANSVERSE LOOP COLOSTOMY N/A 07/28/2022   Procedure: TRANSVERSE LOOP COLOSTOMY;  Surgeon: Ronny Bacon, MD;  Location: [facility] ORS;  Service: General;  Laterality: N/A;    FAMILY HISTORY: Family History  Problem Relation Age of Onset   Heart disease Father     ADVANCED DIRECTIVES (Y/N):  '@ADVDIR'$ @  HEALTH MAINTENANCE: Social History   Tobacco Use   Smoking status: Every Day    Packs/day: 0.50    Years: 36.00    Total pack years: 18.00    Types: Cigarettes   Smokeless tobacco: Never  Vaping Use   Vaping Use: Never used  Substance Use Topics   Alcohol use: No   Drug use: No     Colonoscopy:  PAP:  Bone density:  Lipid panel:  No Known Allergies  Current Facility-Administered Medications  Medication Dose Route Frequency Provider Last Rate Last Admin   acetaminophen (TYLENOL) tablet 650 mg  650 mg Oral Q6H PRN Ronny Bacon, MD       Or   acetaminophen (  TYLENOL) suppository 650 mg  650 mg Rectal Q6H PRN Ronny Bacon, MD       enoxaparin (LOVENOX) injection 40 mg  40 mg Subcutaneous Q24H Ronny Bacon, MD   40 mg at 07/29/22 0847   hydrALAZINE (APRESOLINE) injection 10 mg  10 mg Intravenous Q6H PRN Ronny Bacon, MD   10 mg at 07/27/22 1647   HYDROcodone-acetaminophen (NORCO/VICODIN) 5-325 MG per tablet 1-2 tablet  1-2 tablet Oral Q4H PRN Ronny Bacon, MD   1 tablet at 07/29/22 1517   insulin aspart (novoLOG) injection 0-15 Units  0-15 Units Subcutaneous Q4H Ronny Bacon, MD   5 Units at 07/29/22 1155   lactated ringers infusion   Intravenous Continuous Tylene Fantasia, PA-C 50 mL/hr at 07/29/22 0839 Rate Change at 07/29/22 0839   morphine (PF) 2 MG/ML injection 2 mg  2 mg Intravenous Q2H PRN Ronny Bacon, MD   2 mg at 07/26/22 2307   ondansetron (ZOFRAN) tablet 4 mg  4 mg Oral Q6H PRN Ronny Bacon, MD   4 mg at 07/27/22 1232   Or   ondansetron (ZOFRAN) injection 4 mg  4 mg Intravenous Q4H PRN Ronny Bacon, MD   4 mg at 07/28/22 7026   Oral care mouth rinse  15 mL Mouth Rinse PRN Ronny Bacon, MD       promethazine (PHENERGAN) 12.5 mg in sodium chloride 0.9 % 50 mL IVPB  12.5 mg Intravenous Q6H PRN Ronny Bacon, MD   Stopped at 07/28/22 0325    OBJECTIVE: Vitals:   07/29/22 0747 07/29/22 1526  BP: 135/77 (!) 144/67  Pulse: 69 84  Resp: 18 18  Temp: 98.2 F (36.8 C)   SpO2: 98% 98%     Body mass index is 24.32 kg/m.    ECOG FS:1 - Symptomatic but completely ambulatory  General: Well-developed, well-nourished, no acute distress. Eyes: Pink conjunctiva, anicteric sclera. HEENT: Normocephalic, moist mucous membranes. Lungs: No audible wheezing or coughing. Heart: Regular rate and rhythm. Abdomen: Soft, nontender, no obvious distention.  Colostomy bag noted. Musculoskeletal: No edema, cyanosis, or clubbing. Neuro: Alert, answering all questions appropriately. Cranial nerves grossly intact. Skin: No rashes or petechiae noted. Psych: Normal affect. Lymphatics: No cervical, calvicular, axillary or inguinal LAD.   LAB RESULTS:  Lab Results  Component Value Date   NA 137 07/26/2022   K 5.2 (H) 07/26/2022   CL 104 07/26/2022   CO2 21 (L) 07/26/2022   GLUCOSE 107 (H) 07/26/2022   BUN 24 (H) 07/26/2022   CREATININE 1.23 07/26/2022   CALCIUM 9.4 07/26/2022   PROT 7.3 07/26/2022   ALBUMIN 4.1  07/26/2022   AST 36 07/26/2022   ALT 19 07/26/2022   ALKPHOS 82 07/26/2022   BILITOT 1.4 (H) 07/26/2022   GFRNONAA >60 07/26/2022   GFRAA >60 12/20/2014    Lab Results  Component Value Date   WBC 11.9 (H) 07/26/2022   NEUTROABS 8.5 (H) 07/26/2022   HGB 11.5 (L) 07/26/2022   HCT 37.2 (L) 07/26/2022   MCV 85.1 07/26/2022   PLT 311 07/26/2022     STUDIES: CT ABDOMEN PELVIS W CONTRAST  Result Date: 07/26/2022 CLINICAL DATA:  Abdominal pain, acute, nonlocalized No stool for 2 weeks EXAM: CT ABDOMEN AND PELVIS WITH CONTRAST TECHNIQUE: Multidetector CT imaging of the abdomen and pelvis was performed using the standard protocol following bolus administration of intravenous contrast. RADIATION DOSE REDUCTION: This exam was performed according to the departmental dose-optimization program which includes automated exposure control, adjustment of the mA and/or  kV according to patient size and/or use of iterative reconstruction technique. CONTRAST:  91m OMNIPAQUE IOHEXOL 350 MG/ML SOLN COMPARISON:  09/14/2008 FINDINGS: Lower chest: No acute abnormality Hepatobiliary: No focal hepatic abnormality. Gallbladder unremarkable. Pancreas: No focal abnormality or ductal dilatation. Spleen: No focal abnormality.  Normal size. Adrenals/Urinary Tract: Bilateral renal parapelvic cysts. No follow-up imaging recommended. Scarring in the upper pole of the right kidney. No hydronephrosis. Adrenal glands and urinary bladder unremarkable. Stomach/Bowel: Large stool burden and gaseous distention of the colon to the distal sigmoid colon/rectosigmoid junction. There is caliber change at this level. Cannot exclude colonic stricture or malignancy in this area. Normal appendix. Stomach and small bowel decompressed, unremarkable. Vascular/Lymphatic: Aortic atherosclerosis. No evidence of aneurysm or adenopathy. Reproductive: No visible focal abnormality. Other: No free fluid or free air. Musculoskeletal: No acute bony  abnormality. IMPRESSION: Short-segment area of narrowing within the distal sigmoid near the rectosigmoid junction. Cannot exclude partially obstructing stricture or malignancy. Proximal to this area, the colon is dilated with a large amount of gas and stool throughout the colon. Aortic atherosclerosis. Electronically Signed   By: KRolm BaptiseM.D.   On: 07/26/2022 18:57    ASSESSMENT: Rectal adenocarcinoma, unclear stage.  PLAN:    Rectal adenocarcinoma, unclear stage: Confirmed by imaging and biopsy.  Patient has now undergone diverting colostomy to prevent obstruction.  He will need a pelvic MRI to complete the staging work-up.  CT of the abdomen and pelvis does not indicate any metastatic disease.  Okay to discharge from an oncology standpoint.  Will complete staging work-up as an outpatient. Abdominal pain: Likely secondary to underlying malignancy.  Improved with diverting colostomy. Anemia: Mild, monitor.  Appreciate consult, will follow.   TLloyd Huger MD   07/29/2022 4:29 PM

## 2022-07-29 NOTE — Progress Notes (Signed)
Mobility Specialist - Progress Note   07/29/22 1400  Mobility  Activity Ambulated independently in hallway;Stood at bedside;Dangled on edge of bed  Level of Assistance Independent  Assistive Device None  Distance Ambulated (ft) 240 ft  Activity Response Tolerated well  $Mobility charge 1 Mobility   Pt sitting upright in bed on 2L upon arrival. Pt STS and has 1 LOB corrected on his own. Pt ambulates in hallway indep. Pt returns to bed with needs in reach.  Gretchen Short  Mobility Specialist  07/29/22 2:02 PM

## 2022-07-29 NOTE — Anesthesia Postprocedure Evaluation (Signed)
Anesthesia Post Note  Patient: [name] [name]  Procedure(s) Performed: TRANSVERSE LOOP COLOSTOMY  Patient location during evaluation: PACU Anesthesia Type: General Level of consciousness: awake and alert Pain management: pain level controlled Vital Signs Assessment: post-procedure vital signs reviewed and stable Respiratory status: spontaneous breathing, nonlabored ventilation, respiratory function stable and patient connected to nasal cannula oxygen Cardiovascular status: blood pressure returned to baseline and stable Postop Assessment: no apparent nausea or vomiting Anesthetic complications: no   No notable events documented.   Last Vitals:  Vitals:   07/29/22 0334 07/29/22 0747  BP: 131/62 135/77  Pulse: 69 69  Resp: 20 18  Temp: 36.7 C 36.8 C  SpO2: 95% 98%    Last Pain:  Vitals:   07/29/22 0747  TempSrc: Oral  PainSc:                  Ilene Qua

## 2022-07-30 DIAGNOSIS — K56609 Unspecified intestinal obstruction, unspecified as to partial versus complete obstruction: Secondary | ICD-10-CM | POA: Diagnosis not present

## 2022-07-30 LAB — CBC
HCT: 25.4 % — ABNORMAL LOW (ref 39.0–52.0)
Hemoglobin: 7.9 g/dL — ABNORMAL LOW (ref 13.0–17.0)
MCH: 25.6 pg — ABNORMAL LOW (ref 26.0–34.0)
MCHC: 31.1 g/dL (ref 30.0–36.0)
MCV: 82.2 fL (ref 80.0–100.0)
Platelets: 164 10*3/uL (ref 150–400)
RBC: 3.09 MIL/uL — ABNORMAL LOW (ref 4.22–5.81)
RDW: 15.4 % (ref 11.5–15.5)
WBC: 8.1 10*3/uL (ref 4.0–10.5)
nRBC: 0 % (ref 0.0–0.2)

## 2022-07-30 LAB — BASIC METABOLIC PANEL
Anion gap: 5 (ref 5–15)
BUN: 16 mg/dL (ref 8–23)
CO2: 28 mmol/L (ref 22–32)
Calcium: 7.4 mg/dL — ABNORMAL LOW (ref 8.9–10.3)
Chloride: 103 mmol/L (ref 98–111)
Creatinine, Ser: 0.85 mg/dL (ref 0.61–1.24)
GFR, Estimated: >60 mL/min (ref >60)
Glucose, Bld: 112 mg/dL — ABNORMAL HIGH (ref 70–99)
Potassium: 3.5 mmol/L (ref 3.5–5.1)
Sodium: 136 mmol/L (ref 135–145)

## 2022-07-30 LAB — GLUCOSE, CAPILLARY
Glucose-Capillary: 112 mg/dL — ABNORMAL HIGH (ref 70–99)
Glucose-Capillary: 109 mg/dL — ABNORMAL HIGH (ref 70–99)
Glucose-Capillary: 108 mg/dL — ABNORMAL HIGH (ref 70–99)
Glucose-Capillary: 144 mg/dL — ABNORMAL HIGH (ref 70–99)
Glucose-Capillary: 162 mg/dL — ABNORMAL HIGH (ref 70–99)
Glucose-Capillary: 195 mg/dL — ABNORMAL HIGH (ref 70–99)
Glucose-Capillary: 138 mg/dL — ABNORMAL HIGH (ref 70–99)

## 2022-07-30 LAB — MAGNESIUM: Magnesium: 1.8 mg/dL (ref 1.7–2.4)

## 2022-07-30 MED ORDER — CLOPIDOGREL BISULFATE 75 MG PO TABS
75.0000 mg | ORAL_TABLET | Freq: Every day | ORAL | Status: DC
  Administered 2022-07-31: 75 mg via ORAL
  Filled 2022-07-30: qty 1

## 2022-07-30 NOTE — Progress Notes (Signed)
Mobility Specialist - Progress Note   07/30/22 1537  Mobility  Activity Ambulated independently in hallway  Level of Assistance Independent  Distance Ambulated (ft) 240 ft  Activity Response Tolerated well  $Mobility charge 1 Mobility   Pt OOB on RA upon arrival. Pt STS and ambulates 1 lap around NS indep. Pt returns to bed with needs in reach.   Gretchen Short  Mobility Specialist  07/30/22 3:37 PM

## 2022-07-30 NOTE — Plan of Care (Signed)

## 2022-07-30 NOTE — Progress Notes (Signed)
  PROGRESS NOTE    [name] [name]  ULA:[id number] DOB: 1946-05-24 DOA: 07/26/2022 PCP: Gay Filler, DO  214A/214A-AA  LOS: 4 days   Brief hospital course: No notes on file  Assessment & Plan: [name] [name] is a 76 y.o. male with medical history significant for DM, CAD, HTN who presents to the ED with a 2-week history of constipation unresolved with multiple over-the-counter medications associated with colicky abdominal pain  Partial Large bowel obstruction (HCC) S/p Diverting colostomy --obstruction from Distal rectal mass, invasive colorectal adenocarcinoma  --advanced to solid food today --cont ostomy teaching with pt and family  Distal rectal mass Invasive colorectal adenocarcinoma  --oncology consult, with Dr. Grayland Ormond --outpatient f/u with Dr. Grayland Ormond to complete cancer workup   Diabetes mellitus without complication ([place]) --Hold home oral hypoglycemics of glimepiride and metformin --SSI   Coronary artery disease --cont ASA --resume plavix --resume statin after discharge   Essential hypertension --cont home Imdur    DVT prophylaxis: Lovenox SQ Code Status: Full code  Family Communication: wife updated at bedside today Level of care: Med-Surg Dispo:   The patient is from: home Anticipated d/c is to: home Anticipated d/c date is: likely tomorrow   Subjective and Interval History:  Pt reported having stool output from his colostomy.  Abdomen felt less distended.   Objective: Vitals:   07/30/22 0341 07/30/22 0736 07/30/22 1552 07/30/22 2046  BP: (!) 109/58 125/61 (!) 158/80 (!) 147/64  Pulse: (!) 58 (!) 58 69 80  Resp: '20 18 18 20  '$ Temp: 98 F (36.7 C) 98.4 F (36.9 C) 99.6 F (37.6 C) 98.5 F (36.9 C)  TempSrc: Oral Oral Oral Oral  SpO2: 98% 96% 94% 93%  Weight:      Height:        Intake/Output Summary (Last 24 hours) at 07/30/2022 2051 Last data filed at 07/30/2022 1433 Gross per 24 hour  Intake 240 ml  Output 1470 ml  Net -1230  ml   Filed Weights   07/26/22 2322  Weight: 81.3 kg    Examination:   Constitutional: NAD, AAOx3 HEENT: conjunctivae and lids normal, EOMI CV: No cyanosis.   RESP: normal respiratory effort, on RA Ab: ostomy bag present Neuro: II - XII grossly intact.   Psych: Normal mood and affect.  Appropriate judgement and reason   Data Reviewed: I have personally reviewed labs and imaging studies  Time spent: 35 minutes  Enzo Bi, MD [facility] If 7PM-7AM, please contact night-coverage 07/30/2022, 8:51 PM

## 2022-07-30 NOTE — Consult Note (Signed)
[place] Nurse ostomy consult note Stoma type/location: LLQ loop colostomy with retention rod in place.  First pouch change.  We had arranged for wife to be here but she is not present. Patient calls wife and she was about to get in the shower and could not make it.  Patient agrees to proceed without her.  Minette Brine, RN OCA has been in today and reviewed burping bag due to flatus, emptying when 1/3 full and open and roll closure.  Stomal assessment/size: 2 1/4" with rod. Edematous and moist Peristomal assessment: intact  retention rod makes pouching more challenging.  Demonstrate sliding the rod up and down to fit into the pouch.  Treatment options for stomal/peristomal skin: 2 piece 2 3/4" pouch.  Output soft brown stool Ostomy pouching: 2pc.  Education provided:  We perform pouch change together.  Patient able to assemble pouch pieces, roll open and closed.  He observes me cut the barrier to fit and practices sliding the rod up and down to fit into pouch.  WE apply pouch.  He agrees to review written material at bedside.   Enrolled patient in [place] program: Yes/ will today Will follow.  Domenic Moras MSN, RN, FNP-BC CWON Wound, Ostomy, Continence Nurse Pager [phone]

## 2022-07-30 NOTE — TOC Initial Note (Signed)
Transition of Care [facility]) - Initial/Assessment Note    Patient Details  Name: [name] [name] MRN: [id number] Date of Birth: 03-Jul-1946  [facility] of [facility]) CM/SW Contact:    Beverly Sessions, RN Phone Number: 07/30/2022, 4:08 PM  Clinical Narrative:                    Admitted for:SBO, POD 2 ostomy  Admitted from: home with wife PCP: No PCP.  Follows up at cardiologist and endocrinologist   Current home health/prior home health/DME: Patient states that he has DME that belonged to his father, but that he has not had a need for it  Wife and son at bedside Discussed home health RN for ostomy care Patient in agreement and states that he does not have a preference of agency.  Referral made and accepted by [facility] with Alvis Lemmings.   Patient would like to be established with a new PCP, however [facility] has a wait at this time. Patient in agreement for new patient appointment at [facility] secretary to make appointment.  Wife to call [facility] to see about patient transferring at a later time. Surgery to sign home health orders.  Tommi Rumps with [facility] aware       Patient Goals and CMS Choice        Expected Discharge Plan and Services                                                Prior Living Arrangements/Services                       Activities of Daily Living Home Assistive Devices/Equipment: None ADL Screening (condition at time of admission) Patient's cognitive ability adequate to safely complete daily activities?: Yes Is the patient deaf or have difficulty hearing?: No Does the patient have difficulty seeing, even when wearing glasses/contacts?: No Does the patient have difficulty concentrating, remembering, or making decisions?: Yes Patient able to express need for assistance with ADLs?: Yes Does the patient have difficulty dressing or bathing?: No Independently performs ADLs?: Yes (appropriate for developmental age) Does  the patient have difficulty walking or climbing stairs?: Yes Weakness of Legs: None Weakness of Arms/Hands: None  Permission Sought/Granted                  Emotional Assessment              Admission diagnosis:  Stricture of rectum [K62.4] Patient Active Problem List   Diagnosis Date Noted   Stricture of rectosigmoid junction on CT. 07/26/2022   Diabetes mellitus without complication ([place])    Partial Large bowel obstruction ([place])    Back pain 12/04/2014   Hyperlipidemia 04/04/2010   TOBACCO ABUSE 04/04/2010   Essential hypertension 04/04/2010   Coronary artery disease 04/04/2010   PCP:  Gay Filler, DO Pharmacy:   [facility] [address] [place] [address] Phone: [phone] Fax: [phone]  [id number] [place] [place] [id number]: [phone]: [phone]    Social Determinants of Health (SDOH) Interventions    Readmission Risk Interventions     No data to display

## 2022-07-30 NOTE — Progress Notes (Signed)
Mobility Specialist - Progress Note   07/30/22 0910  Mobility  Activity Ambulated independently in hallway;Stood at bedside;Dangled on edge of bed  Level of Assistance Independent  Distance Ambulated (ft) 240 ft  Activity Response Tolerated well  $Mobility charge 1 Mobility   Pt sitting upright in bed on 2L upon arrival. Pt STS and ambulates in hallway indep, no LOB. Improved pace from previous session. Pt returns to bed with needs in reach.   Gretchen Short  Mobility Specialist  07/30/22 9:11 AM

## 2022-07-30 NOTE — Progress Notes (Signed)
[place] Hospital Day(s): 4.   Post op day(s): 2 Days Post-Op.   Interval History:  Patient seen and examined No acute events or new complaints overnight.  Patient reports he feels much better Abdomin is less distended continuing to improve comparatively No nausea, emesis, fever, chills Hgb down this morning, suspect more dilutional than bleeding.  Ostomy with 975 mLscs out; liquid stool and gas FLD; tolerating well and wants something a more substantial   Vital signs in last 24 hours: [min-max] current  Temp:  [98 F (36.7 C)-98.4 F (36.9 C)] 98.4 F (36.9 C) (09/14 0736) Pulse Rate:  [58-84] 58 (09/14 0736) Resp:  [18-20] 18 (09/14 0736) BP: (109-144)/(58-77) 125/61 (09/14 0736) SpO2:  [96 %-99 %] 96 % (09/14 0736)     Height: 6' (182.9 cm) Weight: 81.3 kg BMI (Calculated): 24.31   Intake/Output last 2 shifts:  09/13 0701 - 09/14 0700 In: 2352.6 [P.O.:600; I.V.:1752.6] Out: 1550 [Urine:575; Stool:975]   Physical Exam:  Constitutional: alert, cooperative and no distress  Respiratory: breathing non-labored at rest  Cardiovascular: regular rate and sinus rhythm  Gastrointestinal: Soft, sore, he remains distended but improved, no rebound/guarding. Loop colostomy in the upper abdomen; gas and stool in bag  Labs:     Latest Ref Rng & Units 07/30/2022    4:28 AM 07/26/2022    5:56 PM 12/20/2014   12:58 PM  CBC  WBC 4.0 - 10.5 K/uL 8.1  11.9  9.9   Hemoglobin 13.0 - 17.0 g/dL 7.9  11.5  15.3   Hematocrit 39.0 - 52.0 % 25.4  37.2  46.1   Platelets 150 - 400 K/uL 164  311  163       Latest Ref Rng & Units 07/30/2022    4:28 AM 07/26/2022    5:56 PM 10/19/2017   11:39 AM  CMP  Glucose 70 - 99 mg/dL 112  107    BUN 8 - 23 mg/dL 16  24    Creatinine 0.61 - 1.24 mg/dL 0.85  1.23    Sodium 135 - 145 mmol/L 136  137    Potassium 3.5 - 5.1 mmol/L 3.5  5.2    Chloride 98 - 111 mmol/L 103  104    CO2 22 - 32 mmol/L 28  21    Calcium  8.9 - 10.3 mg/dL 7.4  9.4    Total Protein 6.5 - 8.1 g/dL  7.3  6.8   Total Bilirubin 0.3 - 1.2 mg/dL  1.4  0.4   Alkaline Phos 38 - 126 U/L  82  83   AST 15 - 41 U/L  36  19   ALT 0 - 44 U/L  19  27     Imaging studies: No new pertinent imaging studies   Assessment/Plan:  76 y.o. male 2 Days Post-Op s/p diverting transverse loop colostomy for obstructing distal rectal mass, pathology shows invasive colorectal adenocarcinoma    - ADAT today. As distension improving  - Monitor abdominal examination; on-going bowel function - Pain control prn; antiemetics prn - Engage WOC RN for teaching  -Monitor H/H, hold plavix one more day.  - Oncology consult pending; [place] conference today.   - Mobilization as tolerated; engage PT  - Further management per primary service  All of the above findings and recommendations were discussed with the patient, and the medical team, and all of patient's questions were answered to his expressed satisfaction.  Ronny Bacon, M.D., FACS [facility]  07/30/2022 ; 8:54 AM

## 2022-07-31 ENCOUNTER — Telehealth: Payer: Self-pay | Admitting: Oncology

## 2022-07-31 DIAGNOSIS — K56609 Unspecified intestinal obstruction, unspecified as to partial versus complete obstruction: Secondary | ICD-10-CM | POA: Diagnosis not present

## 2022-07-31 LAB — CBC
HCT: 26.3 % — ABNORMAL LOW (ref 39.0–52.0)
Hemoglobin: 8.2 g/dL — ABNORMAL LOW (ref 13.0–17.0)
MCH: 25.8 pg — ABNORMAL LOW (ref 26.0–34.0)
MCHC: 31.2 g/dL (ref 30.0–36.0)
MCV: 82.7 fL (ref 80.0–100.0)
Platelets: 185 10*3/uL (ref 150–400)
RBC: 3.18 MIL/uL — ABNORMAL LOW (ref 4.22–5.81)
RDW: 15.1 % (ref 11.5–15.5)
WBC: 8.5 10*3/uL (ref 4.0–10.5)
nRBC: 0 % (ref 0.0–0.2)

## 2022-07-31 LAB — BASIC METABOLIC PANEL
Anion gap: 8 (ref 5–15)
BUN: 14 mg/dL (ref 8–23)
CO2: 29 mmol/L (ref 22–32)
Calcium: 7.7 mg/dL — ABNORMAL LOW (ref 8.9–10.3)
Chloride: 104 mmol/L (ref 98–111)
Creatinine, Ser: 0.79 mg/dL (ref 0.61–1.24)
GFR, Estimated: >60 mL/min (ref >60)
Glucose, Bld: 97 mg/dL (ref 70–99)
Potassium: 3.5 mmol/L (ref 3.5–5.1)
Sodium: 141 mmol/L (ref 135–145)

## 2022-07-31 LAB — GLUCOSE, CAPILLARY
Glucose-Capillary: 97 mg/dL (ref 70–99)
Glucose-Capillary: 124 mg/dL — ABNORMAL HIGH (ref 70–99)
Glucose-Capillary: 182 mg/dL — ABNORMAL HIGH (ref 70–99)

## 2022-07-31 LAB — MAGNESIUM: Magnesium: 2 mg/dL (ref 1.7–2.4)

## 2022-07-31 MED ORDER — HYDROCODONE-ACETAMINOPHEN 5-325 MG PO TABS: 1.0000 | ORAL_TABLET | Freq: Four times a day (QID) | ORAL | 0 refills | Status: DC | PRN

## 2022-07-31 NOTE — Progress Notes (Signed)
[place] Hospital Day(s): 5.   Post op day(s): 3 Days Post-Op.   Interval History:  Patient seen and examined No acute events or new complaints overnight.  Patient reports he is doing better; abdominal symptoms improving Abdominal is sore expectedly No nausea, emesis, fever, chills Labs this morning are reassuring Ostomy with 200 ccs out; stool and gas He is on carb modified diet; tolerating  Vital signs in last 24 hours: [min-max] current  Temp:  [98.4 F (36.9 C)-99.6 F (37.6 C)] 98.4 F (36.9 C) (09/15 0541) Pulse Rate:  [58-80] 68 (09/15 0541) Resp:  [18-20] 20 (09/15 0541) BP: (125-161)/(61-80) 161/76 (09/15 0541) SpO2:  [93 %-96 %] 95 % (09/15 0541)     Height: 6' (182.9 cm) Weight: 81.3 kg BMI (Calculated): 24.31   Intake/Output last 2 shifts:  09/14 0701 - 09/15 0700 In: 360 [P.O.:360] Out: 620 [Urine:420; Stool:200]   Physical Exam:  Constitutional: alert, cooperative and no distress  Respiratory: breathing non-labored at rest  Cardiovascular: regular rate and sinus rhythm  Gastrointestinal: Soft, sore, distension improved. Loop colostomy in the upper abdomen; gas and stool in bag  Labs:     Latest Ref Rng & Units 07/31/2022    4:26 AM 07/30/2022    4:28 AM 07/26/2022    5:56 PM  CBC  WBC 4.0 - 10.5 K/uL 8.5  8.1  11.9   Hemoglobin 13.0 - 17.0 g/dL 8.2  7.9  11.5   Hematocrit 39.0 - 52.0 % 26.3  25.4  37.2   Platelets 150 - 400 K/uL 185  164  311       Latest Ref Rng & Units 07/31/2022    4:26 AM 07/30/2022    4:28 AM 07/26/2022    5:56 PM  CMP  Glucose 70 - 99 mg/dL 97  112  107   BUN 8 - 23 mg/dL '14  16  24   '$ Creatinine 0.61 - 1.24 mg/dL 0.79  0.85  1.23   Sodium 135 - 145 mmol/L 141  136  137   Potassium 3.5 - 5.1 mmol/L 3.5  3.5  5.2   Chloride 98 - 111 mmol/L 104  103  104   CO2 22 - 32 mmol/L '29  28  21   '$ Calcium 8.9 - 10.3 mg/dL 7.7  7.4  9.4   Total Protein 6.5 - 8.1 g/dL   7.3   Total Bilirubin  0.3 - 1.2 mg/dL   1.4   Alkaline Phos 38 - 126 U/L   82   AST 15 - 41 U/L   36   ALT 0 - 44 U/L   19     Imaging studies: No new pertinent imaging studies   Assessment/Plan:  76 y.o. male 3 Days Post-Op s/p diverting transverse loop colostomy for obstructing distal rectal mass, pathology shows invasive colorectal adenocarcinoma    - Okay to continue regular diet as tolerated  - Monitor abdominal examination; on-going bowel function - Pain control prn; antiemetics prn - WOC RN onboard for colostomy for teaching; Home health ordered  - Oncology onboard; complete staging outpatient   - Mobilization as tolerated; doing well  - Further management per primary service   - Discharge Planning: He is stable for discharge from surgical perspective. I will place follow up for a few weeks from now; may benefit from referral to ostomy clinic as well, which I will do.   All of the above findings and recommendations were discussed with  the patient, and the medical team, and all of patient's questions were answered to his expressed satisfaction.  -- Edison Simon, PA-C [facility] 07/31/2022, 7:28 AM M-F: 7am - 4pm

## 2022-07-31 NOTE — Progress Notes (Signed)
Mobility Specialist - Progress Note   07/31/22 0857  Mobility  Activity Ambulated independently in hallway;Stood at bedside;Ambulated independently to bathroom;Dangled on edge of bed  Level of Assistance Independent  Distance Ambulated (ft) 200 ft  Activity Response Tolerated well  $Mobility charge 1 Mobility   Pt sitting upright in bed on RA upon arrival. Pt STS and ambulates to restroom and in hallway indep. Pt returns to bed with needs in reach.   Gretchen Short  Mobility Specialist  07/31/22 8:58 AM

## 2022-07-31 NOTE — Telephone Encounter (Signed)
Patient is discharging form the Hospital today and needs a follow up. Please advise when to schedule. Thank you  He can be called to schedule.

## 2022-07-31 NOTE — Progress Notes (Signed)
Mobility Specialist - Progress Note   07/31/22 1058  Mobility  Activity Ambulated independently in room;Ambulated with assistance in hallway  Level of Assistance Independent  Distance Ambulated (ft) 40 ft  Activity Response Tolerated well  $Mobility charge 1 Mobility   Pt OOB walking into hallway on RA upon arrival. Pt ambulates back to room indep. Pt returns to bed with needs in reach.   Gretchen Short  Mobility Specialist  07/31/22 10:59 AM

## 2022-07-31 NOTE — TOC Transition Note (Addendum)
Transition of Care [facility]) - CM/SW Discharge Note   Patient Details  Name: Tymeer Vaquera MRN: [id number] Date of Birth: [birth year]  [facility]) CM/SW Contact:  Magnus Ivan, LCSW Phone Number: 07/31/2022, 8:54 AM   Clinical Narrative:    Patient has orders to DC home today. Asked PA for [facility] orders. Notified Cory with [facility] [place].  Ostomy Clinical Referral form completed.    Final next level of care: [place] Barriers to Discharge: Barriers Resolved   Patient Goals and CMS Choice        Discharge Placement                       Discharge Plan and Services                          HH Arranged: RN [facility] Agency: [place] Date [facility] Agency Contacted: 07/31/22   Representative spoke with at [place]: [place] Determinants of Health ([place]) Interventions     Readmission Risk Interventions     No data to display

## 2022-07-31 NOTE — Consult Note (Addendum)
[place] Nurse ostomy follow up Pt and wife at bedside for pouch change demonstration and teaching session.  Pt has a plastic rod in place which must be repostioned back and forth over the pouching system to maintain a seal. Current pouch has leaked behind the barrier. Stoma and red and viable, above skin level, 1 3/4 inches and edematous.  Pt was able to apply barrier ring and 2 piece pouching system, clip it together, and open and close velcro to empty. Mod amt liquid brown stool in the pouch; 50cc.   Peristomal assessment: intact skin surrounding. Ostomy pouching: 2 piece pouching system, added barrier ring to attempt to maintain a seal.  Education provided: wife watched the process and asked appropriate questions. 5 sets of supplies provided for use after discharge; barrier ring, Kellie Simmering # [phone], wafer Kellie Simmering # 2, pouch [place] # [phone] Enrolled patient in [employer] Discharge program: Yes, previously Reviewed pouching routines and ordering supplies. Pt and wife deny further questions.  Thank-you,  Julien Girt MSN, [place], [place], [place], [place]

## 2022-07-31 NOTE — Discharge Summary (Signed)
Physician Discharge Summary   [name] [name]  male DOB: [birth year]  KXF:[id number]  PCP: Gay Filler, DO  Admit date: 07/26/2022 Discharge date: 07/31/2022  Admitted From: home Disposition:  home CODE STATUS: Full code  Discharge Instructions     No wound care   Complete by: As directed       Hospital Course:  For full details, please see H&P, progress notes, consult notes and ancillary notes.  Briefly,  [name] [name] is a 76 y.o. male with medical history significant for DM, CAD, HTN who presented to the ED with a 2-week history of constipation unresolved with multiple over-the-counter medications associated with colicky abdominal pain   Partial Large bowel obstruction (HCC) S/p Diverting colostomy --obstruction from Distal rectal mass, invasive colorectal adenocarcinoma  --advanced to solid food prior to discharge. --ostomy teaching done with pt and family   Distal rectal mass Invasive colorectal adenocarcinoma  --oncology consult, with Dr. Grayland Ormond --outpatient f/u with Dr. 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   Essential hypertension   Coronary artery disease   Diabetes mellitus without complication ([place])   30 Day Unplanned Readmission Risk Score    Flowsheet Row ED to Hosp-Admission (Current) from 07/26/2022 in [facility]  30 Day Unplanned Readmission Risk Score (%) 10.35 Filed at 07/31/2022 0801       This score is the patient's risk of an unplanned readmission within 30  days of being discharged (0 -100%). The score is based on dignosis, age, lab data, medications, orders, and past utilization.   Low:  0-14.9   Medium: 15-21.9   High: 22-29.9   Extreme: 30 and above         Discharge Instructions:  Allergies as of 07/31/2022   No Known Allergies      Medication List     TAKE these medications    aspirin EC 81 MG tablet Take 81 mg by mouth daily.   atorvastatin 40 MG tablet Commonly known as: LIPITOR TAKE 1 TABLET BY MOUTH EVERY DAY   clopidogrel 75 MG tablet Commonly known as: PLAVIX TAKE 1 TABLET BY MOUTH EVERY DAY   cyanocobalamin 500 MCG tablet Commonly known as: VITAMIN B12 Take 500 mcg by mouth daily.   famotidine 40 MG tablet Commonly known as: PEPCID Take 1 tablet (40 mg total) by mouth daily.   glimepiride 4 MG tablet Commonly known as: AMARYL Take 4 mg by mouth daily with breakfast.   HYDROcodone-acetaminophen 5-325 MG tablet Commonly known as: NORCO/VICODIN Take 1 tablet by mouth every 6 (six) hours as needed for up to 5 days for severe pain.   isosorbide mononitrate 30 MG 24 hr tablet Commonly known as: IMDUR TAKE 1 TABLET BY MOUTH EVERY DAY   metFORMIN 1000 MG tablet Commonly known as: GLUCOPHAGE Take 1,000 mg by mouth 2 (two) times daily with a meal.   nitroGLYCERIN 0.4 MG SL tablet Commonly known as: NITROSTAT Place 1 tablet (0.4 mg total) under the tongue every 5 (five) minutes as needed for chest pain.   pioglitazone 15 MG tablet Commonly known as: ACTOS Take 15 mg by  mouth daily.         Follow-up Information     [facility] Schedule an appointment as soon as possible for a visit.   Why: [facility] to make new patient appointment prior to discharge Contact information: [place] [address] [phone]         Lloyd Huger, MD Follow up.   Specialty: Oncology Contact information: [place] [address] [phone]                  No Known Allergies   The results of significant diagnostics from this hospitalization (including imaging, microbiology, ancillary and laboratory) are listed below for reference.   Consultations:   Procedures/Studies: CT ABDOMEN PELVIS W CONTRAST  Result Date: 07/26/2022 CLINICAL DATA:  Abdominal pain, acute, nonlocalized No stool for 2 weeks EXAM: CT ABDOMEN AND PELVIS WITH CONTRAST TECHNIQUE: Multidetector CT imaging of the abdomen and pelvis was performed using the standard protocol following bolus administration of intravenous contrast. RADIATION DOSE REDUCTION: This exam was performed according to the departmental dose-optimization program which includes automated exposure control, adjustment of the mA and/or kV according to patient size and/or use of iterative reconstruction technique. CONTRAST:  65m OMNIPAQUE IOHEXOL 350 MG/ML SOLN COMPARISON:  09/14/2008 FINDINGS: Lower chest: No acute abnormality Hepatobiliary: No focal hepatic abnormality. Gallbladder unremarkable. Pancreas: No focal abnormality or ductal dilatation. Spleen: No focal abnormality.  Normal size. Adrenals/Urinary Tract: Bilateral renal parapelvic cysts. No follow-up imaging recommended. Scarring in the upper pole of the right kidney. No hydronephrosis. Adrenal glands and urinary bladder unremarkable. Stomach/Bowel: Large stool burden and gaseous distention of the colon to the distal sigmoid colon/rectosigmoid junction. There is caliber change at this level. Cannot exclude colonic stricture or malignancy in this area. Normal appendix. Stomach and small bowel decompressed, unremarkable. Vascular/Lymphatic: Aortic atherosclerosis. No evidence of aneurysm or adenopathy. Reproductive: No visible focal abnormality. Other: No free fluid or free air. Musculoskeletal: No acute bony abnormality. IMPRESSION: Short-segment area of narrowing within the distal sigmoid near the rectosigmoid junction. Cannot exclude partially  obstructing stricture or malignancy. Proximal to this area, the colon is dilated with a large amount of gas and stool throughout the colon. Aortic atherosclerosis. Electronically Signed   By: KRolm BaptiseM.D.   On: 07/26/2022 18:57      Labs: BNP (last 3 results) No results for input(s): "BNP" in the last 8760 hours. Basic Metabolic Panel: Recent Labs  Lab 07/26/22 1756 07/30/22 0428 07/31/22 0426  NA 137 136 141  K 5.2* 3.5 3.5  CL 104 103 104  CO2 21* 28 29  GLUCOSE 107* 112* 97  BUN 24* 16 14  CREATININE 1.23 0.85 0.79  CALCIUM 9.4 7.4* 7.7*  MG  --  1.8 2.0   Liver Function Tests: Recent Labs  Lab 07/26/22 1756  AST 36  ALT 19  ALKPHOS 82  BILITOT 1.4*  PROT 7.3  ALBUMIN 4.1   No results for input(s): "LIPASE", "AMYLASE" in the last 168 hours. No results for input(s): "AMMONIA" in the last 168 hours. CBC: Recent Labs  Lab 07/26/22 1756 07/30/22 0428 07/31/22 0426  WBC 11.9* 8.1 8.5  NEUTROABS 8.5*  --   --   HGB 11.5* 7.9* 8.2*  HCT 37.2* 25.4* 26.3*  MCV 85.1 82.2 82.7  PLT 311 164 185   Cardiac Enzymes: No results for input(s): "CKTOTAL", "CKMB", "CKMBINDEX", "TROPONINI" in the last 168 hours. BNP: Invalid input(s): "POCBNP" CBG: Recent Labs  Lab 07/30/22  1110 07/30/22 1630 07/30/22 2018 07/30/22 2328 07/31/22 0359  GLUCAP 144* 162* 195* 138* 97   D-Dimer No results for input(s): "DDIMER" in the last 72 hours. Hgb A1c No results for input(s): "HGBA1C" in the last 72 hours. Lipid Profile No results for input(s): "CHOL", "HDL", "LDLCALC", "TRIG", "CHOLHDL", "LDLDIRECT" in the last 72 hours. Thyroid function studies No results for input(s): "TSH", "T4TOTAL", "T3FREE", "THYROIDAB" in the last 72 hours.  Invalid input(s): "FREET3" Anemia work up No results for input(s): "VITAMINB12", "FOLATE", "FERRITIN", "TIBC", "IRON", "RETICCTPCT" in the last 72 hours. Urinalysis    Component Value Date/Time   COLORURINE Yellow 12/20/2014 1419    APPEARANCEUR Clear 12/20/2014 1419   LABSPEC 1.036 12/20/2014 1419   PHURINE 5.0 12/20/2014 1419   GLUCOSEU >=500 12/20/2014 1419   HGBUR Negative 12/20/2014 1419   BILIRUBINUR Negative 12/20/2014 1419   KETONESUR 2+ 12/20/2014 1419   PROTEINUR Negative 12/20/2014 1419   NITRITE Negative 12/20/2014 1419   LEUKOCYTESUR Negative 12/20/2014 1419   Sepsis Labs Recent Labs  Lab 07/26/22 1756 07/30/22 0428 07/31/22 0426  WBC 11.9* 8.1 8.5   Microbiology No results found for this or any previous visit (from the past 240 hour(s)).   Total time spend on discharging this patient, including the last patient exam, discussing the hospital stay, instructions for ongoing care as it relates to all pertinent caregivers, as well as preparing the medical discharge records, prescriptions, and/or referrals as applicable, is 35 minutes.    Enzo Bi, MD  [facility] 07/31/2022, 8:31 AM

## 2022-08-01 DIAGNOSIS — Z483 Aftercare following surgery for neoplasm: Secondary | ICD-10-CM | POA: Diagnosis not present

## 2022-08-01 DIAGNOSIS — R001 Bradycardia, unspecified: Secondary | ICD-10-CM | POA: Diagnosis not present

## 2022-08-01 DIAGNOSIS — I2510 Atherosclerotic heart disease of native coronary artery without angina pectoris: Secondary | ICD-10-CM | POA: Diagnosis not present

## 2022-08-01 DIAGNOSIS — F17210 Nicotine dependence, cigarettes, uncomplicated: Secondary | ICD-10-CM | POA: Diagnosis not present

## 2022-08-01 DIAGNOSIS — E785 Hyperlipidemia, unspecified: Secondary | ICD-10-CM | POA: Diagnosis not present

## 2022-08-01 DIAGNOSIS — E119 Type 2 diabetes mellitus without complications: Secondary | ICD-10-CM | POA: Diagnosis not present

## 2022-08-01 DIAGNOSIS — C19 Malignant neoplasm of rectosigmoid junction: Secondary | ICD-10-CM | POA: Diagnosis not present

## 2022-08-01 DIAGNOSIS — I10 Essential (primary) hypertension: Secondary | ICD-10-CM | POA: Diagnosis not present

## 2022-08-01 DIAGNOSIS — Z87442 Personal history of urinary calculi: Secondary | ICD-10-CM | POA: Diagnosis not present

## 2022-08-01 LAB — CEA: CEA: 6.9 ng/mL — ABNORMAL HIGH (ref 0.0–4.7)

## 2022-08-03 ENCOUNTER — Telehealth: Payer: Self-pay

## 2022-08-03 ENCOUNTER — Other Ambulatory Visit: Payer: Self-pay

## 2022-08-03 DIAGNOSIS — C20 Malignant neoplasm of rectum: Secondary | ICD-10-CM

## 2022-08-03 NOTE — Telephone Encounter (Signed)
Error

## 2022-08-03 NOTE — Telephone Encounter (Signed)
Imaging and follow up has been arranged.

## 2022-08-03 NOTE — Telephone Encounter (Signed)
Imaging scheduled to complete staging for rectal cancer. Called and spoke to [name] [name] to go over instructions for scans and follow up with Dr. Grayland Ormond. He would like for his wife to take the information and she will call me when she returns home.

## 2022-08-05 DIAGNOSIS — D374 Neoplasm of uncertain behavior of colon: Secondary | ICD-10-CM | POA: Diagnosis not present

## 2022-08-05 DIAGNOSIS — Z933 Colostomy status: Secondary | ICD-10-CM | POA: Diagnosis not present

## 2022-08-05 DIAGNOSIS — I2510 Atherosclerotic heart disease of native coronary artery without angina pectoris: Secondary | ICD-10-CM | POA: Diagnosis not present

## 2022-08-05 DIAGNOSIS — I10 Essential (primary) hypertension: Secondary | ICD-10-CM | POA: Diagnosis not present

## 2022-08-05 DIAGNOSIS — K2100 Gastro-esophageal reflux disease with esophagitis, without bleeding: Secondary | ICD-10-CM | POA: Diagnosis not present

## 2022-08-05 DIAGNOSIS — E782 Mixed hyperlipidemia: Secondary | ICD-10-CM | POA: Diagnosis not present

## 2022-08-05 DIAGNOSIS — E1165 Type 2 diabetes mellitus with hyperglycemia: Secondary | ICD-10-CM | POA: Diagnosis not present

## 2022-08-06 ENCOUNTER — Other Ambulatory Visit: Payer: Medicare HMO

## 2022-08-07 ENCOUNTER — Ambulatory Visit
Admission: RE | Admit: 2022-08-07 | Discharge: 2022-08-07 | Disposition: A | Discharge status: Home / Self Care | Payer: Medicare HMO | Source: Ambulatory Visit | Attending: Oncology | Admitting: Oncology

## 2022-08-07 DIAGNOSIS — C20 Malignant neoplasm of rectum: Secondary | ICD-10-CM | POA: Diagnosis not present

## 2022-08-07 DIAGNOSIS — K6389 Other specified diseases of intestine: Secondary | ICD-10-CM | POA: Diagnosis not present

## 2022-08-07 DIAGNOSIS — K56609 Unspecified intestinal obstruction, unspecified as to partial versus complete obstruction: Secondary | ICD-10-CM | POA: Diagnosis not present

## 2022-08-07 DIAGNOSIS — J439 Emphysema, unspecified: Secondary | ICD-10-CM | POA: Diagnosis not present

## 2022-08-07 DIAGNOSIS — D492 Neoplasm of unspecified behavior of bone, soft tissue, and skin: Secondary | ICD-10-CM | POA: Diagnosis not present

## 2022-08-07 DIAGNOSIS — J984 Other disorders of lung: Secondary | ICD-10-CM | POA: Diagnosis not present

## 2022-08-07 HISTORY — DX: Malignant neoplasm of rectum: C20

## 2022-08-07 MED ORDER — IOHEXOL 300 MG/ML  SOLN
75.0000 mL | Freq: Once | INTRAMUSCULAR | Status: AC | PRN
  Administered 2022-08-07: 75 mL via INTRAVENOUS

## 2022-08-07 NOTE — Progress Notes (Signed)
[facility]  [phone] [phone]) [phone]  ID: [name] [name] OB: July 17, 1946  MR#: [id number]  GYI#:[id number]  Patient Care Team: System, Provider Not In as PCP - General [name] [name] November, MD as PCP - Cardiology (Cardiology) [name], MD as Consulting Physician (Cardiology) [name], RN as Oncology Nurse Navigator  CHIEF COMPLAINT: Stage IIa rectal adenocarcinoma.  INTERVAL HISTORY: Patient returns to clinic today for further evaluation, discussion of his final pathology results, and treatment planning.  He now has a colostomy bag.  He currently feels well.  He has no neurologic complaints.  He denies any recent fevers or illnesses.  He continues to have a poor appetite.  He has no chest pain, shortness of breath, cough, or hemoptysis.  He denies any nausea, vomiting, constipation, or diarrhea.  He does not report any melena or hematochezia.  He has no urinary complaints.  Patient offers no further specific complaints today.  REVIEW OF SYSTEMS:   Review of Systems  Constitutional: Negative.  Negative for fever, malaise/fatigue and weight loss.  Respiratory: Negative.  Negative for cough, hemoptysis and shortness of breath.   Cardiovascular: Negative.  Negative for chest pain and leg swelling.  Gastrointestinal: Negative.  Negative for abdominal pain.  Genitourinary: Negative.  Negative for dysuria.  Musculoskeletal: Negative.  Negative for back pain.  Skin: Negative.  Negative for rash.  Neurological: Negative.  Negative for dizziness, focal weakness, weakness and headaches.  Psychiatric/Behavioral: Negative.  The patient is not nervous/anxious.     As per HPI. Otherwise, a complete review of systems is negative.  PAST MEDICAL HISTORY: Past Medical History:  Diagnosis Date   Bradycardia    Coronary artery disease    Diabetes mellitus without complication ([place])    Hyperlipidemia    Hypertension    Nephrolithiasis      PAST SURGICAL HISTORY: Past Surgical History:  Procedure Laterality Date   CATARACT EXTRACTION     FLEXIBLE SIGMOIDOSCOPY N/A 07/27/2022   Procedure: FLEXIBLE SIGMOIDOSCOPY;  Surgeon: Lesly Rubenstein, MD;  Location: [facility] ENDOSCOPY;  Service: Endoscopy;  Laterality: N/A;   KIDNEY STONE SURGERY     NECK SURGERY  Feb 2013   nephrolithiasis     stent (other)     TRANSVERSE LOOP COLOSTOMY N/A 07/28/2022   Procedure: TRANSVERSE LOOP COLOSTOMY;  Surgeon: Ronny Bacon, MD;  Location: [facility] ORS;  Service: General;  Laterality: N/A;    FAMILY HISTORY: Family History  Problem Relation Age of Onset   Heart disease Father     ADVANCED DIRECTIVES (Y/N):  N  HEALTH MAINTENANCE: Social History   Tobacco Use   Smoking status: Every Day    Packs/day: 0.50    Years: 36.00    Total pack years: 18.00    Types: Cigarettes   Smokeless tobacco: Never  Vaping Use   Vaping Use: Never used  Substance Use Topics   Alcohol use: No   Drug use: No     Colonoscopy:  PAP:  Bone density:  Lipid panel:  No Known Allergies  Current Outpatient Medications  Medication Sig Dispense Refill   aspirin 81 MG EC tablet Take 81 mg by mouth daily.       atorvastatin (LIPITOR) 40 MG tablet TAKE 1 TABLET BY MOUTH EVERY DAY 90 tablet 0   clopidogrel (PLAVIX) 75 MG tablet TAKE 1 TABLET BY MOUTH EVERY DAY 90 tablet 3   cyanocobalamin (VITAMIN B12) 500 MCG tablet Take 500 mcg by mouth daily.  famotidine (PEPCID) 40 MG tablet Take 1 tablet (40 mg total) by mouth daily. 90 tablet 3   glimepiride (AMARYL) 4 MG tablet Take 4 mg by mouth daily with breakfast.     HYDROcodone-acetaminophen (NORCO/VICODIN) 5-325 MG tablet Take 1 tablet by mouth every 6 (six) hours as needed for up to 5 days for severe pain. 20 tablet 0   isosorbide mononitrate (IMDUR) 30 MG 24 hr tablet TAKE 1 TABLET BY MOUTH EVERY DAY 90 tablet 3   metFORMIN (GLUCOPHAGE) 1000 MG tablet Take 1,000 mg by mouth 2 (two) times daily with a  meal.      nitroGLYCERIN (NITROSTAT) 0.4 MG SL tablet Place 1 tablet (0.4 mg total) under the tongue every 5 (five) minutes as needed for chest pain. 25 tablet 6   pioglitazone (ACTOS) 15 MG tablet Take 15 mg by mouth daily.     No current facility-administered medications for this visit.    OBJECTIVE: Vitals:   08/13/22 1322  BP: (!) 157/58  Pulse: 78  Resp: 16  Temp: (!) 97.3 F (36.3 C)  SpO2: 100%     Body mass index is 24.28 kg/m.    ECOG FS:0 - Asymptomatic  General: Well-developed, well-nourished, no acute distress. Eyes: Pink conjunctiva, anicteric sclera. HEENT: Normocephalic, moist mucous membranes. Lungs: No audible wheezing or coughing. Heart: Regular rate and rhythm. Abdomen: Soft, nontender, no obvious distention.  Colostomy bag noted. Musculoskeletal: No edema, cyanosis, or clubbing. Neuro: Alert, answering all questions appropriately. Cranial nerves grossly intact. Skin: No rashes or petechiae noted. Psych: Normal affect. Lymphatics: No cervical, calvicular, axillary or inguinal LAD.   LAB RESULTS:  Lab Results  Component Value Date   NA 141 07/31/2022   K 3.5 07/31/2022   CL 104 07/31/2022   CO2 29 07/31/2022   GLUCOSE 97 07/31/2022   BUN 14 07/31/2022   CREATININE 0.79 07/31/2022   CALCIUM 7.7 (L) 07/31/2022   PROT 7.3 07/26/2022   ALBUMIN 4.1 07/26/2022   AST 36 07/26/2022   ALT 19 07/26/2022   ALKPHOS 82 07/26/2022   BILITOT 1.4 (H) 07/26/2022   GFRNONAA >60 07/31/2022   GFRAA >60 12/20/2014    Lab Results  Component Value Date   WBC 8.5 07/31/2022   NEUTROABS 8.5 (H) 07/26/2022   HGB 8.2 (L) 07/31/2022   HCT 26.3 (L) 07/31/2022   MCV 82.7 07/31/2022   PLT 185 07/31/2022     STUDIES: MR PELVIS WO CM RECTAL CA STAGING  Result Date: 08/10/2022 CLINICAL DATA:  New diagnosis of rectal cancer. Large fungating mass 10 cm from anal verge on sigmoidoscopy. EXAM: MRI PELVIS WITHOUT CONTRAST TECHNIQUE: Multiplanar multisequence MR imaging  of the pelvis was performed. No intravenous contrast was administered. Ultrasound gel was administered per rectum to optimize tumor evaluation. COMPARISON:  CT of 08/07/2022.  Sigmoidoscopy report 07/27/2022 FINDINGS: Evaluation is somewhat challenging secondary to the position at the rectosigmoid junction. TUMOR LOCATION Tumor distance from Anal Verge/Skin Surface:  10.7 cm, image 24/12 Tumor distance to Internal Anal Sphincter: 9.6 cm, image 27/7 TUMOR DESCRIPTION Circumferential Extent: Circumferentiala, including on 11/08 and 27/7 Tumor Length: 2.5 cm, image 19/3 T - CATEGORY Extension through [employer]: Small volume on the left at 3 mm on 10/08-T3b Shortest Distance of any tumor/node from Mesorectal Fascia: 1.9 cm on 10/08 Extramural Vascular Invasion/Tumor Thrombus: No Invasion of Anterior Peritoneal Reflection: No Involvement of Adjacent Organs or Pelvic Sidewall: No Levator Ani Involvement: No N - CATEGORY Mesorectal Lymph Nodes None=N0 Extra-mesorectal Lymphadenopathy: No Other:  Trace free pelvic fluid. Small bilateral hydroceles. Normal urinary bladder and prostate. Resolution of upstream bowel obstruction. Proximal sigmoid colonic wall thickening could be due to underdistention but is indeterminate. Scattered colonic diverticula. IMPRESSION: Rectal adenocarcinoma T stage: T3b Rectal adenocarcinoma N stage:  0 Distance from tumor to the internal anal sphincter is 9.6 cm. Electronically Signed   By: Abigail Miyamoto M.D.   On: 08/10/2022 08:30   CT Chest W Contrast  Result Date: 08/08/2022 CLINICAL DATA:  76 year old male with history of new diagnosis of rectal cancer. Staging examination. * Tracking Code: BO * EXAM: CT CHEST WITH CONTRAST TECHNIQUE: Multidetector CT imaging of the chest was performed during intravenous contrast administration. RADIATION DOSE REDUCTION: This exam was performed according to the departmental dose-optimization program which includes automated exposure control,  adjustment of the mA and/or kV according to patient size and/or use of iterative reconstruction technique. CONTRAST:  31m OMNIPAQUE IOHEXOL 300 MG/ML  SOLN COMPARISON:  Chest CT 12/14/2014. FINDINGS: Cardiovascular: Heart size is normal. There is no significant pericardial fluid, thickening or pericardial calcification. There is aortic atherosclerosis, as well as atherosclerosis of the great vessels of the mediastinum and the coronary arteries, including calcified atherosclerotic plaque in the left main, left anterior descending, left circumflex and right coronary arteries. Mediastinum/Nodes: No pathologically enlarged mediastinal or hilar lymph nodes. Esophagus is unremarkable in appearance. No axillary lymphadenopathy. Lungs/Pleura: Some patchy areas of very mild ground-glass attenuation, septal thickening and centrilobular ground-glass attenuation micro nodularity are noted in the right lower lobe. No other larger more suspicious appearing pulmonary nodules or masses are noted. No acute consolidative airspace disease. No pleural effusions. Mild diffuse bronchial wall thickening with mild centrilobular and paraseptal emphysema. Upper Abdomen: Aortic atherosclerosis. Left upper quadrant colostomy incompletely imaged. Musculoskeletal: There are no aggressive appearing lytic or blastic lesions noted in the visualized portions of the skeleton. Orthopedic fixation hardware in the lower cervical spine incidentally noted. IMPRESSION: 1. No definitive findings to suggest metastatic disease to the thorax. 2. There are some nonspecific findings in the right lower lobe which likely reflect resolving infection. Repeat noncontrast chest CT should be considered in 12 months to ensure the stability or regression of these findings. 3. Mild diffuse bronchial wall thickening with mild centrilobular and paraseptal emphysema; imaging findings suggestive of underlying COPD. 4. Aortic atherosclerosis, in addition to left main and  three-vessel coronary artery disease. Assessment for potential risk factor modification, dietary therapy or pharmacologic therapy may be warranted, if clinically indicated. Aortic Atherosclerosis (ICD10-I70.0) and Emphysema (ICD10-J43.9). Electronically Signed   By: DVinnie LangtonM.D.   On: 08/08/2022 12:32   CT ABDOMEN PELVIS W CONTRAST  Result Date: 07/26/2022 CLINICAL DATA:  Abdominal pain, acute, nonlocalized No stool for 2 weeks EXAM: CT ABDOMEN AND PELVIS WITH CONTRAST TECHNIQUE: Multidetector CT imaging of the abdomen and pelvis was performed using the standard protocol following bolus administration of intravenous contrast. RADIATION DOSE REDUCTION: This exam was performed according to the departmental dose-optimization program which includes automated exposure control, adjustment of the mA and/or kV according to patient size and/or use of iterative reconstruction technique. CONTRAST:  872mOMNIPAQUE IOHEXOL 350 MG/ML SOLN COMPARISON:  09/14/2008 FINDINGS: Lower chest: No acute abnormality Hepatobiliary: No focal hepatic abnormality. Gallbladder unremarkable. Pancreas: No focal abnormality or ductal dilatation. Spleen: No focal abnormality.  Normal size. Adrenals/Urinary Tract: Bilateral renal parapelvic cysts. No follow-up imaging recommended. Scarring in the upper pole of the right kidney. No hydronephrosis. Adrenal glands and urinary bladder unremarkable. Stomach/Bowel: Large stool burden and  gaseous distention of the colon to the distal sigmoid colon/rectosigmoid junction. There is caliber change at this level. Cannot exclude colonic stricture or malignancy in this area. Normal appendix. Stomach and small bowel decompressed, unremarkable. Vascular/Lymphatic: Aortic atherosclerosis. No evidence of aneurysm or adenopathy. Reproductive: No visible focal abnormality. Other: No free fluid or free air. Musculoskeletal: No acute bony abnormality. IMPRESSION: Short-segment area of narrowing within the  distal sigmoid near the rectosigmoid junction. Cannot exclude partially obstructing stricture or malignancy. Proximal to this area, the colon is dilated with a large amount of gas and stool throughout the colon. Aortic atherosclerosis. Electronically Signed   By: Rolm Baptise M.D.   On: 07/26/2022 18:57    ASSESSMENT: Stage IIa rectal adenocarcinoma.  PLAN:    Stage IIa rectal adenocarcinoma: Diagnosis confirmed by imaging and biopsy.  MRI and CT scan results reviewed independently and reported as above confirming stage of disease.  Patient will benefit from neoadjuvant FOLFOX every 2 weeks x 8 cycles followed by concurrent Xeloda and daily XRT.  Finally, patient will benefit from definitive surgical intervention of his residual malignancy.  Unclear if colostomy is reversible.  A referral was sent to surgery for port placement.  Return to clinic in 1 to 2 weeks for further evaluation and initiation of cycle 1 of 8 of neoadjuvant FOLFOX.   Abdominal pain: Improved with diverting colostomy. Anemia: Heme has trended down to 8.2.  Monitor.  Patient expressed understanding and was in agreement with this plan. He also understands that He can call clinic at any time with any questions, concerns, or complaints.    Cancer Staging  Rectal adenocarcinoma [facility]) Staging form: Colon and Rectum, AJCC 8th Edition - Clinical stage from 08/13/2022: Stage IIA (cT3, cN0, cM0) - Signed by Lloyd Huger, MD on 08/13/2022 Total positive nodes: 0  Lloyd Huger, MD   08/14/2022 12:52 PM

## 2022-08-10 ENCOUNTER — Ambulatory Visit (HOSPITAL_COMMUNITY)
Admission: RE | Admit: 2022-08-10 | Discharge: 2022-08-10 | Disposition: A | Discharge status: Home / Self Care | Payer: Medicare HMO | Source: Ambulatory Visit | Attending: Hospitalist | Admitting: Hospitalist

## 2022-08-10 DIAGNOSIS — L24B3 Irritant contact dermatitis related to fecal or urinary stoma or fistula: Secondary | ICD-10-CM

## 2022-08-10 DIAGNOSIS — K9400 Colostomy complication, unspecified: Secondary | ICD-10-CM

## 2022-08-10 DIAGNOSIS — Z933 Colostomy status: Secondary | ICD-10-CM | POA: Diagnosis not present

## 2022-08-10 DIAGNOSIS — L309 Dermatitis, unspecified: Secondary | ICD-10-CM | POA: Diagnosis not present

## 2022-08-10 NOTE — Progress Notes (Signed)
[facility]   Reason for visit:  LLQ loop colostomy with retention rod in place.  Has been pouching at home with assistance from wife. Concerned about running out of supplies. I will send them home with some today. Has peristomal erythema, intact HPI:  Colonic stricture with transverse loop colostomy ROS  Review of Systems  Gastrointestinal:  Positive for constipation.       LLQ loop colostomy  Skin:  Positive for color change.       Peristomal redness  Psychiatric/Behavioral: Negative.    All other systems reviewed and are negative.  Vital signs:  BP (!) 141/82 (BP Location: Right Arm)   Pulse 86   Temp 97.6 F (36.4 C) (Oral)   Resp 18   SpO2 99%  Exam:  Physical Exam Vitals reviewed.  Constitutional:      Appearance: Normal appearance.  HENT:     Head:     Comments: Hard of hearing Abdominal:     Palpations: Abdomen is soft.  Skin:    Findings: Erythema present.  Neurological:     Mental Status: He is alert and oriented to person, place, and time.  Psychiatric:        Mood and Affect: Mood normal.        Behavior: Behavior normal.     Stoma type/location:  LLQ colostomy with retention rod in place Stomal assessment/size:  2 1/4" with rod.  Pink and moist Peristomal assessment:  intact erythema.   Treatment options for stomal/peristomal skin: Barrier ring applied under rod.  Demonstrated to wife. She had applied over rod and seal not maintained.  Output: soft brown stool,. Thick and pasty.  I recommended adding Miralax once daily.  Ostomy pouching: 2pc.2 3/4" pouch with barrier ring  Education provided:  Demonstrated to pull ring in half and wrap around rod beneath, against skin. We applied powder and skin prep to peristomal irritation.   [facility] nurse continues to work with them and is ordering supplies. They are concerned they may run out, so I provided 2 additional pouch sets     Impression/dx  Irritant dermatitis Loop colostomy Discussion  See  above Plan  See back in 1-2 weeks as needed.     Visit time: 55 minutes.   Domenic Moras FNP-BC

## 2022-08-10 NOTE — Discharge Instructions (Signed)
Once home health nurse discharges, call clinic and I will send prescription to [facility] for supplies.  [phone]  Call clinic as needed for appointment or supplies- [phone]

## 2022-08-11 ENCOUNTER — Other Ambulatory Visit: Payer: Self-pay | Admitting: Physician Assistant

## 2022-08-11 DIAGNOSIS — K9400 Colostomy complication, unspecified: Secondary | ICD-10-CM | POA: Insufficient documentation

## 2022-08-11 DIAGNOSIS — L24B3 Irritant contact dermatitis related to fecal or urinary stoma or fistula: Secondary | ICD-10-CM | POA: Insufficient documentation

## 2022-08-11 MED ORDER — HYDROCODONE-ACETAMINOPHEN 5-325 MG PO TABS: 1.0000 | ORAL_TABLET | Freq: Four times a day (QID) | ORAL | 0 refills | Status: AC | PRN

## 2022-08-13 ENCOUNTER — Encounter: Payer: Self-pay | Admitting: Oncology

## 2022-08-13 ENCOUNTER — Inpatient Hospital Stay: Payer: Medicare HMO | Attending: Oncology | Admitting: Oncology

## 2022-08-13 VITALS — BP 157/58 | HR 78 | Temp 97.3°F | Resp 16 | Ht 72.0 in | Wt 179.0 lb

## 2022-08-13 DIAGNOSIS — C20 Malignant neoplasm of rectum: Secondary | ICD-10-CM | POA: Diagnosis not present

## 2022-08-13 DIAGNOSIS — Z7902 Long term (current) use of antithrombotics/antiplatelets: Secondary | ICD-10-CM | POA: Diagnosis not present

## 2022-08-13 DIAGNOSIS — E119 Type 2 diabetes mellitus without complications: Secondary | ICD-10-CM | POA: Diagnosis not present

## 2022-08-13 DIAGNOSIS — Z79899 Other long term (current) drug therapy: Secondary | ICD-10-CM | POA: Diagnosis not present

## 2022-08-13 DIAGNOSIS — Z7984 Long term (current) use of oral hypoglycemic drugs: Secondary | ICD-10-CM | POA: Insufficient documentation

## 2022-08-13 DIAGNOSIS — D649 Anemia, unspecified: Secondary | ICD-10-CM | POA: Insufficient documentation

## 2022-08-13 DIAGNOSIS — F17210 Nicotine dependence, cigarettes, uncomplicated: Secondary | ICD-10-CM | POA: Diagnosis not present

## 2022-08-13 DIAGNOSIS — E785 Hyperlipidemia, unspecified: Secondary | ICD-10-CM | POA: Diagnosis not present

## 2022-08-13 DIAGNOSIS — Z7982 Long term (current) use of aspirin: Secondary | ICD-10-CM | POA: Diagnosis not present

## 2022-08-13 DIAGNOSIS — I10 Essential (primary) hypertension: Secondary | ICD-10-CM | POA: Diagnosis not present

## 2022-08-13 DIAGNOSIS — I2510 Atherosclerotic heart disease of native coronary artery without angina pectoris: Secondary | ICD-10-CM | POA: Diagnosis not present

## 2022-08-14 ENCOUNTER — Other Ambulatory Visit (HOSPITAL_COMMUNITY): Payer: Self-pay | Admitting: Nurse Practitioner

## 2022-08-14 ENCOUNTER — Encounter: Payer: Self-pay | Admitting: Oncology

## 2022-08-14 DIAGNOSIS — K9400 Colostomy complication, unspecified: Secondary | ICD-10-CM

## 2022-08-14 NOTE — Progress Notes (Signed)
Met with [name] [name] and his spouse. Introduced Therapist, nutritional and provided contact information for future needs. Taking care of colostomy well. Has supplies at home. Dr. Christian Mate to place port. New referral sent and Dr. Christian Mate notified of request.

## 2022-08-14 NOTE — Progress Notes (Signed)
START ON PATHWAY REGIMEN - Colorectal     A cycle is every 14 days:     Oxaliplatin      Leucovorin      Fluorouracil      Fluorouracil   **Always confirm dose/schedule in your pharmacy ordering system**  Patient Characteristics: Preoperative or Nonsurgical Candidate, M0 (Clinical Staging), Rectal, cT2, cN1 or cT3, cN0-1, and Candidate for Sphincter-sparing Surgery Tumor Location: Rectal Therapeutic Status: Preoperative or Nonsurgical Candidate, M0 (Clinical Staging) AJCC T Category: cT3 AJCC N Category: cN0 AJCC M Category: cM0 AJCC 8 Stage Grouping: IIA Intent of Therapy: Curative Intent, Discussed with Patient

## 2022-08-15 ENCOUNTER — Other Ambulatory Visit: Payer: Self-pay

## 2022-08-15 NOTE — Progress Notes (Unsigned)
Cardiology Office Note  Date:  08/15/2022   ID:  [name] [name], [name] [name] 1946/09/30, MRN [id number]  PCP:  System, Provider Not In   No chief complaint on file.   HPI:  [name] [name] is a 76 year old gentleman with  coronary artery disease,  occluded left circumflex, severely diseased proximal RCA with stent placed December 2009, with no intervention performed on the circumflex secondary to significant collateral circulation,  who continues to smoke one pack per day,  hyperlipidemia,  episodes of chest pain who previously worked in trucking, now retired,  who presents for routine follow up of his coronary artery disease.   Last seen by myself September 2022 Takes care of father age 69 yo M[place] ,weedeats  Goes over to sit with father every evening Shares time with brother   Followed by endocrine, [place]   Labs reviewed HBA1C 7.0, down 8.0  BMP normal, elevated glucose Total chol 110, LDL 45   still smoking, no desire to quit  EKG personally reviewed by myself on todays visit nsr rate 59 bpm  , no significant ST or T wave changes  Other past medical history reviewed Previous episode hospital admission 2016 for fatigue found to have glucose levels more than 400   Status post neck surgery, Lost significant weight Previously had  occasional episodes of dizziness.   PMH:   has a past medical history of Bradycardia, Coronary artery disease, Diabetes mellitus without complication ([place]), Hyperlipidemia, Hypertension, and Nephrolithiasis.  PSH:    Past Surgical History:  Procedure Laterality Date   CATARACT EXTRACTION     FLEXIBLE SIGMOIDOSCOPY N/A 07/27/2022   Procedure: FLEXIBLE SIGMOIDOSCOPY;  Surgeon: Lesly Rubenstein, MD;  Location: [facility] ENDOSCOPY;  Service: Endoscopy;  Laterality: N/A;   KIDNEY STONE SURGERY     NECK SURGERY  Feb 2013   nephrolithiasis     stent (other)     TRANSVERSE LOOP COLOSTOMY N/A 07/28/2022   Procedure: TRANSVERSE LOOP COLOSTOMY;  Surgeon:  Ronny Bacon, MD;  Location: [facility] ORS;  Service: General;  Laterality: N/A;    Current Outpatient Medications  Medication Sig Dispense Refill   aspirin 81 MG EC tablet Take 81 mg by mouth daily.       atorvastatin (LIPITOR) 40 MG tablet TAKE 1 TABLET BY MOUTH EVERY DAY 90 tablet 0   clopidogrel (PLAVIX) 75 MG tablet TAKE 1 TABLET BY MOUTH EVERY DAY 90 tablet 3   cyanocobalamin (VITAMIN B12) 500 MCG tablet Take 500 mcg by mouth daily.     famotidine (PEPCID) 40 MG tablet Take 1 tablet (40 mg total) by mouth daily. 90 tablet 3   glimepiride (AMARYL) 4 MG tablet Take 4 mg by mouth daily with breakfast.     HYDROcodone-acetaminophen (NORCO/VICODIN) 5-325 MG tablet Take 1 tablet by mouth every 6 (six) hours as needed for up to 5 days for severe pain. 20 tablet 0   isosorbide mononitrate (IMDUR) 30 MG 24 hr tablet TAKE 1 TABLET BY MOUTH EVERY DAY 90 tablet 3   metFORMIN (GLUCOPHAGE) 1000 MG tablet Take 1,000 mg by mouth 2 (two) times daily with a meal.      nitroGLYCERIN (NITROSTAT) 0.4 MG SL tablet Place 1 tablet (0.4 mg total) under the tongue every 5 (five) minutes as needed for chest pain. 25 tablet 6   pioglitazone (ACTOS) 15 MG tablet Take 15 mg by mouth daily.     No current facility-administered medications for this visit.    Allergies:   Patient has no known allergies.  Social History:  The patient  reports that he has been smoking cigarettes. He has a 18.00 pack-year smoking history. He has never used smokeless tobacco. He reports that he does not drink alcohol and does not use drugs.   Family History:   Father had CAD, CABG age 63s   Review of Systems: Review of Systems  Constitutional: Negative.   HENT: Negative.    Respiratory: Negative.    Cardiovascular: Negative.   Gastrointestinal: Negative.   Musculoskeletal: Negative.   Neurological: Negative.   Psychiatric/Behavioral: Negative.    All other systems reviewed and are negative.   PHYSICAL EXAM: VS:  There were  no vitals taken for this visit. , BMI There is no height or weight on file to calculate BMI.  Constitutional:  oriented to person, place, and time. No distress.  HENT:  Head: Grossly normal Eyes:  no discharge. No scleral icterus.  Neck: No JVD, no carotid bruits  Cardiovascular: Regular rate and rhythm, no murmurs appreciated Pulmonary/Chest: Clear to auscultation bilaterally, no wheezes or rails Abdominal: Soft.  no distension.  no tenderness.  Musculoskeletal: Normal range of motion Neurological:  normal muscle tone. Coordination normal. No atrophy Skin: Skin warm and dry Psychiatric: normal affect, pleasant  Recent Labs: 07/26/2022: ALT 19 07/31/2022: BUN 14; Creatinine, Ser 0.79; Hemoglobin 8.2; Magnesium 2.0; Platelets 185; Potassium 3.5; Sodium 141    Lipid Panel Lab Results  Component Value Date   CHOL 102 10/19/2017   HDL 30 (L) 10/19/2017   LDLCALC 40 10/19/2017   TRIG 158 (H) 10/19/2017      Wt Readings from Last 3 Encounters:  08/13/22 179 lb (81.2 kg)  07/26/22 179 lb 4.8 oz (81.3 kg)  08/15/21 193 lb 2 oz (87.6 kg)     ASSESSMENT AND PLAN:  Atherosclerosis of native coronary artery without angina pectoris, unspecified whether native or transplanted heart -  Currently with no symptoms of angina. No further workup at this time. Continue current medication regimen.  TOBACCO ABUSE Still smoking We have encouraged him to continue to work on weaning his cigarettes and smoking cessation. He will continue to work on this and does not want any assistance with chantix.   Uncontrolled type 2 diabetes mellitus with hyperosmolarity without coma, without long-term current use of insulin (HCC) Improved HBA1C , followed by endocrine  Mixed hyperlipidemia Cholesterol is at goal on the current lipid regimen. No changes to the medications were made.  Essential hypertension Blood pressure is well controlled on today's visit. No changes made to the medications.  Adjustment  disorder Significant stress taking care of her father every evening, father 27 years old No time for himself in retirement   Total encounter time more than 25 minutes  Greater than 50% was spent in counseling and coordination of care with the patient    No orders of the defined types were placed in this encounter.     Signed, Esmond Plants, M.D., Ph.D. 08/15/2022  [place], [place]

## 2022-08-17 ENCOUNTER — Ambulatory Visit: Payer: Medicare HMO | Attending: Cardiovascular Disease | Admitting: Cardiovascular Disease

## 2022-08-17 ENCOUNTER — Telehealth: Payer: Self-pay

## 2022-08-17 ENCOUNTER — Encounter: Payer: Self-pay | Admitting: Cardiovascular Disease

## 2022-08-17 VITALS — BP 140/62 | HR 84 | Ht 72.0 in | Wt 178.0 lb

## 2022-08-17 DIAGNOSIS — E1100 Type 2 diabetes mellitus with hyperosmolarity without nonketotic hyperglycemic-hyperosmolar coma (NKHHC): Secondary | ICD-10-CM | POA: Diagnosis not present

## 2022-08-17 DIAGNOSIS — E782 Mixed hyperlipidemia: Secondary | ICD-10-CM | POA: Diagnosis not present

## 2022-08-17 DIAGNOSIS — I25118 Atherosclerotic heart disease of native coronary artery with other forms of angina pectoris: Secondary | ICD-10-CM | POA: Diagnosis not present

## 2022-08-17 DIAGNOSIS — I10 Essential (primary) hypertension: Secondary | ICD-10-CM | POA: Diagnosis not present

## 2022-08-17 DIAGNOSIS — F17200 Nicotine dependence, unspecified, uncomplicated: Secondary | ICD-10-CM

## 2022-08-17 MED ORDER — ATORVASTATIN CALCIUM 40 MG PO TABS: 40.0000 mg | ORAL_TABLET | Freq: Every day | ORAL | 3 refills | Status: DC

## 2022-08-17 MED ORDER — NITROGLYCERIN 0.4 MG SL SUBL: 0.4000 mg | SUBLINGUAL_TABLET | SUBLINGUAL | 6 refills | Status: AC | PRN

## 2022-08-17 MED ORDER — ISOSORBIDE MONONITRATE ER 30 MG PO TB24: 30.0000 mg | ORAL_TABLET | Freq: Every day | ORAL | 3 refills | Status: DC

## 2022-08-17 NOTE — Telephone Encounter (Signed)
Followed up with Dr. Forest Becker office about port placement. They stated they received the referral and they will address it on 10/5 when he goes in for is post-op appointment.

## 2022-08-17 NOTE — Patient Instructions (Addendum)
Ask oncology about anemia, need for iron level check?  Increase calorie intake  Medication Instructions:  Stop the plavix, stay on aspirin  If you need a refill on your cardiac medications before your next appointment, please call your pharmacy.   Lab work: No new labs needed  Testing/Procedures: No new testing needed  Follow-Up: At [facility], you and your health needs are our priority.  As part of our continuing mission to provide you with exceptional heart care, we have created designated Provider Care Teams.  These Care Teams include your primary Cardiologist (physician) and Advanced Practice Providers (APPs -  Physician Assistants and Nurse Practitioners) who all work together to provide you with the care you need, when you need it.  You will need a follow up appointment in 6 months  Providers on your designated Care Team:   Murray Hodgkins, NP Christell Faith, PA-C Cadence Kathlen Mody, [place]  COVID-19 Vaccine Information can be found at: [email or web address] For questions related to vaccine distribution or appointments, please email [email or web address] or call [phone].  , need for iron check

## 2022-08-18 ENCOUNTER — Other Ambulatory Visit: Payer: Self-pay | Admitting: Cardiovascular Disease

## 2022-08-18 ENCOUNTER — Other Ambulatory Visit: Payer: Self-pay

## 2022-08-18 ENCOUNTER — Inpatient Hospital Stay: Payer: Medicare HMO | Attending: Oncology

## 2022-08-18 DIAGNOSIS — D72819 Decreased white blood cell count, unspecified: Secondary | ICD-10-CM | POA: Insufficient documentation

## 2022-08-18 DIAGNOSIS — J432 Centrilobular emphysema: Secondary | ICD-10-CM | POA: Insufficient documentation

## 2022-08-18 DIAGNOSIS — I2510 Atherosclerotic heart disease of native coronary artery without angina pectoris: Secondary | ICD-10-CM | POA: Insufficient documentation

## 2022-08-18 DIAGNOSIS — I700 Atherosclerosis of aorta: Secondary | ICD-10-CM | POA: Insufficient documentation

## 2022-08-18 DIAGNOSIS — I10 Essential (primary) hypertension: Secondary | ICD-10-CM | POA: Insufficient documentation

## 2022-08-18 DIAGNOSIS — Z7984 Long term (current) use of oral hypoglycemic drugs: Secondary | ICD-10-CM | POA: Insufficient documentation

## 2022-08-18 DIAGNOSIS — Z7902 Long term (current) use of antithrombotics/antiplatelets: Secondary | ICD-10-CM | POA: Insufficient documentation

## 2022-08-18 DIAGNOSIS — D649 Anemia, unspecified: Secondary | ICD-10-CM | POA: Insufficient documentation

## 2022-08-18 DIAGNOSIS — C20 Malignant neoplasm of rectum: Secondary | ICD-10-CM | POA: Insufficient documentation

## 2022-08-18 DIAGNOSIS — E119 Type 2 diabetes mellitus without complications: Secondary | ICD-10-CM | POA: Insufficient documentation

## 2022-08-18 DIAGNOSIS — F17210 Nicotine dependence, cigarettes, uncomplicated: Secondary | ICD-10-CM | POA: Insufficient documentation

## 2022-08-18 DIAGNOSIS — Z79899 Other long term (current) drug therapy: Secondary | ICD-10-CM | POA: Insufficient documentation

## 2022-08-18 DIAGNOSIS — R109 Unspecified abdominal pain: Secondary | ICD-10-CM | POA: Insufficient documentation

## 2022-08-18 DIAGNOSIS — E785 Hyperlipidemia, unspecified: Secondary | ICD-10-CM | POA: Insufficient documentation

## 2022-08-18 DIAGNOSIS — Z7982 Long term (current) use of aspirin: Secondary | ICD-10-CM | POA: Insufficient documentation

## 2022-08-18 DIAGNOSIS — K219 Gastro-esophageal reflux disease without esophagitis: Secondary | ICD-10-CM | POA: Insufficient documentation

## 2022-08-18 DIAGNOSIS — Z5111 Encounter for antineoplastic chemotherapy: Secondary | ICD-10-CM | POA: Insufficient documentation

## 2022-08-18 MED ORDER — PROCHLORPERAZINE MALEATE 10 MG PO TABS: 10.0000 mg | ORAL_TABLET | Freq: Four times a day (QID) | ORAL | 2 refills | Status: DC | PRN

## 2022-08-18 MED ORDER — LIDOCAINE-PRILOCAINE 2.5-2.5 % EX CREA: TOPICAL_CREAM | CUTANEOUS | 3 refills | Status: DC

## 2022-08-18 MED ORDER — ONDANSETRON HCL 8 MG PO TABS: 8.0000 mg | ORAL_TABLET | Freq: Three times a day (TID) | ORAL | 2 refills | Status: DC | PRN

## 2022-08-19 NOTE — H&P (View-Only) (Signed)
Patient ID: [name] [name], male   DOB: 1945-12-30, 76 y.o.   MRN: [id number]  Chief Complaint: Follow-up for loop colostomy, need for Port-A-Cath to initiate chemotherapy.  History of Present Illness [name] [name] is a 76 y.o. male with essentially no complaints considering his loop colostomy status.  Pain appears to be well controlled.  Denies fevers chills or abdominal pain.  Denies nausea, vomiting.  Reports minimal rectal bleeding. No prior right upper extremity injury/collarbone fracture, or prior deep vascular intervention.  Past Medical History Past Medical History:  Diagnosis Date   Bradycardia    Coronary artery disease    Diabetes mellitus without complication ([place])    Hyperlipidemia    Hypertension    Nephrolithiasis       Past Surgical History:  Procedure Laterality Date   CATARACT EXTRACTION     FLEXIBLE SIGMOIDOSCOPY N/A 07/27/2022   Procedure: FLEXIBLE SIGMOIDOSCOPY;  Surgeon: Lesly Rubenstein, MD;  Location: [facility] ENDOSCOPY;  Service: Endoscopy;  Laterality: N/A;   KIDNEY STONE SURGERY     NECK SURGERY  Feb 2013   nephrolithiasis     stent (other)     TRANSVERSE LOOP COLOSTOMY N/A 07/28/2022   Procedure: TRANSVERSE LOOP COLOSTOMY;  Surgeon: Ronny Bacon, MD;  Location: [facility] ORS;  Service: General;  Laterality: N/A;    No Known Allergies  Current Outpatient Medications  Medication Sig Dispense Refill   aspirin 81 MG EC tablet Take 81 mg by mouth daily.       atorvastatin (LIPITOR) 40 MG tablet Take 1 tablet (40 mg total) by mouth daily. 90 tablet 3   cyanocobalamin (VITAMIN B12) 500 MCG tablet Take 500 mcg by mouth daily.     famotidine (PEPCID) 40 MG tablet Take 1 tablet (40 mg total) by mouth daily. 90 tablet 3   isosorbide mononitrate (IMDUR) 30 MG 24 hr tablet Take 1 tablet (30 mg total) by mouth daily. 90 tablet 3   lidocaine-prilocaine (EMLA) cream Apply to affected area once 30 g 3   nitroGLYCERIN (NITROSTAT) 0.4 MG SL tablet Place 1  tablet (0.4 mg total) under the tongue every 5 (five) minutes as needed for chest pain. 25 tablet 6   ondansetron (ZOFRAN) 8 MG tablet Take 1 tablet (8 mg total) by mouth every 8 (eight) hours as needed for nausea or vomiting. 60 tablet 2   pioglitazone (ACTOS) 15 MG tablet Take 15 mg by mouth daily.     prochlorperazine (COMPAZINE) 10 MG tablet Take 1 tablet (10 mg total) by mouth every 6 (six) hours as needed for nausea or vomiting. 60 tablet 2   glimepiride (AMARYL) 4 MG tablet Take 4 mg by mouth daily with breakfast.     metFORMIN (GLUCOPHAGE) 1000 MG tablet Take 1,000 mg by mouth 2 (two) times daily with a meal.      No current facility-administered medications for this visit.    Family History Family History  Problem Relation Age of Onset   Heart disease Father       Social History Social History   Tobacco Use   Smoking status: Every Day    Packs/day: 0.50    Years: 36.00    Total pack years: 18.00    Types: Cigarettes   Smokeless tobacco: Never  Vaping Use   Vaping Use: Never used  Substance Use Topics   Alcohol use: No   Drug use: No        Review of Systems  All other systems reviewed and are negative.  Physical Exam Blood pressure 124/68, pulse 80, temperature 98.1 F (36.7 C), temperature source Oral, weight 177 lb (80.3 kg), SpO2 96 %. Last Weight  Most recent update: 08/20/2022  8:32 AM    Weight  80.3 kg (177 lb)             CONSTITUTIONAL: Well developed, and nourished, appropriately responsive and aware without distress.   EYES: Sclera non-icteric.   EARS, NOSE, MOUTH AND THROAT:  The oropharynx is clear. Oral mucosa is pink and moist.    Hearing is intact to voice.  NECK: Trachea is midline, and there is no jugular venous distension.  LYMPH NODES:  Lymph nodes in the neck are not enlarged. RESPIRATORY:  Lungs are clear, and breath sounds are equal bilaterally. Normal respiratory effort without pathologic use of accessory  muscles. CARDIOVASCULAR: Heart is regular in rate and rhythm. GI: The abdomen is soft, nontender, and nondistended.  I proceeded with removal of the stomal bridge.  There appears to be good colostomy patency. MUSCULOSKELETAL:  Symmetrical muscle tone appreciated in all four extremities.    SKIN: Skin turgor is normal. No pathologic skin lesions appreciated.  NEUROLOGIC:  Motor and sensation appear grossly normal.  Cranial nerves are grossly without defect. PSYCH:  Alert and oriented to person, place and time. Affect is appropriate for situation.  Data Reviewed I have personally reviewed what is currently available of the patient's imaging, recent labs and medical records.   Labs:     Latest Ref Rng & Units 07/31/2022    4:26 AM 07/30/2022    4:28 AM 07/26/2022    5:56 PM  CBC  WBC 4.0 - 10.5 K/uL 8.5  8.1  11.9   Hemoglobin 13.0 - 17.0 g/dL 8.2  7.9  11.5   Hematocrit 39.0 - 52.0 % 26.3  25.4  37.2   Platelets 150 - 400 K/uL 185  164  311       Latest Ref Rng & Units 07/31/2022    4:26 AM 07/30/2022    4:28 AM 07/26/2022    5:56 PM  CMP  Glucose 70 - 99 mg/dL 97  112  107   BUN 8 - 23 mg/dL 14  16  24    Creatinine 0.61 - 1.24 mg/dL 0.79  0.85  1.23   Sodium 135 - 145 mmol/L 141  136  137   Potassium 3.5 - 5.1 mmol/L 3.5  3.5  5.2   Chloride 98 - 111 mmol/L 104  103  104   CO2 22 - 32 mmol/L 29  28  21    Calcium 8.9 - 10.3 mg/dL 7.7  7.4  9.4   Total Protein 6.5 - 8.1 g/dL   7.3   Total Bilirubin 0.3 - 1.2 mg/dL   1.4   Alkaline Phos 38 - 126 U/L   82   AST 15 - 41 U/L   36   ALT 0 - 44 U/L   19    SURGICAL PATHOLOGY  CASE: [id number]  PATIENT: [name] [name]  Surgical Pathology Report   Specimen Submitted:  A. Rectum mass; cbx  B. Rectum, abnormal mucosa; cbx   Clinical History: Colonic obstruction.  Rectal mass   DIAGNOSIS:  A. RECTUM MASS; COLD BIOPSY:  - INVASIVE COLORECTAL ADENOCARCINOMA, MODERATELY DIFFERENTIATED.   B.  RECTUM ABNORMAL MUCOSA; COLD BIOPSY:  -  MILD ACUTE PROCTITIS WITH FOCAL SUPERFICIAL ISCHEMIC CHANGE.  - NEGATIVE FOR DYSPLASIA AND MALIGNANCY.   Comment:  There is for sufficient material in part  A to perform MMR testing if  needed   GROSS DESCRIPTION:  A. Labeled: cbx rectal mass   Imaging:  Within last 24 hrs: No results found.  Assessment     Patient Active Problem List   Diagnosis Date Noted   Irritant contact dermatitis associated with fecal stoma    Colostomy complication ([place])    Rectal adenocarcinoma ([place]) 08/07/2022   Stricture of rectosigmoid junction on CT. 07/26/2022   Diabetes mellitus without complication (HCC)    Partial Large bowel obstruction ([place])    Back pain 12/04/2014   Hyperlipidemia 04/04/2010   TOBACCO ABUSE 04/04/2010   Essential hypertension 04/04/2010   Coronary artery disease 04/04/2010    Plan    Port-A-Cath placement, will likely pursue right subclavian site.  Risks of central venous line/Port-A-Cath placement discussed in detail.  These risks include but are not limited to anesthesia, bleeding, infection, thrombosis, venous/vascular injury, possible pneumothorax, cardiac arrhythmia etc.  Believe the patient understands that these risks are not all-inclusive and that catheter placement may require tunneling to other potential sites.  Questions answered to what I believe is the patient's satisfaction.  No guarantees were ever expressed or implied.   Face-to-face time spent with the patient and accompanying care providers(if present) was 15 minutes, with more than 50% of the time spent counseling, educating, and coordinating care of the patient.    These notes generated with voice recognition software. I apologize for typographical errors.  Ronny Bacon M.D., FACS 08/20/2022, 9:30 AM

## 2022-08-19 NOTE — Progress Notes (Unsigned)
Patient ID: [name] [name], male   DOB: 08-13-1946, 76 y.o.   MRN: [id number]  Chief Complaint: Follow-up for loop colostomy, need for Port-A-Cath to initiate chemotherapy.  History of Present Illness [name] [name] is a 76 y.o. male with essentially no complaints considering his loop colostomy status.  Pain appears to be well controlled.  Denies fevers chills or abdominal pain.  Denies nausea, vomiting.  Reports minimal rectal bleeding. No prior right upper extremity injury/collarbone fracture, or prior deep vascular intervention.  Past Medical History Past Medical History:  Diagnosis Date   Bradycardia    Coronary artery disease    Diabetes mellitus without complication ([place])    Hyperlipidemia    Hypertension    Nephrolithiasis       Past Surgical History:  Procedure Laterality Date   CATARACT EXTRACTION     FLEXIBLE SIGMOIDOSCOPY N/A 07/27/2022   Procedure: FLEXIBLE SIGMOIDOSCOPY;  Surgeon: Lesly Rubenstein, MD;  Location: [facility] ENDOSCOPY;  Service: Endoscopy;  Laterality: N/A;   KIDNEY STONE SURGERY     NECK SURGERY  Feb 2013   nephrolithiasis     stent (other)     TRANSVERSE LOOP COLOSTOMY N/A 07/28/2022   Procedure: TRANSVERSE LOOP COLOSTOMY;  Surgeon: Ronny Bacon, MD;  Location: [facility] ORS;  Service: General;  Laterality: N/A;    No Known Allergies  Current Outpatient Medications  Medication Sig Dispense Refill   aspirin 81 MG EC tablet Take 81 mg by mouth daily.       atorvastatin (LIPITOR) 40 MG tablet Take 1 tablet (40 mg total) by mouth daily. 90 tablet 3   cyanocobalamin (VITAMIN B12) 500 MCG tablet Take 500 mcg by mouth daily.     famotidine (PEPCID) 40 MG tablet Take 1 tablet (40 mg total) by mouth daily. 90 tablet 3   isosorbide mononitrate (IMDUR) 30 MG 24 hr tablet Take 1 tablet (30 mg total) by mouth daily. 90 tablet 3   lidocaine-prilocaine (EMLA) cream Apply to affected area once 30 g 3   nitroGLYCERIN (NITROSTAT) 0.4 MG SL tablet Place 1  tablet (0.4 mg total) under the tongue every 5 (five) minutes as needed for chest pain. 25 tablet 6   ondansetron (ZOFRAN) 8 MG tablet Take 1 tablet (8 mg total) by mouth every 8 (eight) hours as needed for nausea or vomiting. 60 tablet 2   pioglitazone (ACTOS) 15 MG tablet Take 15 mg by mouth daily.     prochlorperazine (COMPAZINE) 10 MG tablet Take 1 tablet (10 mg total) by mouth every 6 (six) hours as needed for nausea or vomiting. 60 tablet 2   glimepiride (AMARYL) 4 MG tablet Take 4 mg by mouth daily with breakfast.     metFORMIN (GLUCOPHAGE) 1000 MG tablet Take 1,000 mg by mouth 2 (two) times daily with a meal.      No current facility-administered medications for this visit.    Family History Family History  Problem Relation Age of Onset   Heart disease Father       Social History Social History   Tobacco Use   Smoking status: Every Day    Packs/day: 0.50    Years: 36.00    Total pack years: 18.00    Types: Cigarettes   Smokeless tobacco: Never  Vaping Use   Vaping Use: Never used  Substance Use Topics   Alcohol use: No   Drug use: No        Review of Systems  All other systems reviewed and are negative.  Physical Exam Blood pressure 124/68, pulse 80, temperature 98.1 F (36.7 C), temperature source Oral, weight 177 lb (80.3 kg), SpO2 96 %. Last Weight  Most recent update: 08/20/2022  8:32 AM    Weight  80.3 kg (177 lb)             CONSTITUTIONAL: Well developed, and nourished, appropriately responsive and aware without distress.   EYES: Sclera non-icteric.   EARS, NOSE, MOUTH AND THROAT:  The oropharynx is clear. Oral mucosa is pink and moist.    Hearing is intact to voice.  NECK: Trachea is midline, and there is no jugular venous distension.  LYMPH NODES:  Lymph nodes in the neck are not enlarged. RESPIRATORY:  Lungs are clear, and breath sounds are equal bilaterally. Normal respiratory effort without pathologic use of accessory  muscles. CARDIOVASCULAR: Heart is regular in rate and rhythm. GI: The abdomen is soft, nontender, and nondistended.  I proceeded with removal of the stomal bridge.  There appears to be good colostomy patency. MUSCULOSKELETAL:  Symmetrical muscle tone appreciated in all four extremities.    SKIN: Skin turgor is normal. No pathologic skin lesions appreciated.  NEUROLOGIC:  Motor and sensation appear grossly normal.  Cranial nerves are grossly without defect. PSYCH:  Alert and oriented to person, place and time. Affect is appropriate for situation.  Data Reviewed I have personally reviewed what is currently available of the patient's imaging, recent labs and medical records.   Labs:     Latest Ref Rng & Units 07/31/2022    4:26 AM 07/30/2022    4:28 AM 07/26/2022    5:56 PM  CBC  WBC 4.0 - 10.5 K/uL 8.5  8.1  11.9   Hemoglobin 13.0 - 17.0 g/dL 8.2  7.9  11.5   Hematocrit 39.0 - 52.0 % 26.3  25.4  37.2   Platelets 150 - 400 K/uL 185  164  311       Latest Ref Rng & Units 07/31/2022    4:26 AM 07/30/2022    4:28 AM 07/26/2022    5:56 PM  CMP  Glucose 70 - 99 mg/dL 97  112  107   BUN 8 - 23 mg/dL 14  16  24    Creatinine 0.61 - 1.24 mg/dL 0.79  0.85  1.23   Sodium 135 - 145 mmol/L 141  136  137   Potassium 3.5 - 5.1 mmol/L 3.5  3.5  5.2   Chloride 98 - 111 mmol/L 104  103  104   CO2 22 - 32 mmol/L 29  28  21    Calcium 8.9 - 10.3 mg/dL 7.7  7.4  9.4   Total Protein 6.5 - 8.1 g/dL   7.3   Total Bilirubin 0.3 - 1.2 mg/dL   1.4   Alkaline Phos 38 - 126 U/L   82   AST 15 - 41 U/L   36   ALT 0 - 44 U/L   19    SURGICAL PATHOLOGY  CASE: [id number]  PATIENT: [name] [name]  Surgical Pathology Report   Specimen Submitted:  A. Rectum mass; cbx  B. Rectum, abnormal mucosa; cbx   Clinical History: Colonic obstruction.  Rectal mass   DIAGNOSIS:  A. RECTUM MASS; COLD BIOPSY:  - INVASIVE COLORECTAL ADENOCARCINOMA, MODERATELY DIFFERENTIATED.   B.  RECTUM ABNORMAL MUCOSA; COLD BIOPSY:  -  MILD ACUTE PROCTITIS WITH FOCAL SUPERFICIAL ISCHEMIC CHANGE.  - NEGATIVE FOR DYSPLASIA AND MALIGNANCY.   Comment:  There is for sufficient material in part  A to perform MMR testing if  needed   GROSS DESCRIPTION:  A. Labeled: cbx rectal mass   Imaging:  Within last 24 hrs: No results found.  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 07/26/2022   Diabetes mellitus without complication (HCC)    Partial Large bowel obstruction ([place])    Back pain 12/04/2014   Hyperlipidemia 04/04/2010   TOBACCO ABUSE 04/04/2010   Essential hypertension 04/04/2010   Coronary artery disease 04/04/2010    Plan    Port-A-Cath placement, will likely pursue right subclavian site.  Risks of central venous line/Port-A-Cath placement discussed in detail.  These risks include but are not limited to anesthesia, bleeding, infection, thrombosis, venous/vascular injury, possible pneumothorax, cardiac arrhythmia etc.  Believe the patient understands that these risks are not all-inclusive and that catheter placement may require tunneling to other potential sites.  Questions answered to what I believe is the patient's satisfaction.  No guarantees were ever expressed or implied.   Face-to-face time spent with the patient and accompanying care providers(if present) was 15 minutes, with more than 50% of the time spent counseling, educating, and coordinating care of the patient.    These notes generated with voice recognition software. I apologize for typographical errors.  Ronny Bacon M.D., FACS 08/20/2022, 9:30 AM

## 2022-08-20 ENCOUNTER — Ambulatory Visit: Payer: Self-pay | Admitting: Surgery

## 2022-08-20 ENCOUNTER — Encounter: Payer: Self-pay | Admitting: Oncology

## 2022-08-20 ENCOUNTER — Telehealth: Payer: Self-pay | Admitting: Surgery

## 2022-08-20 ENCOUNTER — Ambulatory Visit (INDEPENDENT_AMBULATORY_CARE_PROVIDER_SITE_OTHER): Payer: Medicare HMO | Admitting: Surgery

## 2022-08-20 ENCOUNTER — Encounter: Payer: Self-pay | Admitting: Surgery

## 2022-08-20 VITALS — BP 124/68 | HR 80 | Temp 98.1°F | Wt 177.0 lb

## 2022-08-20 DIAGNOSIS — C20 Malignant neoplasm of rectum: Secondary | ICD-10-CM

## 2022-08-20 NOTE — Telephone Encounter (Signed)
Patient has been advised of Pre-Admission date/time, and Surgery date at [facility].  Surgery Date: 08/24/22 Preadmission Testing Date: 08/21/22 (phone 8a-1p)  Patient has been made aware to call [phone], between 1-3:00pm the day before surgery, to find out what time to arrive for surgery.

## 2022-08-20 NOTE — Patient Instructions (Signed)
Our surgery scheduler Pamala Hurry will call you within 24-48 hours to get you scheduled. If you have not heard from her after 48 hours, please call our office. Have the blue sheet available when she calls to write down important information.   If you have any concerns or questions, please feel free to call our office.   Implanted Port Insertion Implanted port insertion is a procedure to put in a port and catheter. The port is a device with an injectable disc that can be accessed by your health care provider. The port is connected to a vein in the chest or neck by a small, thin tube (catheter). There are different types of ports. The implanted port may be used as a long-term IV access for: Medicines, such as chemotherapy. Fluids. Liquid nutrition, such as total parenteral nutrition (TPN). When you have a port, your health care provider can choose to use the port instead of veins in your arms for these procedures. Tell a health care provider about: Any allergies you have. All medicines you are taking, especially blood thinners, as well as any vitamins, herbs, eye drops, creams, over-the-counter medicines, and steroids. Any problems you or family members have had with anesthetic medicines. Any bleeding problems you have. Any surgeries you have had. Any medical conditions you have or have had, including diabetes or kidney problems. Whether you are pregnant or may be pregnant. What are the risks? Generally, this is a safe procedure. However, problems may occur, including: Allergic reactions to medicines or dyes. Damage to other structures or organs. Infection. Damage to the blood vessel, bruising, or bleeding at the puncture site. Blood clot. Breakdown of the skin over the port. A collection of air in the chest that can cause one of the lungs to collapse (pneumothorax). This is rare. What happens before the procedure? When to stop eating and drinking Follow instructions from your health care  provider about what you may eat and drink before your procedure. These may include: 8 hours before your procedure Stop eating most foods. Do not eat meat, fried foods, or fatty foods. Eat only light foods, such as toast or crackers. All liquids are okay except energy drinks and alcohol. 6 hours before your procedure Stop eating. Drink only clear liquids, such as water, clear fruit juice, black coffee, plain tea, and sports drinks. Do not drink energy drinks or alcohol. 2 hours before your procedure Stop drinking all liquids. You may be allowed to take medicines with small sips of water. If you do not follow your health care provider's instructions, your procedure may be delayed or canceled. Medicines Ask your health care provider about: Changing or stopping your regular medicines. This is especially important if you are taking diabetes medicines or blood thinners. Taking medicines such as aspirin and ibuprofen. These medicines can thin your blood. Do not take these medicines unless your health care provider tells you to take them. Taking over-the-counter medicines, vitamins, herbs, and supplements. General instructions If you will be going home right after the procedure, plan to have a responsible adult: Take you home from the hospital or clinic. You will not be allowed to drive. Care for you for the time you are told. You may have blood tests. Do not use any products that contain nicotine or tobacco for at least 4 weeks before the procedure. These products include cigarettes, chewing tobacco, and vaping devices, such as e-cigarettes. If you need help quitting, ask your health care provider. Ask your health care provider what steps  will be taken to help prevent infection. These may include: Removing hair at the surgery site. Washing skin with a germ-killing soap. Taking antibiotic medicine. What happens during the procedure?  An IV will be inserted into one of your veins. You will be  given one or more of the following: A medicine to help you relax (sedative). A medicine to numb the area (local anesthetic). Two small incisions will be made to insert the port. One smaller incision will be made in your neck to get access to the vein where the catheter will lie. The other incision will be made in the upper chest. This is where the port will lie. The procedure may be done using continuous X-ray (fluoroscopy) or other imaging tools for guidance. The port and catheter will be placed. There may be a small, raised area where the port is placed. The port will be flushed with a saline solution, which is made of salt and water, and blood will be drawn to make sure that the port is working correctly. The incisions will be closed. Bandages (dressings) may be placed over the incisions. The procedure may vary among health care providers and hospitals. What happens after the procedure? Your blood pressure, heart rate, breathing rate, and blood oxygen level will be monitored until you leave the hospital or clinic. If you were given a sedative during the procedure, it can affect you for several hours. Do not drive or operate machinery until your health care provider says that it is safe. You will be given a manufacturer's information card for the type of port that you have. Keep this with you. Your port will need to be flushed and checked as told by your health care provider, usually every few weeks. A chest X-ray will be done to: Check the placement of the port. Make sure there is no injury to your lung. Summary Implanted port insertion is a procedure to put in a port and catheter. The implanted port is used as a long-term IV access. The port will need to be flushed and checked as told by your health care provider, usually every few weeks. Keep your manufacturer's information card with you at all times. This information is not intended to replace advice given to you by your health care  provider. Make sure you discuss any questions you have with your health care provider. Document Revised: 05/06/2021 Document Reviewed: 05/06/2021 Elsevier Patient Education  [place].

## 2022-08-21 ENCOUNTER — Other Ambulatory Visit: Payer: Self-pay

## 2022-08-21 ENCOUNTER — Encounter
Admission: RE | Admit: 2022-08-21 | Discharge: 2022-08-21 | Disposition: A | Discharge status: Home / Self Care | Payer: Medicare HMO | Source: Ambulatory Visit | Attending: Surgery | Admitting: Surgery

## 2022-08-21 DIAGNOSIS — Z01812 Encounter for preprocedural laboratory examination: Secondary | ICD-10-CM

## 2022-08-21 HISTORY — DX: Gastro-esophageal reflux disease without esophagitis: K21.9

## 2022-08-21 HISTORY — DX: Anemia, unspecified: D64.9

## 2022-08-21 HISTORY — DX: Depression, unspecified: F32.A

## 2022-08-21 HISTORY — DX: Personal history of urinary calculi: Z87.442

## 2022-08-21 HISTORY — DX: Malignant (primary) neoplasm, unspecified: C80.1

## 2022-08-21 HISTORY — DX: Angina pectoris, unspecified: I20.9

## 2022-08-21 NOTE — Patient Instructions (Addendum)
Your procedure is scheduled on: 08/24/22 - Monday Report to the Registration Desk on the 1st floor of the [facility]. To find out your arrival time, please call [phone] between 1PM - 3PM on: 08/21/22 - Friday If your arrival time is 6:00 am, do not arrive prior to that time as the [place] entrance doors do not open until 6:00 am.  REMEMBER: Instructions that are not followed completely may result in serious medical risk, up to and including death; or upon the discretion of your surgeon and anesthesiologist your surgery may need to be rescheduled.  Do not eat food or drink any liquids after midnight the night before surgery.  No gum chewing, lozengers or hard candies.  TAKE THESE MEDICATIONS THE MORNING OF SURGERY WITH A SIP OF WATER:  - famotidine (PEPCID)  - isosorbide mononitrate (IMDUR)   HOLD metFORMIN (GLUCOPHAGE) beginning 08/22/22.   One week prior to surgery: Stop Anti-inflammatories (NSAIDS) such as Advil, Aleve, Ibuprofen, Motrin, Naproxen, Naprosyn and Aspirin based products such as Excedrin, Goodys Powder, BC Powder.  Stop ANY OVER THE COUNTER supplements until after surgery.  You may however, continue to take Tylenol if needed for pain up until the day of surgery.  No Alcohol for 24 hours before or after surgery.  No Smoking including e-cigarettes for 24 hours prior to surgery.  No chewable tobacco products for at least 6 hours prior to surgery.  No nicotine patches on the day of surgery.  Do not use any "recreational" drugs for at least a week prior to your surgery.  Please be advised that the combination of cocaine and anesthesia may have negative outcomes, up to and including death. If you test positive for cocaine, your surgery will be cancelled.  On the morning of surgery brush your teeth with toothpaste and water, you may rinse your mouth with mouthwash if you wish. Do not swallow any toothpaste or mouthwash.  Do not wear jewelry, make-up,  hairpins, clips or nail polish.  Do not wear lotions, powders, or perfumes.   Do not shave body from the neck down 48 hours prior to surgery just in case you cut yourself which could leave a site for infection.  Also, freshly shaved skin may become irritated if using the CHG soap.  Contact lenses, hearing aids and dentures may not be worn into surgery.  Do not bring valuables to the hospital. [facility] is not responsible for any missing/lost belongings or valuables.   Notify your doctor if there is any change in your medical condition (cold, fever, infection).  Wear comfortable clothing (specific to your surgery type) to the hospital.  After surgery, you can help prevent lung complications by doing breathing exercises.  Take deep breaths and cough every 1-2 hours. Your doctor may order a device called an Incentive Spirometer to help you take deep breaths. When coughing or sneezing, hold a pillow firmly against your incision with both hands. This is called "splinting." Doing this helps protect your incision. It also decreases belly discomfort.  If you are being admitted to the hospital overnight, leave your suitcase in the car. After surgery it may be brought to your room.  If you are being discharged the day of surgery, you will not be allowed to drive home. You will need a responsible adult (18 years or older) to drive you home and stay with you that night.   If you are taking public transportation, you will need to have a responsible adult (18 years or  older) with you. Please confirm with your physician that it is acceptable to use public transportation.   Please call the [facility] at [phone] if you have any questions about these instructions.  Surgery Visitation Policy:  Patients undergoing a surgery or procedure may have two family members or support persons with them as long as the person is not COVID-19 positive or experiencing its symptoms.    Inpatient Visitation:    Visiting hours are 7 a.m. to 8 p.m. Up to four visitors are allowed at one time in a patient room, including children. The visitors may rotate out with other people during the day. One designated support person (adult) may remain overnight.

## 2022-08-21 NOTE — Progress Notes (Signed)
[place]  [phone] [phone]  ID: [name] [name] OB: 06/17/1946  MR#: [id number]  DGL#:[id number]  Patient Care Team: [facility] as PCP - General [name], [name] November, MD as PCP - Cardiology (Cardiology) [name], MD as Consulting Physician (Cardiology) [name], RN as Oncology Nurse Navigator  CHIEF COMPLAINT: Stage IIa rectal adenocarcinoma.  INTERVAL HISTORY: Patient returns to clinic today for further evaluation and initiation of cycle 1 of 8 of neoadjuvant FOLFOX.  He continues to feel well and is asymptomatic.  He has no neurologic complaints.  He denies any recent fevers or illnesses.  He has no chest pain, shortness of breath, cough, or hemoptysis.  He denies any nausea, vomiting, constipation, or diarrhea.  He does not report any melena or hematochezia.  He has no urinary complaints.  Patient is no specific complaints today.  REVIEW OF SYSTEMS:   Review of Systems  Constitutional: Negative.  Negative for fever, malaise/fatigue and weight loss.  Respiratory: Negative.  Negative for cough, hemoptysis and shortness of breath.   Cardiovascular: Negative.  Negative for chest pain and leg swelling.  Gastrointestinal: Negative.  Negative for abdominal pain.  Genitourinary: Negative.  Negative for dysuria.  Musculoskeletal: Negative.  Negative for back pain.  Skin: Negative.  Negative for rash.  Neurological: Negative.  Negative for dizziness, focal weakness, weakness and headaches.  Psychiatric/Behavioral: Negative.  The patient is not nervous/anxious.     As per HPI. Otherwise, a complete review of systems is negative.  PAST MEDICAL HISTORY: Past Medical History:  Diagnosis Date   Anemia    Anginal pain (HCC)    Bradycardia    Cancer (HCC)    Coronary artery disease    x 1 stent   Depression    Diabetes mellitus without complication (HCC)    GERD (gastroesophageal reflux disease)     History of kidney stones    Hyperlipidemia    Hypertension    Nephrolithiasis     PAST SURGICAL HISTORY: Past Surgical History:  Procedure Laterality Date   CARDIAC CATHETERIZATION     CATARACT EXTRACTION     CORONARY ANGIOPLASTY     FLEXIBLE SIGMOIDOSCOPY N/A 07/27/2022   Procedure: FLEXIBLE SIGMOIDOSCOPY;  Surgeon: Lesly Rubenstein, MD;  Location: [facility] END[facility];  Service: Endoscopy;  Laterality: N/A;   KIDNEY STONE SURGERY     NECK SURGERY  12/18/2011   nephrolithiasis     PORTACATH PLACEMENT Right 08/24/2022   Procedure: INSERTION PORT-A-CATH;  Surgeon: Ronny Bacon, MD;  Location: [facility] ORS;  Service: General;  Laterality: Right;   stent (other)     TRANSVERSE LOOP COLOSTOMY N/A 07/28/2022   Procedure: TRANSVERSE LOOP COLOSTOMY;  Surgeon: Ronny Bacon, MD;  Location: [facility] ORS;  Service: General;  Laterality: N/A;    FAMILY HISTORY: Family History  Problem Relation Age of Onset   Heart disease Father     ADVANCED DIRECTIVES (Y/N):  N  HEALTH MAINTENANCE: Social History   Tobacco Use   Smoking status: Every Day    Packs/day: 0.50    Years: 36.00    Total pack years: 18.00    Types: Cigarettes   Smokeless tobacco: Never  Vaping Use   Vaping Use: Never used  Substance Use Topics   Alcohol use: No   Drug use: No     Colonoscopy:  PAP:  Bone density:  Lipid panel:  No Known Allergies  Current Outpatient Medications  Medication Sig Dispense Refill   aspirin 81 MG EC  tablet Take 81 mg by mouth daily.       atorvastatin (LIPITOR) 40 MG tablet Take 1 tablet (40 mg total) by mouth daily. 90 tablet 3   cyanocobalamin (VITAMIN B12) 500 MCG tablet Take 500 mcg by mouth daily.     famotidine (PEPCID) 40 MG tablet Take 1 tablet (40 mg total) by mouth daily. 90 tablet 3   glimepiride (AMARYL) 4 MG tablet Take 4 mg by mouth daily with breakfast.     glimepiride (AMARYL) 4 MG tablet Take 4 mg by mouth daily with breakfast.     HYDROcodone-acetaminophen  (NORCO/VICODIN) 5-325 MG tablet Take 1 tablet by mouth every 6 (six) hours as needed for moderate pain.     ibuprofen (ADVIL) 800 MG tablet Take 1 tablet (800 mg total) by mouth every 8 (eight) hours as needed. 30 tablet 0   isosorbide mononitrate (IMDUR) 30 MG 24 hr tablet Take 1 tablet (30 mg total) by mouth daily. 90 tablet 3   lidocaine-prilocaine (EMLA) cream Apply to affected area once 30 g 3   metFORMIN (GLUCOPHAGE) 1000 MG tablet Take 1,000 mg by mouth 2 (two) times daily with a meal.     nitroGLYCERIN (NITROSTAT) 0.4 MG SL tablet Place 1 tablet (0.4 mg total) under the tongue every 5 (five) minutes as needed for chest pain. 25 tablet 6   ondansetron (ZOFRAN) 8 MG tablet Take 1 tablet (8 mg total) by mouth every 8 (eight) hours as needed for nausea or vomiting. 60 tablet 2   pioglitazone (ACTOS) 15 MG tablet Take 15 mg by mouth daily.     prochlorperazine (COMPAZINE) 10 MG tablet Take 1 tablet (10 mg total) by mouth every 6 (six) hours as needed for nausea or vomiting. 60 tablet 2   clopidogrel (PLAVIX) 75 MG tablet Take 75 mg by mouth daily. (Patient not taking: Reported on 08/25/2022)     metFORMIN (GLUCOPHAGE) 1000 MG tablet Take 1,000 mg by mouth 2 (two) times daily with a meal.      No current facility-administered medications for this visit.   Facility-Administered Medications Ordered in Other Visits  Medication Dose Route Frequency Provider Last Rate Last Admin   fluorouracil (ADRUCIL) 4,850 mg in sodium chloride 0.9 % 53 mL chemo infusion  2,400 mg/m2 (Treatment Plan Recorded) Intravenous 1 day or 1 dose Lloyd Huger, MD   Infusion Verify at 08/25/22 1326    OBJECTIVE: Vitals:   08/25/22 0835  BP: 118/63  Pulse: 61  Resp: 16  Temp: (!) 96.2 F (35.7 C)  SpO2: 100%     Body mass index is 24.41 kg/m.    ECOG FS:0 - Asymptomatic  General: Well-developed, well-nourished, no acute distress. Eyes: Pink conjunctiva, anicteric sclera. HEENT: Normocephalic, moist mucous  membranes. Lungs: No audible wheezing or coughing. Heart: Regular rate and rhythm. Abdomen: Soft, nontender, no obvious distention.  Colostomy bag noted. Musculoskeletal: No edema, cyanosis, or clubbing. Neuro: Alert, answering all questions appropriately. Cranial nerves grossly intact. Skin: No rashes or petechiae noted. Psych: Normal affect.   LAB RESULTS:  Lab Results  Component Value Date   NA 138 08/25/2022   K 4.0 08/25/2022   CL 108 08/25/2022   CO2 24 08/25/2022   GLUCOSE 170 (H) 08/25/2022   BUN 16 08/25/2022   CREATININE 0.83 08/25/2022   CALCIUM 8.5 (L) 08/25/2022   PROT 6.3 (L) 08/25/2022   ALBUMIN 3.5 08/25/2022   AST 21 08/25/2022   ALT 15 08/25/2022   ALKPHOS 69 08/25/2022  BILITOT 0.3 08/25/2022   GFRNONAA >60 08/25/2022   GFRAA >60 12/20/2014    Lab Results  Component Value Date   WBC 7.1 08/25/2022   NEUTROABS 4.9 08/25/2022   HGB 8.0 (L) 08/25/2022   HCT 25.5 (L) 08/25/2022   MCV 81.0 08/25/2022   PLT 237 08/25/2022     STUDIES: DG Chest Port 1 View  Result Date: 08/24/2022 CLINICAL DATA:  Port-A-Cath in place. EXAM: PORTABLE CHEST 1 VIEW COMPARISON:  Chest CT 08/07/2022 FINDINGS: Right chest port with tip in the mid SVC. No pneumothorax. The heart is normal in size with normal mediastinal contours. Aortic atherosclerosis. No focal airspace disease, the ill-defined opacities in the right lower lobe on prior CT are not seen by radiograph. No pleural fluid. Stable osseous structures. IMPRESSION: Right chest port with tip in the mid SVC. No pneumothorax. Electronically Signed   By: Keith Rake M.D.   On: 08/24/2022 15:36   DG C-Arm 1-60 Min-No Report  Result Date: 08/24/2022 Fluoroscopy was utilized by the requesting physician.  No radiographic interpretation.   MR PELVIS WO CM RECTAL CA STAGING  Result Date: 08/10/2022 CLINICAL DATA:  New diagnosis of rectal cancer. Large fungating mass 10 cm from anal verge on sigmoidoscopy. EXAM: MRI  PELVIS WITHOUT CONTRAST TECHNIQUE: Multiplanar multisequence MR imaging of the pelvis was performed. No intravenous contrast was administered. Ultrasound gel was administered per rectum to optimize tumor evaluation. COMPARISON:  CT of 08/07/2022.  Sigmoidoscopy report 07/27/2022 FINDINGS: Evaluation is somewhat challenging secondary to the position at the rectosigmoid junction. TUMOR LOCATION Tumor distance from Anal Verge/Skin Surface:  10.7 cm, image 24/12 Tumor distance to Internal Anal Sphincter: 9.6 cm, image 27/7 TUMOR DESCRIPTION Circumferential Extent: Circumferentiala, including on 11/08 and 27/7 Tumor Length: 2.5 cm, image 19/3 T - CATEGORY Extension through [employer]: Small volume on the left at 3 mm on 10/08-T3b Shortest Distance of any tumor/node from Mesorectal Fascia: 1.9 cm on 10/08 Extramural Vascular Invasion/Tumor Thrombus: No Invasion of Anterior Peritoneal Reflection: No Involvement of Adjacent Organs or Pelvic Sidewall: No Levator Ani Involvement: No N - CATEGORY Mesorectal Lymph Nodes None=N0 Extra-mesorectal Lymphadenopathy: No Other: Trace free pelvic fluid. Small bilateral hydroceles. Normal urinary bladder and prostate. Resolution of upstream bowel obstruction. Proximal sigmoid colonic wall thickening could be due to underdistention but is indeterminate. Scattered colonic diverticula. IMPRESSION: Rectal adenocarcinoma T stage: T3b Rectal adenocarcinoma N stage:  0 Distance from tumor to the internal anal sphincter is 9.6 cm. Electronically Signed   By: Abigail Miyamoto M.D.   On: 08/10/2022 08:30   CT Chest W Contrast  Result Date: 08/08/2022 CLINICAL DATA:  76 year old male with history of new diagnosis of rectal cancer. Staging examination. * Tracking Code: BO * EXAM: CT CHEST WITH CONTRAST TECHNIQUE: Multidetector CT imaging of the chest was performed during intravenous contrast administration. RADIATION DOSE REDUCTION: This exam was performed according to the departmental  dose-optimization program which includes automated exposure control, adjustment of the mA and/or kV according to patient size and/or use of iterative reconstruction technique. CONTRAST:  37m OMNIPAQUE IOHEXOL 300 MG/ML  SOLN COMPARISON:  Chest CT 12/14/2014. FINDINGS: Cardiovascular: Heart size is normal. There is no significant pericardial fluid, thickening or pericardial calcification. There is aortic atherosclerosis, as well as atherosclerosis of the great vessels of the mediastinum and the coronary arteries, including calcified atherosclerotic plaque in the left main, left anterior descending, left circumflex and right coronary arteries. Mediastinum/Nodes: No pathologically enlarged mediastinal or hilar lymph nodes. Esophagus is unremarkable in  appearance. No axillary lymphadenopathy. Lungs/Pleura: Some patchy areas of very mild ground-glass attenuation, septal thickening and centrilobular ground-glass attenuation micro nodularity are noted in the right lower lobe. No other larger more suspicious appearing pulmonary nodules or masses are noted. No acute consolidative airspace disease. No pleural effusions. Mild diffuse bronchial wall thickening with mild centrilobular and paraseptal emphysema. Upper Abdomen: Aortic atherosclerosis. Left upper quadrant colostomy incompletely imaged. Musculoskeletal: There are no aggressive appearing lytic or blastic lesions noted in the visualized portions of the skeleton. Orthopedic fixation hardware in the lower cervical spine incidentally noted. IMPRESSION: 1. No definitive findings to suggest metastatic disease to the thorax. 2. There are some nonspecific findings in the right lower lobe which likely reflect resolving infection. Repeat noncontrast chest CT should be considered in 12 months to ensure the stability or regression of these findings. 3. Mild diffuse bronchial wall thickening with mild centrilobular and paraseptal emphysema; imaging findings suggestive of  underlying COPD. 4. Aortic atherosclerosis, in addition to left main and three-vessel coronary artery disease. Assessment for potential risk factor modification, dietary therapy or pharmacologic therapy may be warranted, if clinically indicated. Aortic Atherosclerosis (ICD10-I70.0) and Emphysema (ICD10-J43.9). Electronically Signed   By: Vinnie Langton M.D.   On: 08/08/2022 12:32   CT ABDOMEN PELVIS W CONTRAST  Result Date: 07/26/2022 CLINICAL DATA:  Abdominal pain, acute, nonlocalized No stool for 2 weeks EXAM: CT ABDOMEN AND PELVIS WITH CONTRAST TECHNIQUE: Multidetector CT imaging of the abdomen and pelvis was performed using the standard protocol following bolus administration of intravenous contrast. RADIATION DOSE REDUCTION: This exam was performed according to the departmental dose-optimization program which includes automated exposure control, adjustment of the mA and/or kV according to patient size and/or use of iterative reconstruction technique. CONTRAST:  69m OMNIPAQUE IOHEXOL 350 MG/ML SOLN COMPARISON:  09/14/2008 FINDINGS: Lower chest: No acute abnormality Hepatobiliary: No focal hepatic abnormality. Gallbladder unremarkable. Pancreas: No focal abnormality or ductal dilatation. Spleen: No focal abnormality.  Normal size. Adrenals/Urinary Tract: Bilateral renal parapelvic cysts. No follow-up imaging recommended. Scarring in the upper pole of the right kidney. No hydronephrosis. Adrenal glands and urinary bladder unremarkable. Stomach/Bowel: Large stool burden and gaseous distention of the colon to the distal sigmoid colon/rectosigmoid junction. There is caliber change at this level. Cannot exclude colonic stricture or malignancy in this area. Normal appendix. Stomach and small bowel decompressed, unremarkable. Vascular/Lymphatic: Aortic atherosclerosis. No evidence of aneurysm or adenopathy. Reproductive: No visible focal abnormality. Other: No free fluid or free air. Musculoskeletal: No acute  bony abnormality. IMPRESSION: Short-segment area of narrowing within the distal sigmoid near the rectosigmoid junction. Cannot exclude partially obstructing stricture or malignancy. Proximal to this area, the colon is dilated with a large amount of gas and stool throughout the colon. Aortic atherosclerosis. Electronically Signed   By: KRolm BaptiseM.D.   On: 07/26/2022 18:57    ASSESSMENT: Stage IIa rectal adenocarcinoma.  PLAN:    Stage IIa rectal adenocarcinoma: Diagnosis confirmed by imaging and biopsy.  MRI and CT scan results reviewed independently and reported as above confirming stage of disease.  Patient will benefit from neoadjuvant FOLFOX every 2 weeks x 8 cycles followed by concurrent Xeloda and daily XRT.  Finally, patient will benefit from definitive surgical intervention of his residual malignancy.  Unclear if colostomy is reversible.  Patient has now had port placement.  Proceed with cycle 1 of FOLFOX today.  Return to clinic in 2 days for pump removal, 1 week for laboratory work and further evaluation, in 2 weeks for  further evaluation and consideration of cycle 2.     Abdominal pain: Improved with diverting colostomy. Anemia: Hemoglobin has trended down to 8.0, monitor.  Consider transfusion if it continues to trend down.  I spent a total of 30 minutes reviewing chart data, face-to-face evaluation with the patient, counseling and coordination of care as detailed above.   Patient expressed understanding and was in agreement with this plan. He also understands that He can call clinic at any time with any questions, concerns, or complaints.    Cancer Staging  Rectal adenocarcinoma [facility]) Staging form: Colon and Rectum, AJCC 8th Edition - Clinical stage from 08/13/2022: Stage IIA (cT3, cN0, cM0) - Signed by Lloyd Huger, MD on 08/13/2022 Total positive nodes: 0  Lloyd Huger, MD   08/25/2022 5:14 PM

## 2022-08-24 ENCOUNTER — Ambulatory Visit
Admission: RE | Admit: 2022-08-24 | Discharge: 2022-08-24 | Disposition: A | Discharge status: Home / Self Care | Payer: Medicare HMO | Attending: Surgery | Admitting: Surgery

## 2022-08-24 ENCOUNTER — Ambulatory Visit: Payer: Medicare HMO

## 2022-08-24 ENCOUNTER — Encounter
Admission: RE | Disposition: A | Discharge status: Home / Self Care | Payer: Self-pay | Source: Home / Self Care | Attending: Surgery

## 2022-08-24 ENCOUNTER — Other Ambulatory Visit: Payer: Self-pay

## 2022-08-24 ENCOUNTER — Ambulatory Visit: Payer: Medicare HMO | Admitting: Certified Registered"

## 2022-08-24 ENCOUNTER — Encounter: Payer: Self-pay | Admitting: Surgery

## 2022-08-24 ENCOUNTER — Ambulatory Visit: Payer: Medicare HMO | Admitting: Urgent Care

## 2022-08-24 DIAGNOSIS — K219 Gastro-esophageal reflux disease without esophagitis: Secondary | ICD-10-CM | POA: Insufficient documentation

## 2022-08-24 DIAGNOSIS — I2510 Atherosclerotic heart disease of native coronary artery without angina pectoris: Secondary | ICD-10-CM | POA: Insufficient documentation

## 2022-08-24 DIAGNOSIS — Z933 Colostomy status: Secondary | ICD-10-CM | POA: Insufficient documentation

## 2022-08-24 DIAGNOSIS — Z7984 Long term (current) use of oral hypoglycemic drugs: Secondary | ICD-10-CM | POA: Diagnosis not present

## 2022-08-24 DIAGNOSIS — C20 Malignant neoplasm of rectum: Secondary | ICD-10-CM

## 2022-08-24 DIAGNOSIS — J449 Chronic obstructive pulmonary disease, unspecified: Secondary | ICD-10-CM | POA: Diagnosis not present

## 2022-08-24 DIAGNOSIS — E119 Type 2 diabetes mellitus without complications: Secondary | ICD-10-CM | POA: Insufficient documentation

## 2022-08-24 DIAGNOSIS — F17210 Nicotine dependence, cigarettes, uncomplicated: Secondary | ICD-10-CM | POA: Diagnosis not present

## 2022-08-24 DIAGNOSIS — I700 Atherosclerosis of aorta: Secondary | ICD-10-CM | POA: Diagnosis not present

## 2022-08-24 DIAGNOSIS — Z01812 Encounter for preprocedural laboratory examination: Secondary | ICD-10-CM

## 2022-08-24 DIAGNOSIS — Z452 Encounter for adjustment and management of vascular access device: Secondary | ICD-10-CM | POA: Diagnosis not present

## 2022-08-24 DIAGNOSIS — Z8249 Family history of ischemic heart disease and other diseases of the circulatory system: Secondary | ICD-10-CM | POA: Insufficient documentation

## 2022-08-24 DIAGNOSIS — I10 Essential (primary) hypertension: Secondary | ICD-10-CM | POA: Insufficient documentation

## 2022-08-24 DIAGNOSIS — F17209 Nicotine dependence, unspecified, with unspecified nicotine-induced disorders: Secondary | ICD-10-CM | POA: Diagnosis not present

## 2022-08-24 HISTORY — PX: PORTACATH PLACEMENT: SHX2246

## 2022-08-24 LAB — CBC
HCT: 30.3 % — ABNORMAL LOW (ref 39.0–52.0)
Hemoglobin: 9.2 g/dL — ABNORMAL LOW (ref 13.0–17.0)
MCH: 25 pg — ABNORMAL LOW (ref 26.0–34.0)
MCHC: 30.4 g/dL (ref 30.0–36.0)
MCV: 82.3 fL (ref 80.0–100.0)
Platelets: 285 10*3/uL (ref 150–400)
RBC: 3.68 MIL/uL — ABNORMAL LOW (ref 4.22–5.81)
RDW: 15.7 % — ABNORMAL HIGH (ref 11.5–15.5)
WBC: 10.4 10*3/uL (ref 4.0–10.5)
nRBC: 0 % (ref 0.0–0.2)

## 2022-08-24 LAB — GLUCOSE, CAPILLARY
Glucose-Capillary: 132 mg/dL — ABNORMAL HIGH (ref 70–99)
Glucose-Capillary: 125 mg/dL — ABNORMAL HIGH (ref 70–99)

## 2022-08-24 SURGERY — INSERTION, TUNNELED CENTRAL VENOUS DEVICE, WITH PORT
Anesthesia: General | Laterality: Right

## 2022-08-24 MED ORDER — BUPIVACAINE LIPOSOME 1.3 % IJ SUSP: 20.0000 mL | Freq: Once | INTRAMUSCULAR | Status: DC

## 2022-08-24 MED ORDER — ORAL CARE MOUTH RINSE: 15.0000 mL | Freq: Once | OROMUCOSAL | Status: AC

## 2022-08-24 MED ORDER — FENTANYL CITRATE (PF) 100 MCG/2ML IJ SOLN: 25.0000 ug | INTRAMUSCULAR | Status: DC | PRN

## 2022-08-24 MED ORDER — BUPIVACAINE-EPINEPHRINE (PF) 0.25% -1:200000 IJ SOLN
INTRAMUSCULAR | Status: AC
  Filled 2022-08-24: qty 30

## 2022-08-24 MED ORDER — ACETAMINOPHEN 500 MG PO TABS: 1000.0000 mg | ORAL_TABLET | ORAL | Status: AC

## 2022-08-24 MED ORDER — CEFAZOLIN SODIUM-DEXTROSE 2-4 GM/100ML-% IV SOLN
2.0000 g | INTRAVENOUS | Status: AC
  Administered 2022-08-24: 2 g via INTRAVENOUS

## 2022-08-24 MED ORDER — FENTANYL CITRATE (PF) 100 MCG/2ML IJ SOLN
INTRAMUSCULAR | Status: AC
  Filled 2022-08-24: qty 2

## 2022-08-24 MED ORDER — BUPIVACAINE-EPINEPHRINE (PF) 0.25% -1:200000 IJ SOLN
INTRAMUSCULAR | Status: DC | PRN
  Administered 2022-08-24: 8 mL

## 2022-08-24 MED ORDER — CHLORHEXIDINE GLUCONATE 0.12 % MT SOLN
OROMUCOSAL | Status: AC
  Administered 2022-08-24: 15 mL via OROMUCOSAL
  Filled 2022-08-24: qty 15

## 2022-08-24 MED ORDER — OXYCODONE HCL 5 MG/5ML PO SOLN: 5.0000 mg | Freq: Once | ORAL | Status: DC | PRN

## 2022-08-24 MED ORDER — CHLORHEXIDINE GLUCONATE CLOTH 2 % EX PADS: 6.0000 | MEDICATED_PAD | Freq: Once | CUTANEOUS | Status: DC

## 2022-08-24 MED ORDER — PROPOFOL 1000 MG/100ML IV EMUL
INTRAVENOUS | Status: AC
  Filled 2022-08-24: qty 100

## 2022-08-24 MED ORDER — EPHEDRINE SULFATE (PRESSORS) 50 MG/ML IJ SOLN
INTRAMUSCULAR | Status: DC | PRN
  Administered 2022-08-24: 5 mg via INTRAVENOUS
  Administered 2022-08-24: 10 mg via INTRAVENOUS

## 2022-08-24 MED ORDER — CEFAZOLIN SODIUM-DEXTROSE 2-4 GM/100ML-% IV SOLN
INTRAVENOUS | Status: AC
  Filled 2022-08-24: qty 100

## 2022-08-24 MED ORDER — GABAPENTIN 300 MG PO CAPS
ORAL_CAPSULE | ORAL | Status: AC
  Administered 2022-08-24: 300 mg via ORAL
  Filled 2022-08-24: qty 1

## 2022-08-24 MED ORDER — OXYCODONE HCL 5 MG PO TABS: 5.0000 mg | ORAL_TABLET | Freq: Once | ORAL | Status: DC | PRN

## 2022-08-24 MED ORDER — PROPOFOL 500 MG/50ML IV EMUL
INTRAVENOUS | Status: DC | PRN
  Administered 2022-08-24: 100 ug/kg/min via INTRAVENOUS

## 2022-08-24 MED ORDER — CELECOXIB 200 MG PO CAPS: 200.0000 mg | ORAL_CAPSULE | ORAL | Status: AC

## 2022-08-24 MED ORDER — IBUPROFEN 800 MG PO TABS: 800.0000 mg | ORAL_TABLET | Freq: Three times a day (TID) | ORAL | 0 refills | Status: DC | PRN

## 2022-08-24 MED ORDER — HEPARIN 5000 UNITS IN NS 1000 ML (FLUSH)
INTRAMUSCULAR | Status: DC | PRN
  Administered 2022-08-24: 10 mL via INTRAMUSCULAR

## 2022-08-24 MED ORDER — CELECOXIB 200 MG PO CAPS
ORAL_CAPSULE | ORAL | Status: AC
  Administered 2022-08-24: 200 mg via ORAL
  Filled 2022-08-24: qty 1

## 2022-08-24 MED ORDER — CHLORHEXIDINE GLUCONATE 0.12 % MT SOLN: 15.0000 mL | Freq: Once | OROMUCOSAL | Status: AC

## 2022-08-24 MED ORDER — SODIUM CHLORIDE 0.9 % IV SOLN: INTRAVENOUS | Status: DC

## 2022-08-24 MED ORDER — GABAPENTIN 300 MG PO CAPS: 300.0000 mg | ORAL_CAPSULE | ORAL | Status: AC

## 2022-08-24 MED ORDER — FENTANYL CITRATE (PF) 100 MCG/2ML IJ SOLN
INTRAMUSCULAR | Status: DC | PRN
  Administered 2022-08-24 (×2): 25 ug via INTRAVENOUS

## 2022-08-24 MED ORDER — ACETAMINOPHEN 500 MG PO TABS
ORAL_TABLET | ORAL | Status: AC
  Administered 2022-08-24: 1000 mg via ORAL
  Filled 2022-08-24: qty 2

## 2022-08-24 MED ORDER — HEPARIN SODIUM (PORCINE) 5000 UNIT/ML IJ SOLN
INTRAMUSCULAR | Status: AC
  Filled 2022-08-24: qty 2

## 2022-08-24 MED ORDER — PROPOFOL 10 MG/ML IV BOLUS
INTRAVENOUS | Status: DC | PRN
  Administered 2022-08-24: 50 mg via INTRAVENOUS

## 2022-08-24 MED ORDER — SODIUM CHLORIDE (PF) 0.9 % IJ SOLN
INTRAMUSCULAR | Status: AC
  Filled 2022-08-24: qty 50

## 2022-08-24 MED FILL — Dexamethasone Sodium Phosphate Inj 100 MG/10ML: INTRAMUSCULAR | Qty: 1 | Status: AC

## 2022-08-24 SURGICAL SUPPLY — 34 items
ADH SKN CLS APL DERMABOND .7 (GAUZE/BANDAGES/DRESSINGS) ×1
APL PRP STRL LF DISP 70% ISPRP (MISCELLANEOUS) ×1
BAG DECANTER FOR FLEXI CONT (MISCELLANEOUS) ×1 IMPLANT
BLADE SURG 15 STRL LF DISP TIS (BLADE) ×1 IMPLANT
BLADE SURG 15 STRL SS (BLADE) ×1
BOOT SUTURE AID YELLOW STND (SUTURE) ×1 IMPLANT
CHLORAPREP W/TINT 26 (MISCELLANEOUS) ×1 IMPLANT
COVER LIGHT HANDLE STERIS (MISCELLANEOUS) ×2 IMPLANT
DERMABOND ADVANCED .7 DNX12 (GAUZE/BANDAGES/DRESSINGS) ×1 IMPLANT
DRAPE C-ARM XRAY 36X54 (DRAPES) ×1 IMPLANT
ELECT CAUTERY BLADE 6.4 (BLADE) ×1 IMPLANT
ELECT REM PT RETURN 9FT ADLT (ELECTROSURGICAL) ×1
ELECTRODE REM PT RTRN 9FT ADLT (ELECTROSURGICAL) ×1 IMPLANT
GAUZE 4X4 16PLY ~~LOC~~+RFID DBL (SPONGE) ×1 IMPLANT
GLOVE ORTHO TXT STRL SZ7.5 (GLOVE) ×2 IMPLANT
GOWN STRL REUS W/ TWL LRG LVL3 (GOWN DISPOSABLE) ×1 IMPLANT
GOWN STRL REUS W/ TWL XL LVL3 (GOWN DISPOSABLE) ×1 IMPLANT
GOWN STRL REUS W/TWL LRG LVL3 (GOWN DISPOSABLE) ×3
GOWN STRL REUS W/TWL XL LVL3 (GOWN DISPOSABLE) ×1
IV NS 500ML (IV SOLUTION) ×1
IV NS 500ML BAXH (IV SOLUTION) ×1 IMPLANT
KIT PORT POWER 8FR ISP CVUE (Port) ×1 IMPLANT
KIT TURNOVER KIT A (KITS) ×1 IMPLANT
MANIFOLD NEPTUNE II (INSTRUMENTS) ×1 IMPLANT
NEEDLE HYPO 22GX1.5 SAFETY (NEEDLE) ×1 IMPLANT
PACK PORT-A-CATH (MISCELLANEOUS) ×1 IMPLANT
SUT MNCRL AB 4-0 PS2 18 (SUTURE) ×1 IMPLANT
SUT VIC AB 3-0 SH 27 (SUTURE) ×1
SUT VIC AB 3-0 SH 27X BRD (SUTURE) ×1 IMPLANT
SYR 10ML LL (SYRINGE) ×1 IMPLANT
SYR 3ML LL SCALE MARK (SYRINGE) ×1 IMPLANT
SYR 5ML LL (SYRINGE) ×1 IMPLANT
TRAP FLUID SMOKE EVACUATOR (MISCELLANEOUS) ×1 IMPLANT
WATER STERILE IRR 500ML POUR (IV SOLUTION) ×1 IMPLANT

## 2022-08-24 NOTE — Interval H&P Note (Signed)
History and Physical Interval Note:  08/24/2022 1:19 PM  [name] [name]  has presented today for surgery, with the diagnosis of Rectal adenocarcinoma.  The various methods of treatment have been discussed with the patient and family. After consideration of risks, benefits and other options for treatment, the patient has consented to  Procedure(s): INSERTION PORT-A-CATH (Right) as a surgical intervention.  The patient's history has been reviewed, patient examined, no change in status, stable for surgery.  I have reviewed the patient's chart and labs.  Questions were answered to the patient's satisfaction.   Right side is marked.   Ronny Bacon

## 2022-08-24 NOTE — Discharge Instructions (Signed)
AMBULATORY SURGERY  ?DISCHARGE INSTRUCTIONS ? ? ?The drugs that you were given will stay in your system until tomorrow so for the next 24 hours you should not: ? ?Drive an automobile ?Make any legal decisions ?Drink any alcoholic beverage ? ? ?You may resume regular meals tomorrow.  Today it is better to start with liquids and gradually work up to solid foods. ? ?You may eat anything you prefer, but it is better to start with liquids, then soup and crackers, and gradually work up to solid foods. ? ? ?Please notify your doctor immediately if you have any unusual bleeding, trouble breathing, redness and pain at the surgery site, drainage, fever, or pain not relieved by medication. ? ? ? ?Additional Instructions: ? ? ? ?Please contact your physician with any problems or Same Day Surgery at [phone], Monday through Friday 6 am to 4 pm, or [place] [facility] [place] Main number at [phone].  ?

## 2022-08-24 NOTE — Anesthesia Preprocedure Evaluation (Signed)
Anesthesia Evaluation  Patient identified by MRN, date of birth, ID band Patient awake    Reviewed: Allergy & Precautions, NPO status , Patient's Chart, lab work & pertinent test results  Airway Mallampati: III  TM Distance: <3 FB Neck ROM: full    Dental  (+) Chipped, Poor Dentition, Missing   Pulmonary neg shortness of breath, COPD, Current Smoker and Patient abstained from smoking.,    Pulmonary exam normal        Cardiovascular Exercise Tolerance: Good hypertension, (-) angina+ CAD and + Cardiac Stents  Normal cardiovascular exam     Neuro/Psych PSYCHIATRIC DISORDERS negative neurological ROS     GI/Hepatic Neg liver ROS, GERD  Controlled,  Endo/Other  negative endocrine ROSdiabetes  Renal/GU Renal disease  negative genitourinary   Musculoskeletal   Abdominal   Peds  Hematology negative hematology ROS (+)   Anesthesia Other Findings Past Medical History: No date: Anemia No date: Anginal pain (HCC) No date: Bradycardia No date: Cancer ([place]) No date: Coronary artery disease     Comment:  x 1 stent No date: Depression No date: Diabetes mellitus without complication (HCC) No date: GERD (gastroesophageal reflux disease) No date: History of kidney stones No date: Hyperlipidemia No date: Hypertension No date: Nephrolithiasis  Past Surgical History: No date: CARDIAC CATHETERIZATION No date: CATARACT EXTRACTION No date: CORONARY ANGIOPLASTY 07/27/2022: FLEXIBLE SIGMOIDOSCOPY; N/A     Comment:  Procedure: FLEXIBLE SIGMOIDOSCOPY;  Surgeon: Lesly Rubenstein, MD;  Location: [facility] ENDOSCOPY;  Service:               Endoscopy;  Laterality: N/A; No date: KIDNEY STONE SURGERY 12/18/2011: NECK SURGERY No date: nephrolithiasis No date: stent (other) 07/28/2022: TRANSVERSE LOOP COLOSTOMY; N/A     Comment:  Procedure: TRANSVERSE LOOP COLOSTOMY;  Surgeon:               Ronny Bacon, MD;   Location: [facility] ORS;  Service:               General;  Laterality: N/A;  BMI    Body Mass Index: 24.14 kg/m      Reproductive/Obstetrics negative OB ROS                             Anesthesia Physical Anesthesia Plan  ASA: 3  Anesthesia Plan: General   Post-op Pain Management:    Induction: Intravenous  PONV Risk Score and Plan: Propofol infusion and TIVA  Airway Management Planned: Natural Airway and Nasal Cannula  Additional Equipment:   Intra-op Plan:   Post-operative Plan:   Informed Consent: I have reviewed the patients History and Physical, chart, labs and discussed the procedure including the risks, benefits and alternatives for the proposed anesthesia with the patient or authorized representative who has indicated his/her understanding and acceptance.     Dental Advisory Given  Plan Discussed with: Anesthesiologist, CRNA and Surgeon  Anesthesia Plan Comments: (Patient consented for risks of anesthesia including but not limited to:  - adverse reactions to medications - risk of airway placement if required - damage to eyes, teeth, lips or other oral mucosa - nerve damage due to positioning  - sore throat or hoarseness - Damage to heart, brain, nerves, lungs, other parts of body or loss of life  Patient voiced understanding.)        Anesthesia Quick Evaluation

## 2022-08-24 NOTE — Transfer of Care (Signed)
Immediate Anesthesia Transfer of Care Note  Patient: [name] [name]  Procedure(s) Performed: INSERTION PORT-A-CATH (Right)  Patient Location: PACU  Anesthesia Type:MAC  Level of Consciousness: awake and alert   Airway & Oxygen Therapy: Patient Spontanous Breathing  Post-op Assessment: Report given to RN and Post -op Vital signs reviewed and stable  Post vital signs: Reviewed and stable  Last Vitals:  Vitals Value Taken Time  BP 113/60 08/24/22 1412  Temp 36.3 C 08/24/22 1412  Pulse 65 08/24/22 1414  Resp 12 08/24/22 1415  SpO2 96 % 08/24/22 1414  Vitals shown include unvalidated device data.  Last Pain:  Vitals:   08/24/22 1116  TempSrc: Oral  PainSc: 0-No pain         Complications: No notable events documented.

## 2022-08-24 NOTE — Op Note (Signed)
SURGICAL PROCEDURE REPORT  DATE OF PROCEDURE: 08/24/2022   ATTENDING SURGEON:  Ronny Bacon, M.D., FACS   ANESTHESIA: MAC   PRE-OPERATIVE DIAGNOSIS:  Rectal cancer requiring durable central venous access for chemotherapy   POST-OPERATIVE DIAGNOSIS: Same.  PROCEDURE: (cpt: 38101, 75102)  Insertion of  subclavian vein Bard PowerPort central venous catheter with subcutaneous port under fluoroscopic guidance   INTRAOPERATIVE FINDINGS: Fluoroscopic confirmation of guidewire in superior vena cava, tip at fluoroscopy appears to be at caval atrial junction, excellent return and easy flush, well-secured tunneled central venous catheter with subcutaneous port at completion of the procedure    FLUOROSCOPY: as recorded.   ESTIMATED BLOOD LOSS: Minimal (<20 mL)   SPECIMENS: None   IMPLANTS: 25F tunneled Bard PowerPort central venous catheter with subcutaneous port  DRAINS: None   COMPLICATIONS: None apparent   CONDITION AT COMPLETION: Hemodynamically stable, awake   DISPOSITION: PACU   INDICATION(S) FOR PROCEDURE:  Patient is a 76 y.o. male who presented with obstructing rectal cancer requiring durable central venous access for chemotherapy. All risks, benefits, and alternatives to above elective procedures were discussed with the patient, who elected to proceed, and informed consent was accordingly obtained at that time.  DETAILS OF PROCEDURE:  Patient was brought to the operative suite and appropriately identified. Right subclavian access site and planned port placement site were prepped and draped in the usual sterile fashion. Following a brief timeout, in Trendelenburg position, percutaneous Right subclavian venous access was obtained using Seldinger technique, by which local anesthetic was injected over the right subclavian region, and access needle was inserted into the SCV vein, through which soft guidewire was advanced, over which access needle was withdrawn. Length of catheter  needed to position the catheter tip at the atrio-caval junction was then measured under direct fluoroscopic visualization, after which the catheter was cut to the measured length. Guidewire was secured, attention was directed to injection of local anesthetic along the planned port site, 2-3 cm transverse ipsilateral chest incision was made and confirmed to accommodate the subcutaneous port, and flushed measured catheter was attached to port, then the port was placed within the subcutaneous pocket. Insertion sheath was advanced over the guidewire, which was withdrawn along with the insertion sheath dilator.  The catheter was then advanced through the sheath into the SCV and SVC.  Port was confirmed to withdraw blood and flush easily, after which concentrated heparin was instilled into the port and catheter. Dermis at the subcutaneous pocket was re-approximated using buried interrupted 3-0 Vicryl suture, and 4-0 Monocryl suture was used to re-approximate skin at the insertion/subcutaneous port site in running subcuticular fashion for the subcutaneous port and buried interrupted fashion for the insertion site.  Using the straight Huebner needle the port was flushed with heparin at 1000 units/mL 4 to 5 mL was injected through the port.  Skin was cleaned, dried, and sterile skin glue was applied. Patient was then safely transferred to PACU for a chest x-ray.  I was present for all aspects of the procedures, and no intraprocedural complications were apparent.   Ronny Bacon, M.D., [facility] 08/24/2022 2:06 PM

## 2022-08-25 ENCOUNTER — Inpatient Hospital Stay: Payer: Medicare HMO

## 2022-08-25 ENCOUNTER — Inpatient Hospital Stay: Payer: Medicare HMO | Admitting: Oncology

## 2022-08-25 ENCOUNTER — Encounter: Payer: Self-pay | Admitting: Oncology

## 2022-08-25 VITALS — BP 132/70 | HR 63 | Resp 16

## 2022-08-25 VITALS — BP 118/63 | HR 61 | Temp 96.2°F | Resp 16 | Ht 72.0 in | Wt 180.0 lb

## 2022-08-25 DIAGNOSIS — F17210 Nicotine dependence, cigarettes, uncomplicated: Secondary | ICD-10-CM | POA: Diagnosis not present

## 2022-08-25 DIAGNOSIS — K219 Gastro-esophageal reflux disease without esophagitis: Secondary | ICD-10-CM | POA: Diagnosis not present

## 2022-08-25 DIAGNOSIS — E785 Hyperlipidemia, unspecified: Secondary | ICD-10-CM | POA: Diagnosis not present

## 2022-08-25 DIAGNOSIS — Z5111 Encounter for antineoplastic chemotherapy: Secondary | ICD-10-CM | POA: Diagnosis not present

## 2022-08-25 DIAGNOSIS — Z79899 Other long term (current) drug therapy: Secondary | ICD-10-CM | POA: Diagnosis not present

## 2022-08-25 DIAGNOSIS — R109 Unspecified abdominal pain: Secondary | ICD-10-CM | POA: Diagnosis not present

## 2022-08-25 DIAGNOSIS — C20 Malignant neoplasm of rectum: Secondary | ICD-10-CM

## 2022-08-25 DIAGNOSIS — Z7984 Long term (current) use of oral hypoglycemic drugs: Secondary | ICD-10-CM | POA: Diagnosis not present

## 2022-08-25 DIAGNOSIS — J432 Centrilobular emphysema: Secondary | ICD-10-CM | POA: Diagnosis not present

## 2022-08-25 DIAGNOSIS — D649 Anemia, unspecified: Secondary | ICD-10-CM | POA: Diagnosis not present

## 2022-08-25 DIAGNOSIS — E119 Type 2 diabetes mellitus without complications: Secondary | ICD-10-CM | POA: Diagnosis not present

## 2022-08-25 DIAGNOSIS — I2510 Atherosclerotic heart disease of native coronary artery without angina pectoris: Secondary | ICD-10-CM | POA: Diagnosis not present

## 2022-08-25 DIAGNOSIS — I700 Atherosclerosis of aorta: Secondary | ICD-10-CM | POA: Diagnosis not present

## 2022-08-25 DIAGNOSIS — I10 Essential (primary) hypertension: Secondary | ICD-10-CM | POA: Diagnosis not present

## 2022-08-25 DIAGNOSIS — D72819 Decreased white blood cell count, unspecified: Secondary | ICD-10-CM | POA: Diagnosis not present

## 2022-08-25 DIAGNOSIS — Z7982 Long term (current) use of aspirin: Secondary | ICD-10-CM | POA: Diagnosis not present

## 2022-08-25 DIAGNOSIS — Z7902 Long term (current) use of antithrombotics/antiplatelets: Secondary | ICD-10-CM | POA: Diagnosis not present

## 2022-08-25 LAB — COMPREHENSIVE METABOLIC PANEL
ALT: 15 U/L (ref 0–44)
AST: 21 U/L (ref 15–41)
Albumin: 3.5 g/dL (ref 3.5–5.0)
Alkaline Phosphatase: 69 U/L (ref 38–126)
Anion gap: 6 (ref 5–15)
BUN: 16 mg/dL (ref 8–23)
CO2: 24 mmol/L (ref 22–32)
Calcium: 8.5 mg/dL — ABNORMAL LOW (ref 8.9–10.3)
Chloride: 108 mmol/L (ref 98–111)
Creatinine, Ser: 0.83 mg/dL (ref 0.61–1.24)
GFR, Estimated: >60 mL/min (ref >60)
Glucose, Bld: 170 mg/dL — ABNORMAL HIGH (ref 70–99)
Potassium: 4 mmol/L (ref 3.5–5.1)
Sodium: 138 mmol/L (ref 135–145)
Total Bilirubin: 0.3 mg/dL (ref 0.3–1.2)
Total Protein: 6.3 g/dL — ABNORMAL LOW (ref 6.5–8.1)

## 2022-08-25 LAB — CBC WITH DIFFERENTIAL/PLATELET
Abs Immature Granulocytes: 0.03 10*3/uL (ref 0.00–0.07)
Basophils Absolute: 0 10*3/uL (ref 0.0–0.1)
Basophils Relative: 1 %
Eosinophils Absolute: 0.3 10*3/uL (ref 0.0–0.5)
Eosinophils Relative: 4 %
HCT: 25.5 % — ABNORMAL LOW (ref 39.0–52.0)
Hemoglobin: 8 g/dL — ABNORMAL LOW (ref 13.0–17.0)
Immature Granulocytes: 0 %
Lymphocytes Relative: 19 %
Lymphs Abs: 1.4 10*3/uL (ref 0.7–4.0)
MCH: 25.4 pg — ABNORMAL LOW (ref 26.0–34.0)
MCHC: 31.4 g/dL (ref 30.0–36.0)
MCV: 81 fL (ref 80.0–100.0)
Monocytes Absolute: 0.5 10*3/uL (ref 0.1–1.0)
Monocytes Relative: 7 %
Neutro Abs: 4.9 10*3/uL (ref 1.7–7.7)
Neutrophils Relative %: 69 %
Platelets: 237 10*3/uL (ref 150–400)
RBC: 3.15 MIL/uL — ABNORMAL LOW (ref 4.22–5.81)
RDW: 15.5 % (ref 11.5–15.5)
WBC: 7.1 10*3/uL (ref 4.0–10.5)
nRBC: 0 % (ref 0.0–0.2)

## 2022-08-25 MED ORDER — LEUCOVORIN CALCIUM INJECTION 350 MG
800.0000 mg | Freq: Once | INTRAVENOUS | Status: AC
  Administered 2022-08-25: 800 mg via INTRAVENOUS
  Filled 2022-08-25: qty 40

## 2022-08-25 MED ORDER — PALONOSETRON HCL INJECTION 0.25 MG/5ML
0.2500 mg | Freq: Once | INTRAVENOUS | Status: AC
  Administered 2022-08-25: 0.25 mg via INTRAVENOUS
  Filled 2022-08-25: qty 5

## 2022-08-25 MED ORDER — SODIUM CHLORIDE 0.9 % IV SOLN
10.0000 mg | Freq: Once | INTRAVENOUS | Status: AC
  Administered 2022-08-25: 10 mg via INTRAVENOUS
  Filled 2022-08-25: qty 10

## 2022-08-25 MED ORDER — SODIUM CHLORIDE 0.9 % IV SOLN
2400.0000 mg/m2 | INTRAVENOUS | Status: DC
  Administered 2022-08-25: 4850 mg via INTRAVENOUS
  Filled 2022-08-25: qty 97

## 2022-08-25 MED ORDER — DEXTROSE 5 % IV SOLN
Freq: Once | INTRAVENOUS | Status: AC
  Filled 2022-08-25: qty 250

## 2022-08-25 MED ORDER — OXALIPLATIN CHEMO INJECTION 100 MG/20ML
85.0000 mg/m2 | Freq: Once | INTRAVENOUS | Status: AC
  Administered 2022-08-25: 175 mg via INTRAVENOUS
  Filled 2022-08-25: qty 35

## 2022-08-25 MED ORDER — FLUOROURACIL CHEMO INJECTION 2.5 GM/50ML
400.0000 mg/m2 | Freq: Once | INTRAVENOUS | Status: AC
  Administered 2022-08-25: 800 mg via INTRAVENOUS
  Filled 2022-08-25: qty 16

## 2022-08-25 NOTE — Anesthesia Postprocedure Evaluation (Signed)
Anesthesia Post Note  Patient: Vern Guerette Wissinger  Procedure(s) Performed: INSERTION PORT-A-CATH (Right)  Patient location during evaluation: PACU Anesthesia Type: General Level of consciousness: awake and alert Pain management: pain level controlled Vital Signs Assessment: post-procedure vital signs reviewed and stable Respiratory status: spontaneous breathing, nonlabored ventilation, respiratory function stable and patient connected to nasal cannula oxygen Cardiovascular status: blood pressure returned to baseline and stable Postop Assessment: no apparent nausea or vomiting Anesthetic complications: no   No notable events documented.   Last Vitals:  Vitals:   08/24/22 1540 08/24/22 1610  BP: 132/62 (!) 121/59  Pulse: (!) 58 (!) 53  Resp: 16 16  Temp: (!) 36.1 C   SpO2: 100% 100%    Last Pain:  Vitals:   08/24/22 1610  TempSrc:   PainSc: 0-No pain                 Precious Haws Arush Gatliff

## 2022-08-25 NOTE — Patient Instructions (Signed)
[facility] AT [place]  Discharge Instructions: Thank you for choosing [facility] to provide your oncology and hematology care.  If you have a lab appointment with the [facility], please go directly to the [facility] and check in at the registration area.  Wear comfortable clothing and clothing appropriate for easy access to any Portacath or PICC line.   We strive to give you quality time with your provider. You may need to reschedule your appointment if you arrive late (15 or more minutes).  Arriving late affects you and other patients whose appointments are after yours.  Also, if you miss three or more appointments without notifying the office, you may be dismissed from the clinic at the provider's discretion.      For prescription refill requests, have your pharmacy contact our office and allow 72 hours for refills to be completed.    Today you received the following chemotherapy and/or immunotherapy agents oxaliplatin, leucovorin, 59f    To help prevent nausea and vomiting after your treatment, we encourage you to take your nausea medication as directed.  BELOW ARE SYMPTOMS THAT SHOULD BE REPORTED IMMEDIATELY: *FEVER GREATER THAN 100.4 F (38 C) OR HIGHER *CHILLS OR SWEATING *NAUSEA AND VOMITING THAT IS NOT CONTROLLED WITH YOUR NAUSEA MEDICATION *UNUSUAL SHORTNESS OF BREATH *UNUSUAL BRUISING OR BLEEDING *URINARY PROBLEMS (pain or burning when urinating, or frequent urination) *BOWEL PROBLEMS (unusual diarrhea, constipation, pain near the anus) TENDERNESS IN MOUTH AND THROAT WITH OR WITHOUT PRESENCE OF ULCERS (sore throat, sores in mouth, or a toothache) UNUSUAL RASH, SWELLING OR PAIN  UNUSUAL VAGINAL DISCHARGE OR ITCHING   Items with * indicate a potential emergency and should be followed up as soon as possible or go to the Emergency Department if any problems should occur.  Please show the CHEMOTHERAPY ALERT CARD or IMMUNOTHERAPY ALERT CARD  at check-in to the Emergency Department and triage nurse.  Should you have questions after your visit or need to cancel or reschedule your appointment, please [facility] [place] [phone] follow the prompts.  Office hours are 8:00 a.m. to 4:30 p.m. Monday - Friday. Please note that voicemails left after 4:00 p.m. may not be returned until the following business day.  We are closed weekends and major holidays. You have access to a nurse at all times for urgent questions. Please call the main number to the clinic [phone] follow the prompts.  For any non-urgent questions, you may also contact your provider using MyChart. We now offer e-Visits for anyone 133and older to request care online for non-urgent symptoms. For details visit [email or web address]   Also download the MyChart app! Go to the app store, search "MyChart", open the app, select [facility], and log in with your MyChart username and password.  Masks are optional in the cancer centers. If you would like for your care team to wear a mask while they are taking care of you, please let them know. For doctor visits, patients may have with them one support person who is at least 74years old. At this time, visitors are not allowed in the infusion area.  Oxaliplatin Injection What is this medication? OXALIPLATIN (ox AL i PLA tin) treats some types of cancer. It works by slowing down the growth of cancer cells. This medicine may be used for other purposes; ask your health care provider or pharmacist if you have questions. COMMON BRAND NAME(S): Eloxatin What should I tell my care  team before I take this medication? They need to know if you have any of these conditions: Heart disease History of irregular heartbeat or rhythm Liver disease Low blood cell levels (white cells, red cells, and platelets) Lung or breathing disease, such as asthma Take medications that treat or prevent blood  clots Tingling of the fingers, toes, or other nerve disorder An unusual or allergic reaction to oxaliplatin, other medications, foods, dyes, or preservatives If you or your partner are pregnant or trying to get pregnant Breast-feeding How should I use this medication? This medication is injected into a vein. It is given by your care team in a hospital or clinic setting. Talk to your care team about the use of this medication in children. Special care may be needed. Overdosage: If you think you have taken too much of this medicine contact a poison control center or emergency room at once. NOTE: This medicine is only for you. Do not share this medicine with others. What if I miss a dose? Keep appointments for follow-up doses. It is important not to miss a dose. Call your care team if you are unable to keep an appointment. What may interact with this medication? Do not take this medication with any of the following: Cisapride Dronedarone Pimozide Thioridazine This medication may also interact with the following: Aspirin and aspirin-like medications Certain medications that treat or prevent blood clots, such as warfarin, apixaban, dabigatran, and rivaroxaban Cisplatin Cyclosporine Diuretics Medications for infection, such as acyclovir, adefovir, amphotericin B, bacitracin, cidofovir, foscarnet, ganciclovir, gentamicin, pentamidine, vancomycin NSAIDs, medications for pain and inflammation, such as ibuprofen or naproxen Other medications that cause heart rhythm changes Pamidronate Zoledronic acid This list may not describe all possible interactions. Give your health care provider a list of all the medicines, herbs, non-prescription drugs, or dietary supplements you use. Also tell them if you smoke, drink alcohol, or use illegal drugs. Some items may interact with your medicine. What should I watch for while using this medication? Your condition will be monitored carefully while you are  receiving this medication. You may need blood work while taking this medication. This medication may make you feel generally unwell. This is not uncommon as chemotherapy can affect healthy cells as well as cancer cells. Report any side effects. Continue your course of treatment even though you feel ill unless your care team tells you to stop. This medication may increase your risk of getting an infection. Call your care team for advice if you get a fever, chills, sore throat, or other symptoms of a cold or flu. Do not treat yourself. Try to avoid being around people who are sick. Avoid taking medications that contain aspirin, acetaminophen, ibuprofen, naproxen, or ketoprofen unless instructed by your care team. These medications may hide a fever. Be careful brushing or flossing your teeth or using a toothpick because you may get an infection or bleed more easily. If you have any dental work done, tell your dentist you are receiving this medication. This medication can make you more sensitive to cold. Do not drink cold drinks or use ice. Cover exposed skin before coming in contact with cold temperatures or cold objects. When out in cold weather wear warm clothing and cover your mouth and nose to warm the air that goes into your lungs. Tell your care team if you get sensitive to the cold. Talk to your care team if you or your partner are pregnant or think either of you might be pregnant. This medication can cause  serious birth defects if taken during pregnancy and for 9 months after the last dose. A negative pregnancy test is required before starting this medication. A reliable form of contraception is recommended while taking this medication and for 9 months after the last dose. Talk to your care team about effective forms of contraception. Do not father a child while taking this medication and for 6 months after the last dose. Use a condom while having sex during this time period. Do not breastfeed while  taking this medication and for 3 months after the last dose. This medication may cause infertility. Talk to your care team if you are concerned about your fertility. What side effects may I notice from receiving this medication? Side effects that you should report to your care team as soon as possible: Allergic reactions--skin rash, itching, hives, swelling of the face, lips, tongue, or throat Bleeding--bloody or black, tar-like stools, vomiting blood or brown material that looks like coffee grounds, red or dark brown urine, small red or purple spots on skin, unusual bruising or bleeding Dry cough, shortness of breath or trouble breathing Heart rhythm changes--fast or irregular heartbeat, dizziness, feeling faint or lightheaded, chest pain, trouble breathing Infection--fever, chills, cough, sore throat, wounds that don't heal, pain or trouble when passing urine, general feeling of discomfort or being unwell Liver injury--right upper belly pain, loss of appetite, nausea, light-colored stool, dark yellow or brown urine, yellowing skin or eyes, unusual weakness or fatigue Low red blood cell level--unusual weakness or fatigue, dizziness, headache, trouble breathing Muscle injury--unusual weakness or fatigue, muscle pain, dark yellow or brown urine, decrease in amount of urine Pain, tingling, or numbness in the hands or feet Sudden and severe headache, confusion, change in vision, seizures, which may be signs of posterior reversible encephalopathy syndrome (PRES) Unusual bruising or bleeding Side effects that usually do not require medical attention (report to your care team if they continue or are bothersome): Diarrhea Nausea Pain, redness, or swelling with sores inside the mouth or throat Unusual weakness or fatigue Vomiting This list may not describe all possible side effects. Call your doctor for medical advice about side effects. You may report side effects to FDA at 1-800-FDA-1088. Where  should I keep my medication? This medication is given in a hospital or clinic. It will not be stored at home. NOTE: This sheet is a summary. It may not cover all possible information. If you have questions about this medicine, talk to your doctor, pharmacist, or health care provider.  2023 Elsevier/Gold Standard (2022-02-27 00:00:00)

## 2022-08-27 ENCOUNTER — Inpatient Hospital Stay: Payer: Medicare HMO

## 2022-08-27 VITALS — BP 150/64 | HR 66 | Resp 18

## 2022-08-27 DIAGNOSIS — E785 Hyperlipidemia, unspecified: Secondary | ICD-10-CM | POA: Diagnosis not present

## 2022-08-27 DIAGNOSIS — I2510 Atherosclerotic heart disease of native coronary artery without angina pectoris: Secondary | ICD-10-CM | POA: Diagnosis not present

## 2022-08-27 DIAGNOSIS — Z5111 Encounter for antineoplastic chemotherapy: Secondary | ICD-10-CM | POA: Diagnosis not present

## 2022-08-27 DIAGNOSIS — R109 Unspecified abdominal pain: Secondary | ICD-10-CM | POA: Diagnosis not present

## 2022-08-27 DIAGNOSIS — K219 Gastro-esophageal reflux disease without esophagitis: Secondary | ICD-10-CM | POA: Diagnosis not present

## 2022-08-27 DIAGNOSIS — D72819 Decreased white blood cell count, unspecified: Secondary | ICD-10-CM | POA: Diagnosis not present

## 2022-08-27 DIAGNOSIS — C20 Malignant neoplasm of rectum: Secondary | ICD-10-CM | POA: Diagnosis not present

## 2022-08-27 DIAGNOSIS — E119 Type 2 diabetes mellitus without complications: Secondary | ICD-10-CM | POA: Diagnosis not present

## 2022-08-27 DIAGNOSIS — D649 Anemia, unspecified: Secondary | ICD-10-CM | POA: Diagnosis not present

## 2022-08-27 MED ORDER — SODIUM CHLORIDE 0.9% FLUSH
10.0000 mL | INTRAVENOUS | Status: DC | PRN
  Administered 2022-08-27: 10 mL
  Filled 2022-08-27: qty 10

## 2022-08-27 MED ORDER — HEPARIN SOD (PORK) LOCK FLUSH 100 UNIT/ML IV SOLN
500.0000 [IU] | Freq: Once | INTRAVENOUS | Status: AC | PRN
  Administered 2022-08-27: 500 [IU]
  Filled 2022-08-27: qty 5

## 2022-08-27 NOTE — Progress Notes (Deleted)
[place]  [phone] [phone]  ID: [name] [name] OB: 12/22/1945  MR#: [id number]  JKD#:[id number]  Patient Care Team: [facility] as PCP - General [name], [name] November, MD as PCP - Cardiology (Cardiology) [name], MD as Consulting Physician (Cardiology) [name], RN as Oncology Nurse Navigator  CHIEF COMPLAINT: Stage IIa rectal adenocarcinoma.  INTERVAL HISTORY: Patient returns to clinic today for further evaluation and initiation of cycle 1 of 8 of neoadjuvant FOLFOX.  He continues to feel well and is asymptomatic.  He has no neurologic complaints.  He denies any recent fevers or illnesses.  He has no chest pain, shortness of breath, cough, or hemoptysis.  He denies any nausea, vomiting, constipation, or diarrhea.  He does not report any melena or hematochezia.  He has no urinary complaints.  Patient is no specific complaints today.  REVIEW OF SYSTEMS:   Review of Systems  Constitutional: Negative.  Negative for fever, malaise/fatigue and weight loss.  Respiratory: Negative.  Negative for cough, hemoptysis and shortness of breath.   Cardiovascular: Negative.  Negative for chest pain and leg swelling.  Gastrointestinal: Negative.  Negative for abdominal pain.  Genitourinary: Negative.  Negative for dysuria.  Musculoskeletal: Negative.  Negative for back pain.  Skin: Negative.  Negative for rash.  Neurological: Negative.  Negative for dizziness, focal weakness, weakness and headaches.  Psychiatric/Behavioral: Negative.  The patient is not nervous/anxious.     As per HPI. Otherwise, a complete review of systems is negative.  PAST MEDICAL HISTORY: Past Medical History:  Diagnosis Date   Anemia    Anginal pain (HCC)    Bradycardia    Cancer (HCC)    Coronary artery disease    x 1 stent   Depression    Diabetes mellitus without complication (HCC)    GERD (gastroesophageal reflux disease)     History of kidney stones    Hyperlipidemia    Hypertension    Nephrolithiasis     PAST SURGICAL HISTORY: Past Surgical History:  Procedure Laterality Date   CARDIAC CATHETERIZATION     CATARACT EXTRACTION     CORONARY ANGIOPLASTY     FLEXIBLE SIGMOIDOSCOPY N/A 07/27/2022   Procedure: FLEXIBLE SIGMOIDOSCOPY;  Surgeon: Lesly Rubenstein, MD;  Location: [facility];  Service: Endoscopy;  Laterality: N/A;   KIDNEY STONE SURGERY     NECK SURGERY  12/18/2011   nephrolithiasis     PORTACATH PLACEMENT Right 08/24/2022   Procedure: INSERTION PORT-A-CATH;  Surgeon: Ronny Bacon, MD;  Location: [facility] ORS;  Service: General;  Laterality: Right;   stent (other)     TRANSVERSE LOOP COLOSTOMY N/A 07/28/2022   Procedure: TRANSVERSE LOOP COLOSTOMY;  Surgeon: Ronny Bacon, MD;  Location: [facility] ORS;  Service: General;  Laterality: N/A;    FAMILY HISTORY: Family History  Problem Relation Age of Onset   Heart disease Father     ADVANCED DIRECTIVES (Y/N):  N  HEALTH MAINTENANCE: Social History   Tobacco Use   Smoking status: Every Day    Packs/day: 0.50    Years: 36.00    Total pack years: 18.00    Types: Cigarettes   Smokeless tobacco: Never  Vaping Use   Vaping Use: Never used  Substance Use Topics   Alcohol use: No   Drug use: No     Colonoscopy:  PAP:  Bone density:  Lipid panel:  No Known Allergies  Current Outpatient Medications  Medication Sig Dispense Refill   aspirin 81 MG EC  tablet Take 81 mg by mouth daily.       atorvastatin (LIPITOR) 40 MG tablet Take 1 tablet (40 mg total) by mouth daily. 90 tablet 3   clopidogrel (PLAVIX) 75 MG tablet Take 75 mg by mouth daily. (Patient not taking: Reported on 08/25/2022)     cyanocobalamin (VITAMIN B12) 500 MCG tablet Take 500 mcg by mouth daily.     famotidine (PEPCID) 40 MG tablet Take 1 tablet (40 mg total) by mouth daily. 90 tablet 3   glimepiride (AMARYL) 4 MG tablet Take 4 mg by mouth daily with breakfast.      glimepiride (AMARYL) 4 MG tablet Take 4 mg by mouth daily with breakfast.     HYDROcodone-acetaminophen (NORCO/VICODIN) 5-325 MG tablet Take 1 tablet by mouth every 6 (six) hours as needed for moderate pain.     ibuprofen (ADVIL) 800 MG tablet Take 1 tablet (800 mg total) by mouth every 8 (eight) hours as needed. 30 tablet 0   isosorbide mononitrate (IMDUR) 30 MG 24 hr tablet Take 1 tablet (30 mg total) by mouth daily. 90 tablet 3   lidocaine-prilocaine (EMLA) cream Apply to affected area once 30 g 3   metFORMIN (GLUCOPHAGE) 1000 MG tablet Take 1,000 mg by mouth 2 (two) times daily with a meal.      metFORMIN (GLUCOPHAGE) 1000 MG tablet Take 1,000 mg by mouth 2 (two) times daily with a meal.     nitroGLYCERIN (NITROSTAT) 0.4 MG SL tablet Place 1 tablet (0.4 mg total) under the tongue every 5 (five) minutes as needed for chest pain. 25 tablet 6   ondansetron (ZOFRAN) 8 MG tablet Take 1 tablet (8 mg total) by mouth every 8 (eight) hours as needed for nausea or vomiting. 60 tablet 2   pioglitazone (ACTOS) 15 MG tablet Take 15 mg by mouth daily.     prochlorperazine (COMPAZINE) 10 MG tablet Take 1 tablet (10 mg total) by mouth every 6 (six) hours as needed for nausea or vomiting. 60 tablet 2   No current facility-administered medications for this visit.    OBJECTIVE: There were no vitals filed for this visit.    There is no height or weight on file to calculate BMI.    ECOG FS:0 - Asymptomatic  General: Well-developed, well-nourished, no acute distress. Eyes: Pink conjunctiva, anicteric sclera. HEENT: Normocephalic, moist mucous membranes. Lungs: No audible wheezing or coughing. Heart: Regular rate and rhythm. Abdomen: Soft, nontender, no obvious distention.  Colostomy bag noted. Musculoskeletal: No edema, cyanosis, or clubbing. Neuro: Alert, answering all questions appropriately. Cranial nerves grossly intact. Skin: No rashes or petechiae noted. Psych: Normal affect.   LAB RESULTS:  Lab  Results  Component Value Date   NA 138 08/25/2022   K 4.0 08/25/2022   CL 108 08/25/2022   CO2 24 08/25/2022   GLUCOSE 170 (H) 08/25/2022   BUN 16 08/25/2022   CREATININE 0.83 08/25/2022   CALCIUM 8.5 (L) 08/25/2022   PROT 6.3 (L) 08/25/2022   ALBUMIN 3.5 08/25/2022   AST 21 08/25/2022   ALT 15 08/25/2022   ALKPHOS 69 08/25/2022   BILITOT 0.3 08/25/2022   GFRNONAA >60 08/25/2022   GFRAA >60 12/20/2014    Lab Results  Component Value Date   WBC 7.1 08/25/2022   NEUTROABS 4.9 08/25/2022   HGB 8.0 (L) 08/25/2022   HCT 25.5 (L) 08/25/2022   MCV 81.0 08/25/2022   PLT 237 08/25/2022     STUDIES: DG Chest Port 1 View  Result Date:  08/24/2022 CLINICAL DATA:  Port-A-Cath in place. EXAM: PORTABLE CHEST 1 VIEW COMPARISON:  Chest CT 08/07/2022 FINDINGS: Right chest port with tip in the mid SVC. No pneumothorax. The heart is normal in size with normal mediastinal contours. Aortic atherosclerosis. No focal airspace disease, the ill-defined opacities in the right lower lobe on prior CT are not seen by radiograph. No pleural fluid. Stable osseous structures. IMPRESSION: Right chest port with tip in the mid SVC. No pneumothorax. Electronically Signed   By: Keith Rake M.D.   On: 08/24/2022 15:36   DG C-Arm 1-60 Min-No Report  Result Date: 08/24/2022 Fluoroscopy was utilized by the requesting physician.  No radiographic interpretation.   MR PELVIS WO CM RECTAL CA STAGING  Result Date: 08/10/2022 CLINICAL DATA:  New diagnosis of rectal cancer. Large fungating mass 10 cm from anal verge on sigmoidoscopy. EXAM: MRI PELVIS WITHOUT CONTRAST TECHNIQUE: Multiplanar multisequence MR imaging of the pelvis was performed. No intravenous contrast was administered. Ultrasound gel was administered per rectum to optimize tumor evaluation. COMPARISON:  CT of 08/07/2022.  Sigmoidoscopy report 07/27/2022 FINDINGS: Evaluation is somewhat challenging secondary to the position at the rectosigmoid junction.  TUMOR LOCATION Tumor distance from Anal Verge/Skin Surface:  10.7 cm, image 24/12 Tumor distance to Internal Anal Sphincter: 9.6 cm, image 27/7 TUMOR DESCRIPTION Circumferential Extent: Circumferentiala, including on 11/08 and 27/7 Tumor Length: 2.5 cm, image 19/3 T - CATEGORY Extension through [employer]: Small volume on the left at 3 mm on 10/08-T3b Shortest Distance of any tumor/node from Mesorectal Fascia: 1.9 cm on 10/08 Extramural Vascular Invasion/Tumor Thrombus: No Invasion of Anterior Peritoneal Reflection: No Involvement of Adjacent Organs or Pelvic Sidewall: No Levator Ani Involvement: No N - CATEGORY Mesorectal Lymph Nodes None=N0 Extra-mesorectal Lymphadenopathy: No Other: Trace free pelvic fluid. Small bilateral hydroceles. Normal urinary bladder and prostate. Resolution of upstream bowel obstruction. Proximal sigmoid colonic wall thickening could be due to underdistention but is indeterminate. Scattered colonic diverticula. IMPRESSION: Rectal adenocarcinoma T stage: T3b Rectal adenocarcinoma N stage:  0 Distance from tumor to the internal anal sphincter is 9.6 cm. Electronically Signed   By: Abigail Miyamoto M.D.   On: 08/10/2022 08:30   CT Chest W Contrast  Result Date: 08/08/2022 CLINICAL DATA:  76 year old male with history of new diagnosis of rectal cancer. Staging examination. * Tracking Code: BO * EXAM: CT CHEST WITH CONTRAST TECHNIQUE: Multidetector CT imaging of the chest was performed during intravenous contrast administration. RADIATION DOSE REDUCTION: This exam was performed according to the departmental dose-optimization program which includes automated exposure control, adjustment of the mA and/or kV according to patient size and/or use of iterative reconstruction technique. CONTRAST:  61m OMNIPAQUE IOHEXOL 300 MG/ML  SOLN COMPARISON:  Chest CT 12/14/2014. FINDINGS: Cardiovascular: Heart size is normal. There is no significant pericardial fluid, thickening or pericardial  calcification. There is aortic atherosclerosis, as well as atherosclerosis of the great vessels of the mediastinum and the coronary arteries, including calcified atherosclerotic plaque in the left main, left anterior descending, left circumflex and right coronary arteries. Mediastinum/Nodes: No pathologically enlarged mediastinal or hilar lymph nodes. Esophagus is unremarkable in appearance. No axillary lymphadenopathy. Lungs/Pleura: Some patchy areas of very mild ground-glass attenuation, septal thickening and centrilobular ground-glass attenuation micro nodularity are noted in the right lower lobe. No other larger more suspicious appearing pulmonary nodules or masses are noted. No acute consolidative airspace disease. No pleural effusions. Mild diffuse bronchial wall thickening with mild centrilobular and paraseptal emphysema. Upper Abdomen: Aortic atherosclerosis. Left upper quadrant colostomy  incompletely imaged. Musculoskeletal: There are no aggressive appearing lytic or blastic lesions noted in the visualized portions of the skeleton. Orthopedic fixation hardware in the lower cervical spine incidentally noted. IMPRESSION: 1. No definitive findings to suggest metastatic disease to the thorax. 2. There are some nonspecific findings in the right lower lobe which likely reflect resolving infection. Repeat noncontrast chest CT should be considered in 12 months to ensure the stability or regression of these findings. 3. Mild diffuse bronchial wall thickening with mild centrilobular and paraseptal emphysema; imaging findings suggestive of underlying COPD. 4. Aortic atherosclerosis, in addition to left main and three-vessel coronary artery disease. Assessment for potential risk factor modification, dietary therapy or pharmacologic therapy may be warranted, if clinically indicated. Aortic Atherosclerosis (ICD10-I70.0) and Emphysema (ICD10-J43.9). Electronically Signed   By: Vinnie Langton M.D.   On: 08/08/2022 12:32     ASSESSMENT: Stage IIa rectal adenocarcinoma.  PLAN:    Stage IIa rectal adenocarcinoma: Diagnosis confirmed by imaging and biopsy.  MRI and CT scan results reviewed independently and reported as above confirming stage of disease.  Patient will benefit from neoadjuvant FOLFOX every 2 weeks x 8 cycles followed by concurrent Xeloda and daily XRT.  Finally, patient will benefit from definitive surgical intervention of his residual malignancy.  Unclear if colostomy is reversible.  Patient has now had port placement.  Proceed with cycle 1 of FOLFOX today.  Return to clinic in 2 days for pump removal, 1 week for laboratory work and further evaluation, in 2 weeks for further evaluation and consideration of cycle 2.     Abdominal pain: Improved with diverting colostomy. Anemia: Hemoglobin has trended down to 8.0, monitor.  Consider transfusion if it continues to trend down.  I spent a total of 30 minutes reviewing chart data, face-to-face evaluation with the patient, counseling and coordination of care as detailed above.   Patient expressed understanding and was in agreement with this plan. He also understands that He can call clinic at any time with any questions, concerns, or complaints.    Cancer Staging  Rectal adenocarcinoma [facility]) Staging form: Colon and Rectum, AJCC 8th Edition - Clinical stage from 08/13/2022: Stage IIA (cT3, cN0, cM0) - Signed by Lloyd Huger, MD on 08/13/2022 Total positive nodes: 0  Lloyd Huger, MD   08/27/2022 2:05 PM

## 2022-09-01 ENCOUNTER — Encounter: Payer: Self-pay | Admitting: Oncology

## 2022-09-01 ENCOUNTER — Ambulatory Visit: Payer: Medicare HMO | Admitting: Oncology

## 2022-09-01 ENCOUNTER — Inpatient Hospital Stay (HOSPITAL_BASED_OUTPATIENT_CLINIC_OR_DEPARTMENT_OTHER): Payer: Medicare HMO | Admitting: Oncology

## 2022-09-01 ENCOUNTER — Inpatient Hospital Stay: Payer: Medicare HMO

## 2022-09-01 ENCOUNTER — Other Ambulatory Visit: Payer: Medicare HMO

## 2022-09-01 ENCOUNTER — Other Ambulatory Visit: Payer: Self-pay | Admitting: Oncology

## 2022-09-01 VITALS — BP 138/77 | HR 65 | Temp 95.9°F | Resp 16 | Ht 72.0 in | Wt 179.5 lb

## 2022-09-01 DIAGNOSIS — E785 Hyperlipidemia, unspecified: Secondary | ICD-10-CM | POA: Diagnosis not present

## 2022-09-01 DIAGNOSIS — D649 Anemia, unspecified: Secondary | ICD-10-CM | POA: Diagnosis not present

## 2022-09-01 DIAGNOSIS — E119 Type 2 diabetes mellitus without complications: Secondary | ICD-10-CM | POA: Diagnosis not present

## 2022-09-01 DIAGNOSIS — C20 Malignant neoplasm of rectum: Secondary | ICD-10-CM

## 2022-09-01 DIAGNOSIS — I2510 Atherosclerotic heart disease of native coronary artery without angina pectoris: Secondary | ICD-10-CM | POA: Diagnosis not present

## 2022-09-01 DIAGNOSIS — R109 Unspecified abdominal pain: Secondary | ICD-10-CM | POA: Diagnosis not present

## 2022-09-01 DIAGNOSIS — K219 Gastro-esophageal reflux disease without esophagitis: Secondary | ICD-10-CM | POA: Diagnosis not present

## 2022-09-01 DIAGNOSIS — Z5111 Encounter for antineoplastic chemotherapy: Secondary | ICD-10-CM | POA: Diagnosis not present

## 2022-09-01 DIAGNOSIS — D72819 Decreased white blood cell count, unspecified: Secondary | ICD-10-CM | POA: Diagnosis not present

## 2022-09-01 LAB — COMPREHENSIVE METABOLIC PANEL
ALT: 11 U/L (ref 0–44)
AST: 16 U/L (ref 15–41)
Albumin: 3.8 g/dL (ref 3.5–5.0)
Alkaline Phosphatase: 60 U/L (ref 38–126)
Anion gap: 6 (ref 5–15)
BUN: 16 mg/dL (ref 8–23)
CO2: 25 mmol/L (ref 22–32)
Calcium: 8.8 mg/dL — ABNORMAL LOW (ref 8.9–10.3)
Chloride: 106 mmol/L (ref 98–111)
Creatinine, Ser: 0.81 mg/dL (ref 0.61–1.24)
GFR, Estimated: >60 mL/min (ref >60)
Glucose, Bld: 109 mg/dL — ABNORMAL HIGH (ref 70–99)
Potassium: 3.8 mmol/L (ref 3.5–5.1)
Sodium: 137 mmol/L (ref 135–145)
Total Bilirubin: 0.5 mg/dL (ref 0.3–1.2)
Total Protein: 6.9 g/dL (ref 6.5–8.1)

## 2022-09-01 LAB — CBC WITH DIFFERENTIAL/PLATELET
Abs Immature Granulocytes: 0.01 10*3/uL (ref 0.00–0.07)
Basophils Absolute: 0 10*3/uL (ref 0.0–0.1)
Basophils Relative: 1 %
Eosinophils Absolute: 0.2 10*3/uL (ref 0.0–0.5)
Eosinophils Relative: 4 %
HCT: 26.7 % — ABNORMAL LOW (ref 39.0–52.0)
Hemoglobin: 8.3 g/dL — ABNORMAL LOW (ref 13.0–17.0)
Immature Granulocytes: 0 %
Lymphocytes Relative: 30 %
Lymphs Abs: 1.3 10*3/uL (ref 0.7–4.0)
MCH: 24.9 pg — ABNORMAL LOW (ref 26.0–34.0)
MCHC: 31.1 g/dL (ref 30.0–36.0)
MCV: 79.9 fL — ABNORMAL LOW (ref 80.0–100.0)
Monocytes Absolute: 0.1 10*3/uL (ref 0.1–1.0)
Monocytes Relative: 2 %
Neutro Abs: 2.6 10*3/uL (ref 1.7–7.7)
Neutrophils Relative %: 63 %
Platelets: 161 10*3/uL (ref 150–400)
RBC: 3.34 MIL/uL — ABNORMAL LOW (ref 4.22–5.81)
RDW: 15.4 % (ref 11.5–15.5)
WBC: 4.1 10*3/uL (ref 4.0–10.5)
nRBC: 0 % (ref 0.0–0.2)

## 2022-09-01 NOTE — Progress Notes (Signed)
[place]  [phone] [phone]) [phone]  ID: [name] [name] OB: 07-21-1946  MR#: [id number]  AOZ#:[id number]  Patient Care Team: [facility] as PCP - General [name], [name] November, MD as PCP - Cardiology (Cardiology) [name], MD as Consulting Physician (Cardiology) [name], RN as Oncology Nurse Navigator  CHIEF COMPLAINT: Stage IIa rectal adenocarcinoma.  INTERVAL HISTORY: Patient returns to clinic today for further evaluation and assess his toleration of cycle 1 of 8 of neoadjuvant FOLFOX.  He rated his treatment well without significant side effects.  He has some mild tenderness at his port, but otherwise feels well.  He has no neurologic complaints.  He denies any recent fevers or illnesses.  He has no chest pain, shortness of breath, cough, or hemoptysis.  He denies any nausea, vomiting, constipation, or diarrhea.  He does not report any melena or hematochezia.  He has no urinary complaints.  Patient offers no further specific complaints today.  REVIEW OF SYSTEMS:   Review of Systems  Constitutional: Negative.  Negative for fever, malaise/fatigue and weight loss.  Respiratory: Negative.  Negative for cough, hemoptysis and shortness of breath.   Cardiovascular: Negative.  Negative for chest pain and leg swelling.  Gastrointestinal: Negative.  Negative for abdominal pain.  Genitourinary: Negative.  Negative for dysuria.  Musculoskeletal: Negative.  Negative for back pain.  Skin: Negative.  Negative for rash.  Neurological: Negative.  Negative for dizziness, focal weakness, weakness and headaches.  Psychiatric/Behavioral: Negative.  The patient is not nervous/anxious.     As per HPI. Otherwise, a complete review of systems is negative.  PAST MEDICAL HISTORY: Past Medical History:  Diagnosis Date   Anemia    Anginal pain (HCC)    Bradycardia    Cancer (HCC)    Coronary artery disease    x 1 stent    Depression    Diabetes mellitus without complication (HCC)    GERD (gastroesophageal reflux disease)    History of kidney stones    Hyperlipidemia    Hypertension    Nephrolithiasis     PAST SURGICAL HISTORY: Past Surgical History:  Procedure Laterality Date   CARDIAC CATHETERIZATION     CATARACT EXTRACTION     CORONARY ANGIOPLASTY     FLEXIBLE SIGMOIDOSCOPY N/A 07/27/2022   Procedure: FLEXIBLE SIGMOIDOSCOPY;  Surgeon: Lesly Rubenstein, MD;  Location: [facility] ENDOSCOPY;  Service: Endoscopy;  Laterality: N/A;   KIDNEY STONE SURGERY     NECK SURGERY  12/18/2011   nephrolithiasis     PORTACATH PLACEMENT Right 08/24/2022   Procedure: INSERTION PORT-A-CATH;  Surgeon: Ronny Bacon, MD;  Location: [facility] ORS;  Service: General;  Laterality: Right;   stent (other)     TRANSVERSE LOOP COLOSTOMY N/A 07/28/2022   Procedure: TRANSVERSE LOOP COLOSTOMY;  Surgeon: Ronny Bacon, MD;  Location: [facility] ORS;  Service: General;  Laterality: N/A;    FAMILY HISTORY: Family History  Problem Relation Age of Onset   Heart disease Father     ADVANCED DIRECTIVES (Y/N):  N  HEALTH MAINTENANCE: Social History   Tobacco Use   Smoking status: Every Day    Packs/day: 0.50    Years: 36.00    Total pack years: 18.00    Types: Cigarettes   Smokeless tobacco: Never  Vaping Use   Vaping Use: Never used  Substance Use Topics   Alcohol use: No   Drug use: No     Colonoscopy:  PAP:  Bone density:  Lipid panel:  No  Known Allergies  Current Outpatient Medications  Medication Sig Dispense Refill   aspirin 81 MG EC tablet Take 81 mg by mouth daily.       atorvastatin (LIPITOR) 40 MG tablet Take 1 tablet (40 mg total) by mouth daily. 90 tablet 3   cyanocobalamin (VITAMIN B12) 500 MCG tablet Take 500 mcg by mouth daily.     famotidine (PEPCID) 40 MG tablet Take 1 tablet (40 mg total) by mouth daily. 90 tablet 3   glimepiride (AMARYL) 4 MG tablet Take 4 mg by mouth daily with breakfast.      HYDROcodone-acetaminophen (NORCO/VICODIN) 5-325 MG tablet Take 1 tablet by mouth every 6 (six) hours as needed for moderate pain.     ibuprofen (ADVIL) 800 MG tablet Take 1 tablet (800 mg total) by mouth every 8 (eight) hours as needed. 30 tablet 0   isosorbide mononitrate (IMDUR) 30 MG 24 hr tablet Take 1 tablet (30 mg total) by mouth daily. 90 tablet 3   lidocaine-prilocaine (EMLA) cream Apply to affected area once 30 g 3   metFORMIN (GLUCOPHAGE) 1000 MG tablet Take 1,000 mg by mouth 2 (two) times daily with a meal.     nitroGLYCERIN (NITROSTAT) 0.4 MG SL tablet Place 1 tablet (0.4 mg total) under the tongue every 5 (five) minutes as needed for chest pain. 25 tablet 6   ondansetron (ZOFRAN) 8 MG tablet Take 1 tablet (8 mg total) by mouth every 8 (eight) hours as needed for nausea or vomiting. 60 tablet 2   pioglitazone (ACTOS) 15 MG tablet Take 15 mg by mouth daily.     prochlorperazine (COMPAZINE) 10 MG tablet Take 1 tablet (10 mg total) by mouth every 6 (six) hours as needed for nausea or vomiting. 60 tablet 2   clopidogrel (PLAVIX) 75 MG tablet Take 75 mg by mouth daily. (Patient not taking: Reported on 09/01/2022)     glimepiride (AMARYL) 4 MG tablet Take 4 mg by mouth daily with breakfast.     metFORMIN (GLUCOPHAGE) 1000 MG tablet Take 1,000 mg by mouth 2 (two) times daily with a meal.      No current facility-administered medications for this visit.    OBJECTIVE: Vitals:   09/01/22 1019  BP: 138/77  Pulse: 65  Resp: 16  Temp: (!) 95.9 F (35.5 C)  SpO2: 100%     Body mass index is 24.34 kg/m.    ECOG FS:0 - Asymptomatic  General: Well-developed, well-nourished, no acute distress. Eyes: Pink conjunctiva, anicteric sclera. HEENT: Normocephalic, moist mucous membranes. Lungs: No audible wheezing or coughing. Heart: Regular rate and rhythm. Abdomen: Soft, nontender, no obvious distention. Musculoskeletal: No edema, cyanosis, or clubbing.  Colostomy bag noted. Neuro: Alert,  answering all questions appropriately. Cranial nerves grossly intact. Skin: No rashes or petechiae noted. Psych: Normal affect.  LAB RESULTS:  Lab Results  Component Value Date   NA 137 09/01/2022   K 3.8 09/01/2022   CL 106 09/01/2022   CO2 25 09/01/2022   GLUCOSE 109 (H) 09/01/2022   BUN 16 09/01/2022   CREATININE 0.81 09/01/2022   CALCIUM 8.8 (L) 09/01/2022   PROT 6.9 09/01/2022   ALBUMIN 3.8 09/01/2022   AST 16 09/01/2022   ALT 11 09/01/2022   ALKPHOS 60 09/01/2022   BILITOT 0.5 09/01/2022   GFRNONAA >60 09/01/2022   GFRAA >60 12/20/2014    Lab Results  Component Value Date   WBC 4.1 09/01/2022   NEUTROABS 2.6 09/01/2022   HGB 8.3 (L) 09/01/2022  HCT 26.7 (L) 09/01/2022   MCV 79.9 (L) 09/01/2022   PLT 161 09/01/2022     STUDIES: DG Chest Port 1 View  Result Date: 08/24/2022 CLINICAL DATA:  Port-A-Cath in place. EXAM: PORTABLE CHEST 1 VIEW COMPARISON:  Chest CT 08/07/2022 FINDINGS: Right chest port with tip in the mid SVC. No pneumothorax. The heart is normal in size with normal mediastinal contours. Aortic atherosclerosis. No focal airspace disease, the ill-defined opacities in the right lower lobe on prior CT are not seen by radiograph. No pleural fluid. Stable osseous structures. IMPRESSION: Right chest port with tip in the mid SVC. No pneumothorax. Electronically Signed   By: Keith Rake M.D.   On: 08/24/2022 15:36   DG C-Arm 1-60 Min-No Report  Result Date: 08/24/2022 Fluoroscopy was utilized by the requesting physician.  No radiographic interpretation.   MR PELVIS WO CM RECTAL CA STAGING  Result Date: 08/10/2022 CLINICAL DATA:  New diagnosis of rectal cancer. Large fungating mass 10 cm from anal verge on sigmoidoscopy. EXAM: MRI PELVIS WITHOUT CONTRAST TECHNIQUE: Multiplanar multisequence MR imaging of the pelvis was performed. No intravenous contrast was administered. Ultrasound gel was administered per rectum to optimize tumor evaluation. COMPARISON:   CT of 08/07/2022.  Sigmoidoscopy report 07/27/2022 FINDINGS: Evaluation is somewhat challenging secondary to the position at the rectosigmoid junction. TUMOR LOCATION Tumor distance from Anal Verge/Skin Surface:  10.7 cm, image 24/12 Tumor distance to Internal Anal Sphincter: 9.6 cm, image 27/7 TUMOR DESCRIPTION Circumferential Extent: Circumferentiala, including on 11/08 and 27/7 Tumor Length: 2.5 cm, image 19/3 T - CATEGORY Extension through [employer]: Small volume on the left at 3 mm on 10/08-T3b Shortest Distance of any tumor/node from Mesorectal Fascia: 1.9 cm on 10/08 Extramural Vascular Invasion/Tumor Thrombus: No Invasion of Anterior Peritoneal Reflection: No Involvement of Adjacent Organs or Pelvic Sidewall: No Levator Ani Involvement: No N - CATEGORY Mesorectal Lymph Nodes None=N0 Extra-mesorectal Lymphadenopathy: No Other: Trace free pelvic fluid. Small bilateral hydroceles. Normal urinary bladder and prostate. Resolution of upstream bowel obstruction. Proximal sigmoid colonic wall thickening could be due to underdistention but is indeterminate. Scattered colonic diverticula. IMPRESSION: Rectal adenocarcinoma T stage: T3b Rectal adenocarcinoma N stage:  0 Distance from tumor to the internal anal sphincter is 9.6 cm. Electronically Signed   By: Abigail Miyamoto M.D.   On: 08/10/2022 08:30   CT Chest W Contrast  Result Date: 08/08/2022 CLINICAL DATA:  76 year old male with history of new diagnosis of rectal cancer. Staging examination. * Tracking Code: BO * EXAM: CT CHEST WITH CONTRAST TECHNIQUE: Multidetector CT imaging of the chest was performed during intravenous contrast administration. RADIATION DOSE REDUCTION: This exam was performed according to the departmental dose-optimization program which includes automated exposure control, adjustment of the mA and/or kV according to patient size and/or use of iterative reconstruction technique. CONTRAST:  4m OMNIPAQUE IOHEXOL 300 MG/ML  SOLN  COMPARISON:  Chest CT 12/14/2014. FINDINGS: Cardiovascular: Heart size is normal. There is no significant pericardial fluid, thickening or pericardial calcification. There is aortic atherosclerosis, as well as atherosclerosis of the great vessels of the mediastinum and the coronary arteries, including calcified atherosclerotic plaque in the left main, left anterior descending, left circumflex and right coronary arteries. Mediastinum/Nodes: No pathologically enlarged mediastinal or hilar lymph nodes. Esophagus is unremarkable in appearance. No axillary lymphadenopathy. Lungs/Pleura: Some patchy areas of very mild ground-glass attenuation, septal thickening and centrilobular ground-glass attenuation micro nodularity are noted in the right lower lobe. No other larger more suspicious appearing pulmonary nodules or masses are  noted. No acute consolidative airspace disease. No pleural effusions. Mild diffuse bronchial wall thickening with mild centrilobular and paraseptal emphysema. Upper Abdomen: Aortic atherosclerosis. Left upper quadrant colostomy incompletely imaged. Musculoskeletal: There are no aggressive appearing lytic or blastic lesions noted in the visualized portions of the skeleton. Orthopedic fixation hardware in the lower cervical spine incidentally noted. IMPRESSION: 1. No definitive findings to suggest metastatic disease to the thorax. 2. There are some nonspecific findings in the right lower lobe which likely reflect resolving infection. Repeat noncontrast chest CT should be considered in 12 months to ensure the stability or regression of these findings. 3. Mild diffuse bronchial wall thickening with mild centrilobular and paraseptal emphysema; imaging findings suggestive of underlying COPD. 4. Aortic atherosclerosis, in addition to left main and three-vessel coronary artery disease. Assessment for potential risk factor modification, dietary therapy or pharmacologic therapy may be warranted, if clinically  indicated. Aortic Atherosclerosis (ICD10-I70.0) and Emphysema (ICD10-J43.9). Electronically Signed   By: Vinnie Langton M.D.   On: 08/08/2022 12:32    ASSESSMENT: Stage IIa rectal adenocarcinoma.  PLAN:    Stage IIa rectal adenocarcinoma: Diagnosis confirmed by imaging and biopsy.  MRI and CT scan results reviewed independently and reported as above confirming stage of disease.  Patient will benefit from neoadjuvant FOLFOX every 2 weeks x 8 cycles followed by concurrent Xeloda and daily XRT.  Finally, patient will benefit from definitive surgical intervention of his residual malignancy.  Unclear if colostomy is reversible.  Patient has now had port placement.  Patient tolerated cycle 1 of FOLFOX last week without significant side effects.  Return to clinic in 1 week for further evaluation and consideration of cycle 2.   Abdominal pain: Improved with diverting colostomy. Anemia: Chronic and unchanged.  Patient's hemoglobin is 8.3 today.  Consider transfusion if it continues to trend down.   Patient expressed understanding and was in agreement with this plan. He also understands that He can call clinic at any time with any questions, concerns, or complaints.    Cancer Staging  Rectal adenocarcinoma [facility]) Staging form: Colon and Rectum, AJCC 8th Edition - Clinical stage from 08/13/2022: Stage IIA (cT3, cN0, cM0) - Signed by Lloyd Huger, MD on 08/13/2022 Total positive nodes: 0  Lloyd Huger, MD   09/01/2022 11:30 AM

## 2022-09-02 DIAGNOSIS — E1159 Type 2 diabetes mellitus with other circulatory complications: Secondary | ICD-10-CM | POA: Diagnosis not present

## 2022-09-02 DIAGNOSIS — E1169 Type 2 diabetes mellitus with other specified complication: Secondary | ICD-10-CM | POA: Diagnosis not present

## 2022-09-02 DIAGNOSIS — E1142 Type 2 diabetes mellitus with diabetic polyneuropathy: Secondary | ICD-10-CM | POA: Diagnosis not present

## 2022-09-02 DIAGNOSIS — I152 Hypertension secondary to endocrine disorders: Secondary | ICD-10-CM | POA: Diagnosis not present

## 2022-09-02 DIAGNOSIS — E785 Hyperlipidemia, unspecified: Secondary | ICD-10-CM | POA: Diagnosis not present

## 2022-09-03 ENCOUNTER — Telehealth: Payer: Self-pay | Admitting: Oncology

## 2022-09-03 DIAGNOSIS — F17210 Nicotine dependence, cigarettes, uncomplicated: Secondary | ICD-10-CM | POA: Diagnosis not present

## 2022-09-03 DIAGNOSIS — I2510 Atherosclerotic heart disease of native coronary artery without angina pectoris: Secondary | ICD-10-CM | POA: Diagnosis not present

## 2022-09-03 DIAGNOSIS — E119 Type 2 diabetes mellitus without complications: Secondary | ICD-10-CM | POA: Diagnosis not present

## 2022-09-03 DIAGNOSIS — I10 Essential (primary) hypertension: Secondary | ICD-10-CM | POA: Diagnosis not present

## 2022-09-03 DIAGNOSIS — C19 Malignant neoplasm of rectosigmoid junction: Secondary | ICD-10-CM | POA: Diagnosis not present

## 2022-09-03 DIAGNOSIS — Z87442 Personal history of urinary calculi: Secondary | ICD-10-CM | POA: Diagnosis not present

## 2022-09-03 DIAGNOSIS — E785 Hyperlipidemia, unspecified: Secondary | ICD-10-CM | POA: Diagnosis not present

## 2022-09-03 DIAGNOSIS — R001 Bradycardia, unspecified: Secondary | ICD-10-CM | POA: Diagnosis not present

## 2022-09-03 DIAGNOSIS — Z483 Aftercare following surgery for neoplasm: Secondary | ICD-10-CM | POA: Diagnosis not present

## 2022-09-03 NOTE — Telephone Encounter (Signed)
Pt home health nurse, Cecille Rubin,  called and stated the pt has a sore in his mouth and may need magic mouthwash. Nurse said we could just call the pt with info regarding mouthwash

## 2022-09-04 ENCOUNTER — Encounter: Payer: Self-pay | Admitting: Oncology

## 2022-09-04 NOTE — Telephone Encounter (Signed)
Called patient to let him know I will send the magic mouthwash to [place] for him. Patient states his mouth is feeling better now and he does not feel like he needs it. He wants to hold off on the rx for now and will call back if he feels like he needs it.

## 2022-09-08 ENCOUNTER — Other Ambulatory Visit: Payer: Self-pay

## 2022-09-08 ENCOUNTER — Inpatient Hospital Stay: Payer: Medicare HMO

## 2022-09-08 ENCOUNTER — Encounter: Payer: Self-pay | Admitting: Oncology

## 2022-09-08 ENCOUNTER — Inpatient Hospital Stay (HOSPITAL_BASED_OUTPATIENT_CLINIC_OR_DEPARTMENT_OTHER): Payer: Medicare HMO | Admitting: Oncology

## 2022-09-08 DIAGNOSIS — C20 Malignant neoplasm of rectum: Secondary | ICD-10-CM

## 2022-09-08 DIAGNOSIS — D72819 Decreased white blood cell count, unspecified: Secondary | ICD-10-CM | POA: Diagnosis not present

## 2022-09-08 DIAGNOSIS — K219 Gastro-esophageal reflux disease without esophagitis: Secondary | ICD-10-CM | POA: Diagnosis not present

## 2022-09-08 DIAGNOSIS — E119 Type 2 diabetes mellitus without complications: Secondary | ICD-10-CM | POA: Diagnosis not present

## 2022-09-08 DIAGNOSIS — I2510 Atherosclerotic heart disease of native coronary artery without angina pectoris: Secondary | ICD-10-CM | POA: Diagnosis not present

## 2022-09-08 DIAGNOSIS — Z5111 Encounter for antineoplastic chemotherapy: Secondary | ICD-10-CM | POA: Diagnosis not present

## 2022-09-08 DIAGNOSIS — E785 Hyperlipidemia, unspecified: Secondary | ICD-10-CM | POA: Diagnosis not present

## 2022-09-08 DIAGNOSIS — R109 Unspecified abdominal pain: Secondary | ICD-10-CM | POA: Diagnosis not present

## 2022-09-08 DIAGNOSIS — D649 Anemia, unspecified: Secondary | ICD-10-CM | POA: Diagnosis not present

## 2022-09-08 LAB — CBC WITH DIFFERENTIAL/PLATELET
Abs Immature Granulocytes: 0.01 10*3/uL (ref 0.00–0.07)
Basophils Absolute: 0 10*3/uL (ref 0.0–0.1)
Basophils Relative: 1 %
Eosinophils Absolute: 0.2 10*3/uL (ref 0.0–0.5)
Eosinophils Relative: 5 %
HCT: 26.1 % — ABNORMAL LOW (ref 39.0–52.0)
Hemoglobin: 7.9 g/dL — ABNORMAL LOW (ref 13.0–17.0)
Immature Granulocytes: 0 %
Lymphocytes Relative: 27 %
Lymphs Abs: 1.1 10*3/uL (ref 0.7–4.0)
MCH: 24.8 pg — ABNORMAL LOW (ref 26.0–34.0)
MCHC: 30.3 g/dL (ref 30.0–36.0)
MCV: 82.1 fL (ref 80.0–100.0)
Monocytes Absolute: 0.3 10*3/uL (ref 0.1–1.0)
Monocytes Relative: 8 %
Neutro Abs: 2.3 10*3/uL (ref 1.7–7.7)
Neutrophils Relative %: 59 %
Platelets: 149 10*3/uL — ABNORMAL LOW (ref 150–400)
RBC: 3.18 MIL/uL — ABNORMAL LOW (ref 4.22–5.81)
RDW: 16.5 % — ABNORMAL HIGH (ref 11.5–15.5)
WBC: 3.9 10*3/uL — ABNORMAL LOW (ref 4.0–10.5)
nRBC: 0 % (ref 0.0–0.2)

## 2022-09-08 LAB — COMPREHENSIVE METABOLIC PANEL
ALT: 13 U/L (ref 0–44)
AST: 22 U/L (ref 15–41)
Albumin: 3.7 g/dL (ref 3.5–5.0)
Alkaline Phosphatase: 64 U/L (ref 38–126)
Anion gap: 7 (ref 5–15)
BUN: 19 mg/dL (ref 8–23)
CO2: 23 mmol/L (ref 22–32)
Calcium: 9.1 mg/dL (ref 8.9–10.3)
Chloride: 107 mmol/L (ref 98–111)
Creatinine, Ser: 1.01 mg/dL (ref 0.61–1.24)
GFR, Estimated: >60 mL/min (ref >60)
Glucose, Bld: 193 mg/dL — ABNORMAL HIGH (ref 70–99)
Potassium: 3.9 mmol/L (ref 3.5–5.1)
Sodium: 137 mmol/L (ref 135–145)
Total Bilirubin: 0.5 mg/dL (ref 0.3–1.2)
Total Protein: 6.7 g/dL (ref 6.5–8.1)

## 2022-09-08 LAB — PREPARE RBC (CROSSMATCH)

## 2022-09-08 MED ORDER — SODIUM CHLORIDE 0.9% FLUSH
10.0000 mL | INTRAVENOUS | Status: DC | PRN
  Administered 2022-09-08: 10 mL via INTRAVENOUS
  Filled 2022-09-08: qty 10

## 2022-09-08 MED ORDER — LEUCOVORIN CALCIUM INJECTION 350 MG
800.0000 mg | Freq: Once | INTRAVENOUS | Status: AC
  Administered 2022-09-08: 800 mg via INTRAVENOUS
  Filled 2022-09-08: qty 40

## 2022-09-08 MED ORDER — PALONOSETRON HCL INJECTION 0.25 MG/5ML
0.2500 mg | Freq: Once | INTRAVENOUS | Status: AC
  Administered 2022-09-08: 0.25 mg via INTRAVENOUS
  Filled 2022-09-08: qty 5

## 2022-09-08 MED ORDER — SODIUM CHLORIDE 0.9 % IV SOLN
10.0000 mg | Freq: Once | INTRAVENOUS | Status: AC
  Administered 2022-09-08: 10 mg via INTRAVENOUS
  Filled 2022-09-08: qty 10

## 2022-09-08 MED ORDER — OXALIPLATIN CHEMO INJECTION 100 MG/20ML
85.0000 mg/m2 | Freq: Once | INTRAVENOUS | Status: AC
  Administered 2022-09-08: 175 mg via INTRAVENOUS
  Filled 2022-09-08: qty 35

## 2022-09-08 MED ORDER — FLUOROURACIL CHEMO INJECTION 2.5 GM/50ML
400.0000 mg/m2 | Freq: Once | INTRAVENOUS | Status: AC
  Administered 2022-09-08: 800 mg via INTRAVENOUS
  Filled 2022-09-08: qty 16

## 2022-09-08 MED ORDER — SODIUM CHLORIDE 0.9 % IV SOLN
2400.0000 mg/m2 | INTRAVENOUS | Status: DC
  Administered 2022-09-08: 4850 mg via INTRAVENOUS
  Filled 2022-09-08: qty 97

## 2022-09-08 MED ORDER — DEXTROSE 5 % IV SOLN
Freq: Once | INTRAVENOUS | Status: AC
  Filled 2022-09-08: qty 250

## 2022-09-08 NOTE — Patient Instructions (Signed)
[facility] [place]  Discharge Instructions: Thank you for choosing [facility] to provide your oncology and hematology care.  If you have a lab appointment with the [facility], please go directly to the [facility] and check in at the registration area.  Wear comfortable clothing and clothing appropriate for easy access to any Portacath or PICC line.   We strive to give you quality time with your provider. You may need to reschedule your appointment if you arrive late (15 or more minutes).  Arriving late affects you and other patients whose appointments are after yours.  Also, if you miss three or more appointments without notifying the office, you may be dismissed from the clinic at the provider's discretion.      For prescription refill requests, have your pharmacy contact our office and allow 72 hours for refills to be completed.    Today you received the following chemotherapy and/or immunotherapy agents Oxaliplatin, Leucovorin and Adrucil.      To help prevent nausea and vomiting after your treatment, we encourage you to take your nausea medication as directed.  BELOW ARE SYMPTOMS THAT SHOULD BE REPORTED IMMEDIATELY: *FEVER GREATER THAN 100.4 F (38 C) OR HIGHER *CHILLS OR SWEATING *NAUSEA AND VOMITING THAT IS NOT CONTROLLED WITH YOUR NAUSEA MEDICATION *UNUSUAL SHORTNESS OF BREATH *UNUSUAL BRUISING OR BLEEDING *URINARY PROBLEMS (pain or burning when urinating, or frequent urination) *BOWEL PROBLEMS (unusual diarrhea, constipation, pain near the anus) TENDERNESS IN MOUTH AND THROAT WITH OR WITHOUT PRESENCE OF ULCERS (sore throat, sores in mouth, or a toothache) UNUSUAL RASH, SWELLING OR PAIN  UNUSUAL VAGINAL DISCHARGE OR ITCHING   Items with * indicate a potential emergency and should be followed up as soon as possible or go to the Emergency Department if any problems should occur.  Please show the CHEMOTHERAPY ALERT CARD or IMMUNOTHERAPY  ALERT CARD at check-in to the Emergency Department and triage nurse.  Should you have questions after your visit or need to cancel or reschedule your appointment, please contact [facility] [place] [facility] [place]  [phone] and follow the prompts.  Office hours are 8:00 a.m. to 4:30 p.m. Monday - Friday. Please note that voicemails left after 4:00 p.m. may not be returned until the following business day.  We are closed weekends and major holidays. You have access to a nurse at all times for urgent questions. Please call the main number to the clinic [phone] and follow the prompts.  For any non-urgent questions, you may also contact your provider using MyChart. We now offer e-Visits for anyone 28 and older to request care online for non-urgent symptoms. For details visit [email or web address].   Also download the MyChart app! Go to the app store, search "MyChart", open the app, select , and log in with your MyChart username and password.  Masks are optional in the cancer centers. If you would like for your care team to wear a mask while they are taking care of you, please let them know. For doctor visits, patients may have with them one support person who is at least 74 years old. At this time, visitors are not allowed in the infusion area.

## 2022-09-08 NOTE — Progress Notes (Signed)
[place]  [phone]) [phone] Fax:(336) [phone]  ID: [name] [name] OB: 1946-04-14  MR#: [id number]  CVE#:[id number]  Patient Care Team: [facility] as PCP - General [name], [name] November, MD as PCP - Cardiology (Cardiology) [name], MD as Consulting Physician (Cardiology) [name], RN as Oncology Nurse Navigator  CHIEF COMPLAINT: Stage IIa rectal adenocarcinoma.  INTERVAL HISTORY: Patient returns to clinic today for further evaluation and consideration of cycle 2 of 8 of neoadjuvant FOLFOX.  He has increased fatigue, but otherwise feels well.  He is tolerating his treatments without significant side effects.  He has no neurologic complaints.  He denies any recent fevers or illnesses.  He has no chest pain, shortness of breath, cough, or hemoptysis.  He denies any nausea, vomiting, constipation, or diarrhea.  He does not report any melena or hematochezia.  He has no urinary complaints.  Patient offers no further specific complaints today.  REVIEW OF SYSTEMS:   Review of Systems  Constitutional:  Positive for malaise/fatigue. Negative for fever and weight loss.  Respiratory: Negative.  Negative for cough, hemoptysis and shortness of breath.   Cardiovascular: Negative.  Negative for chest pain and leg swelling.  Gastrointestinal: Negative.  Negative for abdominal pain.  Genitourinary: Negative.  Negative for dysuria.  Musculoskeletal: Negative.  Negative for back pain.  Skin: Negative.  Negative for rash.  Neurological: Negative.  Negative for dizziness, focal weakness, weakness and headaches.  Psychiatric/Behavioral: Negative.  The patient is not nervous/anxious.     As per HPI. Otherwise, a complete review of systems is negative.  PAST MEDICAL HISTORY: Past Medical History:  Diagnosis Date   Anemia    Anginal pain (HCC)    Bradycardia    Cancer (HCC)    Coronary artery disease    x 1 stent   Depression     Diabetes mellitus without complication (HCC)    GERD (gastroesophageal reflux disease)    History of kidney stones    Hyperlipidemia    Hypertension    Nephrolithiasis     PAST SURGICAL HISTORY: Past Surgical History:  Procedure Laterality Date   CARDIAC CATHETERIZATION     CATARACT EXTRACTION     CORONARY ANGIOPLASTY     FLEXIBLE SIGMOIDOSCOPY N/A 07/27/2022   Procedure: FLEXIBLE SIGMOIDOSCOPY;  Surgeon: Lesly Rubenstein, MD;  Location: [facility] END[facility];  Service: Endoscopy;  Laterality: N/A;   KIDNEY STONE SURGERY     NECK SURGERY  12/18/2011   nephrolithiasis     PORTACATH PLACEMENT Right 08/24/2022   Procedure: INSERTION PORT-A-CATH;  Surgeon: Ronny Bacon, MD;  Location: [facility] ORS;  Service: General;  Laterality: Right;   stent (other)     TRANSVERSE LOOP COLOSTOMY N/A 07/28/2022   Procedure: TRANSVERSE LOOP COLOSTOMY;  Surgeon: Ronny Bacon, MD;  Location: [facility] ORS;  Service: General;  Laterality: N/A;    FAMILY HISTORY: Family History  Problem Relation Age of Onset   Heart disease Father     ADVANCED DIRECTIVES (Y/N):  N  HEALTH MAINTENANCE: Social History   Tobacco Use   Smoking status: Every Day    Packs/day: 0.50    Years: 36.00    Total pack years: 18.00    Types: Cigarettes   Smokeless tobacco: Never  Vaping Use   Vaping Use: Never used  Substance Use Topics   Alcohol use: No   Drug use: No     Colonoscopy:  PAP:  Bone density:  Lipid panel:  No Known Allergies  Current Outpatient  Medications  Medication Sig Dispense Refill   aspirin 81 MG EC tablet Take 81 mg by mouth daily.       atorvastatin (LIPITOR) 40 MG tablet Take 1 tablet (40 mg total) by mouth daily. 90 tablet 3   cyanocobalamin (VITAMIN B12) 500 MCG tablet Take 500 mcg by mouth daily.     famotidine (PEPCID) 40 MG tablet Take 1 tablet (40 mg total) by mouth daily. 90 tablet 3   glimepiride (AMARYL) 4 MG tablet Take 4 mg by mouth daily with breakfast.      HYDROcodone-acetaminophen (NORCO/VICODIN) 5-325 MG tablet Take 1 tablet by mouth every 6 (six) hours as needed for moderate pain.     ibuprofen (ADVIL) 800 MG tablet Take 1 tablet (800 mg total) by mouth every 8 (eight) hours as needed. 30 tablet 0   isosorbide mononitrate (IMDUR) 30 MG 24 hr tablet Take 1 tablet (30 mg total) by mouth daily. 90 tablet 3   lidocaine-prilocaine (EMLA) cream Apply to affected area once 30 g 3   metFORMIN (GLUCOPHAGE) 1000 MG tablet Take 1,000 mg by mouth 2 (two) times daily with a meal.     nitroGLYCERIN (NITROSTAT) 0.4 MG SL tablet Place 1 tablet (0.4 mg total) under the tongue every 5 (five) minutes as needed for chest pain. 25 tablet 6   ondansetron (ZOFRAN) 8 MG tablet Take 1 tablet (8 mg total) by mouth every 8 (eight) hours as needed for nausea or vomiting. 60 tablet 2   pioglitazone (ACTOS) 15 MG tablet Take 15 mg by mouth daily.     prochlorperazine (COMPAZINE) 10 MG tablet Take 1 tablet (10 mg total) by mouth every 6 (six) hours as needed for nausea or vomiting. 60 tablet 2   clopidogrel (PLAVIX) 75 MG tablet Take 75 mg by mouth daily. (Patient not taking: Reported on 09/01/2022)     glimepiride (AMARYL) 4 MG tablet Take 4 mg by mouth daily with breakfast.     metFORMIN (GLUCOPHAGE) 1000 MG tablet Take 1,000 mg by mouth 2 (two) times daily with a meal.      No current facility-administered medications for this visit.    OBJECTIVE: Vitals:   09/08/22 0853  BP: 129/75  Pulse: 71  Resp: 16  Temp: (!) 96.4 F (35.8 C)  SpO2: 100%     Body mass index is 24.75 kg/m.    ECOG FS:0 - Asymptomatic  General: Well-developed, well-nourished, no acute distress. Eyes: Pink conjunctiva, anicteric sclera. HEENT: Normocephalic, moist mucous membranes. Lungs: No audible wheezing or coughing. Heart: Regular rate and rhythm. Abdomen: Soft, nontender, no obvious distention. Musculoskeletal: No edema, cyanosis, or clubbing. Neuro: Alert, answering all questions  appropriately. Cranial nerves grossly intact. Skin: No rashes or petechiae noted. Psych: Normal affect.   LAB RESULTS:  Lab Results  Component Value Date   NA 137 09/08/2022   K 3.9 09/08/2022   CL 107 09/08/2022   CO2 23 09/08/2022   GLUCOSE 193 (H) 09/08/2022   BUN 19 09/08/2022   CREATININE 1.01 09/08/2022   CALCIUM 9.1 09/08/2022   PROT 6.7 09/08/2022   ALBUMIN 3.7 09/08/2022   AST 22 09/08/2022   ALT 13 09/08/2022   ALKPHOS 64 09/08/2022   BILITOT 0.5 09/08/2022   GFRNONAA >60 09/08/2022   GFRAA >60 12/20/2014    Lab Results  Component Value Date   WBC 3.9 (L) 09/08/2022   NEUTROABS 2.3 09/08/2022   HGB 7.9 (L) 09/08/2022   HCT 26.1 (L) 09/08/2022   MCV 82.1  09/08/2022   PLT 149 (L) 09/08/2022     STUDIES: DG Chest Port 1 View  Result Date: 08/24/2022 CLINICAL DATA:  Port-A-Cath in place. EXAM: PORTABLE CHEST 1 VIEW COMPARISON:  Chest CT 08/07/2022 FINDINGS: Right chest port with tip in the mid SVC. No pneumothorax. The heart is normal in size with normal mediastinal contours. Aortic atherosclerosis. No focal airspace disease, the ill-defined opacities in the right lower lobe on prior CT are not seen by radiograph. No pleural fluid. Stable osseous structures. IMPRESSION: Right chest port with tip in the mid SVC. No pneumothorax. Electronically Signed   By: Keith Rake M.D.   On: 08/24/2022 15:36   DG C-Arm 1-60 Min-No Report  Result Date: 08/24/2022 Fluoroscopy was utilized by the requesting physician.  No radiographic interpretation.    ASSESSMENT: Stage IIa rectal adenocarcinoma.  PLAN:    Stage IIa rectal adenocarcinoma: Diagnosis confirmed by imaging and biopsy.  MRI and CT scan results reviewed independently and reported as above confirming stage of disease.  Patient will benefit from neoadjuvant FOLFOX every 2 weeks x 8 cycles followed by concurrent Xeloda and daily XRT.  Finally, patient will benefit from definitive surgical intervention of his  residual malignancy.  Unclear if colostomy is reversible.  Patient has now had port placement.  Proceed with cycle 2 of FOLFOX today.  Return to clinic in 2 days for pump removal and 1 unit of packed red blood cells.  Patient will then return to clinic in 2 weeks for further evaluation and consideration of cycle 3.   Abdominal pain: Improved with diverting colostomy. Anemia: Anemia has trended down to 7.9.  1 unit of blood with pump off on Thursday as above. Leukopenia: Mild, monitor.  Proceed with treatment as above.   Patient expressed understanding and was in agreement with this plan. He also understands that He can call clinic at any time with any questions, concerns, or complaints.    Cancer Staging  Rectal adenocarcinoma [facility]) Staging form: Colon and Rectum, AJCC 8th Edition - Clinical stage from 08/13/2022: Stage IIA (cT3, cN0, cM0) - Signed by Lloyd Huger, MD on 08/13/2022 Total positive nodes: 0  Lloyd Huger, MD   09/08/2022 9:52 AM

## 2022-09-09 LAB — ABO/RH: ABO/RH(D): A POS

## 2022-09-10 ENCOUNTER — Inpatient Hospital Stay: Payer: Medicare HMO

## 2022-09-10 DIAGNOSIS — D649 Anemia, unspecified: Secondary | ICD-10-CM | POA: Diagnosis not present

## 2022-09-10 DIAGNOSIS — K219 Gastro-esophageal reflux disease without esophagitis: Secondary | ICD-10-CM | POA: Diagnosis not present

## 2022-09-10 DIAGNOSIS — D72819 Decreased white blood cell count, unspecified: Secondary | ICD-10-CM | POA: Diagnosis not present

## 2022-09-10 DIAGNOSIS — E785 Hyperlipidemia, unspecified: Secondary | ICD-10-CM | POA: Diagnosis not present

## 2022-09-10 DIAGNOSIS — Z5111 Encounter for antineoplastic chemotherapy: Secondary | ICD-10-CM | POA: Diagnosis not present

## 2022-09-10 DIAGNOSIS — C20 Malignant neoplasm of rectum: Secondary | ICD-10-CM

## 2022-09-10 DIAGNOSIS — R109 Unspecified abdominal pain: Secondary | ICD-10-CM | POA: Diagnosis not present

## 2022-09-10 DIAGNOSIS — E119 Type 2 diabetes mellitus without complications: Secondary | ICD-10-CM | POA: Diagnosis not present

## 2022-09-10 DIAGNOSIS — I2510 Atherosclerotic heart disease of native coronary artery without angina pectoris: Secondary | ICD-10-CM | POA: Diagnosis not present

## 2022-09-10 MED ORDER — DIPHENHYDRAMINE HCL 50 MG/ML IJ SOLN
25.0000 mg | Freq: Once | INTRAMUSCULAR | Status: AC
  Administered 2022-09-10: 25 mg via INTRAVENOUS
  Filled 2022-09-10: qty 1

## 2022-09-10 MED ORDER — HEPARIN SOD (PORK) LOCK FLUSH 100 UNIT/ML IV SOLN
INTRAVENOUS | Status: AC
  Filled 2022-09-10: qty 5

## 2022-09-10 MED ORDER — ACETAMINOPHEN 325 MG PO TABS
650.0000 mg | ORAL_TABLET | Freq: Once | ORAL | Status: AC
  Administered 2022-09-10: 650 mg via ORAL
  Filled 2022-09-10: qty 2

## 2022-09-10 MED ORDER — HEPARIN SOD (PORK) LOCK FLUSH 100 UNIT/ML IV SOLN
500.0000 [IU] | Freq: Every day | INTRAVENOUS | Status: AC | PRN
  Administered 2022-09-10: 500 [IU]
  Filled 2022-09-10: qty 5

## 2022-09-10 MED ORDER — SODIUM CHLORIDE 0.9% IV SOLUTION
250.0000 mL | Freq: Once | INTRAVENOUS | Status: AC
  Administered 2022-09-10: 250 mL via INTRAVENOUS
  Filled 2022-09-10: qty 250

## 2022-09-10 NOTE — Patient Instructions (Signed)
Blood Transfusion, Adult A blood transfusion is a procedure in which you receive blood through an IV tube. You may need this procedure because of: A bleeding disorder. An illness. An injury. A surgery. The blood may come from someone else (a donor). You may also be able to donate blood for yourself before a surgery. The blood given in a transfusion may be made up of different types of cells. You may get: Red blood cells. These carry oxygen to the cells in the body. Platelets. These help your blood to clot. Plasma. This is the liquid part of your blood. It carries proteins and other substances through the body. White blood cells. These help you fight infections. If you have a clotting disorder, you may also get other types of blood products. Depending on the type of blood product, this procedure may take 1-4 hours to complete. Tell your doctor about: Any bleeding problems you have. Any reactions you have had during a blood transfusion in the past. Any allergies you have. All medicines you are taking, including vitamins, herbs, eye drops, creams, and over-the-counter medicines. Any surgeries you have had. Any medical conditions you have. Whether you are pregnant or may be pregnant. What are the risks? Talk with your health care provider about risks. The most common problems include: A mild allergic reaction. This includes red, swollen areas of skin (hives) and itching. Fever or chills. This may be the body's response to new blood cells received. This may happen during or up to 4 hours after the transfusion. More serious problems may include: A serious allergic reaction. This includes breathing trouble or swelling around the face and lips. Too much fluid in the lungs. This may cause breathing problems. Lung injury. This causes breathing trouble and low oxygen in the blood. This can happen within hours of the transfusion or days later. Too much iron. This can happen after getting many blood  transfusions over a period of time. An infection or virus passed through the blood. This is rare. Donated blood is carefully tested before it is given. Your body's defense system (immune system) trying to attack the new blood cells. This is rare. Symptoms may include fever, chills, nausea, low blood pressure, and low back or chest pain. Donated cells attacking healthy tissues. This is rare. What happens before the procedure? You will have a blood test to find out your blood type. The test also finds out what type of blood your body will accept and matches it to the donor type. If you are going to have a planned surgery, you may be able to donate your own blood. This may be done in case you need a transfusion. You will have your temperature, blood pressure, and pulse checked. You may receive medicine to help prevent an allergic reaction. This may be done if you have had a reaction to a transfusion before. This medicine may be given to you by mouth or through an IV tube. What happens during the procedure?  An IV tube will be put into one of your veins. The bag of blood will be attached to your IV tube. Then, the blood will enter through your vein. Your temperature, blood pressure, and pulse will be checked often. This is done to find early signs of a transfusion reaction. Tell your nurse right away if you have any of these symptoms: Shortness of breath or trouble breathing. Chest or back pain. Fever or chills. Red, swollen areas of skin or itching. If you have any signs   or symptoms of a reaction, your transfusion will be stopped. You may also be given medicine. When the transfusion is finished, your IV tube will be taken out. Pressure may be put on the IV site for a few minutes. A bandage (dressing) will be put on the IV site. The procedure may vary among doctors and hospitals. What happens after the procedure? You will be monitored until you leave the hospital or clinic. This includes  checking your temperature, blood pressure, pulse, breathing rate, and blood oxygen level. Your blood may be tested to see how you have responded to the transfusion. You may be warmed with fluids or blankets. This is done to keep the temperature of your body normal. If you have your procedure in an outpatient setting, you will be told whom to contact to report any reactions. Where to find more information Visit the [employer]: redcross.org Summary A blood transfusion is a procedure in which you receive blood through an IV tube. The blood you are given may be made up of different blood cells. You may receive red blood cells, platelets, plasma, or white blood cells. Your temperature, blood pressure, and pulse will be checked often. After the procedure, your blood may be tested to see how you have responded. This information is not intended to replace advice given to you by your health care provider. Make sure you discuss any questions you have with your health care provider. Document Revised: 01/30/2022 Document Reviewed: 01/30/2022 Elsevier Patient Education  2023 Elsevier Inc.  

## 2022-09-11 LAB — TYPE AND SCREEN
ABO/RH(D): A POS
Antibody Screen: NEGATIVE
Unit division: 0

## 2022-09-11 LAB — BPAM RBC
Blood Product Expiration Date: 202311242359
ISSUE DATE / TIME: 202310261332
Unit Type and Rh: 6200

## 2022-09-22 ENCOUNTER — Inpatient Hospital Stay: Payer: Medicare HMO | Attending: Oncology

## 2022-09-22 ENCOUNTER — Inpatient Hospital Stay: Payer: Medicare HMO

## 2022-09-22 ENCOUNTER — Inpatient Hospital Stay: Payer: Medicare HMO | Admitting: Oncology

## 2022-09-22 ENCOUNTER — Encounter: Payer: Self-pay | Admitting: Oncology

## 2022-09-22 VITALS — BP 151/67 | HR 59 | Temp 98.0°F | Resp 16 | Ht 72.0 in | Wt 182.0 lb

## 2022-09-22 DIAGNOSIS — E119 Type 2 diabetes mellitus without complications: Secondary | ICD-10-CM | POA: Diagnosis not present

## 2022-09-22 DIAGNOSIS — R5383 Other fatigue: Secondary | ICD-10-CM | POA: Diagnosis not present

## 2022-09-22 DIAGNOSIS — Z5189 Encounter for other specified aftercare: Secondary | ICD-10-CM | POA: Diagnosis not present

## 2022-09-22 DIAGNOSIS — E785 Hyperlipidemia, unspecified: Secondary | ICD-10-CM | POA: Diagnosis not present

## 2022-09-22 DIAGNOSIS — D696 Thrombocytopenia, unspecified: Secondary | ICD-10-CM | POA: Diagnosis not present

## 2022-09-22 DIAGNOSIS — F17210 Nicotine dependence, cigarettes, uncomplicated: Secondary | ICD-10-CM | POA: Insufficient documentation

## 2022-09-22 DIAGNOSIS — I10 Essential (primary) hypertension: Secondary | ICD-10-CM | POA: Diagnosis not present

## 2022-09-22 DIAGNOSIS — K219 Gastro-esophageal reflux disease without esophagitis: Secondary | ICD-10-CM | POA: Insufficient documentation

## 2022-09-22 DIAGNOSIS — C20 Malignant neoplasm of rectum: Secondary | ICD-10-CM

## 2022-09-22 DIAGNOSIS — D649 Anemia, unspecified: Secondary | ICD-10-CM | POA: Insufficient documentation

## 2022-09-22 DIAGNOSIS — Z87442 Personal history of urinary calculi: Secondary | ICD-10-CM | POA: Insufficient documentation

## 2022-09-22 DIAGNOSIS — Z7902 Long term (current) use of antithrombotics/antiplatelets: Secondary | ICD-10-CM | POA: Insufficient documentation

## 2022-09-22 DIAGNOSIS — Z79899 Other long term (current) drug therapy: Secondary | ICD-10-CM | POA: Diagnosis not present

## 2022-09-22 DIAGNOSIS — Z5111 Encounter for antineoplastic chemotherapy: Secondary | ICD-10-CM | POA: Insufficient documentation

## 2022-09-22 DIAGNOSIS — I700 Atherosclerosis of aorta: Secondary | ICD-10-CM | POA: Diagnosis not present

## 2022-09-22 DIAGNOSIS — Z7984 Long term (current) use of oral hypoglycemic drugs: Secondary | ICD-10-CM | POA: Diagnosis not present

## 2022-09-22 DIAGNOSIS — I2510 Atherosclerotic heart disease of native coronary artery without angina pectoris: Secondary | ICD-10-CM | POA: Insufficient documentation

## 2022-09-22 DIAGNOSIS — D709 Neutropenia, unspecified: Secondary | ICD-10-CM | POA: Insufficient documentation

## 2022-09-22 DIAGNOSIS — Z7982 Long term (current) use of aspirin: Secondary | ICD-10-CM | POA: Diagnosis not present

## 2022-09-22 LAB — CBC WITH DIFFERENTIAL/PLATELET
Abs Immature Granulocytes: 0.01 10*3/uL (ref 0.00–0.07)
Basophils Absolute: 0 10*3/uL (ref 0.0–0.1)
Basophils Relative: 1 %
Eosinophils Absolute: 0.1 10*3/uL (ref 0.0–0.5)
Eosinophils Relative: 3 %
HCT: 28.3 % — ABNORMAL LOW (ref 39.0–52.0)
Hemoglobin: 8.8 g/dL — ABNORMAL LOW (ref 13.0–17.0)
Immature Granulocytes: 0 %
Lymphocytes Relative: 43 %
Lymphs Abs: 1.1 10*3/uL (ref 0.7–4.0)
MCH: 25.7 pg — ABNORMAL LOW (ref 26.0–34.0)
MCHC: 31.1 g/dL (ref 30.0–36.0)
MCV: 82.5 fL (ref 80.0–100.0)
Monocytes Absolute: 0.4 10*3/uL (ref 0.1–1.0)
Monocytes Relative: 15 %
Neutro Abs: 0.9 10*3/uL — ABNORMAL LOW (ref 1.7–7.7)
Neutrophils Relative %: 38 %
Platelets: 119 10*3/uL — ABNORMAL LOW (ref 150–400)
RBC: 3.43 MIL/uL — ABNORMAL LOW (ref 4.22–5.81)
RDW: 18.2 % — ABNORMAL HIGH (ref 11.5–15.5)
WBC: 2.5 10*3/uL — ABNORMAL LOW (ref 4.0–10.5)
nRBC: 0 % (ref 0.0–0.2)

## 2022-09-22 LAB — COMPREHENSIVE METABOLIC PANEL
ALT: 22 U/L (ref 0–44)
AST: 25 U/L (ref 15–41)
Albumin: 3.7 g/dL (ref 3.5–5.0)
Alkaline Phosphatase: 62 U/L (ref 38–126)
Anion gap: 8 (ref 5–15)
BUN: 18 mg/dL (ref 8–23)
CO2: 23 mmol/L (ref 22–32)
Calcium: 8.9 mg/dL (ref 8.9–10.3)
Chloride: 107 mmol/L (ref 98–111)
Creatinine, Ser: 0.93 mg/dL (ref 0.61–1.24)
GFR, Estimated: >60 mL/min (ref >60)
Glucose, Bld: 110 mg/dL — ABNORMAL HIGH (ref 70–99)
Potassium: 3.7 mmol/L (ref 3.5–5.1)
Sodium: 138 mmol/L (ref 135–145)
Total Bilirubin: 0.5 mg/dL (ref 0.3–1.2)
Total Protein: 6.6 g/dL (ref 6.5–8.1)

## 2022-09-22 MED ORDER — SODIUM CHLORIDE 0.9 % IV SOLN
2400.0000 mg/m2 | INTRAVENOUS | Status: DC
  Administered 2022-09-22: 4850 mg via INTRAVENOUS
  Filled 2022-09-22: qty 97

## 2022-09-22 MED ORDER — SODIUM CHLORIDE 0.9 % IV SOLN
10.0000 mg | Freq: Once | INTRAVENOUS | Status: AC
  Administered 2022-09-22: 10 mg via INTRAVENOUS
  Filled 2022-09-22: qty 10

## 2022-09-22 MED ORDER — DEXTROSE 5 % IV SOLN
Freq: Once | INTRAVENOUS | Status: AC
  Filled 2022-09-22: qty 250

## 2022-09-22 MED ORDER — OXALIPLATIN CHEMO INJECTION 100 MG/20ML
85.0000 mg/m2 | Freq: Once | INTRAVENOUS | Status: AC
  Administered 2022-09-22: 175 mg via INTRAVENOUS
  Filled 2022-09-22: qty 35

## 2022-09-22 MED ORDER — SODIUM CHLORIDE 0.9% FLUSH
10.0000 mL | INTRAVENOUS | Status: DC | PRN
  Administered 2022-09-22: 10 mL via INTRAVENOUS
  Filled 2022-09-22: qty 10

## 2022-09-22 MED ORDER — PALONOSETRON HCL INJECTION 0.25 MG/5ML
0.2500 mg | Freq: Once | INTRAVENOUS | Status: AC
  Administered 2022-09-22: 0.25 mg via INTRAVENOUS
  Filled 2022-09-22: qty 5

## 2022-09-22 MED ORDER — LEUCOVORIN CALCIUM INJECTION 350 MG
800.0000 mg | Freq: Once | INTRAVENOUS | Status: AC
  Administered 2022-09-22: 800 mg via INTRAVENOUS
  Filled 2022-09-22: qty 40

## 2022-09-22 MED ORDER — FLUOROURACIL CHEMO INJECTION 2.5 GM/50ML
400.0000 mg/m2 | Freq: Once | INTRAVENOUS | Status: AC
  Administered 2022-09-22: 800 mg via INTRAVENOUS
  Filled 2022-09-22: qty 16

## 2022-09-22 NOTE — Progress Notes (Signed)
[place]  Telephone:(336) [phone] Fax:(336) [phone]  ID: [name] [name] OB: 01/04/1946  MR#: [id number]  AOZ#:[id number]  Patient Care Team: [facility] as PCP - General [name], [name] November, MD as PCP - Cardiology (Cardiology) [name], MD as Consulting Physician (Cardiology) [name], RN as Oncology Nurse Navigator  CHIEF COMPLAINT: Stage IIa rectal adenocarcinoma.  INTERVAL HISTORY: Patient returns to clinic today for further evaluation and consideration of cycle 3 of 8 of neoadjuvant FOLFOX.  He continues to have fatigue, but otherwise is tolerating his treatments well.  He has no neurologic complaints.  He denies any recent fevers or illnesses.  He has no chest pain, shortness of breath, cough, or hemoptysis.  He denies any nausea, vomiting, constipation, or diarrhea.  He does not report any melena or hematochezia.  He has no urinary complaints.  Patient offers no further specific complaints today.  REVIEW OF SYSTEMS:   Review of Systems  Constitutional:  Positive for malaise/fatigue. Negative for fever and weight loss.  Respiratory: Negative.  Negative for cough, hemoptysis and shortness of breath.   Cardiovascular: Negative.  Negative for chest pain and leg swelling.  Gastrointestinal: Negative.  Negative for abdominal pain.  Genitourinary: Negative.  Negative for dysuria.  Musculoskeletal: Negative.  Negative for back pain.  Skin: Negative.  Negative for rash.  Neurological: Negative.  Negative for dizziness, focal weakness, weakness and headaches.  Psychiatric/Behavioral: Negative.  The patient is not nervous/anxious.     As per HPI. Otherwise, a complete review of systems is negative.  PAST MEDICAL HISTORY: Past Medical History:  Diagnosis Date   Anemia    Anginal pain (HCC)    Bradycardia    Cancer (HCC)    Coronary artery disease    x 1 stent   Depression    Diabetes mellitus without complication (HCC)     GERD (gastroesophageal reflux disease)    History of kidney stones    Hyperlipidemia    Hypertension    Nephrolithiasis     PAST SURGICAL HISTORY: Past Surgical History:  Procedure Laterality Date   CARDIAC CATHETERIZATION     CATARACT EXTRACTION     CORONARY ANGIOPLASTY     FLEXIBLE SIGMOIDOSCOPY N/A 07/27/2022   Procedure: FLEXIBLE SIGMOIDOSCOPY;  Surgeon: Lesly Rubenstein, MD;  Location: [facility] END[facility]COPY;  Service: Endoscopy;  Laterality: N/A;   KIDNEY STONE SURGERY     NECK SURGERY  12/18/2011   nephrolithiasis     PORTACATH PLACEMENT Right 08/24/2022   Procedure: INSERTION PORT-A-CATH;  Surgeon: Ronny Bacon, MD;  Location: [facility] ORS;  Service: General;  Laterality: Right;   stent (other)     TRANSVERSE LOOP COLOSTOMY N/A 07/28/2022   Procedure: TRANSVERSE LOOP COLOSTOMY;  Surgeon: Ronny Bacon, MD;  Location: [facility] ORS;  Service: General;  Laterality: N/A;    FAMILY HISTORY: Family History  Problem Relation Age of Onset   Heart disease Father     ADVANCED DIRECTIVES (Y/N):  N  HEALTH MAINTENANCE: Social History   Tobacco Use   Smoking status: Every Day    Packs/day: 0.50    Years: 36.00    Total pack years: 18.00    Types: Cigarettes   Smokeless tobacco: Never  Vaping Use   Vaping Use: Never used  Substance Use Topics   Alcohol use: No   Drug use: No     Colonoscopy:  PAP:  Bone density:  Lipid panel:  No Known Allergies  Current Outpatient Medications  Medication Sig Dispense Refill  aspirin 81 MG EC tablet Take 81 mg by mouth daily.       atorvastatin (LIPITOR) 40 MG tablet Take 1 tablet (40 mg total) by mouth daily. 90 tablet 3   cyanocobalamin (VITAMIN B12) 500 MCG tablet Take 500 mcg by mouth daily.     famotidine (PEPCID) 40 MG tablet Take 1 tablet (40 mg total) by mouth daily. 90 tablet 3   glimepiride (AMARYL) 4 MG tablet Take 4 mg by mouth daily with breakfast.     glimepiride (AMARYL) 4 MG tablet Take 4 mg by mouth daily with  breakfast.     HYDROcodone-acetaminophen (NORCO/VICODIN) 5-325 MG tablet Take 1 tablet by mouth every 6 (six) hours as needed for moderate pain.     ibuprofen (ADVIL) 800 MG tablet Take 1 tablet (800 mg total) by mouth every 8 (eight) hours as needed. 30 tablet 0   isosorbide mononitrate (IMDUR) 30 MG 24 hr tablet Take 1 tablet (30 mg total) by mouth daily. 90 tablet 3   lidocaine-prilocaine (EMLA) cream Apply to affected area once 30 g 3   metFORMIN (GLUCOPHAGE) 1000 MG tablet Take 1,000 mg by mouth 2 (two) times daily with a meal.      metFORMIN (GLUCOPHAGE) 1000 MG tablet Take 1,000 mg by mouth 2 (two) times daily with a meal.     nitroGLYCERIN (NITROSTAT) 0.4 MG SL tablet Place 1 tablet (0.4 mg total) under the tongue every 5 (five) minutes as needed for chest pain. 25 tablet 6   ondansetron (ZOFRAN) 8 MG tablet Take 1 tablet (8 mg total) by mouth every 8 (eight) hours as needed for nausea or vomiting. 60 tablet 2   pioglitazone (ACTOS) 15 MG tablet Take 15 mg by mouth daily.     prochlorperazine (COMPAZINE) 10 MG tablet Take 1 tablet (10 mg total) by mouth every 6 (six) hours as needed for nausea or vomiting. 60 tablet 2   clopidogrel (PLAVIX) 75 MG tablet Take 75 mg by mouth daily. (Patient not taking: Reported on 09/01/2022)     No current facility-administered medications for this visit.   Facility-Administered Medications Ordered in Other Visits  Medication Dose Route Frequency Provider Last Rate Last Admin   fluorouracil (ADRUCIL) 4,850 mg in sodium chloride 0.9 % 53 mL chemo infusion  2,400 mg/m2 (Treatment Plan Recorded) Intravenous 1 day or 1 dose Grayland Ormond, [name] November, MD       fluorouracil (ADRUCIL) chemo injection 800 mg  400 mg/m2 (Treatment Plan Recorded) Intravenous Once Lloyd Huger, MD       sodium chloride flush (NS) 0.9 % injection 10 mL  10 mL Intravenous PRN Lloyd Huger, MD   10 mL at 09/22/22 0828    OBJECTIVE: Vitals:   09/22/22 0836  BP: (!) 151/67   Pulse: (!) 59  Resp: 16  Temp: 98 F (36.7 C)  SpO2: 100%     Body mass index is 24.68 kg/m.    ECOG FS:0 - Asymptomatic  General: Well-developed, well-nourished, no acute distress. Eyes: Pink conjunctiva, anicteric sclera. HEENT: Normocephalic, moist mucous membranes. Lungs: No audible wheezing or coughing. Heart: Regular rate and rhythm. Abdomen: Soft, nontender, no obvious distention. Musculoskeletal: No edema, cyanosis, or clubbing. Neuro: Alert, answering all questions appropriately. Cranial nerves grossly intact. Skin: No rashes or petechiae noted. Psych: Normal affect.  LAB RESULTS:  Lab Results  Component Value Date   NA 138 09/22/2022   K 3.7 09/22/2022   CL 107 09/22/2022   CO2 23 09/22/2022  GLUCOSE 110 (H) 09/22/2022   BUN 18 09/22/2022   CREATININE 0.93 09/22/2022   CALCIUM 8.9 09/22/2022   PROT 6.6 09/22/2022   ALBUMIN 3.7 09/22/2022   AST 25 09/22/2022   ALT 22 09/22/2022   ALKPHOS 62 09/22/2022   BILITOT 0.5 09/22/2022   GFRNONAA >60 09/22/2022   GFRAA >60 12/20/2014    Lab Results  Component Value Date   WBC 2.5 (L) 09/22/2022   NEUTROABS 0.9 (L) 09/22/2022   HGB 8.8 (L) 09/22/2022   HCT 28.3 (L) 09/22/2022   MCV 82.5 09/22/2022   PLT 119 (L) 09/22/2022     STUDIES: DG Chest Port 1 View  Result Date: 08/24/2022 CLINICAL DATA:  Port-A-Cath in place. EXAM: PORTABLE CHEST 1 VIEW COMPARISON:  Chest CT 08/07/2022 FINDINGS: Right chest port with tip in the mid SVC. No pneumothorax. The heart is normal in size with normal mediastinal contours. Aortic atherosclerosis. No focal airspace disease, the ill-defined opacities in the right lower lobe on prior CT are not seen by radiograph. No pleural fluid. Stable osseous structures. IMPRESSION: Right chest port with tip in the mid SVC. No pneumothorax. Electronically Signed   By: Keith Rake M.D.   On: 08/24/2022 15:36   DG C-Arm 1-60 Min-No Report  Result Date: 08/24/2022 Fluoroscopy was utilized  by the requesting physician.  No radiographic interpretation.    ASSESSMENT: Stage IIa rectal adenocarcinoma.  PLAN:    Stage IIa rectal adenocarcinoma: Diagnosis confirmed by imaging and biopsy.  MRI and CT scan results reviewed independently and reported as above confirming stage of disease.  Patient will benefit from neoadjuvant FOLFOX every 2 weeks x 8 cycles followed by concurrent Xeloda and daily XRT.  Finally, patient will benefit from definitive surgical intervention of his residual malignancy.  Unclear if colostomy is reversible.  Patient has now had port placement.  Proceed with cycle 3 of FOLFOX today despite mild neutropenia.  Return to clinic in 2 days for pump removal and Udenyca and then in 2 weeks for further evaluation and consideration of cycle 4.   Anemia: Hemoglobin improved to 8.8 with transfusion.  Monitor.   Neutropenia: Proceed with treatment as above.  Udenyca with pump off.   Thrombocytopenia: Mild, monitor.   Patient expressed understanding and was in agreement with this plan. He also understands that He can call clinic at any time with any questions, concerns, or complaints.    Cancer Staging  Rectal adenocarcinoma [facility]) Staging form: Colon and Rectum, AJCC 8th Edition - Clinical stage from 08/13/2022: Stage IIA (cT3, cN0, cM0) - Signed by Lloyd Huger, MD on 08/13/2022 Total positive nodes: 0  Lloyd Huger, MD   09/22/2022 1:24 PM

## 2022-09-22 NOTE — Patient Instructions (Signed)
[facility] AT [place]  Discharge Instructions: Thank you for choosing [facility] to provide your oncology and hematology care.  If you have a lab appointment with the [facility], please go directly to the [facility] and check in at the registration area.  Wear comfortable clothing and clothing appropriate for easy access to any Portacath or PICC line.   We strive to give you quality time with your provider. You may need to reschedule your appointment if you arrive late (15 or more minutes).  Arriving late affects you and other patients whose appointments are after yours.  Also, if you miss three or more appointments without notifying the office, you may be dismissed from the clinic at the provider's discretion.      For prescription refill requests, have your pharmacy contact our office and allow 72 hours for refills to be completed.    Today you received the following chemotherapy and/or immunotherapy agents OXALIPLATIN, LEUCOVORIN, 5FU      To help prevent nausea and vomiting after your treatment, we encourage you to take your nausea medication as directed.  BELOW ARE SYMPTOMS THAT SHOULD BE REPORTED IMMEDIATELY: *FEVER GREATER THAN 100.4 F (38 C) OR HIGHER *CHILLS OR SWEATING *NAUSEA AND VOMITING THAT IS NOT CONTROLLED WITH YOUR NAUSEA MEDICATION *UNUSUAL SHORTNESS OF BREATH *UNUSUAL BRUISING OR BLEEDING *URINARY PROBLEMS (pain or burning when urinating, or frequent urination) *BOWEL PROBLEMS (unusual diarrhea, constipation, pain near the anus) TENDERNESS IN MOUTH AND THROAT WITH OR WITHOUT PRESENCE OF ULCERS (sore throat, sores in mouth, or a toothache) UNUSUAL RASH, SWELLING OR PAIN  UNUSUAL VAGINAL DISCHARGE OR ITCHING   Items with * indicate a potential emergency and should be followed up as soon as possible or go to the Emergency Department if any problems should occur.  Please show the CHEMOTHERAPY ALERT CARD or IMMUNOTHERAPY ALERT  CARD at check-in to the Emergency Department and triage nurse.  Should you have questions after your visit or need to cancel or reschedule your appointment, please contact [facility] [place] AT [place]  [phone] and follow the prompts.  Office hours are 8:00 a.m. to 4:30 p.m. Monday - Friday. Please note that voicemails left after 4:00 p.m. may not be returned until the following business day.  We are closed weekends and major holidays. You have access to a nurse at all times for urgent questions. Please call the main number to the clinic [phone] and follow the prompts.  For any non-urgent questions, you may also contact your provider using MyChart. We now offer e-Visits for anyone 70 and older to request care online for non-urgent symptoms. For details visit [email or web address].   Also download the MyChart app! Go to the app store, search "MyChart", open the app, select [facility], and log in with your MyChart username and password.  Masks are optional in the cancer centers. If you would like for your care team to wear a mask while they are taking care of you, please let them know. For doctor visits, patients may have with them one support person who is at least 74 years old. At this time, visitors are not allowed in the infusion area.  Oxaliplatin Injection What is this medication? OXALIPLATIN (ox AL i PLA tin) treats some types of cancer. It works by slowing down the growth of cancer cells. This medicine may be used for other purposes; ask your health care provider or pharmacist if you have questions. COMMON BRAND NAME(S): Eloxatin What should I tell  my care team before I take this medication? They need to know if you have any of these conditions: Heart disease History of irregular heartbeat or rhythm Liver disease Low blood cell levels (white cells, red cells, and platelets) Lung or breathing disease, such as asthma Take medications that treat or prevent blood  clots Tingling of the fingers, toes, or other nerve disorder An unusual or allergic reaction to oxaliplatin, other medications, foods, dyes, or preservatives If you or your partner are pregnant or trying to get pregnant Breast-feeding How should I use this medication? This medication is injected into a vein. It is given by your care team in a hospital or clinic setting. Talk to your care team about the use of this medication in children. Special care may be needed. Overdosage: If you think you have taken too much of this medicine contact a poison control center or emergency room at once. NOTE: This medicine is only for you. Do not share this medicine with others. What if I miss a dose? Keep appointments for follow-up doses. It is important not to miss a dose. Call your care team if you are unable to keep an appointment. What may interact with this medication? Do not take this medication with any of the following: Cisapride Dronedarone Pimozide Thioridazine This medication may also interact with the following: Aspirin and aspirin-like medications Certain medications that treat or prevent blood clots, such as warfarin, apixaban, dabigatran, and rivaroxaban Cisplatin Cyclosporine Diuretics Medications for infection, such as acyclovir, adefovir, amphotericin B, bacitracin, cidofovir, foscarnet, ganciclovir, gentamicin, pentamidine, vancomycin NSAIDs, medications for pain and inflammation, such as ibuprofen or naproxen Other medications that cause heart rhythm changes Pamidronate Zoledronic acid This list may not describe all possible interactions. Give your health care provider a list of all the medicines, herbs, non-prescription drugs, or dietary supplements you use. Also tell them if you smoke, drink alcohol, or use illegal drugs. Some items may interact with your medicine. What should I watch for while using this medication? Your condition will be monitored carefully while you are  receiving this medication. You may need blood work while taking this medication. This medication may make you feel generally unwell. This is not uncommon as chemotherapy can affect healthy cells as well as cancer cells. Report any side effects. Continue your course of treatment even though you feel ill unless your care team tells you to stop. This medication may increase your risk of getting an infection. Call your care team for advice if you get a fever, chills, sore throat, or other symptoms of a cold or flu. Do not treat yourself. Try to avoid being around people who are sick. Avoid taking medications that contain aspirin, acetaminophen, ibuprofen, naproxen, or ketoprofen unless instructed by your care team. These medications may hide a fever. Be careful brushing or flossing your teeth or using a toothpick because you may get an infection or bleed more easily. If you have any dental work done, tell your dentist you are receiving this medication. This medication can make you more sensitive to cold. Do not drink cold drinks or use ice. Cover exposed skin before coming in contact with cold temperatures or cold objects. When out in cold weather wear warm clothing and cover your mouth and nose to warm the air that goes into your lungs. Tell your care team if you get sensitive to the cold. Talk to your care team if you or your partner are pregnant or think either of you might be pregnant. This medication  can cause serious birth defects if taken during pregnancy and for 9 months after the last dose. A negative pregnancy test is required before starting this medication. A reliable form of contraception is recommended while taking this medication and for 9 months after the last dose. Talk to your care team about effective forms of contraception. Do not father a child while taking this medication and for 6 months after the last dose. Use a condom while having sex during this time period. Do not breastfeed while  taking this medication and for 3 months after the last dose. This medication may cause infertility. Talk to your care team if you are concerned about your fertility. What side effects may I notice from receiving this medication? Side effects that you should report to your care team as soon as possible: Allergic reactions--skin rash, itching, hives, swelling of the face, lips, tongue, or throat Bleeding--bloody or black, tar-like stools, vomiting blood or brown material that looks like coffee grounds, red or dark brown urine, small red or purple spots on skin, unusual bruising or bleeding Dry cough, shortness of breath or trouble breathing Heart rhythm changes--fast or irregular heartbeat, dizziness, feeling faint or lightheaded, chest pain, trouble breathing Infection--fever, chills, cough, sore throat, wounds that don't heal, pain or trouble when passing urine, general feeling of discomfort or being unwell Liver injury--right upper belly pain, loss of appetite, nausea, light-colored stool, dark yellow or brown urine, yellowing skin or eyes, unusual weakness or fatigue Low red blood cell level--unusual weakness or fatigue, dizziness, headache, trouble breathing Muscle injury--unusual weakness or fatigue, muscle pain, dark yellow or brown urine, decrease in amount of urine Pain, tingling, or numbness in the hands or feet Sudden and severe headache, confusion, change in vision, seizures, which may be signs of posterior reversible encephalopathy syndrome (PRES) Unusual bruising or bleeding Side effects that usually do not require medical attention (report to your care team if they continue or are bothersome): Diarrhea Nausea Pain, redness, or swelling with sores inside the mouth or throat Unusual weakness or fatigue Vomiting This list may not describe all possible side effects. Call your doctor for medical advice about side effects. You may report side effects to FDA at 1-800-FDA-1088. Where  should I keep my medication? This medication is given in a hospital or clinic. It will not be stored at home. NOTE: This sheet is a summary. It may not cover all possible information. If you have questions about this medicine, talk to your doctor, pharmacist, or health care provider.  2023 Elsevier/Gold Standard (2007-12-24 00:00:00)   Leucovorin Injection What is this medication? LEUCOVORIN (loo koe VOR in) prevents side effects from certain medications, such as methotrexate. It works by increasing folate levels. This helps protect healthy cells in your body. It may also be used to treat anemia caused by low levels of folate. It can also be used with fluorouracil, a type of chemotherapy, to treat colorectal cancer. It works by increasing the effects of fluorouracil in the body. This medicine may be used for other purposes; ask your health care provider or pharmacist if you have questions. What should I tell my care team before I take this medication? They need to know if you have any of these conditions: Anemia from low levels of vitamin B12 in the blood An unusual or allergic reaction to leucovorin, folic acid, other medications, foods, dyes, or preservatives Pregnant or trying to get pregnant Breastfeeding How should I use this medication? This medication is injected into a vein  or a muscle. It is given by your care team in a hospital or clinic setting. Talk to your care team about the use of this medication in children. Special care may be needed. Overdosage: If you think you have taken too much of this medicine contact a poison control center or emergency room at once. NOTE: This medicine is only for you. Do not share this medicine with others. What if I miss a dose? Keep appointments for follow-up doses. It is important not to miss your dose. Call your care team if you are unable to keep an appointment. What may interact with this  medication? Capecitabine Fluorouracil Phenobarbital Phenytoin Primidone Trimethoprim;sulfamethoxazole This list may not describe all possible interactions. Give your health care provider a list of all the medicines, herbs, non-prescription drugs, or dietary supplements you use. Also tell them if you smoke, drink alcohol, or use illegal drugs. Some items may interact with your medicine. What should I watch for while using this medication? Your condition will be monitored carefully while you are receiving this medication. This medication may increase the side effects of 5-fluorouracil. Tell your care team if you have diarrhea or mouth sores that do not get better or that get worse. What side effects may I notice from receiving this medication? Side effects that you should report to your care team as soon as possible: Allergic reactions--skin rash, itching, hives, swelling of the face, lips, tongue, or throat This list may not describe all possible side effects. Call your doctor for medical advice about side effects. You may report side effects to FDA at 1-800-FDA-1088. Where should I keep my medication? This medication is given in a hospital or clinic. It will not be stored at home. NOTE: This sheet is a summary. It may not cover all possible information. If you have questions about this medicine, talk to your doctor, pharmacist, or health care provider.  2023 Elsevier/Gold Standard (2022-04-07 00:00:00)  5'-Nucleotidase Test Why am I having this test? The 5'-nucleotidase test is used along with other liver function tests to determine if you have liver or gallbladder problems. What is being tested? This test measures the amount of 5'-nucleotidase in your blood. 5'-nucleotidase is a protein that is produced by the liver. The level of this protein is raised (elevated) in people who have liver disease. What kind of sample is taken?  A blood sample is required for this test. It is usually  collected by inserting a needle into a blood vessel. How do I prepare for this test? Follow instructions from your health care provider about eating or drinking restrictions before the test. Stop taking certain medicines before your blood test, as told by your health care provider. Tell a health care provider about: All medicines you are taking, including vitamins, herbs, eye drops, creams, and over-the-counter medicines. Any medical conditions you have. Whether you are pregnant or may be pregnant. How are the results reported? Your test results will be reported as a value that tells you the amount of 5'-nucleotidase in your blood. Your health care provider will compare your results to normal ranges that were established after testing a large group of people (reference ranges). Reference ranges may vary among labs and hospitals. For this test, a common reference range is: 0-1.6 units at 37C. What do the results mean? A 5'-nucleotidase level that is higher than the normal reference range may be a sign of liver disease. Talk with your health care provider about what your results mean. Questions to ask  your health care provider Ask your health care provider, or the department that is doing the test: When will my results be ready? How will I get my results? What are my treatment options? What other tests do I need? What are my next steps? Summary The 5'-nucleotidase test is used along with other liver function tests to determine if you have liver or gallbladder problems. This test measures the amount of a liver protein called 5'-nucleotidase, which is usually elevated in people with liver disease. Results that are higher than the normal reference range may indicate liver disease. This information is not intended to replace advice given to you by your health care provider. Make sure you discuss any questions you have with your health care provider. Document Revised: 08/06/2020 Document Reviewed:  08/06/2020 Elsevier Patient Education  [place].

## 2022-09-24 ENCOUNTER — Inpatient Hospital Stay: Payer: Medicare HMO

## 2022-09-24 DIAGNOSIS — C20 Malignant neoplasm of rectum: Secondary | ICD-10-CM | POA: Diagnosis not present

## 2022-09-24 DIAGNOSIS — R5383 Other fatigue: Secondary | ICD-10-CM | POA: Diagnosis not present

## 2022-09-24 DIAGNOSIS — D709 Neutropenia, unspecified: Secondary | ICD-10-CM | POA: Diagnosis not present

## 2022-09-24 DIAGNOSIS — I2510 Atherosclerotic heart disease of native coronary artery without angina pectoris: Secondary | ICD-10-CM | POA: Diagnosis not present

## 2022-09-24 DIAGNOSIS — Z5111 Encounter for antineoplastic chemotherapy: Secondary | ICD-10-CM | POA: Diagnosis not present

## 2022-09-24 DIAGNOSIS — E119 Type 2 diabetes mellitus without complications: Secondary | ICD-10-CM | POA: Diagnosis not present

## 2022-09-24 DIAGNOSIS — D696 Thrombocytopenia, unspecified: Secondary | ICD-10-CM | POA: Diagnosis not present

## 2022-09-24 DIAGNOSIS — Z5189 Encounter for other specified aftercare: Secondary | ICD-10-CM | POA: Diagnosis not present

## 2022-09-24 DIAGNOSIS — D649 Anemia, unspecified: Secondary | ICD-10-CM | POA: Diagnosis not present

## 2022-09-24 MED ORDER — HEPARIN SOD (PORK) LOCK FLUSH 100 UNIT/ML IV SOLN
500.0000 [IU] | Freq: Once | INTRAVENOUS | Status: AC | PRN
  Administered 2022-09-24: 500 [IU]
  Filled 2022-09-24: qty 5

## 2022-09-24 MED ORDER — PEGFILGRASTIM-CBQV 6 MG/0.6ML ~~LOC~~ SOSY
6.0000 mg | PREFILLED_SYRINGE | Freq: Once | SUBCUTANEOUS | Status: AC
  Administered 2022-09-24: 6 mg via SUBCUTANEOUS

## 2022-09-24 MED ORDER — SODIUM CHLORIDE 0.9% FLUSH
10.0000 mL | INTRAVENOUS | Status: DC | PRN
  Administered 2022-09-24: 10 mL
  Filled 2022-09-24: qty 10

## 2022-09-30 ENCOUNTER — Encounter (HOSPITAL_COMMUNITY): Payer: Self-pay | Admitting: Nurse Practitioner

## 2022-10-02 ENCOUNTER — Telehealth: Payer: Self-pay

## 2022-10-02 MED FILL — Dexamethasone Sodium Phosphate Inj 100 MG/10ML: INTRAMUSCULAR | Qty: 1 | Status: AC

## 2022-10-02 NOTE — Telephone Encounter (Signed)
error 

## 2022-10-05 ENCOUNTER — Inpatient Hospital Stay (HOSPITAL_BASED_OUTPATIENT_CLINIC_OR_DEPARTMENT_OTHER): Payer: Medicare HMO | Admitting: Oncology

## 2022-10-05 ENCOUNTER — Inpatient Hospital Stay: Payer: Medicare HMO

## 2022-10-05 ENCOUNTER — Encounter: Payer: Self-pay | Admitting: Oncology

## 2022-10-05 VITALS — BP 148/87 | HR 67 | Temp 98.4°F | Resp 16 | Ht 72.0 in | Wt 177.0 lb

## 2022-10-05 DIAGNOSIS — C20 Malignant neoplasm of rectum: Secondary | ICD-10-CM

## 2022-10-05 DIAGNOSIS — R5383 Other fatigue: Secondary | ICD-10-CM | POA: Diagnosis not present

## 2022-10-05 DIAGNOSIS — Z5111 Encounter for antineoplastic chemotherapy: Secondary | ICD-10-CM | POA: Diagnosis not present

## 2022-10-05 DIAGNOSIS — D696 Thrombocytopenia, unspecified: Secondary | ICD-10-CM | POA: Diagnosis not present

## 2022-10-05 DIAGNOSIS — E119 Type 2 diabetes mellitus without complications: Secondary | ICD-10-CM | POA: Diagnosis not present

## 2022-10-05 DIAGNOSIS — D709 Neutropenia, unspecified: Secondary | ICD-10-CM | POA: Diagnosis not present

## 2022-10-05 DIAGNOSIS — D649 Anemia, unspecified: Secondary | ICD-10-CM | POA: Diagnosis not present

## 2022-10-05 DIAGNOSIS — Z5189 Encounter for other specified aftercare: Secondary | ICD-10-CM | POA: Diagnosis not present

## 2022-10-05 DIAGNOSIS — I2510 Atherosclerotic heart disease of native coronary artery without angina pectoris: Secondary | ICD-10-CM | POA: Diagnosis not present

## 2022-10-05 LAB — CBC WITH DIFFERENTIAL/PLATELET
Abs Immature Granulocytes: 0.75 10*3/uL — ABNORMAL HIGH (ref 0.00–0.07)
Basophils Absolute: 0.1 10*3/uL (ref 0.0–0.1)
Basophils Relative: 1 %
Eosinophils Absolute: 0.1 10*3/uL (ref 0.0–0.5)
Eosinophils Relative: 1 %
HCT: 29.6 % — ABNORMAL LOW (ref 39.0–52.0)
Hemoglobin: 9 g/dL — ABNORMAL LOW (ref 13.0–17.0)
Immature Granulocytes: 6 %
Lymphocytes Relative: 13 %
Lymphs Abs: 1.7 10*3/uL (ref 0.7–4.0)
MCH: 25.5 pg — ABNORMAL LOW (ref 26.0–34.0)
MCHC: 30.4 g/dL (ref 30.0–36.0)
MCV: 83.9 fL (ref 80.0–100.0)
Monocytes Absolute: 0.9 10*3/uL (ref 0.1–1.0)
Monocytes Relative: 7 %
Neutro Abs: 9.1 10*3/uL — ABNORMAL HIGH (ref 1.7–7.7)
Neutrophils Relative %: 72 %
Platelets: 75 10*3/uL — ABNORMAL LOW (ref 150–400)
RBC: 3.53 MIL/uL — ABNORMAL LOW (ref 4.22–5.81)
RDW: 21.3 % — ABNORMAL HIGH (ref 11.5–15.5)
WBC: 12.6 10*3/uL — ABNORMAL HIGH (ref 4.0–10.5)
nRBC: 0.5 % — ABNORMAL HIGH (ref 0.0–0.2)

## 2022-10-05 LAB — COMPREHENSIVE METABOLIC PANEL
ALT: 53 U/L — ABNORMAL HIGH (ref 0–44)
AST: 48 U/L — ABNORMAL HIGH (ref 15–41)
Albumin: 3.8 g/dL (ref 3.5–5.0)
Alkaline Phosphatase: 88 U/L (ref 38–126)
Anion gap: 8 (ref 5–15)
BUN: 19 mg/dL (ref 8–23)
CO2: 22 mmol/L (ref 22–32)
Calcium: 8.5 mg/dL — ABNORMAL LOW (ref 8.9–10.3)
Chloride: 106 mmol/L (ref 98–111)
Creatinine, Ser: 0.96 mg/dL (ref 0.61–1.24)
GFR, Estimated: >60 mL/min (ref >60)
Glucose, Bld: 177 mg/dL — ABNORMAL HIGH (ref 70–99)
Potassium: 3.6 mmol/L (ref 3.5–5.1)
Sodium: 136 mmol/L (ref 135–145)
Total Bilirubin: 0.6 mg/dL (ref 0.3–1.2)
Total Protein: 6.8 g/dL (ref 6.5–8.1)

## 2022-10-05 MED ORDER — HEPARIN SOD (PORK) LOCK FLUSH 100 UNIT/ML IV SOLN
500.0000 [IU] | Freq: Once | INTRAVENOUS | Status: AC
  Administered 2022-10-05: 500 [IU] via INTRAVENOUS
  Filled 2022-10-05: qty 5

## 2022-10-05 NOTE — Progress Notes (Signed)
[place]  [phone] [phone]) [phone]  ID: Kaitlin Ardito OB: 10/22/1946  MR#: [id number]  RSW#:[id number]  Patient Care Team: [facility] as PCP - General [name], [name] November, MD as PCP - Cardiology (Cardiology) [name], MD as Consulting Physician (Cardiology) [name], RN as Oncology Nurse Navigator  CHIEF COMPLAINT: Stage IIa rectal adenocarcinoma.  INTERVAL HISTORY: Patient returns to clinic today for further evaluation and consideration of cycle 4 of 8 of neoadjuvant FOLFOX.  He currently feels well and is asymptomatic.  He is tolerating his treatments without significant side effects.  He has no neurologic complaints.  He denies any recent fevers or illnesses.  He has no chest pain, shortness of breath, cough, or hemoptysis.  He denies any nausea, vomiting, constipation, or diarrhea.  He does not report any melena or hematochezia.  He has no urinary complaints.  Patient offers no specific complaints today.  REVIEW OF SYSTEMS:   Review of Systems  Constitutional: Negative.  Negative for fever, malaise/fatigue and weight loss.  Respiratory: Negative.  Negative for cough, hemoptysis and shortness of breath.   Cardiovascular: Negative.  Negative for chest pain and leg swelling.  Gastrointestinal: Negative.  Negative for abdominal pain.  Genitourinary: Negative.  Negative for dysuria.  Musculoskeletal: Negative.  Negative for back pain.  Skin: Negative.  Negative for rash.  Neurological: Negative.  Negative for dizziness, focal weakness, weakness and headaches.  Psychiatric/Behavioral: Negative.  The patient is not nervous/anxious.     As per HPI. Otherwise, a complete review of systems is negative.  PAST MEDICAL HISTORY: Past Medical History:  Diagnosis Date   Anemia    Anginal pain (HCC)    Bradycardia    Cancer (HCC)    Coronary artery disease    x 1 stent   Depression    Diabetes mellitus without  complication (HCC)    GERD (gastroesophageal reflux disease)    History of kidney stones    Hyperlipidemia    Hypertension    Nephrolithiasis     PAST SURGICAL HISTORY: Past Surgical History:  Procedure Laterality Date   CARDIAC CATHETERIZATION     CATARACT EXTRACTION     CORONARY ANGIOPLASTY     FLEXIBLE SIGMOIDOSCOPY N/A 07/27/2022   Procedure: FLEXIBLE SIGMOIDOSCOPY;  Surgeon: Lesly Rubenstein, MD;  Location: [facility] ENDOSCOPY;  Service: Endoscopy;  Laterality: N/A;   KIDNEY STONE SURGERY     NECK SURGERY  12/18/2011   nephrolithiasis     PORTACATH PLACEMENT Right 08/24/2022   Procedure: INSERTION PORT-A-CATH;  Surgeon: Ronny Bacon, MD;  Location: [facility] ORS;  Service: General;  Laterality: Right;   stent (other)     TRANSVERSE LOOP COLOSTOMY N/A 07/28/2022   Procedure: TRANSVERSE LOOP COLOSTOMY;  Surgeon: Ronny Bacon, MD;  Location: [facility] ORS;  Service: General;  Laterality: N/A;    FAMILY HISTORY: Family History  Problem Relation Age of Onset   Heart disease Father     ADVANCED DIRECTIVES (Y/N):  N  HEALTH MAINTENANCE: Social History   Tobacco Use   Smoking status: Every Day    Packs/day: 0.50    Years: 36.00    Total pack years: 18.00    Types: Cigarettes   Smokeless tobacco: Never  Vaping Use   Vaping Use: Never used  Substance Use Topics   Alcohol use: No   Drug use: No     Colonoscopy:  PAP:  Bone density:  Lipid panel:  No Known Allergies  Current Outpatient Medications  Medication  Sig Dispense Refill   aspirin 81 MG EC tablet Take 81 mg by mouth daily.       atorvastatin (LIPITOR) 40 MG tablet Take 1 tablet (40 mg total) by mouth daily. 90 tablet 3   cyanocobalamin (VITAMIN B12) 500 MCG tablet Take 500 mcg by mouth daily.     famotidine (PEPCID) 40 MG tablet Take 1 tablet (40 mg total) by mouth daily. 90 tablet 3   glimepiride (AMARYL) 4 MG tablet Take 4 mg by mouth daily with breakfast.     HYDROcodone-acetaminophen (NORCO/VICODIN)  5-325 MG tablet Take 1 tablet by mouth every 6 (six) hours as needed for moderate pain.     ibuprofen (ADVIL) 800 MG tablet Take 1 tablet (800 mg total) by mouth every 8 (eight) hours as needed. 30 tablet 0   isosorbide mononitrate (IMDUR) 30 MG 24 hr tablet Take 1 tablet (30 mg total) by mouth daily. 90 tablet 3   lidocaine-prilocaine (EMLA) cream Apply to affected area once 30 g 3   metFORMIN (GLUCOPHAGE) 1000 MG tablet Take 1,000 mg by mouth 2 (two) times daily with a meal.     nitroGLYCERIN (NITROSTAT) 0.4 MG SL tablet Place 1 tablet (0.4 mg total) under the tongue every 5 (five) minutes as needed for chest pain. 25 tablet 6   ondansetron (ZOFRAN) 8 MG tablet Take 1 tablet (8 mg total) by mouth every 8 (eight) hours as needed for nausea or vomiting. 60 tablet 2   pioglitazone (ACTOS) 15 MG tablet Take 15 mg by mouth daily.     prochlorperazine (COMPAZINE) 10 MG tablet Take 1 tablet (10 mg total) by mouth every 6 (six) hours as needed for nausea or vomiting. 60 tablet 2   clopidogrel (PLAVIX) 75 MG tablet Take 75 mg by mouth daily. (Patient not taking: Reported on 09/01/2022)     glimepiride (AMARYL) 4 MG tablet Take 4 mg by mouth daily with breakfast.     metFORMIN (GLUCOPHAGE) 1000 MG tablet Take 1,000 mg by mouth 2 (two) times daily with a meal.      No current facility-administered medications for this visit.    OBJECTIVE: Vitals:   10/05/22 0836  BP: (!) 148/87  Pulse: 67  Resp: 16  Temp: 98.4 F (36.9 C)  SpO2: 100%     Body mass index is 24.01 kg/m.    ECOG FS:0 - Asymptomatic  General: Well-developed, well-nourished, no acute distress. Eyes: Pink conjunctiva, anicteric sclera. HEENT: Normocephalic, moist mucous membranes. Lungs: No audible wheezing or coughing. Heart: Regular rate and rhythm. Abdomen: Soft, nontender, no obvious distention. Musculoskeletal: No edema, cyanosis, or clubbing. Neuro: Alert, answering all questions appropriately. Cranial nerves grossly  intact. Skin: No rashes or petechiae noted. Psych: Normal affect.  LAB RESULTS:  Lab Results  Component Value Date   NA 136 10/05/2022   K 3.6 10/05/2022   CL 106 10/05/2022   CO2 22 10/05/2022   GLUCOSE 177 (H) 10/05/2022   BUN 19 10/05/2022   CREATININE 0.96 10/05/2022   CALCIUM 8.5 (L) 10/05/2022   PROT 6.8 10/05/2022   ALBUMIN 3.8 10/05/2022   AST 48 (H) 10/05/2022   ALT 53 (H) 10/05/2022   ALKPHOS 88 10/05/2022   BILITOT 0.6 10/05/2022   GFRNONAA >60 10/05/2022   GFRAA >60 12/20/2014    Lab Results  Component Value Date   WBC 12.6 (H) 10/05/2022   NEUTROABS 9.1 (H) 10/05/2022   HGB 9.0 (L) 10/05/2022   HCT 29.6 (L) 10/05/2022   MCV 83.9  10/05/2022   PLT 75 (L) 10/05/2022     STUDIES: No results found.  ASSESSMENT: Stage IIa rectal adenocarcinoma.  PLAN:    Stage IIa rectal adenocarcinoma: Diagnosis confirmed by imaging and biopsy.  MRI and CT scan results reviewed independently and reported as above confirming stage of disease.  Patient will benefit from neoadjuvant FOLFOX every 2 weeks x 8 cycles followed by concurrent Xeloda and daily XRT.  Finally, patient will benefit from definitive surgical intervention of his residual malignancy.  Unclear if colostomy is reversible.  Patient has now had port placement.  Delay cycle 4 of FOLFOX today secondary to thrombocytopenia.  Patient plans to have a dental procedure next week, therefore he will return to clinic in 2 weeks for further evaluation and reconsideration of cycle 4.   Anemia: Chronic and unchanged.  Patient's hemoglobin is 9.0.   Neutropenia: Resolved.  Patient now has a mild leukocytosis.  Continue Udenyca with pump off as needed.   Thrombocytopenia: Platelet count 75.  Delay treatment as above.   Patient expressed understanding and was in agreement with this plan. He also understands that He can call clinic at any time with any questions, concerns, or complaints.    Cancer Staging  Rectal  adenocarcinoma [facility]) Staging form: Colon and Rectum, AJCC 8th Edition - Clinical stage from 08/13/2022: Stage IIA (cT3, cN0, cM0) - Signed by Lloyd Huger, MD on 08/13/2022 Total positive nodes: 0  Lloyd Huger, MD   10/05/2022 1:39 PM

## 2022-10-06 ENCOUNTER — Ambulatory Visit: Payer: Medicare HMO | Admitting: Oncology

## 2022-10-06 ENCOUNTER — Ambulatory Visit: Payer: Medicare HMO

## 2022-10-06 ENCOUNTER — Ambulatory Visit: Payer: Medicare HMO | Admitting: Surgery

## 2022-10-06 ENCOUNTER — Other Ambulatory Visit: Payer: Medicare HMO

## 2022-10-19 MED FILL — Dexamethasone Sodium Phosphate Inj 100 MG/10ML: INTRAMUSCULAR | Qty: 1 | Status: AC

## 2022-10-20 ENCOUNTER — Encounter: Payer: Self-pay | Admitting: Oncology

## 2022-10-20 ENCOUNTER — Inpatient Hospital Stay: Payer: Medicare HMO

## 2022-10-20 ENCOUNTER — Inpatient Hospital Stay: Payer: Medicare HMO | Attending: Oncology

## 2022-10-20 ENCOUNTER — Inpatient Hospital Stay (HOSPITAL_BASED_OUTPATIENT_CLINIC_OR_DEPARTMENT_OTHER): Payer: Medicare HMO | Admitting: Oncology

## 2022-10-20 DIAGNOSIS — I10 Essential (primary) hypertension: Secondary | ICD-10-CM | POA: Diagnosis not present

## 2022-10-20 DIAGNOSIS — C20 Malignant neoplasm of rectum: Secondary | ICD-10-CM

## 2022-10-20 DIAGNOSIS — R5383 Other fatigue: Secondary | ICD-10-CM | POA: Insufficient documentation

## 2022-10-20 DIAGNOSIS — F17210 Nicotine dependence, cigarettes, uncomplicated: Secondary | ICD-10-CM | POA: Diagnosis not present

## 2022-10-20 DIAGNOSIS — Z7902 Long term (current) use of antithrombotics/antiplatelets: Secondary | ICD-10-CM | POA: Insufficient documentation

## 2022-10-20 DIAGNOSIS — D6481 Anemia due to antineoplastic chemotherapy: Secondary | ICD-10-CM | POA: Diagnosis not present

## 2022-10-20 DIAGNOSIS — I2510 Atherosclerotic heart disease of native coronary artery without angina pectoris: Secondary | ICD-10-CM | POA: Insufficient documentation

## 2022-10-20 DIAGNOSIS — K219 Gastro-esophageal reflux disease without esophagitis: Secondary | ICD-10-CM | POA: Diagnosis not present

## 2022-10-20 DIAGNOSIS — E119 Type 2 diabetes mellitus without complications: Secondary | ICD-10-CM | POA: Insufficient documentation

## 2022-10-20 DIAGNOSIS — Z79899 Other long term (current) drug therapy: Secondary | ICD-10-CM | POA: Insufficient documentation

## 2022-10-20 DIAGNOSIS — D509 Iron deficiency anemia, unspecified: Secondary | ICD-10-CM | POA: Insufficient documentation

## 2022-10-20 DIAGNOSIS — Z5111 Encounter for antineoplastic chemotherapy: Secondary | ICD-10-CM | POA: Diagnosis not present

## 2022-10-20 DIAGNOSIS — T451X5A Adverse effect of antineoplastic and immunosuppressive drugs, initial encounter: Secondary | ICD-10-CM | POA: Insufficient documentation

## 2022-10-20 DIAGNOSIS — Z7982 Long term (current) use of aspirin: Secondary | ICD-10-CM | POA: Diagnosis not present

## 2022-10-20 DIAGNOSIS — E785 Hyperlipidemia, unspecified: Secondary | ICD-10-CM | POA: Insufficient documentation

## 2022-10-20 DIAGNOSIS — D696 Thrombocytopenia, unspecified: Secondary | ICD-10-CM | POA: Diagnosis not present

## 2022-10-20 DIAGNOSIS — Z7984 Long term (current) use of oral hypoglycemic drugs: Secondary | ICD-10-CM | POA: Diagnosis not present

## 2022-10-20 LAB — COMPREHENSIVE METABOLIC PANEL
ALT: 35 U/L (ref 0–44)
AST: 31 U/L (ref 15–41)
Albumin: 3.7 g/dL (ref 3.5–5.0)
Alkaline Phosphatase: 73 U/L (ref 38–126)
Anion gap: 9 (ref 5–15)
BUN: 13 mg/dL (ref 8–23)
CO2: 23 mmol/L (ref 22–32)
Calcium: 8.5 mg/dL — ABNORMAL LOW (ref 8.9–10.3)
Chloride: 107 mmol/L (ref 98–111)
Creatinine, Ser: 0.87 mg/dL (ref 0.61–1.24)
GFR, Estimated: >60 mL/min (ref >60)
Glucose, Bld: 230 mg/dL — ABNORMAL HIGH (ref 70–99)
Potassium: 3.8 mmol/L (ref 3.5–5.1)
Sodium: 139 mmol/L (ref 135–145)
Total Bilirubin: 0.5 mg/dL (ref 0.3–1.2)
Total Protein: 6.5 g/dL (ref 6.5–8.1)

## 2022-10-20 LAB — CBC WITH DIFFERENTIAL/PLATELET
Abs Immature Granulocytes: 0.04 10*3/uL (ref 0.00–0.07)
Basophils Absolute: 0.1 10*3/uL (ref 0.0–0.1)
Basophils Relative: 1 %
Eosinophils Absolute: 0.2 10*3/uL (ref 0.0–0.5)
Eosinophils Relative: 2 %
HCT: 29.7 % — ABNORMAL LOW (ref 39.0–52.0)
Hemoglobin: 9.1 g/dL — ABNORMAL LOW (ref 13.0–17.0)
Immature Granulocytes: 0 %
Lymphocytes Relative: 13 %
Lymphs Abs: 1.2 10*3/uL (ref 0.7–4.0)
MCH: 25.6 pg — ABNORMAL LOW (ref 26.0–34.0)
MCHC: 30.6 g/dL (ref 30.0–36.0)
MCV: 83.7 fL (ref 80.0–100.0)
Monocytes Absolute: 0.8 10*3/uL (ref 0.1–1.0)
Monocytes Relative: 8 %
Neutro Abs: 7.1 10*3/uL (ref 1.7–7.7)
Neutrophils Relative %: 76 %
Platelets: 204 10*3/uL (ref 150–400)
RBC: 3.55 MIL/uL — ABNORMAL LOW (ref 4.22–5.81)
RDW: 23.2 % — ABNORMAL HIGH (ref 11.5–15.5)
WBC: 9.4 10*3/uL (ref 4.0–10.5)
nRBC: 0 % (ref 0.0–0.2)

## 2022-10-20 MED ORDER — OXALIPLATIN CHEMO INJECTION 100 MG/20ML
85.0000 mg/m2 | Freq: Once | INTRAVENOUS | Status: AC
  Administered 2022-10-20: 175 mg via INTRAVENOUS
  Filled 2022-10-20: qty 35

## 2022-10-20 MED ORDER — DEXTROSE 5 % IV SOLN
Freq: Once | INTRAVENOUS | Status: AC
  Filled 2022-10-20: qty 250

## 2022-10-20 MED ORDER — SODIUM CHLORIDE 0.9 % IV SOLN
10.0000 mg | Freq: Once | INTRAVENOUS | Status: AC
  Administered 2022-10-20: 10 mg via INTRAVENOUS
  Filled 2022-10-20: qty 1
  Filled 2022-10-20: qty 10

## 2022-10-20 MED ORDER — PALONOSETRON HCL INJECTION 0.25 MG/5ML
0.2500 mg | Freq: Once | INTRAVENOUS | Status: AC
  Administered 2022-10-20: 0.25 mg via INTRAVENOUS
  Filled 2022-10-20: qty 5

## 2022-10-20 MED ORDER — SODIUM CHLORIDE 0.9 % IV SOLN
2400.0000 mg/m2 | INTRAVENOUS | Status: DC
  Administered 2022-10-20: 4850 mg via INTRAVENOUS
  Filled 2022-10-20: qty 97

## 2022-10-20 MED ORDER — LEUCOVORIN CALCIUM INJECTION 350 MG
800.0000 mg | Freq: Once | INTRAVENOUS | Status: AC
  Administered 2022-10-20: 800 mg via INTRAVENOUS
  Filled 2022-10-20: qty 40

## 2022-10-20 MED ORDER — FLUOROURACIL CHEMO INJECTION 2.5 GM/50ML
400.0000 mg/m2 | Freq: Once | INTRAVENOUS | Status: AC
  Administered 2022-10-20: 800 mg via INTRAVENOUS
  Filled 2022-10-20: qty 16

## 2022-10-20 NOTE — Patient Instructions (Signed)
[facility] CANCER CTR AT [place]  Discharge Instructions: Thank you for choosing [facility] to provide your oncology and hematology care.  If you have a lab appointment with the [facility], please go directly to the [facility] and check in at the registration area.  Wear comfortable clothing and clothing appropriate for easy access to any Portacath or PICC line.   We strive to give you quality time with your provider. You may need to reschedule your appointment if you arrive late (15 or more minutes).  Arriving late affects you and other patients whose appointments are after yours.  Also, if you miss three or more appointments without notifying the office, you may be dismissed from the clinic at the provider's discretion.      For prescription refill requests, have your pharmacy contact our office and allow 72 hours for refills to be completed.    Today you received the following chemotherapy and/or immunotherapy agents oxaliplatin, leucovorin, 5FU      To help prevent nausea and vomiting after your treatment, we encourage you to take your nausea medication as directed.  BELOW ARE SYMPTOMS THAT SHOULD BE REPORTED IMMEDIATELY: *FEVER GREATER THAN 100.4 F (38 C) OR HIGHER *CHILLS OR SWEATING *NAUSEA AND VOMITING THAT IS NOT CONTROLLED WITH YOUR NAUSEA MEDICATION *UNUSUAL SHORTNESS OF BREATH *UNUSUAL BRUISING OR BLEEDING *URINARY PROBLEMS (pain or burning when urinating, or frequent urination) *BOWEL PROBLEMS (unusual diarrhea, constipation, pain near the anus) TENDERNESS IN MOUTH AND THROAT WITH OR WITHOUT PRESENCE OF ULCERS (sore throat, sores in mouth, or a toothache) UNUSUAL RASH, SWELLING OR PAIN  UNUSUAL VAGINAL DISCHARGE OR ITCHING   Items with * indicate a potential emergency and should be followed up as soon as possible or go to the Emergency Department if any problems should occur.  Please show the CHEMOTHERAPY ALERT CARD or IMMUNOTHERAPY ALERT  CARD at check-in to the Emergency Department and triage nurse.  Should you have questions after your visit or need to cancel or reschedule your appointment, please contact [facility] CANCER [place] AT [place]  [phone] and follow the prompts.  Office hours are 8:00 a.m. to 4:30 p.m. Monday - Friday. Please note that voicemails left after 4:00 p.m. may not be returned until the following business day.  We are closed weekends and major holidays. You have access to a nurse at all times for urgent questions. Please call the main number to the clinic [phone] and follow the prompts.  For any non-urgent questions, you may also contact your provider using MyChart. We now offer e-Visits for anyone 65 and older to request care online for non-urgent symptoms. For details visit [email or web address].   Also download the MyChart app! Go to the app store, search "MyChart", open the app, select [facility], and log in with your MyChart username and password.  Masks are optional in the cancer centers. If you would like for your care team to wear a mask while they are taking care of you, please let them know. For doctor visits, patients may have with them one support person who is at least 74 years old. At this time, visitors are not allowed in the infusion area.

## 2022-10-20 NOTE — Progress Notes (Signed)
[place]  [phone] Fax:(336) [phone]  ID: [name] [name] OB: 05-30-1946  MR#: [id number]  GQQ#:[id number]  Patient Care Team: [facility], [facility] as PCP - General [name], [name] November, MD as PCP - Cardiology (Cardiology) [name], MD as Consulting Physician (Cardiology) [name], RN as Oncology Nurse Navigator  CHIEF COMPLAINT: Stage IIa rectal adenocarcinoma.  INTERVAL HISTORY: Patient returns to clinic today for further evaluation and reconsideration of cycle 4 of 8 of neoadjuvant FOLFOX.  He continues to feel well and remains asymptomatic.  He is tolerating his treatments without significant side effects.  He has no neurologic complaints.  He denies any recent fevers or illnesses.  He has no chest pain, shortness of breath, cough, or hemoptysis.  He denies any nausea, vomiting, constipation, or diarrhea.  He does not report any melena or hematochezia.  He has no urinary complaints.  Patient offers no specific complaints today.  REVIEW OF SYSTEMS:   Review of Systems  Constitutional: Negative.  Negative for fever, malaise/fatigue and weight loss.  Respiratory: Negative.  Negative for cough, hemoptysis and shortness of breath.   Cardiovascular: Negative.  Negative for chest pain and leg swelling.  Gastrointestinal: Negative.  Negative for abdominal pain.  Genitourinary: Negative.  Negative for dysuria.  Musculoskeletal: Negative.  Negative for back pain.  Skin: Negative.  Negative for rash.  Neurological: Negative.  Negative for dizziness, focal weakness, weakness and headaches.  Psychiatric/Behavioral: Negative.  The patient is not nervous/anxious.     As per HPI. Otherwise, a complete review of systems is negative.  PAST MEDICAL HISTORY: Past Medical History:  Diagnosis Date   Anemia    Anginal pain (HCC)    Bradycardia    Cancer (HCC)    Coronary artery disease    x 1 stent   Depression    Diabetes  mellitus without complication (HCC)    GERD (gastroesophageal reflux disease)    History of kidney stones    Hyperlipidemia    Hypertension    Nephrolithiasis     PAST SURGICAL HISTORY: Past Surgical History:  Procedure Laterality Date   CARDIAC CATHETERIZATION     CATARACT EXTRACTION     CORONARY ANGIOPLASTY     FLEXIBLE SIGMOIDOSCOPY N/A 07/27/2022   Procedure: FLEXIBLE SIGMOIDOSCOPY;  Surgeon: Lesly Rubenstein, MD;  Location: [facility] ENDOSCOPY;  Service: Endoscopy;  Laterality: N/A;   KIDNEY STONE SURGERY     NECK SURGERY  12/18/2011   nephrolithiasis     PORTACATH PLACEMENT Right 08/24/2022   Procedure: INSERTION PORT-A-CATH;  Surgeon: Ronny Bacon, MD;  Location: [facility] ORS;  Service: General;  Laterality: Right;   stent (other)     TRANSVERSE LOOP COLOSTOMY N/A 07/28/2022   Procedure: TRANSVERSE LOOP COLOSTOMY;  Surgeon: Ronny Bacon, MD;  Location: [facility] ORS;  Service: General;  Laterality: N/A;    FAMILY HISTORY: Family History  Problem Relation Age of Onset   Heart disease Father     ADVANCED DIRECTIVES (Y/N):  N  HEALTH MAINTENANCE: Social History   Tobacco Use   Smoking status: Every Day    Packs/day: 0.50    Years: 36.00    Total pack years: 18.00    Types: Cigarettes   Smokeless tobacco: Never  Vaping Use   Vaping Use: Never used  Substance Use Topics   Alcohol use: No   Drug use: No     Colonoscopy:  PAP:  Bone density:  Lipid panel:  No Known Allergies  Current Outpatient Medications  Medication Sig Dispense Refill   aspirin 81 MG EC tablet Take 81 mg by mouth daily.       atorvastatin (LIPITOR) 40 MG tablet Take 1 tablet (40 mg total) by mouth daily. 90 tablet 3   cyanocobalamin (VITAMIN B12) 500 MCG tablet Take 500 mcg by mouth daily.     famotidine (PEPCID) 40 MG tablet Take 1 tablet (40 mg total) by mouth daily. 90 tablet 3   glimepiride (AMARYL) 4 MG tablet Take 4 mg by mouth daily with breakfast.     HYDROcodone-acetaminophen  (NORCO/VICODIN) 5-325 MG tablet Take 1 tablet by mouth every 6 (six) hours as needed for moderate pain.     ibuprofen (ADVIL) 800 MG tablet Take 1 tablet (800 mg total) by mouth every 8 (eight) hours as needed. 30 tablet 0   isosorbide mononitrate (IMDUR) 30 MG 24 hr tablet Take 1 tablet (30 mg total) by mouth daily. 90 tablet 3   lidocaine-prilocaine (EMLA) cream Apply to affected area once 30 g 3   metFORMIN (GLUCOPHAGE) 1000 MG tablet Take 1,000 mg by mouth 2 (two) times daily with a meal.     nitroGLYCERIN (NITROSTAT) 0.4 MG SL tablet Place 1 tablet (0.4 mg total) under the tongue every 5 (five) minutes as needed for chest pain. 25 tablet 6   ondansetron (ZOFRAN) 8 MG tablet Take 1 tablet (8 mg total) by mouth every 8 (eight) hours as needed for nausea or vomiting. 60 tablet 2   pioglitazone (ACTOS) 15 MG tablet Take 15 mg by mouth daily.     prochlorperazine (COMPAZINE) 10 MG tablet Take 1 tablet (10 mg total) by mouth every 6 (six) hours as needed for nausea or vomiting. 60 tablet 2   clopidogrel (PLAVIX) 75 MG tablet Take 75 mg by mouth daily. (Patient not taking: Reported on 09/01/2022)     glimepiride (AMARYL) 4 MG tablet Take 4 mg by mouth daily with breakfast.     metFORMIN (GLUCOPHAGE) 1000 MG tablet Take 1,000 mg by mouth 2 (two) times daily with a meal.      No current facility-administered medications for this visit.   Facility-Administered Medications Ordered in Other Visits  Medication Dose Route Frequency Provider Last Rate Last Admin   dexamethasone (DECADRON) 10 mg in sodium chloride 0.9 % 50 mL IVPB  10 mg Intravenous Once Lloyd Huger, MD 204 mL/hr at 10/20/22 0952 10 mg at 10/20/22 3810   fluorouracil (ADRUCIL) 4,850 mg in sodium chloride 0.9 % 53 mL chemo infusion  2,400 mg/m2 (Treatment Plan Recorded) Intravenous 1 day or 1 dose Lloyd Huger, MD       fluorouracil (ADRUCIL) chemo injection 800 mg  400 mg/m2 (Treatment Plan Recorded) Intravenous Once Lloyd Huger, MD       leucovorin 800 mg in dextrose 5 % 250 mL infusion  800 mg Intravenous Once Lloyd Huger, MD       oxaliplatin (ELOXATIN) 175 mg in dextrose 5 % 500 mL chemo infusion  85 mg/m2 (Treatment Plan Recorded) Intravenous Once Lloyd Huger, MD        OBJECTIVE: Vitals:   10/20/22 0842  BP: (!) 159/83  Pulse: 66  Resp: 16  Temp: 98.1 F (36.7 C)  SpO2: 100%     Body mass index is 25.33 kg/m.    ECOG FS:0 - Asymptomatic  General: Well-developed, well-nourished, no acute distress. Eyes: Pink conjunctiva, anicteric sclera. HEENT: Normocephalic, moist mucous membranes. Lungs: No audible wheezing or coughing. Heart:  Regular rate and rhythm. Abdomen: Soft, nontender, no obvious distention. Musculoskeletal: No edema, cyanosis, or clubbing. Neuro: Alert, answering all questions appropriately. Cranial nerves grossly intact. Skin: No rashes or petechiae noted. Psych: Normal affect.  LAB RESULTS:  Lab Results  Component Value Date   NA 139 10/20/2022   K 3.8 10/20/2022   CL 107 10/20/2022   CO2 23 10/20/2022   GLUCOSE 230 (H) 10/20/2022   BUN 13 10/20/2022   CREATININE 0.87 10/20/2022   CALCIUM 8.5 (L) 10/20/2022   PROT 6.5 10/20/2022   ALBUMIN 3.7 10/20/2022   AST 31 10/20/2022   ALT 35 10/20/2022   ALKPHOS 73 10/20/2022   BILITOT 0.5 10/20/2022   GFRNONAA >60 10/20/2022   GFRAA >60 12/20/2014    Lab Results  Component Value Date   WBC 9.4 10/20/2022   NEUTROABS 7.1 10/20/2022   HGB 9.1 (L) 10/20/2022   HCT 29.7 (L) 10/20/2022   MCV 83.7 10/20/2022   PLT 204 10/20/2022     STUDIES: No results found.  ASSESSMENT: Stage IIa rectal adenocarcinoma.  PLAN:    Stage IIa rectal adenocarcinoma: Diagnosis confirmed by imaging and biopsy.  MRI and CT scan results reviewed independently and reported as above confirming stage of disease.  Patient will benefit from neoadjuvant FOLFOX every 2 weeks x 8 cycles followed by concurrent Xeloda and daily  XRT.  Finally, patient will benefit from definitive surgical intervention of his residual malignancy.  Unclear if colostomy is reversible.  Patient has now had port placement.  Patient's platelets have recovered, therefore we will proceed with cycle 4 of FOLFOX today.  Return to clinic in 2 days for pump removal and then in 2 weeks for further evaluation and consideration of cycle 5.   Anemia: Chronic and unchanged.  Patient's hemoglobin is 9.1.   Neutropenia: Resolved. Continue Udenyca with pump off as needed.   Thrombocytopenia: Resolved.  Patient's platelet count is 204.  Proceed with treatment as above.  Patient expressed understanding and was in agreement with this plan. He also understands that He can call clinic at any time with any questions, concerns, or complaints.    Cancer Staging  Rectal adenocarcinoma [facility]) Staging form: Colon and Rectum, AJCC 8th Edition - Clinical stage from 08/13/2022: Stage IIA (cT3, cN0, cM0) - Signed by Lloyd Huger, MD on 08/13/2022 Total positive nodes: 0  Lloyd Huger, MD   10/20/2022 10:05 AM

## 2022-10-22 ENCOUNTER — Inpatient Hospital Stay: Payer: Medicare HMO

## 2022-10-22 VITALS — BP 169/81

## 2022-10-22 DIAGNOSIS — C20 Malignant neoplasm of rectum: Secondary | ICD-10-CM | POA: Diagnosis not present

## 2022-10-22 DIAGNOSIS — Z5111 Encounter for antineoplastic chemotherapy: Secondary | ICD-10-CM | POA: Diagnosis not present

## 2022-10-22 DIAGNOSIS — D696 Thrombocytopenia, unspecified: Secondary | ICD-10-CM | POA: Diagnosis not present

## 2022-10-22 DIAGNOSIS — T451X5A Adverse effect of antineoplastic and immunosuppressive drugs, initial encounter: Secondary | ICD-10-CM | POA: Diagnosis not present

## 2022-10-22 DIAGNOSIS — D6481 Anemia due to antineoplastic chemotherapy: Secondary | ICD-10-CM | POA: Diagnosis not present

## 2022-10-22 DIAGNOSIS — D509 Iron deficiency anemia, unspecified: Secondary | ICD-10-CM | POA: Diagnosis not present

## 2022-10-22 DIAGNOSIS — E119 Type 2 diabetes mellitus without complications: Secondary | ICD-10-CM | POA: Diagnosis not present

## 2022-10-22 DIAGNOSIS — I2510 Atherosclerotic heart disease of native coronary artery without angina pectoris: Secondary | ICD-10-CM | POA: Diagnosis not present

## 2022-10-22 DIAGNOSIS — K219 Gastro-esophageal reflux disease without esophagitis: Secondary | ICD-10-CM | POA: Diagnosis not present

## 2022-10-22 MED ORDER — PEGFILGRASTIM-CBQV 6 MG/0.6ML ~~LOC~~ SOSY
6.0000 mg | PREFILLED_SYRINGE | Freq: Once | SUBCUTANEOUS | Status: AC
  Administered 2022-10-22: 6 mg via SUBCUTANEOUS

## 2022-10-22 MED ORDER — SODIUM CHLORIDE 0.9% FLUSH
10.0000 mL | INTRAVENOUS | Status: DC | PRN
  Administered 2022-10-22: 10 mL
  Filled 2022-10-22: qty 10

## 2022-10-22 MED ORDER — HEPARIN SOD (PORK) LOCK FLUSH 100 UNIT/ML IV SOLN
500.0000 [IU] | Freq: Once | INTRAVENOUS | Status: AC | PRN
  Administered 2022-10-22: 500 [IU]
  Filled 2022-10-22: qty 5

## 2022-11-03 ENCOUNTER — Ambulatory Visit: Payer: Medicare HMO

## 2022-11-03 ENCOUNTER — Ambulatory Visit: Payer: Medicare HMO | Admitting: Oncology

## 2022-11-03 ENCOUNTER — Other Ambulatory Visit: Payer: Medicare HMO

## 2022-11-03 MED FILL — Dexamethasone Sodium Phosphate Inj 100 MG/10ML: INTRAMUSCULAR | Qty: 1 | Status: AC

## 2022-11-04 ENCOUNTER — Inpatient Hospital Stay (HOSPITAL_BASED_OUTPATIENT_CLINIC_OR_DEPARTMENT_OTHER): Payer: Medicare HMO | Admitting: Medical Oncology

## 2022-11-04 ENCOUNTER — Inpatient Hospital Stay: Payer: Medicare HMO

## 2022-11-04 ENCOUNTER — Encounter: Payer: Self-pay | Admitting: Medical Oncology

## 2022-11-04 VITALS — BP 150/83 | HR 71 | Temp 96.3°F | Ht 72.0 in | Wt 185.0 lb

## 2022-11-04 DIAGNOSIS — D696 Thrombocytopenia, unspecified: Secondary | ICD-10-CM | POA: Diagnosis not present

## 2022-11-04 DIAGNOSIS — K219 Gastro-esophageal reflux disease without esophagitis: Secondary | ICD-10-CM | POA: Diagnosis not present

## 2022-11-04 DIAGNOSIS — D509 Iron deficiency anemia, unspecified: Secondary | ICD-10-CM | POA: Diagnosis not present

## 2022-11-04 DIAGNOSIS — D6481 Anemia due to antineoplastic chemotherapy: Secondary | ICD-10-CM

## 2022-11-04 DIAGNOSIS — D6959 Other secondary thrombocytopenia: Secondary | ICD-10-CM

## 2022-11-04 DIAGNOSIS — I2510 Atherosclerotic heart disease of native coronary artery without angina pectoris: Secondary | ICD-10-CM | POA: Diagnosis not present

## 2022-11-04 DIAGNOSIS — T451X5A Adverse effect of antineoplastic and immunosuppressive drugs, initial encounter: Secondary | ICD-10-CM

## 2022-11-04 DIAGNOSIS — Z7689 Persons encountering health services in other specified circumstances: Secondary | ICD-10-CM

## 2022-11-04 DIAGNOSIS — C20 Malignant neoplasm of rectum: Secondary | ICD-10-CM | POA: Diagnosis not present

## 2022-11-04 DIAGNOSIS — Z5111 Encounter for antineoplastic chemotherapy: Secondary | ICD-10-CM | POA: Diagnosis not present

## 2022-11-04 DIAGNOSIS — E119 Type 2 diabetes mellitus without complications: Secondary | ICD-10-CM | POA: Diagnosis not present

## 2022-11-04 LAB — CBC WITH DIFFERENTIAL/PLATELET
Abs Immature Granulocytes: 0.15 10*3/uL — ABNORMAL HIGH (ref 0.00–0.07)
Basophils Absolute: 0.1 10*3/uL (ref 0.0–0.1)
Basophils Relative: 0 %
Eosinophils Absolute: 0.4 10*3/uL (ref 0.0–0.5)
Eosinophils Relative: 3 %
HCT: 29.6 % — ABNORMAL LOW (ref 39.0–52.0)
Hemoglobin: 9.4 g/dL — ABNORMAL LOW (ref 13.0–17.0)
Immature Granulocytes: 1 %
Lymphocytes Relative: 11 %
Lymphs Abs: 1.6 10*3/uL (ref 0.7–4.0)
MCH: 26.3 pg (ref 26.0–34.0)
MCHC: 31.8 g/dL (ref 30.0–36.0)
MCV: 82.9 fL (ref 80.0–100.0)
Monocytes Absolute: 0.9 10*3/uL (ref 0.1–1.0)
Monocytes Relative: 6 %
Neutro Abs: 11.4 10*3/uL — ABNORMAL HIGH (ref 1.7–7.7)
Neutrophils Relative %: 79 %
Platelets: 131 10*3/uL — ABNORMAL LOW (ref 150–400)
RBC: 3.57 MIL/uL — ABNORMAL LOW (ref 4.22–5.81)
RDW: 23.1 % — ABNORMAL HIGH (ref 11.5–15.5)
WBC: 14.4 10*3/uL — ABNORMAL HIGH (ref 4.0–10.5)
nRBC: 0 % (ref 0.0–0.2)

## 2022-11-04 LAB — COMPREHENSIVE METABOLIC PANEL
ALT: 25 U/L (ref 0–44)
AST: 30 U/L (ref 15–41)
Albumin: 3.9 g/dL (ref 3.5–5.0)
Alkaline Phosphatase: 123 U/L (ref 38–126)
Anion gap: 8 (ref 5–15)
BUN: 14 mg/dL (ref 8–23)
CO2: 24 mmol/L (ref 22–32)
Calcium: 9 mg/dL (ref 8.9–10.3)
Chloride: 105 mmol/L (ref 98–111)
Creatinine, Ser: 0.88 mg/dL (ref 0.61–1.24)
GFR, Estimated: >60 mL/min (ref >60)
Glucose, Bld: 207 mg/dL — ABNORMAL HIGH (ref 70–99)
Potassium: 3.8 mmol/L (ref 3.5–5.1)
Sodium: 137 mmol/L (ref 135–145)
Total Bilirubin: 0.5 mg/dL (ref 0.3–1.2)
Total Protein: 6.8 g/dL (ref 6.5–8.1)

## 2022-11-04 MED ORDER — PALONOSETRON HCL INJECTION 0.25 MG/5ML
0.2500 mg | Freq: Once | INTRAVENOUS | Status: AC
  Administered 2022-11-04: 0.25 mg via INTRAVENOUS
  Filled 2022-11-04: qty 5

## 2022-11-04 MED ORDER — SODIUM CHLORIDE 0.9 % IV SOLN
2400.0000 mg/m2 | INTRAVENOUS | Status: DC
  Administered 2022-11-04: 4850 mg via INTRAVENOUS
  Filled 2022-11-04: qty 97

## 2022-11-04 MED ORDER — LEUCOVORIN CALCIUM INJECTION 350 MG
800.0000 mg | Freq: Once | INTRAVENOUS | Status: AC
  Administered 2022-11-04: 800 mg via INTRAVENOUS
  Filled 2022-11-04: qty 40

## 2022-11-04 MED ORDER — OXALIPLATIN CHEMO INJECTION 100 MG/20ML
85.0000 mg/m2 | Freq: Once | INTRAVENOUS | Status: AC
  Administered 2022-11-04: 175 mg via INTRAVENOUS
  Filled 2022-11-04: qty 35

## 2022-11-04 MED ORDER — SODIUM CHLORIDE 0.9 % IV SOLN
10.0000 mg | Freq: Once | INTRAVENOUS | Status: AC
  Administered 2022-11-04: 10 mg via INTRAVENOUS
  Filled 2022-11-04: qty 10

## 2022-11-04 MED ORDER — FLUOROURACIL CHEMO INJECTION 2.5 GM/50ML
400.0000 mg/m2 | Freq: Once | INTRAVENOUS | Status: AC
  Administered 2022-11-04: 800 mg via INTRAVENOUS
  Filled 2022-11-04: qty 16

## 2022-11-04 MED ORDER — DEXTROSE 5 % IV SOLN
Freq: Once | INTRAVENOUS | Status: AC
  Filled 2022-11-04: qty 250

## 2022-11-04 NOTE — Progress Notes (Signed)
[place]  Telephone:(336) [phone] Fax:(336) [phone]  ID: [name] [name] OB: June 10, 1946  MR#: [id number]  EXB#:[id number]  Patient Care Team: [facility] as PCP - General [name], [name] November, MD as PCP - Cardiology (Cardiology) [name], MD as Consulting Physician (Cardiology) [name], RN as Oncology Nurse Navigator  CHIEF COMPLAINT: Stage IIa rectal adenocarcinoma.  INTERVAL HISTORY: Patient returns to clinic today for further evaluation and reconsideration of cycle 5 of 8 of neoadjuvant FOLFOX.    He reports that he feels really well. Mild fatigue but this is all he has noticed during treatments He has no neurologic complaints.  He denies any recent fevers or illnesses.  He has no chest pain, shortness of breath, cough, or hemoptysis.  He denies any nausea, vomiting, constipation, or diarrhea.  He does not report any melena or hematochezia.  He has no urinary complaints.  Patient offers no specific complaints today.  REVIEW OF SYSTEMS:   Review of Systems  Constitutional: Negative.  Negative for fever, malaise/fatigue and weight loss.  Respiratory: Negative.  Negative for cough, hemoptysis and shortness of breath.   Cardiovascular: Negative.  Negative for chest pain and leg swelling.  Gastrointestinal: Negative.  Negative for abdominal pain.  Genitourinary: Negative.  Negative for dysuria.  Musculoskeletal: Negative.  Negative for back pain.  Skin: Negative.  Negative for rash.  Neurological: Negative.  Negative for dizziness, focal weakness, weakness and headaches.  Psychiatric/Behavioral: Negative.  The patient is not nervous/anxious.     As per HPI. Otherwise, a complete review of systems is negative.  PAST MEDICAL HISTORY: Past Medical History:  Diagnosis Date   Anemia    Anginal pain (HCC)    Bradycardia    Cancer (HCC)    Coronary artery disease    x 1 stent   Depression    Diabetes mellitus without  complication (HCC)    GERD (gastroesophageal reflux disease)    History of kidney stones    Hyperlipidemia    Hypertension    Nephrolithiasis     PAST SURGICAL HISTORY: Past Surgical History:  Procedure Laterality Date   CARDIAC CATHETERIZATION     CATARACT EXTRACTION     CORONARY ANGIOPLASTY     FLEXIBLE SIGMOIDOSCOPY N/A 07/27/2022   Procedure: FLEXIBLE SIGMOIDOSCOPY;  Surgeon: Lesly Rubenstein, MD;  Location: [facility] ENDOSCOPY;  Service: Endoscopy;  Laterality: N/A;   KIDNEY STONE SURGERY     NECK SURGERY  12/18/2011   nephrolithiasis     PORTACATH PLACEMENT Right 08/24/2022   Procedure: INSERTION PORT-A-CATH;  Surgeon: Ronny Bacon, MD;  Location: [facility] ORS;  Service: General;  Laterality: Right;   stent (other)     TRANSVERSE LOOP COLOSTOMY N/A 07/28/2022   Procedure: TRANSVERSE LOOP COLOSTOMY;  Surgeon: Ronny Bacon, MD;  Location: [facility] ORS;  Service: General;  Laterality: N/A;    FAMILY HISTORY: Family History  Problem Relation Age of Onset   Heart disease Father     ADVANCED DIRECTIVES (Y/N):  N  HEALTH MAINTENANCE: Social History   Tobacco Use   Smoking status: Every Day    Packs/day: 0.50    Years: 36.00    Total pack years: 18.00    Types: Cigarettes   Smokeless tobacco: Never  Vaping Use   Vaping Use: Never used  Substance Use Topics   Alcohol use: No   Drug use: No     Colonoscopy:  PAP:  Bone density:  Lipid panel:  No Known Allergies  Current Outpatient Medications  Medication Sig Dispense Refill   aspirin 81 MG EC tablet Take 81 mg by mouth daily.       atorvastatin (LIPITOR) 40 MG tablet Take 1 tablet (40 mg total) by mouth daily. 90 tablet 3   cyanocobalamin (VITAMIN B12) 500 MCG tablet Take 500 mcg by mouth daily.     famotidine (PEPCID) 40 MG tablet Take 1 tablet (40 mg total) by mouth daily. 90 tablet 3   glimepiride (AMARYL) 4 MG tablet Take 4 mg by mouth daily with breakfast.     HYDROcodone-acetaminophen (NORCO/VICODIN)  5-325 MG tablet Take 1 tablet by mouth every 6 (six) hours as needed for moderate pain.     ibuprofen (ADVIL) 800 MG tablet Take 1 tablet (800 mg total) by mouth every 8 (eight) hours as needed. 30 tablet 0   isosorbide mononitrate (IMDUR) 30 MG 24 hr tablet Take 1 tablet (30 mg total) by mouth daily. 90 tablet 3   lidocaine-prilocaine (EMLA) cream Apply to affected area once 30 g 3   metFORMIN (GLUCOPHAGE) 1000 MG tablet Take 1,000 mg by mouth 2 (two) times daily with a meal.     nitroGLYCERIN (NITROSTAT) 0.4 MG SL tablet Place 1 tablet (0.4 mg total) under the tongue every 5 (five) minutes as needed for chest pain. 25 tablet 6   ondansetron (ZOFRAN) 8 MG tablet Take 1 tablet (8 mg total) by mouth every 8 (eight) hours as needed for nausea or vomiting. 60 tablet 2   pioglitazone (ACTOS) 15 MG tablet Take 15 mg by mouth daily.     prochlorperazine (COMPAZINE) 10 MG tablet Take 1 tablet (10 mg total) by mouth every 6 (six) hours as needed for nausea or vomiting. 60 tablet 2   clopidogrel (PLAVIX) 75 MG tablet Take 75 mg by mouth daily. (Patient not taking: Reported on 09/01/2022)     glimepiride (AMARYL) 4 MG tablet Take 4 mg by mouth daily with breakfast.     metFORMIN (GLUCOPHAGE) 1000 MG tablet Take 1,000 mg by mouth 2 (two) times daily with a meal.      No current facility-administered medications for this visit.   Facility-Administered Medications Ordered in Other Visits  Medication Dose Route Frequency Provider Last Rate Last Admin   dexamethasone (DECADRON) 10 mg in sodium chloride 0.9 % 50 mL IVPB  10 mg Intravenous Once Lloyd Huger, MD 204 mL/hr at 11/04/22 0929 10 mg at 11/04/22 0929   fluorouracil (ADRUCIL) 4,850 mg in sodium chloride 0.9 % 53 mL chemo infusion  2,400 mg/m2 (Treatment Plan Recorded) Intravenous 1 day or 1 dose Lloyd Huger, MD       fluorouracil (ADRUCIL) chemo injection 800 mg  400 mg/m2 (Treatment Plan Recorded) Intravenous Once Lloyd Huger, MD        leucovorin 800 mg in dextrose 5 % 250 mL infusion  800 mg Intravenous Once Lloyd Huger, MD       oxaliplatin (ELOXATIN) 175 mg in dextrose 5 % 500 mL chemo infusion  85 mg/m2 (Treatment Plan Recorded) Intravenous Once Lloyd Huger, MD        OBJECTIVE: Vitals:   11/04/22 0841  BP: (!) 150/83  Pulse: 71  Temp: (!) 96.3 F (35.7 C)     Body mass index is 25.09 kg/m.    ECOG FS:0 - Asymptomatic  General: Well-developed, well-nourished, no acute distress. Eyes: Pink conjunctiva, anicteric sclera. HEENT: Normocephalic, moist mucous membranes. Lungs: No audible wheezing or coughing. Heart: Regular rate and rhythm. Abdomen:  Soft, nontender, no obvious distention. Musculoskeletal: No edema, cyanosis, or clubbing. Neuro: Alert, answering all questions appropriately. Cranial nerves grossly intact. Skin: No rashes or petechiae noted. Psych: Normal affect.  LAB RESULTS:  Lab Results  Component Value Date   NA 137 11/04/2022   K 3.8 11/04/2022   CL 105 11/04/2022   CO2 24 11/04/2022   GLUCOSE 207 (H) 11/04/2022   BUN 14 11/04/2022   CREATININE 0.88 11/04/2022   CALCIUM 9.0 11/04/2022   PROT 6.8 11/04/2022   ALBUMIN 3.9 11/04/2022   AST 30 11/04/2022   ALT 25 11/04/2022   ALKPHOS 123 11/04/2022   BILITOT 0.5 11/04/2022   GFRNONAA >60 11/04/2022   GFRAA >60 12/20/2014    Lab Results  Component Value Date   WBC 14.4 (H) 11/04/2022   NEUTROABS 11.4 (H) 11/04/2022   HGB 9.4 (L) 11/04/2022   HCT 29.6 (L) 11/04/2022   MCV 82.9 11/04/2022   PLT 131 (L) 11/04/2022     STUDIES: No results found.  ASSESSMENT: Stage IIa rectal adenocarcinoma. Encounter Diagnoses  Name Primary?   Rectal adenocarcinoma ([place])    Anemia due to antineoplastic chemotherapy    Encounter for prophylaxis for neutropenia due to chemotherapy    Chemotherapy-induced thrombocytopenia    Encounter for antineoplastic chemotherapy Yes    PLAN:    Stage IIa rectal adenocarcinoma:  Diagnosis confirmed by imaging and biopsy.  MRI and CT scan results reviewed independently and reported as above confirming stage of disease.  Patient will benefit from neoadjuvant FOLFOX every 2 weeks x 8 cycles followed by concurrent Xeloda and daily XRT.  Finally, patient will benefit from definitive surgical intervention of his residual malignancy.  Unclear if colostomy is reversible.  Patient has now had port placement. Labs reviewed and ok to proceed with cycle 5 of FOLFOX today.  Return to clinic in 2 days for pump removal and then in 2 weeks for further evaluation and consideration of cycle 6.   Anemia: Chronic and stable.  Patient's hemoglobin is 9.4 which is up slightly from 9.1. We will continue to monitor.  Neutropenia: Resolved with Udenyca use. Continue Udenyca with pump off as needed.   Thrombocytopenia: Secondary to chemotherapy. Today his platelets are 131. No evidence of bleeding. Proceed forward with treatment.    Disposition: Cycle 5 of 8 Folfox today RTC 2 weeks MD, Labs (CBC w/, CMP) +- Cycle 6 of 8 Folfox   Patient expressed understanding and was in agreement with this plan. He also understands that He can call clinic at any time with any questions, concerns, or complaints.    Cancer Staging  Rectal adenocarcinoma [facility]) Staging form: Colon and Rectum, AJCC 8th Edition - Clinical stage from 08/13/2022: Stage IIA (cT3, cN0, cM0) - Signed by Lloyd Huger, MD on 08/13/2022 Total positive nodes: [place], PA-C   11/04/2022 9:34 AM

## 2022-11-04 NOTE — Patient Instructions (Signed)
[facility]  Discharge Instructions: Thank you for choosing [facility] to provide your oncology and hematology care.  If you have a lab appointment with the Cancer Center, please go directly to the Cancer Center and check in at the registration area.  Wear comfortable clothing and clothing appropriate for easy access to any Portacath or PICC line.   We strive to give you quality time with your provider. You may need to reschedule your appointment if you arrive late (15 or more minutes).  Arriving late affects you and other patients whose appointments are after yours.  Also, if you miss three or more appointments without notifying the office, you may be dismissed from the clinic at the provider's discretion.      For prescription refill requests, have your pharmacy contact our office and allow 72 hours for refills to be completed.    Today you received the following chemotherapy and/or immunotherapy agents Eloxatin, Leucovorin, & Adrucil      To help prevent nausea and vomiting after your treatment, we encourage you to take your nausea medication as directed.  BELOW ARE SYMPTOMS THAT SHOULD BE REPORTED IMMEDIATELY: *FEVER GREATER THAN 100.4 F (38 C) OR HIGHER *CHILLS OR SWEATING *NAUSEA AND VOMITING THAT IS NOT CONTROLLED WITH YOUR NAUSEA MEDICATION *UNUSUAL SHORTNESS OF BREATH *UNUSUAL BRUISING OR BLEEDING *URINARY PROBLEMS (pain or burning when urinating, or frequent urination) *BOWEL PROBLEMS (unusual diarrhea, constipation, pain near the anus) TENDERNESS IN MOUTH AND THROAT WITH OR WITHOUT PRESENCE OF ULCERS (sore throat, sores in mouth, or a toothache) UNUSUAL RASH, SWELLING OR PAIN  UNUSUAL VAGINAL DISCHARGE OR ITCHING   Items with * indicate a potential emergency and should be followed up as soon as possible or go to the Emergency Department if any problems should occur.  Please show the CHEMOTHERAPY ALERT CARD or IMMUNOTHERAPY ALERT  CARD at check-in to the Emergency Department and triage nurse.  Should you have questions after your visit or need to cancel or reschedule your appointment, please contact [facility] [place]  [phone] and follow the prompts.  Office hours are 8:00 a.m. to 4:30 p.m. Monday - Friday. Please note that voicemails left after 4:00 p.m. may not be returned until the following business day.  We are closed weekends and major holidays. You have access to a nurse at all times for urgent questions. Please call the main number to the clinic [phone] and follow the prompts.  For any non-urgent questions, you may also contact your provider using MyChart. We now offer e-Visits for anyone 18 and older to request care online for non-urgent symptoms. For details visit [email or web address].   Also download the MyChart app! Go to the app store, search "MyChart", open the app, select [place], and log in with your MyChart username and password.  Masks are optional in the cancer centers. If you would like for your care team to wear a mask while they are taking care of you, please let them know. For doctor visits, patients may have with them one support person who is at least 74 years old. At this time, visitors are not allowed in the infusion area.   

## 2022-11-06 ENCOUNTER — Inpatient Hospital Stay: Payer: Medicare HMO

## 2022-11-06 VITALS — BP 151/71 | HR 78 | Temp 97.7°F

## 2022-11-06 DIAGNOSIS — C20 Malignant neoplasm of rectum: Secondary | ICD-10-CM | POA: Diagnosis not present

## 2022-11-06 DIAGNOSIS — D6481 Anemia due to antineoplastic chemotherapy: Secondary | ICD-10-CM | POA: Diagnosis not present

## 2022-11-06 DIAGNOSIS — I2510 Atherosclerotic heart disease of native coronary artery without angina pectoris: Secondary | ICD-10-CM | POA: Diagnosis not present

## 2022-11-06 DIAGNOSIS — T451X5A Adverse effect of antineoplastic and immunosuppressive drugs, initial encounter: Secondary | ICD-10-CM | POA: Diagnosis not present

## 2022-11-06 DIAGNOSIS — D696 Thrombocytopenia, unspecified: Secondary | ICD-10-CM | POA: Diagnosis not present

## 2022-11-06 DIAGNOSIS — D509 Iron deficiency anemia, unspecified: Secondary | ICD-10-CM | POA: Diagnosis not present

## 2022-11-06 DIAGNOSIS — E119 Type 2 diabetes mellitus without complications: Secondary | ICD-10-CM | POA: Diagnosis not present

## 2022-11-06 DIAGNOSIS — K219 Gastro-esophageal reflux disease without esophagitis: Secondary | ICD-10-CM | POA: Diagnosis not present

## 2022-11-06 DIAGNOSIS — Z5111 Encounter for antineoplastic chemotherapy: Secondary | ICD-10-CM | POA: Diagnosis not present

## 2022-11-06 MED ORDER — SODIUM CHLORIDE 0.9% FLUSH
10.0000 mL | INTRAVENOUS | Status: DC | PRN
  Administered 2022-11-06: 10 mL
  Filled 2022-11-06: qty 10

## 2022-11-06 MED ORDER — HEPARIN SOD (PORK) LOCK FLUSH 100 UNIT/ML IV SOLN
500.0000 [IU] | Freq: Once | INTRAVENOUS | Status: AC | PRN
  Administered 2022-11-06: 500 [IU]
  Filled 2022-11-06: qty 5

## 2022-11-06 MED ORDER — PEGFILGRASTIM-CBQV 6 MG/0.6ML ~~LOC~~ SOSY
6.0000 mg | PREFILLED_SYRINGE | Freq: Once | SUBCUTANEOUS | Status: AC
  Administered 2022-11-06: 6 mg via SUBCUTANEOUS
  Filled 2022-11-06: qty 0.6

## 2022-11-06 NOTE — Patient Instructions (Signed)
[facility] AT [place]  Discharge Instructions: Thank you for choosing [facility] to provide your oncology and hematology care.  If you have a lab appointment with the [facility] [facility], please go directly to the [facility] and check in at the registration area.  Wear comfortable clothing and clothing appropriate for easy access to any Portacath or PICC line.   We strive to give you quality time with your provider. You may need to reschedule your appointment if you arrive late (15 or more minutes).  Arriving late affects you and other patients whose appointments are after yours.  Also, if you miss three or more appointments without notifying the office, you may be dismissed from the clinic at the provider's discretion.      For prescription refill requests, have your pharmacy contact our office and allow 72 hours for refills to be completed.    Today you received :Udenyca  To help prevent nausea and vomiting after your treatment, we encourage you to take your nausea medication as directed.  BELOW ARE SYMPTOMS THAT SHOULD BE REPORTED IMMEDIATELY: *FEVER GREATER THAN 100.4 F (38 C) OR HIGHER *CHILLS OR SWEATING *NAUSEA AND VOMITING THAT IS NOT CONTROLLED WITH YOUR NAUSEA MEDICATION *UNUSUAL SHORTNESS OF BREATH *UNUSUAL BRUISING OR BLEEDING *URINARY PROBLEMS (pain or burning when urinating, or frequent urination) *BOWEL PROBLEMS (unusual diarrhea, constipation, pain near the anus) TENDERNESS IN MOUTH AND THROAT WITH OR WITHOUT PRESENCE OF ULCERS (sore throat, sores in mouth, or a toothache) UNUSUAL RASH, SWELLING OR PAIN  UNUSUAL VAGINAL DISCHARGE OR ITCHING   Items with * indicate a potential emergency and should be followed up as soon as possible or go to the Emergency Department if any problems should occur.  Please show the CHEMOTHERAPY ALERT CARD or IMMUNOTHERAPY ALERT CARD at check-in to the Emergency Department and triage nurse.  Should you have  questions after your visit or need to cancel or reschedule your appointment, please contact [facility] CANCER [place] AT [place]  [phone] and follow the prompts.  Office hours are 8:00 a.m. to 4:30 p.m. Monday - Friday. Please note that voicemails left after 4:00 p.m. may not be returned until the following business day.  We are closed weekends and major holidays. You have access to a nurse at all times for urgent questions. Please call the main number to the clinic [phone] and follow the prompts.  For any non-urgent questions, you may also contact your provider using MyChart. We now offer e-Visits for anyone 26 and older to request care online for non-urgent symptoms. For details visit [email or web address].   Also download the MyChart app! Go to the app store, search "MyChart", open the app, select [place], and log in with your MyChart username and password.  Masks are optional in the cancer centers. If you would like for your care team to wear a mask while they are taking care of you, please let them know. For doctor visits, patients may have with them one support person who is at least 74 years old. At this time, visitors are not allowed in the infusion area.

## 2022-11-17 ENCOUNTER — Other Ambulatory Visit: Payer: Medicare HMO

## 2022-11-17 ENCOUNTER — Ambulatory Visit: Payer: Medicare HMO

## 2022-11-17 ENCOUNTER — Ambulatory Visit: Payer: Medicare HMO | Admitting: Oncology

## 2022-11-18 ENCOUNTER — Inpatient Hospital Stay: Payer: Medicare HMO

## 2022-11-18 ENCOUNTER — Inpatient Hospital Stay: Payer: Medicare HMO | Attending: Oncology

## 2022-11-18 ENCOUNTER — Inpatient Hospital Stay: Payer: Medicare HMO | Admitting: Oncology

## 2022-11-18 ENCOUNTER — Encounter: Payer: Self-pay | Admitting: Oncology

## 2022-11-18 DIAGNOSIS — K219 Gastro-esophageal reflux disease without esophagitis: Secondary | ICD-10-CM | POA: Insufficient documentation

## 2022-11-18 DIAGNOSIS — Z7982 Long term (current) use of aspirin: Secondary | ICD-10-CM | POA: Insufficient documentation

## 2022-11-18 DIAGNOSIS — C20 Malignant neoplasm of rectum: Secondary | ICD-10-CM | POA: Diagnosis not present

## 2022-11-18 DIAGNOSIS — Z7984 Long term (current) use of oral hypoglycemic drugs: Secondary | ICD-10-CM | POA: Insufficient documentation

## 2022-11-18 DIAGNOSIS — E785 Hyperlipidemia, unspecified: Secondary | ICD-10-CM | POA: Insufficient documentation

## 2022-11-18 DIAGNOSIS — D649 Anemia, unspecified: Secondary | ICD-10-CM | POA: Diagnosis not present

## 2022-11-18 DIAGNOSIS — Z79899 Other long term (current) drug therapy: Secondary | ICD-10-CM | POA: Diagnosis not present

## 2022-11-18 DIAGNOSIS — I2510 Atherosclerotic heart disease of native coronary artery without angina pectoris: Secondary | ICD-10-CM | POA: Insufficient documentation

## 2022-11-18 DIAGNOSIS — Z5111 Encounter for antineoplastic chemotherapy: Secondary | ICD-10-CM | POA: Insufficient documentation

## 2022-11-18 DIAGNOSIS — Z5189 Encounter for other specified aftercare: Secondary | ICD-10-CM | POA: Insufficient documentation

## 2022-11-18 DIAGNOSIS — E1165 Type 2 diabetes mellitus with hyperglycemia: Secondary | ICD-10-CM | POA: Insufficient documentation

## 2022-11-18 DIAGNOSIS — D696 Thrombocytopenia, unspecified: Secondary | ICD-10-CM | POA: Diagnosis not present

## 2022-11-18 DIAGNOSIS — F17210 Nicotine dependence, cigarettes, uncomplicated: Secondary | ICD-10-CM | POA: Diagnosis not present

## 2022-11-18 DIAGNOSIS — I10 Essential (primary) hypertension: Secondary | ICD-10-CM | POA: Diagnosis not present

## 2022-11-18 LAB — CBC WITH DIFFERENTIAL/PLATELET
Abs Immature Granulocytes: 0.32 10*3/uL — ABNORMAL HIGH (ref 0.00–0.07)
Basophils Absolute: 0 10*3/uL (ref 0.0–0.1)
Basophils Relative: 0 %
Eosinophils Absolute: 0.1 10*3/uL (ref 0.0–0.5)
Eosinophils Relative: 1 %
HCT: 29.2 % — ABNORMAL LOW (ref 39.0–52.0)
Hemoglobin: 9.1 g/dL — ABNORMAL LOW (ref 13.0–17.0)
Immature Granulocytes: 3 %
Lymphocytes Relative: 12 %
Lymphs Abs: 1.5 10*3/uL (ref 0.7–4.0)
MCH: 26.3 pg (ref 26.0–34.0)
MCHC: 31.2 g/dL (ref 30.0–36.0)
MCV: 84.4 fL (ref 80.0–100.0)
Monocytes Absolute: 0.8 10*3/uL (ref 0.1–1.0)
Monocytes Relative: 6 %
Neutro Abs: 9.8 10*3/uL — ABNORMAL HIGH (ref 1.7–7.7)
Neutrophils Relative %: 78 %
Platelets: 96 10*3/uL — ABNORMAL LOW (ref 150–400)
RBC: 3.46 MIL/uL — ABNORMAL LOW (ref 4.22–5.81)
RDW: 24.4 % — ABNORMAL HIGH (ref 11.5–15.5)
WBC: 12.5 10*3/uL — ABNORMAL HIGH (ref 4.0–10.5)
nRBC: 0.2 % (ref 0.0–0.2)

## 2022-11-18 LAB — COMPREHENSIVE METABOLIC PANEL
ALT: 30 U/L (ref 0–44)
AST: 32 U/L (ref 15–41)
Albumin: 3.8 g/dL (ref 3.5–5.0)
Alkaline Phosphatase: 130 U/L — ABNORMAL HIGH (ref 38–126)
Anion gap: 10 (ref 5–15)
BUN: 14 mg/dL (ref 8–23)
CO2: 24 mmol/L (ref 22–32)
Calcium: 8.6 mg/dL — ABNORMAL LOW (ref 8.9–10.3)
Chloride: 103 mmol/L (ref 98–111)
Creatinine, Ser: 0.88 mg/dL (ref 0.61–1.24)
GFR, Estimated: >60 mL/min (ref >60)
Glucose, Bld: 243 mg/dL — ABNORMAL HIGH (ref 70–99)
Potassium: 3.5 mmol/L (ref 3.5–5.1)
Sodium: 137 mmol/L (ref 135–145)
Total Bilirubin: 0.5 mg/dL (ref 0.3–1.2)
Total Protein: 7 g/dL (ref 6.5–8.1)

## 2022-11-18 MED ORDER — SODIUM CHLORIDE 0.9 % IV SOLN
10.0000 mg | Freq: Once | INTRAVENOUS | Status: AC
  Administered 2022-11-18: 10 mg via INTRAVENOUS
  Filled 2022-11-18: qty 1

## 2022-11-18 MED ORDER — OXALIPLATIN CHEMO INJECTION 100 MG/20ML
85.0000 mg/m2 | Freq: Once | INTRAVENOUS | Status: AC
  Administered 2022-11-18: 175 mg via INTRAVENOUS
  Filled 2022-11-18: qty 35

## 2022-11-18 MED ORDER — DEXTROSE 5 % IV SOLN
Freq: Once | INTRAVENOUS | Status: AC
  Filled 2022-11-18: qty 250

## 2022-11-18 MED ORDER — PALONOSETRON HCL INJECTION 0.25 MG/5ML
0.2500 mg | Freq: Once | INTRAVENOUS | Status: AC
  Administered 2022-11-18: 0.25 mg via INTRAVENOUS
  Filled 2022-11-18: qty 5

## 2022-11-18 MED ORDER — LEUCOVORIN CALCIUM INJECTION 350 MG
800.0000 mg | Freq: Once | INTRAVENOUS | Status: AC
  Administered 2022-11-18: 800 mg via INTRAVENOUS
  Filled 2022-11-18: qty 40

## 2022-11-18 MED ORDER — SODIUM CHLORIDE 0.9 % IV SOLN
2400.0000 mg/m2 | INTRAVENOUS | Status: DC
  Administered 2022-11-18: 4850 mg via INTRAVENOUS
  Filled 2022-11-18: qty 97

## 2022-11-18 MED ORDER — FLUOROURACIL CHEMO INJECTION 2.5 GM/50ML
400.0000 mg/m2 | Freq: Once | INTRAVENOUS | Status: AC
  Administered 2022-11-18: 800 mg via INTRAVENOUS
  Filled 2022-11-18: qty 16

## 2022-11-18 NOTE — Progress Notes (Signed)
[facility]  [phone] Fax:(336) [phone]  ID: [name] [name] OB: [birth year]  MR#: [id number]  AST#:[id number]  Patient Care Team: [facility] as PCP - General [name], [name] November, MD as PCP - Cardiology (Cardiology) [name], MD as Consulting Physician (Cardiology) [name], RN as Oncology Nurse Navigator Grayland Ormond, [name] November, MD as Consulting Physician (Oncology)  CHIEF COMPLAINT: Stage IIa rectal adenocarcinoma.  INTERVAL HISTORY: Patient returns to clinic today for further evaluation and consideration of cycle 6 of 8 of neoadjuvant FOLFOX.  He continues to tolerate his treatments well without significant side effects.  He currently feels well and is asymptomatic.  He has no neurologic complaints.  He denies any recent fevers or illnesses.  He has no chest pain, shortness of breath, cough, or hemoptysis.  He denies any nausea, vomiting, constipation, or diarrhea.  He does not report any melena or hematochezia.  He has no urinary complaints.  Patient offers no specific complaints today.  REVIEW OF SYSTEMS:   Review of Systems  Constitutional: Negative.  Negative for fever, malaise/fatigue and weight loss.  Respiratory: Negative.  Negative for cough, hemoptysis and shortness of breath.   Cardiovascular: Negative.  Negative for chest pain and leg swelling.  Gastrointestinal: Negative.  Negative for abdominal pain.  Genitourinary: Negative.  Negative for dysuria.  Musculoskeletal: Negative.  Negative for back pain.  Skin: Negative.  Negative for rash.  Neurological: Negative.  Negative for dizziness, focal weakness, weakness and headaches.  Psychiatric/Behavioral: Negative.  The patient is not nervous/anxious.     As per HPI. Otherwise, a complete review of systems is negative.  PAST MEDICAL HISTORY: Past Medical History:  Diagnosis Date   Anemia    Anginal pain (HCC)    Bradycardia    Cancer (HCC)    Coronary  artery disease    x 1 stent   Depression    Diabetes mellitus without complication (HCC)    GERD (gastroesophageal reflux disease)    History of kidney stones    Hyperlipidemia    Hypertension    Nephrolithiasis     PAST SURGICAL HISTORY: Past Surgical History:  Procedure Laterality Date   CARDIAC CATHETERIZATION     CATARACT EXTRACTION     CORONARY ANGIOPLASTY     FLEXIBLE SIGMOIDOSCOPY N/A 07/27/2022   Procedure: FLEXIBLE SIGMOIDOSCOPY;  Surgeon: Lesly Rubenstein, MD;  Location: [facility] ENDOSCOPY;  Service: Endoscopy;  Laterality: N/A;   KIDNEY STONE SURGERY     NECK SURGERY  12/18/2011   nephrolithiasis     PORTACATH PLACEMENT Right 08/24/2022   Procedure: INSERTION PORT-A-CATH;  Surgeon: Ronny Bacon, MD;  Location: [facility] ORS;  Service: General;  Laterality: Right;   stent (other)     TRANSVERSE LOOP COLOSTOMY N/A 07/28/2022   Procedure: TRANSVERSE LOOP COLOSTOMY;  Surgeon: Ronny Bacon, MD;  Location: [facility] ORS;  Service: General;  Laterality: N/A;    FAMILY HISTORY: Family History  Problem Relation Age of Onset   Heart disease Father     ADVANCED DIRECTIVES (Y/N):  N  HEALTH MAINTENANCE: Social History   Tobacco Use   Smoking status: Every Day    Packs/day: 0.50    Years: 36.00    Total pack years: 18.00    Types: Cigarettes   Smokeless tobacco: Never  Vaping Use   Vaping Use: Never used  Substance Use Topics   Alcohol use: No   Drug use: No     Colonoscopy:  PAP:  Bone density:  Lipid panel:  No Known Allergies  Current Outpatient Medications  Medication Sig Dispense Refill   aspirin 81 MG EC tablet Take 81 mg by mouth daily.       atorvastatin (LIPITOR) 40 MG tablet Take 1 tablet (40 mg total) by mouth daily. 90 tablet 3   cyanocobalamin (VITAMIN B12) 500 MCG tablet Take 500 mcg by mouth daily.     famotidine (PEPCID) 40 MG tablet Take 1 tablet (40 mg total) by mouth daily. 90 tablet 3   glimepiride (AMARYL) 4 MG tablet Take 4 mg by  mouth daily with breakfast.     glimepiride (AMARYL) 4 MG tablet Take 4 mg by mouth daily with breakfast.     HYDROcodone-acetaminophen (NORCO/VICODIN) 5-325 MG tablet Take 1 tablet by mouth every 6 (six) hours as needed for moderate pain.     ibuprofen (ADVIL) 800 MG tablet Take 1 tablet (800 mg total) by mouth every 8 (eight) hours as needed. 30 tablet 0   isosorbide mononitrate (IMDUR) 30 MG 24 hr tablet Take 1 tablet (30 mg total) by mouth daily. 90 tablet 3   lidocaine-prilocaine (EMLA) cream Apply to affected area once 30 g 3   metFORMIN (GLUCOPHAGE) 1000 MG tablet Take 1,000 mg by mouth 2 (two) times daily with a meal.     nitroGLYCERIN (NITROSTAT) 0.4 MG SL tablet Place 1 tablet (0.4 mg total) under the tongue every 5 (five) minutes as needed for chest pain. 25 tablet 6   ondansetron (ZOFRAN) 8 MG tablet Take 1 tablet (8 mg total) by mouth every 8 (eight) hours as needed for nausea or vomiting. 60 tablet 2   pioglitazone (ACTOS) 15 MG tablet Take 15 mg by mouth daily.     prochlorperazine (COMPAZINE) 10 MG tablet Take 1 tablet (10 mg total) by mouth every 6 (six) hours as needed for nausea or vomiting. 60 tablet 2   clopidogrel (PLAVIX) 75 MG tablet Take 75 mg by mouth daily. (Patient not taking: Reported on 09/01/2022)     metFORMIN (GLUCOPHAGE) 1000 MG tablet Take 1,000 mg by mouth 2 (two) times daily with a meal.      No current facility-administered medications for this visit.    OBJECTIVE: Vitals:   11/18/22 0843  BP: (!) 147/98  Pulse: 72  Resp: 16  Temp: (!) 96.9 F (36.1 C)  SpO2: 100%     Body mass index is 25.7 kg/m.    ECOG FS:0 - Asymptomatic  General: Well-developed, well-nourished, no acute distress. Eyes: Pink conjunctiva, anicteric sclera. HEENT: Normocephalic, moist mucous membranes. Lungs: No audible wheezing or coughing. Heart: Regular rate and rhythm. Abdomen: Soft, nontender, no obvious distention. Musculoskeletal: No edema, cyanosis, or  clubbing. Neuro: Alert, answering all questions appropriately. Cranial nerves grossly intact. Skin: No rashes or petechiae noted. Psych: Normal affect.  LAB RESULTS:  Lab Results  Component Value Date   NA 137 11/18/2022   K 3.5 11/18/2022   CL 103 11/18/2022   CO2 24 11/18/2022   GLUCOSE 243 (H) 11/18/2022   BUN 14 11/18/2022   CREATININE 0.88 11/18/2022   CALCIUM 8.6 (L) 11/18/2022   PROT 7.0 11/18/2022   ALBUMIN 3.8 11/18/2022   AST 32 11/18/2022   ALT 30 11/18/2022   ALKPHOS 130 (H) 11/18/2022   BILITOT 0.5 11/18/2022   GFRNONAA >60 11/18/2022   GFRAA >60 12/20/2014    Lab Results  Component Value Date   WBC 12.5 (H) 11/18/2022   NEUTROABS 9.8 (H) 11/18/2022   HGB 9.1 (L) 11/18/2022  HCT 29.2 (L) 11/18/2022   MCV 84.4 11/18/2022   PLT 96 (L) 11/18/2022     STUDIES: No results found.  ASSESSMENT: Stage IIa rectal adenocarcinoma.  PLAN:    Stage IIa rectal adenocarcinoma: Diagnosis confirmed by imaging and biopsy.  MRI and CT scan results reviewed independently and reported as above confirming stage of disease.  Patient will benefit from neoadjuvant FOLFOX every 2 weeks x 8 cycles followed by concurrent Xeloda and daily XRT.  Finally, patient will benefit from definitive surgical intervention of his residual malignancy.  Unclear if colostomy is reversible.  Patient has now had port placement.  Despite mild thrombocytopenia, will proceed with cycle 6 of FOLFOX today.  Return to clinic in 2 days for pump removal and then in 3 weeks for further evaluation and consideration of cycle 7.   Anemia: Chronic and unchanged.  Patient's hemoglobin is stable at 9.1.  Neutropenia: Resolved. Continue Udenyca with pump off as needed.   Thrombocytopenia: Mild.  Proceed with cycle 6 as scheduled.  Will delay cycle seven 1 week and patient will return to clinic in 3 weeks as above.  Patient expressed understanding and was in agreement with this plan. He also understands that He can  call clinic at any time with any questions, concerns, or complaints.    Cancer Staging  Rectal adenocarcinoma [facility]) Staging form: Colon and Rectum, AJCC 8th Edition - Clinical stage from 08/13/2022: Stage IIA (cT3, cN0, cM0) - Signed by Lloyd Huger, MD on 08/13/2022 Total positive nodes: 0  Lloyd Huger, MD   11/18/2022 9:11 AM

## 2022-11-18 NOTE — Patient Instructions (Signed)
[facility]CER CTR AT [place]  Discharge Instructions: Thank you for choosing [facility] to provide your oncology and hematology care.  If you have a lab appointment with the [facility], please go directly to the [facility] and check in at the registration area.  Wear comfortable clothing and clothing appropriate for easy access to any Portacath or PICC line.   We strive to give you quality time with your provider. You may need to reschedule your appointment if you arrive late (15 or more minutes).  Arriving late affects you and other patients whose appointments are after yours.  Also, if you miss three or more appointments without notifying the office, you may be dismissed from the clinic at the provider's discretion.      For prescription refill requests, have your pharmacy contact our office and allow 72 hours for refills to be completed.    Today you received the following chemotherapy and/or immunotherapy agents- oxaliplatin, leucovorin, 5FU      To help prevent nausea and vomiting after your treatment, we encourage you to take your nausea medication as directed.  BELOW ARE SYMPTOMS THAT SHOULD BE REPORTED IMMEDIATELY: *FEVER GREATER THAN 100.4 F (38 C) OR HIGHER *CHILLS OR SWEATING *NAUSEA AND VOMITING THAT IS NOT CONTROLLED WITH YOUR NAUSEA MEDICATION *UNUSUAL SHORTNESS OF BREATH *UNUSUAL BRUISING OR BLEEDING *URINARY PROBLEMS (pain or burning when urinating, or frequent urination) *BOWEL PROBLEMS (unusual diarrhea, constipation, pain near the anus) TENDERNESS IN MOUTH AND THROAT WITH OR WITHOUT PRESENCE OF ULCERS (sore throat, sores in mouth, or a toothache) UNUSUAL RASH, SWELLING OR PAIN  UNUSUAL VAGINAL DISCHARGE OR ITCHING   Items with * indicate a potential emergency and should be followed up as soon as possible or go to the Emergency Department if any problems should occur.  Please show the CHEMOTHERAPY ALERT CARD or IMMUNOTHERAPY ALERT  CARD at check-in to the Emergency Department and triage nurse.  Should you have questions after your visit or need to cancel or reschedule your appointment, please contact [facility] [place] AT [place]  [phone] and follow the prompts.  Office hours are 8:00 a.m. to 4:30 p.m. Monday - Friday. Please note that voicemails left after 4:00 p.m. may not be returned until the following business day.  We are closed weekends and major holidays. You have access to a nurse at all times for urgent questions. Please call the main number to the clinic [phone] and follow the prompts.  For any non-urgent questions, you may also contact your provider using MyChart. We now offer e-Visits for anyone 15 and older to request care online for non-urgent symptoms. For details visit [email or web address].   Also download the MyChart app! Go to the app store, search "MyChart", open the app, select [place], and log in with your MyChart username and password.

## 2022-11-20 ENCOUNTER — Inpatient Hospital Stay: Payer: Medicare HMO

## 2022-11-20 VITALS — BP 150/75

## 2022-11-20 DIAGNOSIS — C20 Malignant neoplasm of rectum: Secondary | ICD-10-CM | POA: Diagnosis not present

## 2022-11-20 DIAGNOSIS — K219 Gastro-esophageal reflux disease without esophagitis: Secondary | ICD-10-CM | POA: Diagnosis not present

## 2022-11-20 DIAGNOSIS — E785 Hyperlipidemia, unspecified: Secondary | ICD-10-CM | POA: Diagnosis not present

## 2022-11-20 DIAGNOSIS — D649 Anemia, unspecified: Secondary | ICD-10-CM | POA: Diagnosis not present

## 2022-11-20 DIAGNOSIS — E1165 Type 2 diabetes mellitus with hyperglycemia: Secondary | ICD-10-CM | POA: Diagnosis not present

## 2022-11-20 DIAGNOSIS — Z5111 Encounter for antineoplastic chemotherapy: Secondary | ICD-10-CM | POA: Diagnosis not present

## 2022-11-20 DIAGNOSIS — Z5189 Encounter for other specified aftercare: Secondary | ICD-10-CM | POA: Diagnosis not present

## 2022-11-20 DIAGNOSIS — D696 Thrombocytopenia, unspecified: Secondary | ICD-10-CM | POA: Diagnosis not present

## 2022-11-20 DIAGNOSIS — I2510 Atherosclerotic heart disease of native coronary artery without angina pectoris: Secondary | ICD-10-CM | POA: Diagnosis not present

## 2022-11-20 MED ORDER — SODIUM CHLORIDE 0.9% FLUSH
10.0000 mL | INTRAVENOUS | Status: DC | PRN
  Administered 2022-11-20: 10 mL
  Filled 2022-11-20: qty 10

## 2022-11-20 MED ORDER — PEGFILGRASTIM-CBQV 6 MG/0.6ML ~~LOC~~ SOSY
6.0000 mg | PREFILLED_SYRINGE | Freq: Once | SUBCUTANEOUS | Status: AC
  Administered 2022-11-20: 6 mg via SUBCUTANEOUS
  Filled 2022-11-20: qty 0.6

## 2022-11-20 MED ORDER — HEPARIN SOD (PORK) LOCK FLUSH 100 UNIT/ML IV SOLN
500.0000 [IU] | Freq: Once | INTRAVENOUS | Status: AC | PRN
  Administered 2022-11-20: 500 [IU]
  Filled 2022-11-20: qty 5

## 2022-12-02 ENCOUNTER — Telehealth: Payer: Self-pay | Admitting: *Deleted

## 2022-12-02 ENCOUNTER — Ambulatory Visit: Payer: Medicare HMO

## 2022-12-02 ENCOUNTER — Ambulatory Visit: Payer: Medicare HMO | Admitting: Oncology

## 2022-12-02 ENCOUNTER — Other Ambulatory Visit: Payer: Medicare HMO

## 2022-12-02 NOTE — Telephone Encounter (Signed)
Call returned to [facility] and informed that they need to contact patient surgeon Dr Christian Mate for orders

## 2022-12-02 NOTE — Telephone Encounter (Signed)
Call from [facility] asking if Dr Grayland Ormond is the correct physician to send ostomy supply orders to or not. Please advise  When calling back, Ref # [id number]

## 2022-12-05 DIAGNOSIS — Z933 Colostomy status: Secondary | ICD-10-CM | POA: Diagnosis not present

## 2022-12-05 DIAGNOSIS — Z452 Encounter for adjustment and management of vascular access device: Secondary | ICD-10-CM | POA: Diagnosis not present

## 2022-12-08 MED FILL — Dexamethasone Sodium Phosphate Inj 100 MG/10ML: INTRAMUSCULAR | Qty: 1 | Status: AC

## 2022-12-09 ENCOUNTER — Telehealth: Payer: Self-pay

## 2022-12-09 ENCOUNTER — Inpatient Hospital Stay: Payer: Medicare HMO

## 2022-12-09 ENCOUNTER — Other Ambulatory Visit (HOSPITAL_COMMUNITY): Payer: Self-pay

## 2022-12-09 ENCOUNTER — Telehealth: Payer: Self-pay | Admitting: Pharmacist

## 2022-12-09 ENCOUNTER — Encounter: Payer: Self-pay | Admitting: Oncology

## 2022-12-09 ENCOUNTER — Inpatient Hospital Stay: Payer: Medicare HMO | Admitting: Oncology

## 2022-12-09 DIAGNOSIS — K219 Gastro-esophageal reflux disease without esophagitis: Secondary | ICD-10-CM | POA: Diagnosis not present

## 2022-12-09 DIAGNOSIS — Z5189 Encounter for other specified aftercare: Secondary | ICD-10-CM | POA: Diagnosis not present

## 2022-12-09 DIAGNOSIS — C20 Malignant neoplasm of rectum: Secondary | ICD-10-CM | POA: Diagnosis not present

## 2022-12-09 DIAGNOSIS — Z5111 Encounter for antineoplastic chemotherapy: Secondary | ICD-10-CM | POA: Diagnosis not present

## 2022-12-09 DIAGNOSIS — I2510 Atherosclerotic heart disease of native coronary artery without angina pectoris: Secondary | ICD-10-CM | POA: Diagnosis not present

## 2022-12-09 DIAGNOSIS — E1165 Type 2 diabetes mellitus with hyperglycemia: Secondary | ICD-10-CM | POA: Diagnosis not present

## 2022-12-09 DIAGNOSIS — E785 Hyperlipidemia, unspecified: Secondary | ICD-10-CM | POA: Diagnosis not present

## 2022-12-09 DIAGNOSIS — D649 Anemia, unspecified: Secondary | ICD-10-CM | POA: Diagnosis not present

## 2022-12-09 DIAGNOSIS — D696 Thrombocytopenia, unspecified: Secondary | ICD-10-CM | POA: Diagnosis not present

## 2022-12-09 LAB — COMPREHENSIVE METABOLIC PANEL
ALT: 27 U/L (ref 0–44)
AST: 31 U/L (ref 15–41)
Albumin: 3.6 g/dL (ref 3.5–5.0)
Alkaline Phosphatase: 112 U/L (ref 38–126)
Anion gap: 11 (ref 5–15)
BUN: 18 mg/dL (ref 8–23)
CO2: 24 mmol/L (ref 22–32)
Calcium: 8.9 mg/dL (ref 8.9–10.3)
Chloride: 104 mmol/L (ref 98–111)
Creatinine, Ser: 0.96 mg/dL (ref 0.61–1.24)
GFR, Estimated: >60 mL/min (ref >60)
Glucose, Bld: 197 mg/dL — ABNORMAL HIGH (ref 70–99)
Potassium: 3.9 mmol/L (ref 3.5–5.1)
Sodium: 139 mmol/L (ref 135–145)
Total Bilirubin: 0.4 mg/dL (ref 0.3–1.2)
Total Protein: 6.7 g/dL (ref 6.5–8.1)

## 2022-12-09 LAB — CBC WITH DIFFERENTIAL/PLATELET
Abs Immature Granulocytes: 0.07 10*3/uL (ref 0.00–0.07)
Basophils Absolute: 0.1 10*3/uL (ref 0.0–0.1)
Basophils Relative: 1 %
Eosinophils Absolute: 0.1 10*3/uL (ref 0.0–0.5)
Eosinophils Relative: 1 %
HCT: 29.8 % — ABNORMAL LOW (ref 39.0–52.0)
Hemoglobin: 9.1 g/dL — ABNORMAL LOW (ref 13.0–17.0)
Immature Granulocytes: 1 %
Lymphocytes Relative: 11 %
Lymphs Abs: 1.2 10*3/uL (ref 0.7–4.0)
MCH: 27.6 pg (ref 26.0–34.0)
MCHC: 30.5 g/dL (ref 30.0–36.0)
MCV: 90.3 fL (ref 80.0–100.0)
Monocytes Absolute: 0.7 10*3/uL (ref 0.1–1.0)
Monocytes Relative: 7 %
Neutro Abs: 8.3 10*3/uL — ABNORMAL HIGH (ref 1.7–7.7)
Neutrophils Relative %: 79 %
Platelets: 202 10*3/uL (ref 150–400)
RBC: 3.3 MIL/uL — ABNORMAL LOW (ref 4.22–5.81)
RDW: 26.4 % — ABNORMAL HIGH (ref 11.5–15.5)
WBC: 10.3 10*3/uL (ref 4.0–10.5)
nRBC: 0 % (ref 0.0–0.2)

## 2022-12-09 MED ORDER — SODIUM CHLORIDE 0.9% FLUSH
10.0000 mL | Freq: Once | INTRAVENOUS | Status: DC
  Filled 2022-12-09: qty 10

## 2022-12-09 MED ORDER — FLUOROURACIL CHEMO INJECTION 2.5 GM/50ML
400.0000 mg/m2 | Freq: Once | INTRAVENOUS | Status: AC
  Administered 2022-12-09: 800 mg via INTRAVENOUS
  Filled 2022-12-09: qty 16

## 2022-12-09 MED ORDER — PALONOSETRON HCL INJECTION 0.25 MG/5ML
0.2500 mg | Freq: Once | INTRAVENOUS | Status: AC
  Administered 2022-12-09: 0.25 mg via INTRAVENOUS
  Filled 2022-12-09: qty 5

## 2022-12-09 MED ORDER — SODIUM CHLORIDE 0.9 % IV SOLN
10.0000 mg | Freq: Once | INTRAVENOUS | Status: AC
  Administered 2022-12-09: 10 mg via INTRAVENOUS
  Filled 2022-12-09: qty 10

## 2022-12-09 MED ORDER — HEPARIN SOD (PORK) LOCK FLUSH 100 UNIT/ML IV SOLN
500.0000 [IU] | Freq: Once | INTRAVENOUS | Status: DC
  Filled 2022-12-09: qty 5

## 2022-12-09 MED ORDER — DEXTROSE 5 % IV SOLN
Freq: Once | INTRAVENOUS | Status: AC
  Filled 2022-12-09: qty 250

## 2022-12-09 MED ORDER — SODIUM CHLORIDE 0.9 % IV SOLN
2400.0000 mg/m2 | INTRAVENOUS | Status: DC
  Administered 2022-12-09: 4850 mg via INTRAVENOUS
  Filled 2022-12-09: qty 97

## 2022-12-09 MED ORDER — LEUCOVORIN CALCIUM INJECTION 350 MG
400.0000 mg/m2 | Freq: Once | INTRAVENOUS | Status: AC
  Administered 2022-12-09: 812 mg via INTRAVENOUS
  Filled 2022-12-09: qty 40.6

## 2022-12-09 MED ORDER — OXALIPLATIN CHEMO INJECTION 100 MG/20ML
85.0000 mg/m2 | Freq: Once | INTRAVENOUS | Status: AC
  Administered 2022-12-09: 175 mg via INTRAVENOUS
  Filled 2022-12-09: qty 35

## 2022-12-09 NOTE — Patient Instructions (Signed)
[facility] [place] [facility]  Discharge Instructions: Thank you for choosing [facility] to provide your oncology and hematology care.  If you have a lab appointment with the Cancer Center, please go directly to the Cancer Center and check in at the registration area.  Wear comfortable clothing and clothing appropriate for easy access to any Portacath or PICC line.   We strive to give you quality time with your provider. You may need to reschedule your appointment if you arrive late (15 or more minutes).  Arriving late affects you and other patients whose appointments are after yours.  Also, if you miss three or more appointments without notifying the office, you may be dismissed from the clinic at the provider's discretion.      For prescription refill requests, have your pharmacy contact our office and allow 72 hours for refills to be completed.    Today you received the following chemotherapy and/or immunotherapy agents- oxaliplatin, leucovorin, 5FU      To help prevent nausea and vomiting after your treatment, we encourage you to take your nausea medication as directed.  BELOW ARE SYMPTOMS THAT SHOULD BE REPORTED IMMEDIATELY: *FEVER GREATER THAN 100.4 F (38 C) OR HIGHER *CHILLS OR SWEATING *NAUSEA AND VOMITING THAT IS NOT CONTROLLED WITH YOUR NAUSEA MEDICATION *UNUSUAL SHORTNESS OF BREATH *UNUSUAL BRUISING OR BLEEDING *URINARY PROBLEMS (pain or burning when urinating, or frequent urination) *BOWEL PROBLEMS (unusual diarrhea, constipation, pain near the anus) TENDERNESS IN MOUTH AND THROAT WITH OR WITHOUT PRESENCE OF ULCERS (sore throat, sores in mouth, or a toothache) UNUSUAL RASH, SWELLING OR PAIN  UNUSUAL VAGINAL DISCHARGE OR ITCHING   Items with * indicate a potential emergency and should be followed up as soon as possible or go to the Emergency Department if any problems should occur.  Please show the CHEMOTHERAPY ALERT CARD or IMMUNOTHERAPY ALERT  CARD at check-in to the Emergency Department and triage nurse.  Should you have questions after your visit or need to cancel or reschedule your appointment, please contact [facility] AT [place] [facility]  [phone] and follow the prompts.  Office hours are 8:00 a.m. to 4:30 p.m. Monday - Friday. Please note that voicemails left after 4:00 p.m. may not be returned until the following business day.  We are closed weekends and major holidays. You have access to a nurse at all times for urgent questions. Please call the main number to the clinic [phone] and follow the prompts.  For any non-urgent questions, you may also contact your provider using MyChart. We now offer e-Visits for anyone 18 and older to request care online for non-urgent symptoms. For details visit [email or web address].   Also download the MyChart app! Go to the app store, search "MyChart", open the app, select [place], and log in with your MyChart username and password.    

## 2022-12-09 NOTE — Telephone Encounter (Addendum)
Oral Oncology Patient Advocate Encounter  Prior Authorization for Capecitabine has been approved.    PA# [id number]  Effective dates: 12/09/22 through 11/16/23  Patients co-pay is $16.80.    Berdine Addison, [facility] Patient [place]  [phone] (phone) [phone] (fax) 12/09/2022 3:52 PM

## 2022-12-09 NOTE — Telephone Encounter (Signed)
Oral Oncology Patient Advocate Encounter   Received notification that prior authorization for Capecitabine is required.   PA submitted on 12/09/22  Key [id number]  Status is pending     Berdine Addison, [place] Patient [facility]  [phone] (phone) [phone] (fax) 12/09/2022 2:59 PM

## 2022-12-09 NOTE — Progress Notes (Signed)
[place]  Telephone:([phone]) [phone] Fax:(336) [phone]  ID: [name] [name] OB: Jan 26, 1946  MR#: [id number]  KVQ#:[id number]  Patient Care Team: [facility] as PCP - General [name], [name] November, MD as PCP - Cardiology (Cardiology) [name], MD as Consulting Physician (Cardiology) [name], RN as Oncology Nurse Navigator Grayland Ormond, [name] November, MD as Consulting Physician (Oncology)  CHIEF COMPLAINT: Stage IIa rectal adenocarcinoma.  INTERVAL HISTORY: Patient returns to clinic today for further evaluation and consideration of cycle 7 of 8 of neoadjuvant FOLFOX.  He currently feels well and is asymptomatic.  He continues to tolerate his treatments well without significant side effects. He has no neurologic complaints.  He denies any recent fevers or illnesses.  He has no chest pain, shortness of breath, cough, or hemoptysis.  He denies any nausea, vomiting, constipation, or diarrhea.  He does not report any melena or hematochezia.  He has no urinary complaints.  Patient offers no specific complaints today.  REVIEW OF SYSTEMS:   Review of Systems  Constitutional: Negative.  Negative for fever, malaise/fatigue and weight loss.  Respiratory: Negative.  Negative for cough, hemoptysis and shortness of breath.   Cardiovascular: Negative.  Negative for chest pain and leg swelling.  Gastrointestinal: Negative.  Negative for abdominal pain.  Genitourinary: Negative.  Negative for dysuria.  Musculoskeletal: Negative.  Negative for back pain.  Skin: Negative.  Negative for rash.  Neurological: Negative.  Negative for dizziness, focal weakness, weakness and headaches.  Psychiatric/Behavioral: Negative.  The patient is not nervous/anxious.     As per HPI. Otherwise, a complete review of systems is negative.  PAST MEDICAL HISTORY: Past Medical History:  Diagnosis Date   Anemia    Anginal pain (HCC)    Bradycardia    Cancer (HCC)    Coronary  artery disease    x 1 stent   Depression    Diabetes mellitus without complication (HCC)    GERD (gastroesophageal reflux disease)    History of kidney stones    Hyperlipidemia    Hypertension    Nephrolithiasis     PAST SURGICAL HISTORY: Past Surgical History:  Procedure Laterality Date   CARDIAC CATHETERIZATION     CATARACT EXTRACTION     CORONARY ANGIOPLASTY     FLEXIBLE SIGMOIDOSCOPY N/A 07/27/2022   Procedure: FLEXIBLE SIGMOIDOSCOPY;  Surgeon: Lesly Rubenstein, MD;  Location: [facility] ENDOSCOPY;  Service: Endoscopy;  Laterality: N/A;   KIDNEY STONE SURGERY     NECK SURGERY  12/18/2011   nephrolithiasis     PORTACATH PLACEMENT Right 08/24/2022   Procedure: INSERTION PORT-A-CATH;  Surgeon: Ronny Bacon, MD;  Location: [facility] ORS;  Service: General;  Laterality: Right;   stent (other)     TRANSVERSE LOOP COLOSTOMY N/A 07/28/2022   Procedure: TRANSVERSE LOOP COLOSTOMY;  Surgeon: Ronny Bacon, MD;  Location: [facility] ORS;  Service: General;  Laterality: N/A;    FAMILY HISTORY: Family History  Problem Relation Age of Onset   Heart disease Father     ADVANCED DIRECTIVES (Y/N):  N  HEALTH MAINTENANCE: Social History   Tobacco Use   Smoking status: Every Day    Packs/day: 0.50    Years: 36.00    Total pack years: 18.00    Types: Cigarettes   Smokeless tobacco: Never  Vaping Use   Vaping Use: Never used  Substance Use Topics   Alcohol use: No   Drug use: No     Colonoscopy:  PAP:  Bone density:  Lipid panel:  No Known Allergies  Current Outpatient Medications  Medication Sig Dispense Refill   aspirin 81 MG EC tablet Take 81 mg by mouth daily.       atorvastatin (LIPITOR) 40 MG tablet Take 1 tablet (40 mg total) by mouth daily. 90 tablet 3   cyanocobalamin (VITAMIN B12) 500 MCG tablet Take 500 mcg by mouth daily.     famotidine (PEPCID) 40 MG tablet Take 1 tablet (40 mg total) by mouth daily. 90 tablet 3   glimepiride (AMARYL) 4 MG tablet Take 4 mg by  mouth daily with breakfast.     HYDROcodone-acetaminophen (NORCO/VICODIN) 5-325 MG tablet Take 1 tablet by mouth every 6 (six) hours as needed for moderate pain.     ibuprofen (ADVIL) 800 MG tablet Take 1 tablet (800 mg total) by mouth every 8 (eight) hours as needed. 30 tablet 0   isosorbide mononitrate (IMDUR) 30 MG 24 hr tablet Take 1 tablet (30 mg total) by mouth daily. 90 tablet 3   lidocaine-prilocaine (EMLA) cream Apply to affected area once 30 g 3   metFORMIN (GLUCOPHAGE) 1000 MG tablet Take 1,000 mg by mouth 2 (two) times daily with a meal.     nitroGLYCERIN (NITROSTAT) 0.4 MG SL tablet Place 1 tablet (0.4 mg total) under the tongue every 5 (five) minutes as needed for chest pain. 25 tablet 6   ondansetron (ZOFRAN) 8 MG tablet Take 1 tablet (8 mg total) by mouth every 8 (eight) hours as needed for nausea or vomiting. 60 tablet 2   pioglitazone (ACTOS) 15 MG tablet Take 15 mg by mouth daily.     prochlorperazine (COMPAZINE) 10 MG tablet Take 1 tablet (10 mg total) by mouth every 6 (six) hours as needed for nausea or vomiting. 60 tablet 2   clopidogrel (PLAVIX) 75 MG tablet Take 75 mg by mouth daily. (Patient not taking: Reported on 09/01/2022)     glimepiride (AMARYL) 4 MG tablet Take 4 mg by mouth daily with breakfast.     metFORMIN (GLUCOPHAGE) 1000 MG tablet Take 1,000 mg by mouth 2 (two) times daily with a meal.      No current facility-administered medications for this visit.   Facility-Administered Medications Ordered in Other Visits  Medication Dose Route Frequency Provider Last Rate Last Admin   fluorouracil (ADRUCIL) 4,850 mg in sodium chloride 0.9 % 53 mL chemo infusion  2,400 mg/m2 (Treatment Plan Recorded) Intravenous 1 day or 1 dose Lloyd Huger, MD   Infusion Verify at 12/09/22 1329   heparin lock flush 100 unit/mL  500 Units Intravenous Once Lloyd Huger, MD       sodium chloride flush (NS) 0.9 % injection 10 mL  10 mL Intravenous Once Lloyd Huger, MD         OBJECTIVE: Vitals:   12/09/22 0853  BP: 138/75  Pulse: 72  Resp: 18  Temp: (!) 97 F (36.1 C)  SpO2: 100%     Body mass index is 25.77 kg/m.    ECOG FS:0 - Asymptomatic  General: Well-developed, well-nourished, no acute distress. Eyes: Pink conjunctiva, anicteric sclera. HEENT: Normocephalic, moist mucous membranes. Lungs: No audible wheezing or coughing. Heart: Regular rate and rhythm. Abdomen: Soft, nontender, no obvious distention.  Colostomy bag noted. Musculoskeletal: No edema, cyanosis, or clubbing. Neuro: Alert, answering all questions appropriately. Cranial nerves grossly intact. Skin: No rashes or petechiae noted. Psych: Normal affect.  LAB RESULTS:  Lab Results  Component Value Date   NA 139 12/09/2022   K  3.9 12/09/2022   CL 104 12/09/2022   CO2 24 12/09/2022   GLUCOSE 197 (H) 12/09/2022   BUN 18 12/09/2022   CREATININE 0.96 12/09/2022   CALCIUM 8.9 12/09/2022   PROT 6.7 12/09/2022   ALBUMIN 3.6 12/09/2022   AST 31 12/09/2022   ALT 27 12/09/2022   ALKPHOS 112 12/09/2022   BILITOT 0.4 12/09/2022   GFRNONAA >60 12/09/2022   GFRAA >60 12/20/2014    Lab Results  Component Value Date   WBC 10.3 12/09/2022   NEUTROABS 8.3 (H) 12/09/2022   HGB 9.1 (L) 12/09/2022   HCT 29.8 (L) 12/09/2022   MCV 90.3 12/09/2022   PLT 202 12/09/2022     STUDIES: No results found.  ASSESSMENT: Stage IIa rectal adenocarcinoma.  PLAN:    Stage IIa rectal adenocarcinoma: Diagnosis confirmed by imaging and biopsy.  MRI and CT scan results reviewed independently and reported as above confirming stage of disease.  Patient will benefit from neoadjuvant FOLFOX every 2 weeks x 8 cycles followed by concurrent Xeloda and daily XRT.  Finally, patient will benefit from definitive surgical intervention of his residual malignancy.  Unclear if colostomy is reversible.  Patient has now had port placement.  Proceed with cycle 7 of treatment today.  Return to clinic in 2 days for  pump removal and then in 2 weeks for further evaluation and consideration of cycle 8.  Patient was given a referral to radiation oncology today.   Anemia: Chronic and unchanged.  Patient's hemoglobin is 9.1 today.   Neutropenia: Resolved. Continue Udenyca with pump off as needed.   Thrombocytopenia: Resolved.  Proceed with treatment as above. Hyperglycemia: Patient continues to have poor blood glucose control with blood sugars ranging approximately 200.  Continue monitoring and treatment per primary care.  Patient expressed understanding and was in agreement with this plan. He also understands that He can call clinic at any time with any questions, concerns, or complaints.    Cancer Staging  Rectal adenocarcinoma [facility]) Staging form: Colon and Rectum, AJCC 8th Edition - Clinical stage from 08/13/2022: Stage IIA (cT3, cN0, cM0) - Signed by Lloyd Huger, MD on 08/13/2022 Total positive nodes: 0  Lloyd Huger, MD   12/09/2022 2:37 PM

## 2022-12-10 MED ORDER — CAPECITABINE 500 MG PO TABS: 1500.0000 mg | ORAL_TABLET | Freq: Two times a day (BID) | ORAL | 0 refills | Status: DC

## 2022-12-10 NOTE — Telephone Encounter (Signed)
Oral Oncology Pharmacy Student Encounter  Received new prescription for Xeloda (capecitabine) for the treatment of rectal adenocarcinoma in conjunction with daily XRT, planned duration until the end of XRT.  Labs from 12/09/2022 assessed, CMP within normal limits, CBC: Hgb stable at his baseline of 9.1, NEUT# high at 8.3, plts improved from 96 to 202. Prescription dose and frequency appropriate.  Current medication list in Epic reviewed, no DDIs with Xeloda identified.  Evaluated chart and no patient barriers to medication adherence identified.   Prescription has been e-scribed to the [facility] for benefits analysis and approval.  Oral Oncology Clinic will continue to follow for insurance authorization, copayment issues, initial counseling and start date.  Patient agreed to treatment on 12/09/2022 per MD documentation.  Angus Seller, PharmD Candidate [employer] [facility], [place] of 2024 [place]/DB/AP Oral Chemotherapy [facility] [phone]  12/10/2022 11:25 AM

## 2022-12-11 ENCOUNTER — Telehealth: Payer: Self-pay | Admitting: *Deleted

## 2022-12-11 ENCOUNTER — Inpatient Hospital Stay: Payer: Medicare HMO

## 2022-12-11 VITALS — BP 156/77 | HR 63 | Temp 97.5°F | Resp 18

## 2022-12-11 DIAGNOSIS — E1165 Type 2 diabetes mellitus with hyperglycemia: Secondary | ICD-10-CM | POA: Diagnosis not present

## 2022-12-11 DIAGNOSIS — D696 Thrombocytopenia, unspecified: Secondary | ICD-10-CM | POA: Diagnosis not present

## 2022-12-11 DIAGNOSIS — E785 Hyperlipidemia, unspecified: Secondary | ICD-10-CM | POA: Diagnosis not present

## 2022-12-11 DIAGNOSIS — K219 Gastro-esophageal reflux disease without esophagitis: Secondary | ICD-10-CM | POA: Diagnosis not present

## 2022-12-11 DIAGNOSIS — C20 Malignant neoplasm of rectum: Secondary | ICD-10-CM | POA: Diagnosis not present

## 2022-12-11 DIAGNOSIS — D649 Anemia, unspecified: Secondary | ICD-10-CM | POA: Diagnosis not present

## 2022-12-11 DIAGNOSIS — Z5189 Encounter for other specified aftercare: Secondary | ICD-10-CM | POA: Diagnosis not present

## 2022-12-11 DIAGNOSIS — Z5111 Encounter for antineoplastic chemotherapy: Secondary | ICD-10-CM | POA: Diagnosis not present

## 2022-12-11 DIAGNOSIS — I2510 Atherosclerotic heart disease of native coronary artery without angina pectoris: Secondary | ICD-10-CM | POA: Diagnosis not present

## 2022-12-11 MED ORDER — PEGFILGRASTIM-CBQV 6 MG/0.6ML ~~LOC~~ SOSY
6.0000 mg | PREFILLED_SYRINGE | Freq: Once | SUBCUTANEOUS | Status: AC
  Administered 2022-12-11: 6 mg via SUBCUTANEOUS
  Filled 2022-12-11: qty 0.6

## 2022-12-11 MED ORDER — SODIUM CHLORIDE 0.9% FLUSH
10.0000 mL | INTRAVENOUS | Status: DC | PRN
  Administered 2022-12-11: 10 mL
  Filled 2022-12-11: qty 10

## 2022-12-11 MED ORDER — HEPARIN SOD (PORK) LOCK FLUSH 100 UNIT/ML IV SOLN
INTRAVENOUS | Status: AC
  Administered 2022-12-11: 500 [IU]
  Filled 2022-12-11: qty 5

## 2022-12-11 MED ORDER — HEPARIN SOD (PORK) LOCK FLUSH 100 UNIT/ML IV SOLN
500.0000 [IU] | Freq: Once | INTRAVENOUS | Status: AC | PRN
  Filled 2022-12-11: qty 5

## 2022-12-11 NOTE — Telephone Encounter (Signed)
Left vm for triage line at [facility] regarding blood pressure being elevated in clinic today. Patient also reported blurred vision but states this is not new for him. Patients bp 190/68 upon arrival to clinic, bp 179/72 at discharge.

## 2022-12-11 NOTE — Progress Notes (Signed)
1333: B/P 190/68. Pt reports blurred vision, pt states this is not new and associated previous cataracts. MD aware. Pt reports taking B/P medications "this morning" 1336: B/P 179/72. MD aware.  Per Dr. Grayland Ormond okay to proceed with pump d/c, Udencya and discharge home, and clinic RN will reach out to patient's PCP.  8875: B/P 156/77. Pt stable.   1352: Pt stable at discharge , pt denies any concerns at this time.

## 2022-12-16 ENCOUNTER — Encounter: Payer: Self-pay | Admitting: Radiation Oncology

## 2022-12-16 ENCOUNTER — Ambulatory Visit: Payer: Medicare HMO | Admitting: Oncology

## 2022-12-16 ENCOUNTER — Other Ambulatory Visit: Payer: Medicare HMO

## 2022-12-16 ENCOUNTER — Ambulatory Visit: Payer: Medicare HMO

## 2022-12-16 ENCOUNTER — Ambulatory Visit
Admission: RE | Admit: 2022-12-16 | Discharge: 2022-12-16 | Disposition: A | Discharge status: Home / Self Care | Payer: Medicare HMO | Source: Ambulatory Visit | Attending: Radiation Oncology | Admitting: Radiation Oncology

## 2022-12-16 VITALS — BP 167/82 | HR 87 | Temp 98.0°F | Resp 20 | Ht 72.0 in | Wt 187.0 lb

## 2022-12-16 DIAGNOSIS — E119 Type 2 diabetes mellitus without complications: Secondary | ICD-10-CM | POA: Diagnosis not present

## 2022-12-16 DIAGNOSIS — Z7982 Long term (current) use of aspirin: Secondary | ICD-10-CM | POA: Insufficient documentation

## 2022-12-16 DIAGNOSIS — F17210 Nicotine dependence, cigarettes, uncomplicated: Secondary | ICD-10-CM | POA: Diagnosis not present

## 2022-12-16 DIAGNOSIS — Z87442 Personal history of urinary calculi: Secondary | ICD-10-CM | POA: Insufficient documentation

## 2022-12-16 DIAGNOSIS — Z7902 Long term (current) use of antithrombotics/antiplatelets: Secondary | ICD-10-CM | POA: Insufficient documentation

## 2022-12-16 DIAGNOSIS — E785 Hyperlipidemia, unspecified: Secondary | ICD-10-CM | POA: Insufficient documentation

## 2022-12-16 DIAGNOSIS — Z7984 Long term (current) use of oral hypoglycemic drugs: Secondary | ICD-10-CM | POA: Diagnosis not present

## 2022-12-16 DIAGNOSIS — K219 Gastro-esophageal reflux disease without esophagitis: Secondary | ICD-10-CM | POA: Insufficient documentation

## 2022-12-16 DIAGNOSIS — I10 Essential (primary) hypertension: Secondary | ICD-10-CM | POA: Diagnosis not present

## 2022-12-16 DIAGNOSIS — Z79899 Other long term (current) drug therapy: Secondary | ICD-10-CM | POA: Diagnosis not present

## 2022-12-16 DIAGNOSIS — C20 Malignant neoplasm of rectum: Secondary | ICD-10-CM

## 2022-12-16 DIAGNOSIS — I2510 Atherosclerotic heart disease of native coronary artery without angina pectoris: Secondary | ICD-10-CM | POA: Diagnosis not present

## 2022-12-16 NOTE — Consult Note (Signed)
NEW PATIENT EVALUATION  Name: [name] [name]  MRN: [id number]  Date:   12/16/2022     DOB: [birth year]   This 77 y.o. male patient presents to the clinic for initial evaluation of stage IIa (T3a N0 M0) adenocarcinoma of the rectum and patient currently undergoing.  Neoadjuvant FOLFOX chemotherapy  REFERRING PHYSICIAN: [facility]  CHIEF COMPLAINT:  Chief Complaint  Patient presents with   Rectal Cancer    DIAGNOSIS: The encounter diagnosis was Rectal adenocarcinoma ([place]).   PREVIOUS INVESTIGATIONS:  MRI scan CT scans reviewed Clinical notes reviewed Pathology report reviewed  HPI: Patient is a 77 year old male who presented with increasing change in his bowel pattern constipation eventually leading to an ER visit for obstruction.  He was found on lower endoscopy to have a fungating completely obstructing mass in the proximal r rectum.  The mass was circumferential about 10 to 12 cm from the anal verge.  Biopsy was positive for moderately differentiated adenocarcinoma.  MRI confirmed a T3b N0 lesion again 9.6 cm from the anal sphincter.  There was extension of tumor through the muscularis propria.  CT scan of the pelvis also showed short segment narrowed narrowing within the distal sigmoid near the rectosigmoid junction.  Patient is now completed 7 6 of 8 cycles of FOLFOX neoadjuvant chemotherapy which he is tolerated well.  He does have a diverting colostomy which is functioning well.  He is now referred to radiation collagen for consideration of pelvic radiation plus Xeloda.  PLANNED TREATMENT REGIMEN: Whole pelvic radiation plus Xeloda  PAST MEDICAL HISTORY:  has a past medical history of Anemia, Anginal pain ([place]), Bradycardia, Cancer ([place]), Coronary artery disease, Depression, Diabetes mellitus without complication ([place]), GERD (gastroesophageal reflux disease), History of kidney stones, Hyperlipidemia, Hypertension, and Nephrolithiasis.    PAST SURGICAL HISTORY:   Past Surgical History:  Procedure Laterality Date   CARDIAC CATHETERIZATION     CATARACT EXTRACTION     CORONARY ANGIOPLASTY     FLEXIBLE SIGMOIDOSCOPY N/A 07/27/2022   Procedure: FLEXIBLE SIGMOIDOSCOPY;  Surgeon: Lesly Rubenstein, MD;  Location: [facility] ENDOSCOPY;  Service: Endoscopy;  Laterality: N/A;   KIDNEY STONE SURGERY     NECK SURGERY  12/18/2011   nephrolithiasis     PORTACATH PLACEMENT Right 08/24/2022   Procedure: INSERTION PORT-A-CATH;  Surgeon: Ronny Bacon, MD;  Location: [facility] ORS;  Service: General;  Laterality: Right;   stent (other)     TRANSVERSE LOOP COLOSTOMY N/A 07/28/2022   Procedure: TRANSVERSE LOOP COLOSTOMY;  Surgeon: Ronny Bacon, MD;  Location: [facility] ORS;  Service: General;  Laterality: N/A;    FAMILY HISTORY: family history includes Heart disease in his father.  SOCIAL HISTORY:  reports that he has been smoking cigarettes. He has a 18.00 pack-year smoking history. He has never used smokeless tobacco. He reports that he does not drink alcohol and does not use drugs.  ALLERGIES: Patient has no known allergies.  MEDICATIONS:  Current Outpatient Medications  Medication Sig Dispense Refill   aspirin 81 MG EC tablet Take 81 mg by mouth daily.       atorvastatin (LIPITOR) 40 MG tablet Take 1 tablet (40 mg total) by mouth daily. 90 tablet 3   capecitabine (XELODA) 500 MG tablet Take 3 tablets (1,500 mg total) by mouth 2 (two) times daily after a meal. Take Monday-Friday. Take only on days of radiation. 168 tablet 0   cyanocobalamin (VITAMIN B12) 500 MCG tablet Take 500 mcg by mouth daily.     famotidine (PEPCID) 40  MG tablet Take 1 tablet (40 mg total) by mouth daily. 90 tablet 3   glimepiride (AMARYL) 4 MG tablet Take 4 mg by mouth daily with breakfast.     HYDROcodone-acetaminophen (NORCO/VICODIN) 5-325 MG tablet Take 1 tablet by mouth every 6 (six) hours as needed for moderate pain.     ibuprofen (ADVIL) 800 MG tablet Take 1 tablet (800 mg total) by  mouth every 8 (eight) hours as needed. 30 tablet 0   isosorbide mononitrate (IMDUR) 30 MG 24 hr tablet Take 1 tablet (30 mg total) by mouth daily. 90 tablet 3   lidocaine-prilocaine (EMLA) cream Apply to affected area once 30 g 3   metFORMIN (GLUCOPHAGE) 1000 MG tablet Take 1,000 mg by mouth 2 (two) times daily with a meal.     nitroGLYCERIN (NITROSTAT) 0.4 MG SL tablet Place 1 tablet (0.4 mg total) under the tongue every 5 (five) minutes as needed for chest pain. 25 tablet 6   ondansetron (ZOFRAN) 8 MG tablet Take 1 tablet (8 mg total) by mouth every 8 (eight) hours as needed for nausea or vomiting. 60 tablet 2   pioglitazone (ACTOS) 15 MG tablet Take 15 mg by mouth daily.     prochlorperazine (COMPAZINE) 10 MG tablet Take 1 tablet (10 mg total) by mouth every 6 (six) hours as needed for nausea or vomiting. 60 tablet 2   clopidogrel (PLAVIX) 75 MG tablet Take 75 mg by mouth daily. (Patient not taking: Reported on 09/01/2022)     No current facility-administered medications for this encounter.    ECOG PERFORMANCE STATUS:  0 - Asymptomatic  REVIEW OF SYSTEMS: Patient denies any weight loss, fatigue, weakness, fever, chills or night sweats. Patient denies any loss of vision, blurred vision. Patient denies any ringing  of the ears or hearing loss. No irregular heartbeat. Patient denies heart murmur or history of fainting. Patient denies any chest pain or pain radiating to her upper extremities. Patient denies any shortness of breath, difficulty breathing at night, cough or hemoptysis. Patient denies any swelling in the lower legs. Patient denies any nausea vomiting, vomiting of blood, or coffee ground material in the vomitus. Patient denies any stomach pain. Patient states has had normal bowel movements no significant constipation or diarrhea. Patient denies any dysuria, hematuria or significant nocturia. Patient denies any problems walking, swelling in the joints or loss of balance. Patient denies any  skin changes, loss of hair or loss of weight. Patient denies any excessive worrying or anxiety or significant depression. Patient denies any problems with insomnia. Patient denies excessive thirst, polyuria, polydipsia. Patient denies any swollen glands, patient denies easy bruising or easy bleeding. Patient denies any recent infections, allergies or URI. Patient "s visual fields have not changed significantly in recent time.   PHYSICAL EXAM: BP (!) 167/82 (BP Location: Right Arm, Patient Position: Sitting, Cuff Size: Normal)   Pulse 87   Temp 98 F (36.7 C) (Tympanic)   Resp 20   Ht 6' (1.829 m) Comment: stated HT  Wt 187 lb (84.8 kg)   BMI 25.36 kg/m  Patient has a functioning colostomy.  Well-developed well-nourished patient in NAD. HEENT reveals PERLA, EOMI, discs not visualized.  Oral cavity is clear. No oral mucosal lesions are identified. Neck is clear without evidence of cervical or supraclavicular adenopathy. Lungs are clear to A&P. Cardiac examination is essentially unremarkable with regular rate and rhythm without murmur rub or thrill. Abdomen is benign with no organomegaly or masses noted. Motor sensory and DTR levels  are equal and symmetric in the upper and lower extremities. Cranial nerves II through XII are grossly intact. Proprioception is intact. No peripheral adenopathy or edema is identified. No motor or sensory levels are noted. Crude visual fields are within normal range.  LABORATORY DATA: Pathology reports reviewed    RADIOLOGY RESULTS: MRI and CT scans reviewed compatible with above-stated findings   IMPRESSION: Stage IIa adenocarcinoma of the rectum in 77 year old male completing neoadjuvant FOLFOX chemotherapy  PLAN: At this time I recommended course of neoadjuvant radiation with concurrent Xeloda.  Would treat his whole pelvis to 45 Gray boosting the area of tumor involving another 540 centigrade.  Risks and benefits of treatment including possible increased lower  urinary tract symptoms possible diarrhea fatigue alteration of blood counts skin reaction all were discussed in detail with the patient.  He seems to comprehend my treatment plan well.  I have personally set up and ordered his simulation about 2 to 3 weeks to allow him to complete his last cycle of chemotherapy to have some short rest..  Patient comprehends my recommendations well.  I would like to take this opportunity to thank you for allowing me to participate in the care of your patient.Noreene Filbert, MD

## 2022-12-17 ENCOUNTER — Other Ambulatory Visit (HOSPITAL_COMMUNITY): Payer: Self-pay

## 2022-12-22 MED FILL — Dexamethasone Sodium Phosphate Inj 100 MG/10ML: INTRAMUSCULAR | Qty: 1 | Status: AC

## 2022-12-23 ENCOUNTER — Inpatient Hospital Stay: Payer: Medicare HMO

## 2022-12-23 ENCOUNTER — Inpatient Hospital Stay: Payer: Medicare HMO | Attending: Oncology

## 2022-12-23 ENCOUNTER — Inpatient Hospital Stay (HOSPITAL_BASED_OUTPATIENT_CLINIC_OR_DEPARTMENT_OTHER): Payer: Medicare HMO | Admitting: Oncology

## 2022-12-23 VITALS — BP 165/87 | HR 72 | Temp 96.5°F | Resp 18 | Wt 187.4 lb

## 2022-12-23 DIAGNOSIS — Z7984 Long term (current) use of oral hypoglycemic drugs: Secondary | ICD-10-CM | POA: Diagnosis not present

## 2022-12-23 DIAGNOSIS — Z5111 Encounter for antineoplastic chemotherapy: Secondary | ICD-10-CM | POA: Insufficient documentation

## 2022-12-23 DIAGNOSIS — I10 Essential (primary) hypertension: Secondary | ICD-10-CM | POA: Insufficient documentation

## 2022-12-23 DIAGNOSIS — C20 Malignant neoplasm of rectum: Secondary | ICD-10-CM | POA: Diagnosis not present

## 2022-12-23 DIAGNOSIS — Z7982 Long term (current) use of aspirin: Secondary | ICD-10-CM | POA: Insufficient documentation

## 2022-12-23 DIAGNOSIS — E119 Type 2 diabetes mellitus without complications: Secondary | ICD-10-CM | POA: Insufficient documentation

## 2022-12-23 DIAGNOSIS — F17210 Nicotine dependence, cigarettes, uncomplicated: Secondary | ICD-10-CM | POA: Insufficient documentation

## 2022-12-23 DIAGNOSIS — Z7902 Long term (current) use of antithrombotics/antiplatelets: Secondary | ICD-10-CM | POA: Insufficient documentation

## 2022-12-23 DIAGNOSIS — Z79899 Other long term (current) drug therapy: Secondary | ICD-10-CM | POA: Insufficient documentation

## 2022-12-23 DIAGNOSIS — D649 Anemia, unspecified: Secondary | ICD-10-CM | POA: Insufficient documentation

## 2022-12-23 DIAGNOSIS — E1165 Type 2 diabetes mellitus with hyperglycemia: Secondary | ICD-10-CM | POA: Insufficient documentation

## 2022-12-23 DIAGNOSIS — I2510 Atherosclerotic heart disease of native coronary artery without angina pectoris: Secondary | ICD-10-CM | POA: Diagnosis not present

## 2022-12-23 DIAGNOSIS — E785 Hyperlipidemia, unspecified: Secondary | ICD-10-CM | POA: Diagnosis not present

## 2022-12-23 DIAGNOSIS — D696 Thrombocytopenia, unspecified: Secondary | ICD-10-CM | POA: Diagnosis not present

## 2022-12-23 DIAGNOSIS — K219 Gastro-esophageal reflux disease without esophagitis: Secondary | ICD-10-CM | POA: Diagnosis not present

## 2022-12-23 DIAGNOSIS — E876 Hypokalemia: Secondary | ICD-10-CM | POA: Diagnosis not present

## 2022-12-23 LAB — CBC WITH DIFFERENTIAL/PLATELET
Abs Immature Granulocytes: 0.08 10*3/uL — ABNORMAL HIGH (ref 0.00–0.07)
Basophils Absolute: 0 10*3/uL (ref 0.0–0.1)
Basophils Relative: 0 %
Eosinophils Absolute: 0.2 10*3/uL (ref 0.0–0.5)
Eosinophils Relative: 2 %
HCT: 29.5 % — ABNORMAL LOW (ref 39.0–52.0)
Hemoglobin: 9.1 g/dL — ABNORMAL LOW (ref 13.0–17.0)
Immature Granulocytes: 1 %
Lymphocytes Relative: 12 %
Lymphs Abs: 1.1 10*3/uL (ref 0.7–4.0)
MCH: 27.7 pg (ref 26.0–34.0)
MCHC: 30.8 g/dL (ref 30.0–36.0)
MCV: 89.7 fL (ref 80.0–100.0)
Monocytes Absolute: 0.7 10*3/uL (ref 0.1–1.0)
Monocytes Relative: 7 %
Neutro Abs: 7.5 10*3/uL (ref 1.7–7.7)
Neutrophils Relative %: 78 %
Platelets: 63 10*3/uL — ABNORMAL LOW (ref 150–400)
RBC: 3.29 MIL/uL — ABNORMAL LOW (ref 4.22–5.81)
RDW: 23.8 % — ABNORMAL HIGH (ref 11.5–15.5)
WBC: 9.6 10*3/uL (ref 4.0–10.5)
nRBC: 0 % (ref 0.0–0.2)

## 2022-12-23 LAB — COMPREHENSIVE METABOLIC PANEL
ALT: 25 U/L (ref 0–44)
AST: 31 U/L (ref 15–41)
Albumin: 3.7 g/dL (ref 3.5–5.0)
Alkaline Phosphatase: 137 U/L — ABNORMAL HIGH (ref 38–126)
Anion gap: 10 (ref 5–15)
BUN: 15 mg/dL (ref 8–23)
CO2: 24 mmol/L (ref 22–32)
Calcium: 8.9 mg/dL (ref 8.9–10.3)
Chloride: 106 mmol/L (ref 98–111)
Creatinine, Ser: 0.91 mg/dL (ref 0.61–1.24)
GFR, Estimated: >60 mL/min (ref >60)
Glucose, Bld: 178 mg/dL — ABNORMAL HIGH (ref 70–99)
Potassium: 3.2 mmol/L — ABNORMAL LOW (ref 3.5–5.1)
Sodium: 140 mmol/L (ref 135–145)
Total Bilirubin: 0.4 mg/dL (ref 0.3–1.2)
Total Protein: 6.6 g/dL (ref 6.5–8.1)

## 2022-12-23 MED ORDER — POTASSIUM CHLORIDE 20 MEQ/100ML IV SOLN
20.0000 meq | Freq: Once | INTRAVENOUS | Status: AC
  Administered 2022-12-23: 20 meq via INTRAVENOUS

## 2022-12-23 MED ORDER — SODIUM CHLORIDE 0.9 % IV SOLN: 40.0000 meq | Freq: Once | INTRAVENOUS | Status: DC

## 2022-12-23 MED ORDER — SODIUM CHLORIDE 0.9 % IV SOLN
Freq: Once | INTRAVENOUS | Status: AC
  Filled 2022-12-23: qty 250

## 2022-12-23 MED ORDER — HEPARIN SOD (PORK) LOCK FLUSH 100 UNIT/ML IV SOLN
INTRAVENOUS | Status: AC
  Administered 2022-12-23: 500 [IU]
  Filled 2022-12-23: qty 5

## 2022-12-23 NOTE — Progress Notes (Signed)
[place]  [phone] [phone]) [phone]  ID: [name] [name] OB: 1946/09/06  MR#: [id number]  DGU#:[id number]  Patient Care Team: [facility] as PCP - General [name], [name] November, MD as PCP - Cardiology (Cardiology) [name], MD as Consulting Physician (Cardiology) [name], RN as Oncology Nurse Navigator Grayland Ormond, [name] November, MD as Consulting Physician (Oncology)  CHIEF COMPLAINT: Stage IIa rectal adenocarcinoma.  INTERVAL HISTORY: Patient returns to clinic today for further evaluation and consideration of cycle 8 of 8 of neoadjuvant FOLFOX.  He continues to feel well and remains asymptomatic.  He is tolerating his treatments without significant side effects.  He has no neurologic complaints.  He denies any recent fevers or illnesses.  He has no chest pain, shortness of breath, cough, or hemoptysis.  He denies any nausea, vomiting, constipation, or diarrhea.  He does not report any melena or hematochezia.  He has no urinary complaints.  Patient offers no specific complaints today.  REVIEW OF SYSTEMS:   Review of Systems  Constitutional: Negative.  Negative for fever, malaise/fatigue and weight loss.  Respiratory: Negative.  Negative for cough, hemoptysis and shortness of breath.   Cardiovascular: Negative.  Negative for chest pain and leg swelling.  Gastrointestinal: Negative.  Negative for abdominal pain.  Genitourinary: Negative.  Negative for dysuria.  Musculoskeletal: Negative.  Negative for back pain.  Skin: Negative.  Negative for rash.  Neurological: Negative.  Negative for dizziness, focal weakness, weakness and headaches.  Psychiatric/Behavioral: Negative.  The patient is not nervous/anxious.     As per HPI. Otherwise, a complete review of systems is negative.  PAST MEDICAL HISTORY: Past Medical History:  Diagnosis Date   Anemia    Anginal pain (HCC)    Bradycardia    Cancer (HCC)    Coronary  artery disease    x 1 stent   Depression    Diabetes mellitus without complication (HCC)    GERD (gastroesophageal reflux disease)    History of kidney stones    Hyperlipidemia    Hypertension    Nephrolithiasis     PAST SURGICAL HISTORY: Past Surgical History:  Procedure Laterality Date   CARDIAC CATHETERIZATION     CATARACT EXTRACTION     CORONARY ANGIOPLASTY     FLEXIBLE SIGMOIDOSCOPY N/A 07/27/2022   Procedure: FLEXIBLE SIGMOIDOSCOPY;  Surgeon: Lesly Rubenstein, MD;  Location: [facility];  Service: Endoscopy;  Laterality: N/A;   KIDNEY STONE SURGERY     NECK SURGERY  12/18/2011   nephrolithiasis     PORTACATH PLACEMENT Right 08/24/2022   Procedure: INSERTION PORT-A-CATH;  Surgeon: Ronny Bacon, MD;  Location: [facility] ORS;  Service: General;  Laterality: Right;   stent (other)     TRANSVERSE LOOP COLOSTOMY N/A 07/28/2022   Procedure: TRANSVERSE LOOP COLOSTOMY;  Surgeon: Ronny Bacon, MD;  Location: [facility] ORS;  Service: General;  Laterality: N/A;    FAMILY HISTORY: Family History  Problem Relation Age of Onset   Heart disease Father     ADVANCED DIRECTIVES (Y/N):  N  HEALTH MAINTENANCE: Social History   Tobacco Use   Smoking status: Every Day    Packs/day: 0.50    Years: 36.00    Total pack years: 18.00    Types: Cigarettes   Smokeless tobacco: Never  Vaping Use   Vaping Use: Never used  Substance Use Topics   Alcohol use: No   Drug use: No     Colonoscopy:  PAP:  Bone density:  Lipid panel:  No Known Allergies  Current Outpatient Medications  Medication Sig Dispense Refill   aspirin 81 MG EC tablet Take 81 mg by mouth daily.       atorvastatin (LIPITOR) 40 MG tablet Take 1 tablet (40 mg total) by mouth daily. 90 tablet 3   capecitabine (XELODA) 500 MG tablet Take 3 tablets (1,500 mg total) by mouth 2 (two) times daily after a meal. Take Monday-Friday. Take only on days of radiation. 168 tablet 0   clopidogrel (PLAVIX) 75 MG tablet Take 75  mg by mouth daily. (Patient not taking: Reported on 09/01/2022)     cyanocobalamin (VITAMIN B12) 500 MCG tablet Take 500 mcg by mouth daily.     famotidine (PEPCID) 40 MG tablet Take 1 tablet (40 mg total) by mouth daily. 90 tablet 3   glimepiride (AMARYL) 4 MG tablet Take 4 mg by mouth daily with breakfast.     HYDROcodone-acetaminophen (NORCO/VICODIN) 5-325 MG tablet Take 1 tablet by mouth every 6 (six) hours as needed for moderate pain.     ibuprofen (ADVIL) 800 MG tablet Take 1 tablet (800 mg total) by mouth every 8 (eight) hours as needed. 30 tablet 0   isosorbide mononitrate (IMDUR) 30 MG 24 hr tablet Take 1 tablet (30 mg total) by mouth daily. 90 tablet 3   lidocaine-prilocaine (EMLA) cream Apply to affected area once 30 g 3   metFORMIN (GLUCOPHAGE) 1000 MG tablet Take 1,000 mg by mouth 2 (two) times daily with a meal.     nitroGLYCERIN (NITROSTAT) 0.4 MG SL tablet Place 1 tablet (0.4 mg total) under the tongue every 5 (five) minutes as needed for chest pain. 25 tablet 6   ondansetron (ZOFRAN) 8 MG tablet Take 1 tablet (8 mg total) by mouth every 8 (eight) hours as needed for nausea or vomiting. 60 tablet 2   pioglitazone (ACTOS) 15 MG tablet Take 15 mg by mouth daily.     prochlorperazine (COMPAZINE) 10 MG tablet Take 1 tablet (10 mg total) by mouth every 6 (six) hours as needed for nausea or vomiting. 60 tablet 2   No current facility-administered medications for this visit.   Facility-Administered Medications Ordered in Other Visits  Medication Dose Route Frequency Provider Last Rate Last Admin   heparin lock flush 100 UNIT/ML injection            potassium chloride 20 mEq in 100 mL IVPB  20 mEq Intravenous Once Lloyd Huger, MD       potassium chloride 20 mEq in 100 mL IVPB  20 mEq Intravenous Once Lloyd Huger, MD 100 mL/hr at 12/23/22 0935 20 mEq at 12/23/22 0935    OBJECTIVE: There were no vitals filed for this visit.    There is no height or weight on file to  calculate BMI.    ECOG FS:0 - Asymptomatic  General: Well-developed, well-nourished, no acute distress. Eyes: Pink conjunctiva, anicteric sclera. HEENT: Normocephalic, moist mucous membranes. Lungs: No audible wheezing or coughing. Heart: Regular rate and rhythm. Abdomen: Soft, nontender, no obvious distention.  Colostomy bag noted. Musculoskeletal: No edema, cyanosis, or clubbing. Neuro: Alert, answering all questions appropriately. Cranial nerves grossly intact. Skin: No rashes or petechiae noted. Psych: Normal affect.  LAB RESULTS:  Lab Results  Component Value Date   NA 140 12/23/2022   K 3.2 (L) 12/23/2022   CL 106 12/23/2022   CO2 24 12/23/2022   GLUCOSE 178 (H) 12/23/2022   BUN 15 12/23/2022   CREATININE 0.91 12/23/2022  CALCIUM 8.9 12/23/2022   PROT 6.6 12/23/2022   ALBUMIN 3.7 12/23/2022   AST 31 12/23/2022   ALT 25 12/23/2022   ALKPHOS 137 (H) 12/23/2022   BILITOT 0.4 12/23/2022   GFRNONAA >60 12/23/2022   GFRAA >60 12/20/2014    Lab Results  Component Value Date   WBC 9.6 12/23/2022   NEUTROABS 7.5 12/23/2022   HGB 9.1 (L) 12/23/2022   HCT 29.5 (L) 12/23/2022   MCV 89.7 12/23/2022   PLT 63 (L) 12/23/2022     STUDIES: No results found.  ASSESSMENT: Stage IIa rectal adenocarcinoma.  PLAN:    Stage IIa rectal adenocarcinoma: Diagnosis confirmed by imaging and biopsy.  MRI and CT scan results reviewed independently and reported as above confirming stage of disease.  Patient will benefit from neoadjuvant FOLFOX every 2 weeks x 8 cycles followed by concurrent Xeloda and daily XRT.  Finally, patient will benefit from definitive surgical intervention of his residual malignancy.  Unclear if colostomy is reversible.  Patient has now had port placement.  Delay cycle 8 of treatment today secondary to thrombocytopenia.  Patient simulation is scheduled for December 31, 2022 with plans to start XRT at the end of the month.  Return to clinic in 1 week for further  evaluation and reconsideration of cycle 8.    Anemia: Chronic and unchanged.  Patient's hemoglobin is 9.1 today.   Neutropenia: Resolved. Continue Udenyca with pump off as needed.   Thrombocytopenia: Platelets are 63 today.  Delay treatment as above.  Hyperglycemia: Patient's blood glucose is mildly improved to 178.  Continue monitoring and treatment per primary care. Hypokalemia: Patient will receive 40 mEq IV potassium today.  Patient expressed understanding and was in agreement with this plan. He also understands that He can call clinic at any time with any questions, concerns, or complaints.    Cancer Staging  Rectal adenocarcinoma [facility]) Staging form: Colon and Rectum, AJCC 8th Edition - Clinical stage from 08/13/2022: Stage IIA (cT3, cN0, cM0) - Signed by Lloyd Huger, MD on 08/13/2022 Total positive nodes: 0  Lloyd Huger, MD   12/23/2022 10:05 AM

## 2022-12-23 NOTE — Progress Notes (Signed)
Patient here today for follow up and treatment consideration regarding rectal cancer. Patient reports he has some nausea with treatments but improves with nausea medications. Patient also reports he has noticed worsening neuropathy in hands, tends to be worse right after treatment and improves.

## 2022-12-23 NOTE — Patient Instructions (Addendum)
Potassium Chloride Injection What is this medication? POTASSIUM CHLORIDE (poe TASS i um KLOOR ide) prevents and treats low levels of potassium in your body. Potassium plays an important role in maintaining the health of your kidneys, heart, muscles, and nervous system. This medicine may be used for other purposes; ask your health care provider or pharmacist if you have questions. COMMON BRAND NAME(S): PROAMP What should I tell my care team before I take this medication? They need to know if you have any of these conditions: Addison disease Dehydration Diabetes (high blood sugar) Heart disease High levels of potassium in the blood Irregular heartbeat or rhythm Kidney disease Large areas of burned skin An unusual or allergic reaction to potassium, other medications, foods, dyes, or preservatives Pregnant or trying to get pregnant Breast-feeding How should I use this medication? This medication is injected into a vein. It is given in a hospital or clinic setting. Talk to your care team about the use of this medication in children. Special care may be needed. Overdosage: If you think you have taken too much of this medicine contact a poison control center or emergency room at once. NOTE: This medicine is only for you. Do not share this medicine with others. What if I miss a dose? This does not apply. This medication is not for regular use. What may interact with this medication? Do not take this medication with any of the following: Certain diuretics, such as spironolactone, triamterene Eplerenone Sodium polystyrene sulfonate This medication may also interact with the following: Certain medications for blood pressure or heart disease, such as lisinopril, losartan, quinapril, valsartan Medications that lower your chance of fighting infection, such as cyclosporine, tacrolimus NSAIDs, medications for pain and inflammation, such as ibuprofen or naproxen Other potassium supplements Salt  substitutes This list may not describe all possible interactions. Give your health care provider a list of all the medicines, herbs, non-prescription drugs, or dietary supplements you use. Also tell them if you smoke, drink alcohol, or use illegal drugs. Some items may interact with your medicine. What should I watch for while using this medication? Visit your care team for regular checks on your progress. Tell your care team if your symptoms do not start to get better or if they get worse. You may need blood work while you are taking this medication. Avoid salt substitutes unless you are told otherwise by your care team. What side effects may I notice from receiving this medication? Side effects that you should report to your care team as soon as possible: Allergic reactions--skin rash, itching, hives, swelling of the face, lips, tongue, or throat High potassium level--muscle weakness, fast or irregular heartbeat Side effects that usually do not require medical attention (report to your care team if they continue or are bothersome): Diarrhea Nausea Stomach pain Vomiting This list may not describe all possible side effects. Call your doctor for medical advice about side effects. You may report side effects to FDA at 1-800-FDA-1088. Where should I keep my medication? This medication is given in a hospital or clinic. It will not be stored at home. NOTE: This sheet is a summary. It may not cover all possible information. If you have questions about this medicine, talk to your doctor, pharmacist, or health care provider.  2023 Elsevier/Gold Standard (2021-02-13 00:00:00)  Hypokalemia Hypokalemia means that the amount of potassium in the blood is lower than normal. Potassium is a mineral (electrolyte) that helps regulate the amount of fluid in the body. It also stimulates muscle  tightening (contraction) and helps nerves work properly. Normally, most of the body's potassium is inside cells, and  only a very small amount is in the blood. Because the amount in the blood is so small, minor changes to potassium levels in the blood can be life-threatening. What are the causes? This condition may be caused by: Antibiotic medicine. Diarrhea or vomiting. Taking too much of a medicine that helps you have a bowel movement (laxative) can cause diarrhea and lead to hypokalemia. Chronic kidney disease (CKD). Medicines that help the body get rid of excess fluid (diuretics). Eating disorders, such as anorexia or bulimia. Low magnesium levels in the body. Sweating a lot. What are the signs or symptoms? Symptoms of this condition include: Weakness. Constipation. Fatigue. Muscle cramps. Mental confusion. Skipped heartbeats or irregular heartbeat (palpitations). Tingling or numbness. How is this diagnosed? This condition is diagnosed with a blood test. How is this treated? This condition may be treated by: Taking potassium supplements. Adjusting the medicines that you take. Eating more foods that contain a lot of potassium. If your potassium level is very low, you may need to get potassium through an IV and be monitored in the hospital. Follow these instructions at home: Eating and drinking  Eat a healthy diet. A healthy diet includes fresh fruits and vegetables, whole grains, healthy fats, and lean proteins. If told, eat more foods that contain a lot of potassium. These include: Nuts, such as peanuts and pistachios. Seeds, such as sunflower seeds and pumpkin seeds. Peas, lentils, and lima beans. Whole grain and bran cereals and breads. Fresh fruits and vegetables, such as apricots, avocado, bananas, cantaloupe, kiwi, oranges, tomatoes, asparagus, and potatoes. Juices, such as orange, tomato, and prune. Lean meats, including fish. Milk and milk products, such as yogurt. General instructions Take over-the-counter and prescription medicines only as told by your health care provider. This  includes vitamins, natural food products, and supplements. Keep all follow-up visits. This is important. Contact a health care provider if: You have weakness that gets worse. You feel your heart pounding or racing. You vomit. You have diarrhea. You have diabetes and you have trouble keeping your blood sugar in your target range. Get help right away if: You have chest pain. You have shortness of breath. You have vomiting or diarrhea that lasts for more than 2 days. You faint. These symptoms may be an emergency. Get help right away. Call 911. Do not wait to see if the symptoms will go away. Do not drive yourself to the hospital. Summary Hypokalemia means that the amount of potassium in the blood is lower than normal. This condition is diagnosed with a blood test. Hypokalemia may be treated by taking potassium supplements, adjusting the medicines that you take, or eating more foods that are high in potassium. If your potassium level is very low, you may need to get potassium through an IV and be monitored in the hospital. This information is not intended to replace advice given to you by your health care provider. Make sure you discuss any questions you have with your health care provider. Document Revised: 07/17/2021 Document Reviewed: 07/17/2021 Elsevier Patient Education  [place].

## 2022-12-25 ENCOUNTER — Inpatient Hospital Stay: Payer: Medicare HMO

## 2022-12-28 DIAGNOSIS — C20 Malignant neoplasm of rectum: Secondary | ICD-10-CM | POA: Diagnosis not present

## 2022-12-29 MED FILL — Dexamethasone Sodium Phosphate Inj 100 MG/10ML: INTRAMUSCULAR | Qty: 1 | Status: AC

## 2022-12-30 ENCOUNTER — Inpatient Hospital Stay: Payer: Medicare HMO

## 2022-12-30 ENCOUNTER — Encounter: Payer: Self-pay | Admitting: Oncology

## 2022-12-30 ENCOUNTER — Inpatient Hospital Stay: Payer: Medicare HMO | Admitting: Oncology

## 2022-12-30 DIAGNOSIS — Z5111 Encounter for antineoplastic chemotherapy: Secondary | ICD-10-CM | POA: Diagnosis not present

## 2022-12-30 DIAGNOSIS — E876 Hypokalemia: Secondary | ICD-10-CM | POA: Diagnosis not present

## 2022-12-30 DIAGNOSIS — I2510 Atherosclerotic heart disease of native coronary artery without angina pectoris: Secondary | ICD-10-CM | POA: Diagnosis not present

## 2022-12-30 DIAGNOSIS — C20 Malignant neoplasm of rectum: Secondary | ICD-10-CM

## 2022-12-30 DIAGNOSIS — E1165 Type 2 diabetes mellitus with hyperglycemia: Secondary | ICD-10-CM | POA: Diagnosis not present

## 2022-12-30 DIAGNOSIS — D649 Anemia, unspecified: Secondary | ICD-10-CM | POA: Diagnosis not present

## 2022-12-30 DIAGNOSIS — D696 Thrombocytopenia, unspecified: Secondary | ICD-10-CM | POA: Diagnosis not present

## 2022-12-30 DIAGNOSIS — K219 Gastro-esophageal reflux disease without esophagitis: Secondary | ICD-10-CM | POA: Diagnosis not present

## 2022-12-30 DIAGNOSIS — E119 Type 2 diabetes mellitus without complications: Secondary | ICD-10-CM | POA: Diagnosis not present

## 2022-12-30 LAB — CBC WITH DIFFERENTIAL/PLATELET
Abs Immature Granulocytes: 0.05 10*3/uL (ref 0.00–0.07)
Basophils Absolute: 0 10*3/uL (ref 0.0–0.1)
Basophils Relative: 1 %
Eosinophils Absolute: 0.1 10*3/uL (ref 0.0–0.5)
Eosinophils Relative: 1 %
HCT: 30.4 % — ABNORMAL LOW (ref 39.0–52.0)
Hemoglobin: 9.3 g/dL — ABNORMAL LOW (ref 13.0–17.0)
Immature Granulocytes: 1 %
Lymphocytes Relative: 13 %
Lymphs Abs: 1.1 10*3/uL (ref 0.7–4.0)
MCH: 27.5 pg (ref 26.0–34.0)
MCHC: 30.6 g/dL (ref 30.0–36.0)
MCV: 89.9 fL (ref 80.0–100.0)
Monocytes Absolute: 0.6 10*3/uL (ref 0.1–1.0)
Monocytes Relative: 7 %
Neutro Abs: 6.4 10*3/uL (ref 1.7–7.7)
Neutrophils Relative %: 77 %
Platelets: 134 10*3/uL — ABNORMAL LOW (ref 150–400)
RBC: 3.38 MIL/uL — ABNORMAL LOW (ref 4.22–5.81)
RDW: 24.9 % — ABNORMAL HIGH (ref 11.5–15.5)
WBC: 8.2 10*3/uL (ref 4.0–10.5)
nRBC: 0 % (ref 0.0–0.2)

## 2022-12-30 LAB — COMPREHENSIVE METABOLIC PANEL
ALT: 26 U/L (ref 0–44)
AST: 30 U/L (ref 15–41)
Albumin: 3.7 g/dL (ref 3.5–5.0)
Alkaline Phosphatase: 113 U/L (ref 38–126)
Anion gap: 12 (ref 5–15)
BUN: 18 mg/dL (ref 8–23)
CO2: 23 mmol/L (ref 22–32)
Calcium: 8.8 mg/dL — ABNORMAL LOW (ref 8.9–10.3)
Chloride: 101 mmol/L (ref 98–111)
Creatinine, Ser: 0.82 mg/dL (ref 0.61–1.24)
GFR, Estimated: >60 mL/min (ref >60)
Glucose, Bld: 249 mg/dL — ABNORMAL HIGH (ref 70–99)
Potassium: 3.5 mmol/L (ref 3.5–5.1)
Sodium: 136 mmol/L (ref 135–145)
Total Bilirubin: 0.5 mg/dL (ref 0.3–1.2)
Total Protein: 6.6 g/dL (ref 6.5–8.1)

## 2022-12-30 MED ORDER — SODIUM CHLORIDE 0.9 % IV SOLN
2400.0000 mg/m2 | INTRAVENOUS | Status: DC
  Administered 2022-12-30: 5000 mg via INTRAVENOUS
  Filled 2022-12-30: qty 100

## 2022-12-30 MED ORDER — SODIUM CHLORIDE 0.9 % IV SOLN
10.0000 mg | Freq: Once | INTRAVENOUS | Status: AC
  Administered 2022-12-30: 10 mg via INTRAVENOUS
  Filled 2022-12-30: qty 1
  Filled 2022-12-30: qty 10

## 2022-12-30 MED ORDER — DEXTROSE 5 % IV SOLN
Freq: Once | INTRAVENOUS | Status: AC
  Filled 2022-12-30: qty 250

## 2022-12-30 MED ORDER — OXALIPLATIN CHEMO INJECTION 100 MG/20ML
85.0000 mg/m2 | Freq: Once | INTRAVENOUS | Status: AC
  Administered 2022-12-30: 175 mg via INTRAVENOUS
  Filled 2022-12-30: qty 35

## 2022-12-30 MED ORDER — PALONOSETRON HCL INJECTION 0.25 MG/5ML
0.2500 mg | Freq: Once | INTRAVENOUS | Status: AC
  Administered 2022-12-30: 0.25 mg via INTRAVENOUS
  Filled 2022-12-30: qty 5

## 2022-12-30 MED ORDER — FLUOROURACIL CHEMO INJECTION 2.5 GM/50ML
400.0000 mg/m2 | Freq: Once | INTRAVENOUS | Status: AC
  Administered 2022-12-30: 800 mg via INTRAVENOUS
  Filled 2022-12-30: qty 16

## 2022-12-30 MED ORDER — LEUCOVORIN CALCIUM INJECTION 350 MG
400.0000 mg/m2 | Freq: Once | INTRAVENOUS | Status: AC
  Administered 2022-12-30: 812 mg via INTRAVENOUS
  Filled 2022-12-30: qty 40.6

## 2022-12-30 NOTE — Progress Notes (Signed)
[name]  [phone]) [phone] [phone]) [phone]  ID: [name] [name] OB: 1946-01-05  MR#: [id number]  [id number]  Patient Care Team: [facility] as PCP - General [name], [name] November, MD as PCP - Cardiology (Cardiology) [name], MD as Consulting Physician (Cardiology) [name], RN as Oncology Nurse Navigator Grayland Ormond, [name] November, MD as Consulting Physician (Oncology)  CHIEF COMPLAINT: Stage IIa rectal adenocarcinoma.  INTERVAL HISTORY: Patient returns to clinic today for further evaluation and reconsideration of cycle 8 of 8 of neoadjuvant FOLFOX.  He continues to feel well and remains asymptomatic.  He is tolerating his treatments without significant side effects. He has no neurologic complaints.  He denies any recent fevers or illnesses.  He has no chest pain, shortness of breath, cough, or hemoptysis.  He denies any nausea, vomiting, constipation, or diarrhea.  He does not report any melena or hematochezia.  He has no urinary complaints.  Patient offers no specific complaints today.  REVIEW OF SYSTEMS:   Review of Systems  Constitutional: Negative.  Negative for fever, malaise/fatigue and weight loss.  Respiratory: Negative.  Negative for cough, hemoptysis and shortness of breath.   Cardiovascular: Negative.  Negative for chest pain and leg swelling.  Gastrointestinal: Negative.  Negative for abdominal pain.  Genitourinary: Negative.  Negative for dysuria.  Musculoskeletal: Negative.  Negative for back pain.  Skin: Negative.  Negative for rash.  Neurological: Negative.  Negative for dizziness, focal weakness, weakness and headaches.  Psychiatric/Behavioral: Negative.  The patient is not nervous/anxious.     As per HPI. Otherwise, a complete review of systems is negative.  PAST MEDICAL HISTORY: Past Medical History:  Diagnosis Date   Anemia    Anginal pain (HCC)    Bradycardia    Cancer (HCC)    Coronary  artery disease    x 1 stent   Depression    Diabetes mellitus without complication (HCC)    GERD (gastroesophageal reflux disease)    History of kidney stones    Hyperlipidemia    Hypertension    Nephrolithiasis     PAST SURGICAL HISTORY: Past Surgical History:  Procedure Laterality Date   CARDIAC CATHETERIZATION     CATARACT EXTRACTION     CORONARY ANGIOPLASTY     FLEXIBLE SIGMOIDOSCOPY N/A 07/27/2022   Procedure: FLEXIBLE SIGMOIDOSCOPY;  Surgeon: Lesly Rubenstein, MD;  Location: [facility];  Service: Endoscopy;  Laterality: N/A;   KIDNEY STONE SURGERY     NECK SURGERY  12/18/2011   nephrolithiasis     PORTACATH PLACEMENT Right 08/24/2022   Procedure: INSERTION PORT-A-CATH;  Surgeon: Ronny Bacon, MD;  Location: [facility] OR[facility];  Service: General;  Laterality: Right;   stent (other)     TRANSVERSE LOOP COLOSTOMY N/A 07/28/2022   Procedure: TRANSVERSE LOOP COLOSTOMY;  Surgeon: Ronny Bacon, MD;  Location: [facility] OR[facility];  Service: General;  Laterality: N/A;    FAMILY HISTORY: Family History  Problem Relation Age of Onset   Heart disease Father     ADVANCED DIRECTIVES (Y/N):  N  HEALTH MAINTENANCE: Social History   Tobacco Use   Smoking status: Every Day    Packs/day: 0.50    Years: 36.00    Total pack years: 18.00    Types: Cigarettes   Smokeless tobacco: Never  Vaping Use   Vaping Use: Never used  Substance Use Topics   Alcohol use: No   Drug use: No     Colonoscopy:  PAP:  Bone density:  Lipid panel:  No  Known Allergies  Current Outpatient Medications  Medication Sig Dispense Refill   aspirin 81 MG EC tablet Take 81 mg by mouth daily.       atorvastatin (LIPITOR) 40 MG tablet Take 1 tablet (40 mg total) by mouth daily. 90 tablet 3   capecitabine (XELODA) 500 MG tablet Take 3 tablets (1,500 mg total) by mouth 2 (two) times daily after a meal. Take Monday-Friday. Take only on days of radiation. 168 tablet 0   cyanocobalamin (VITAMIN B12) 500 MCG  tablet Take 500 mcg by mouth daily.     famotidine (PEPCID) 40 MG tablet Take 1 tablet (40 mg total) by mouth daily. 90 tablet 3   glimepiride (AMARYL) 4 MG tablet Take 4 mg by mouth daily with breakfast.     HYDROcodone-acetaminophen (NORCO/VICODIN) 5-325 MG tablet Take 1 tablet by mouth every 6 (six) hours as needed for moderate pain.     ibuprofen (ADVIL) 800 MG tablet Take 1 tablet (800 mg total) by mouth every 8 (eight) hours as needed. 30 tablet 0   isosorbide mononitrate (IMDUR) 30 MG 24 hr tablet Take 1 tablet (30 mg total) by mouth daily. 90 tablet 3   lidocaine-prilocaine (EMLA) cream Apply to affected area once 30 g 3   metFORMIN (GLUCOPHAGE) 1000 MG tablet Take 1,000 mg by mouth 2 (two) times daily with a meal.     nitroGLYCERIN (NITROSTAT) 0.4 MG SL tablet Place 1 tablet (0.4 mg total) under the tongue every 5 (five) minutes as needed for chest pain. 25 tablet 6   ondansetron (ZOFRAN) 8 MG tablet Take 1 tablet (8 mg total) by mouth every 8 (eight) hours as needed for nausea or vomiting. 60 tablet 2   pioglitazone (ACTOS) 15 MG tablet Take 15 mg by mouth daily.     prochlorperazine (COMPAZINE) 10 MG tablet Take 1 tablet (10 mg total) by mouth every 6 (six) hours as needed for nausea or vomiting. 60 tablet 2   clopidogrel (PLAVIX) 75 MG tablet Take 75 mg by mouth daily. (Patient not taking: Reported on 09/01/2022)     No current facility-administered medications for this visit.   Facility-Administered Medications Ordered in Other Visits  Medication Dose Route Frequency Provider Last Rate Last Admin   fluorouracil (ADRUCIL) 5,000 mg in sodium chloride 0.9 % 150 mL chemo infusion  2,400 mg/m2 (Treatment Plan Recorded) Intravenous 1 day or 1 dose Grayland Ormond, [name] November, MD       fluorouracil (ADRUCIL) chemo injection 800 mg  400 mg/m2 (Treatment Plan Recorded) Intravenous Once Lloyd Huger, MD       leucovorin 812 mg in dextrose 5 % 250 mL infusion  400 mg/m2 (Treatment Plan Recorded)  Intravenous Once Lloyd Huger, MD 145 mL/hr at 12/30/22 1140 812 mg at 12/30/22 1140   oxaliplatin (ELOXATIN) 175 mg in dextrose 5 % 500 mL chemo infusion  85 mg/m2 (Treatment Plan Recorded) Intravenous Once Lloyd Huger, MD 268 mL/hr at 12/30/22 1142 175 mg at 12/30/22 1142    OBJECTIVE: Vitals:   12/30/22 0949  BP: (!) 163/81  Pulse: 71  Resp: 18  Temp: (!) 97.1 F (36.2 C)  SpO2: 99%      Body mass index is 26.09 kg/m.    ECOG FS:0 - Asymptomatic  General: Well-developed, well-nourished, no acute distress. Eyes: Pink conjunctiva, anicteric sclera. HEENT: Normocephalic, moist mucous membranes. Lungs: No audible wheezing or coughing. Heart: Regular rate and rhythm. Abdomen: Soft, nontender, no obvious distention.  Colostomy bag noted. Musculoskeletal:  No edema, cyanosis, or clubbing. Neuro: Alert, answering all questions appropriately. Cranial nerves grossly intact. Skin: No rashes or petechiae noted. Psych: Normal affect.   LAB RESULTS:  Lab Results  Component Value Date   NA 136 12/30/2022   K 3.5 12/30/2022   CL 101 12/30/2022   CO2 23 12/30/2022   GLUCOSE 249 (H) 12/30/2022   BUN 18 12/30/2022   CREATININE 0.82 12/30/2022   CALCIUM 8.8 (L) 12/30/2022   PROT 6.6 12/30/2022   ALBUMIN 3.7 12/30/2022   AST 30 12/30/2022   ALT 26 12/30/2022   ALKPHOS 113 12/30/2022   BILITOT 0.5 12/30/2022   GFRNONAA >60 12/30/2022   GFRAA >60 12/20/2014    Lab Results  Component Value Date   WBC 8.2 12/30/2022   NEUTROABS 6.4 12/30/2022   HGB 9.3 (L) 12/30/2022   HCT 30.4 (L) 12/30/2022   MCV 89.9 12/30/2022   PLT 134 (L) 12/30/2022     STUDIES: No results found.  ASSESSMENT: Stage IIa rectal adenocarcinoma.  PLAN:    Stage IIa rectal adenocarcinoma: Diagnosis confirmed by imaging and biopsy.  MRI and CT scan results reviewed independently and reported as above confirming stage of disease.  Patient will benefit from neoadjuvant FOLFOX every 2 weeks x  8 cycles followed by concurrent Xeloda and daily XRT.  Finally, patient will benefit from definitive surgical intervention of his residual malignancy.  Unclear if colostomy is reversible.  Patient has now had port placement.  Proceed with patient's eighth and final treatment today.  Return to clinic tomorrow for XRT simulation, and 2 days for pump removal and then on January 07, 2023 to initiate XRT as well as further evaluation and initiation of oral Xeloda.  Appreciate clinical pharmacy input.   Anemia: Chronic and unchanged.  Patient's hemoglobin is 9.3 today. Neutropenia: Resolved. Continue Udenyca with pump off as needed.   Thrombocytopenia: Platelets improved to 134.  Proceed with treatment as above. Hyperglycemia: Patient continues to have poor control with blood glucose ranging mostly over 200.  Continue monitoring and treatment per primary care. Hypokalemia: Resolved.  Patient expressed understanding and was in agreement with this plan. He also understands that He can call clinic at any time with any questions, concerns, or complaints.    Cancer Staging  Rectal adenocarcinoma [facility]) Staging form: Colon and Rectum, AJCC 8th Edition - Clinical stage from 08/13/2022: Stage IIA (cT3, cN0, cM0) - Signed by Lloyd Huger, MD on 08/13/2022 Total positive nodes: 0  Lloyd Huger, MD   12/30/2022 12:27 PM

## 2022-12-30 NOTE — Patient Instructions (Signed)
[place]  Discharge Instructions: Thank you for choosing [facility] to provide your oncology and hematology care.  If you have a lab appointment with the [facility], please go directly to the [facility] and check in at the registration area.  Wear comfortable clothing and clothing appropriate for easy access to any Portacath or PICC line.   We strive to give you quality time with your provider. You may need to reschedule your appointment if you arrive late (15 or more minutes).  Arriving late affects you and other patients whose appointments are after yours.  Also, if you miss three or more appointments without notifying the office, you may be dismissed from the clinic at the provider's discretion.      For prescription refill requests, have your pharmacy contact our office and allow 72 hours for refills to be completed.    Today you received the following chemotherapy and/or immunotherapy agents OXALIPLATIN, LEUCOVORIN, 5 FU      To help prevent nausea and vomiting after your treatment, we encourage you to take your nausea medication as directed.  BELOW ARE SYMPTOMS THAT SHOULD BE REPORTED IMMEDIATELY: *FEVER GREATER THAN 100.4 F (38 C) OR HIGHER *CHILLS OR SWEATING *NAUSEA AND VOMITING THAT IS NOT CONTROLLED WITH YOUR NAUSEA MEDICATION *UNUSUAL SHORTNESS OF BREATH *UNUSUAL BRUISING OR BLEEDING *URINARY PROBLEMS (pain or burning when urinating, or frequent urination) *BOWEL PROBLEMS (unusual diarrhea, constipation, pain near the anus) TENDERNESS IN MOUTH AND THROAT WITH OR WITHOUT PRESENCE OF ULCERS (sore throat, sores in mouth, or a toothache) UNUSUAL RASH, SWELLING OR PAIN  UNUSUAL VAGINAL DISCHARGE OR ITCHING   Items with * indicate a potential emergency and should be followed up as soon as possible or go to the Emergency Department if any problems should occur.  Please show the CHEMOTHERAPY ALERT CARD or IMMUNOTHERAPY ALERT  CARD at check-in to the Emergency Department and triage nurse.  Should you have questions after your visit or need to cancel or reschedule your appointment, please contact [facility]  [phone] and follow the prompts.  Office hours are 8:00 a.m. to 4:30 p.m. Monday - Friday. Please note that voicemails left after 4:00 p.m. may not be returned until the following business day.  We are closed weekends and major holidays. You have access to a nurse at all times for urgent questions. Please call the main number to the clinic [phone] and follow the prompts.  For any non-urgent questions, you may also contact your provider using MyChart. We now offer e-Visits for anyone 26 and older to request care online for non-urgent symptoms. For details visit [email or web address].   Also download the MyChart app! Go to the app store, search "MyChart", open the app, select [facility], and log in with your MyChart username and password.  Oxaliplatin Injection What is this medication? OXALIPLATIN (ox AL i PLA tin) treats some types of cancer. It works by slowing down the growth of cancer cells. This medicine may be used for other purposes; ask your health care provider or pharmacist if you have questions. COMMON BRAND NAME(S): Eloxatin What should I tell my care team before I take this medication? They need to know if you have any of these conditions: Heart disease History of irregular heartbeat or rhythm Liver disease Low blood cell levels (white cells, red cells, and platelets) Lung or breathing disease, such as asthma Take medications that treat or prevent blood clots Tingling of the  fingers, toes, or other nerve disorder An unusual or allergic reaction to oxaliplatin, other medications, foods, dyes, or preservatives If you or your partner are pregnant or trying to get pregnant Breast-feeding How should I use this medication? This medication is injected into a  vein. It is given by your care team in a hospital or clinic setting. Talk to your care team about the use of this medication in children. Special care may be needed. Overdosage: If you think you have taken too much of this medicine contact a poison control center or emergency room at once. NOTE: This medicine is only for you. Do not share this medicine with others. What if I miss a dose? Keep appointments for follow-up doses. It is important not to miss a dose. Call your care team if you are unable to keep an appointment. What may interact with this medication? Do not take this medication with any of the following: Cisapride Dronedarone Pimozide Thioridazine This medication may also interact with the following: Aspirin and aspirin-like medications Certain medications that treat or prevent blood clots, such as warfarin, apixaban, dabigatran, and rivaroxaban Cisplatin Cyclosporine Diuretics Medications for infection, such as acyclovir, adefovir, amphotericin B, bacitracin, cidofovir, foscarnet, ganciclovir, gentamicin, pentamidine, vancomycin NSAIDs, medications for pain and inflammation, such as ibuprofen or naproxen Other medications that cause heart rhythm changes Pamidronate Zoledronic acid This list may not describe all possible interactions. Give your health care provider a list of all the medicines, herbs, non-prescription drugs, or dietary supplements you use. Also tell them if you smoke, drink alcohol, or use illegal drugs. Some items may interact with your medicine. What should I watch for while using this medication? Your condition will be monitored carefully while you are receiving this medication. You may need blood work while taking this medication. This medication may make you feel generally unwell. This is not uncommon as chemotherapy can affect healthy cells as well as cancer cells. Report any side effects. Continue your course of treatment even though you feel ill unless  your care team tells you to stop. This medication may increase your risk of getting an infection. Call your care team for advice if you get a fever, chills, sore throat, or other symptoms of a cold or flu. Do not treat yourself. Try to avoid being around people who are sick. Avoid taking medications that contain aspirin, acetaminophen, ibuprofen, naproxen, or ketoprofen unless instructed by your care team. These medications may hide a fever. Be careful brushing or flossing your teeth or using a toothpick because you may get an infection or bleed more easily. If you have any dental work done, tell your dentist you are receiving this medication. This medication can make you more sensitive to cold. Do not drink cold drinks or use ice. Cover exposed skin before coming in contact with cold temperatures or cold objects. When out in cold weather wear warm clothing and cover your mouth and nose to warm the air that goes into your lungs. Tell your care team if you get sensitive to the cold. Talk to your care team if you or your partner are pregnant or think either of you might be pregnant. This medication can cause serious birth defects if taken during pregnancy and for 9 months after the last dose. A negative pregnancy test is required before starting this medication. A reliable form of contraception is recommended while taking this medication and for 9 months after the last dose. Talk to your care team about effective forms of contraception. Do  Do not father a child while taking this medication and for 6 months after the last dose. Use a condom while having sex during this time period. Do not breastfeed while taking this medication and for 3 months after the last dose. This medication may cause infertility. Talk to your care team if you are concerned about your fertility. What side effects may I notice from receiving this medication? Side effects that you should report to your care team as soon as possible: Allergic  reactions--skin rash, itching, hives, swelling of the face, lips, tongue, or throat Bleeding--bloody or black, tar-like stools, vomiting blood or brown material that looks like coffee grounds, red or dark brown urine, small red or purple spots on skin, unusual bruising or bleeding Dry cough, shortness of breath or trouble breathing Heart rhythm changes--fast or irregular heartbeat, dizziness, feeling faint or lightheaded, chest pain, trouble breathing Infection--fever, chills, cough, sore throat, wounds that don't heal, pain or trouble when passing urine, general feeling of discomfort or being unwell Liver injury--right upper belly pain, loss of appetite, nausea, light-colored stool, dark yellow or brown urine, yellowing skin or eyes, unusual weakness or fatigue Low red blood cell level--unusual weakness or fatigue, dizziness, headache, trouble breathing Muscle injury--unusual weakness or fatigue, muscle pain, dark yellow or brown urine, decrease in amount of urine Pain, tingling, or numbness in the hands or feet Sudden and severe headache, confusion, change in vision, seizures, which may be signs of posterior reversible encephalopathy syndrome (PRES) Unusual bruising or bleeding Side effects that usually do not require medical attention (report to your care team if they continue or are bothersome): Diarrhea Nausea Pain, redness, or swelling with sores inside the mouth or throat Unusual weakness or fatigue Vomiting This list may not describe all possible side effects. Call your doctor for medical advice about side effects. You may report side effects to FDA at 1-800-FDA-1088. Where should I keep my medication? This medication is given in a hospital or clinic. It will not be stored at home. NOTE: This sheet is a summary. It may not cover all possible information. If you have questions about this medicine, talk to your doctor, pharmacist, or health care provider.  2023 Elsevier/Gold Standard  (2007-12-24 00:00:00)  Leucovorin Injection What is this medication? LEUCOVORIN (loo koe VOR in) prevents side effects from certain medications, such as methotrexate. It works by increasing folate levels. This helps protect healthy cells in your body. It may also be used to treat anemia caused by low levels of folate. It can also be used with fluorouracil, a type of chemotherapy, to treat colorectal cancer. It works by increasing the effects of fluorouracil in the body. This medicine may be used for other purposes; ask your health care provider or pharmacist if you have questions. What should I tell my care team before I take this medication? They need to know if you have any of these conditions: Anemia from low levels of vitamin B12 in the blood An unusual or allergic reaction to leucovorin, folic acid, other medications, foods, dyes, or preservatives Pregnant or trying to get pregnant Breastfeeding How should I use this medication? This medication is injected into a vein or a muscle. It is given by your care team in a hospital or clinic setting. Talk to your care team about the use of this medication in children. Special care may be needed. Overdosage: If you think you have taken too much of this medicine contact a poison control center or emergency room at once. NOTE:   This medicine is only for you. Do not share this medicine with others. What if I miss a dose? Keep appointments for follow-up doses. It is important not to miss your dose. Call your care team if you are unable to keep an appointment. What may interact with this medication? Capecitabine Fluorouracil Phenobarbital Phenytoin Primidone Trimethoprim;sulfamethoxazole This list may not describe all possible interactions. Give your health care provider a list of all the medicines, herbs, non-prescription drugs, or dietary supplements you use. Also tell them if you smoke, drink alcohol, or use illegal drugs. Some items may interact  with your medicine. What should I watch for while using this medication? Your condition will be monitored carefully while you are receiving this medication. This medication may increase the side effects of 5-fluorouracil. Tell your care team if you have diarrhea or mouth sores that do not get better or that get worse. What side effects may I notice from receiving this medication? Side effects that you should report to your care team as soon as possible: Allergic reactions--skin rash, itching, hives, swelling of the face, lips, tongue, or throat This list may not describe all possible side effects. Call your doctor for medical advice about side effects. You may report side effects to FDA at 1-800-FDA-1088. Where should I keep my medication? This medication is given in a hospital or clinic. It will not be stored at home. NOTE: This sheet is a summary. It may not cover all possible information. If you have questions about this medicine, talk to your doctor, pharmacist, or health care provider.  2023 Elsevier/Gold Standard (2022-04-07 00:00:00)  Fluorouracil Injection What is this medication? FLUOROURACIL (flure oh YOOR a sil) treats some types of cancer. It works by slowing down the growth of cancer cells. This medicine may be used for other purposes; ask your health care provider or pharmacist if you have questions. COMMON BRAND NAME(S): Adrucil What should I tell my care team before I take this medication? They need to know if you have any of these conditions: Blood disorders Dihydropyrimidine dehydrogenase (DPD) deficiency Infection, such as chickenpox, cold sores, herpes Kidney disease Liver disease Poor nutrition Recent or ongoing radiation therapy An unusual or allergic reaction to fluorouracil, other medications, foods, dyes, or preservatives If you or your partner are pregnant or trying to get pregnant Breast-feeding How should I use this medication? This medication is injected  into a vein. It is administered by your care team in a hospital or clinic setting. Talk to your care team about the use of this medication in children. Special care may be needed. Overdosage: If you think you have taken too much of this medicine contact a poison control center or emergency room at once. NOTE: This medicine is only for you. Do not share this medicine with others. What if I miss a dose? Keep appointments for follow-up doses. It is important not to miss your dose. Call your care team if you are unable to keep an appointment. What may interact with this medication? Do not take this medication with any of the following: Live virus vaccines This medication may also interact with the following: Medications that treat or prevent blood clots, such as warfarin, enoxaparin, dalteparin This list may not describe all possible interactions. Give your health care provider a list of all the medicines, herbs, non-prescription drugs, or dietary supplements you use. Also tell them if you smoke, drink alcohol, or use illegal drugs. Some items may interact with your medicine. What should I watch for   while using this medication? Your condition will be monitored carefully while you are receiving this medication. This medication may make you feel generally unwell. This is not uncommon as chemotherapy can affect healthy cells as well as cancer cells. Report any side effects. Continue your course of treatment even though you feel ill unless your care team tells you to stop. In some cases, you may be given additional medications to help with side effects. Follow all directions for their use. This medication may increase your risk of getting an infection. Call your care team for advice if you get a fever, chills, sore throat, or other symptoms of a cold or flu. Do not treat yourself. Try to avoid being around people who are sick. This medication may increase your risk to bruise or bleed. Call your care team if  you notice any unusual bleeding. Be careful brushing or flossing your teeth or using a toothpick because you may get an infection or bleed more easily. If you have any dental work done, tell your dentist you are receiving this medication. Avoid taking medications that contain aspirin, acetaminophen, ibuprofen, naproxen, or ketoprofen unless instructed by your care team. These medications may hide a fever. Do not treat diarrhea with over the counter products. Contact your care team if you have diarrhea that lasts more than 2 days or if it is severe and watery. This medication can make you more sensitive to the sun. Keep out of the sun. If you cannot avoid being in the sun, wear protective clothing and sunscreen. Do not use sun lamps, tanning beds, or tanning booths. Talk to your care team if you or your partner wish to become pregnant or think you might be pregnant. This medication can cause serious birth defects if taken during pregnancy and for 3 months after the last dose. A reliable form of contraception is recommended while taking this medication and for 3 months after the last dose. Talk to your care team about effective forms of contraception. Do not father a child while taking this medication and for 3 months after the last dose. Use a condom while having sex during this time period. Do not breastfeed while taking this medication. This medication may cause infertility. Talk to your care team if you are concerned about your fertility. What side effects may I notice from receiving this medication? Side effects that you should report to your care team as soon as possible: Allergic reactions--skin rash, itching, hives, swelling of the face, lips, tongue, or throat Heart attack--pain or tightness in the chest, shoulders, arms, or jaw, nausea, shortness of breath, cold or clammy skin, feeling faint or lightheaded Heart failure--shortness of breath, swelling of the ankles, feet, or hands, sudden weight  gain, unusual weakness or fatigue Heart rhythm changes--fast or irregular heartbeat, dizziness, feeling faint or lightheaded, chest pain, trouble breathing High ammonia level--unusual weakness or fatigue, confusion, loss of appetite, nausea, vomiting, seizures Infection--fever, chills, cough, sore throat, wounds that don't heal, pain or trouble when passing urine, general feeling of discomfort or being unwell Low red blood cell level--unusual weakness or fatigue, dizziness, headache, trouble breathing Pain, tingling, or numbness in the hands or feet, muscle weakness, change in vision, confusion or trouble speaking, loss of balance or coordination, trouble walking, seizures Redness, swelling, and blistering of the skin over hands and feet Severe or prolonged diarrhea Unusual bruising or bleeding Side effects that usually do not require medical attention (report to your care team if they continue or are bothersome): Dry   skin Headache Increased tears Nausea Pain, redness, or swelling with sores inside the mouth or throat Sensitivity to light Vomiting This list may not describe all possible side effects. Call your doctor for medical advice about side effects. You may report side effects to FDA at 1-800-FDA-1088. Where should I keep my medication? This medication is given in a hospital or clinic. It will not be stored at home. NOTE: This sheet is a summary. It may not cover all possible information. If you have questions about this medicine, talk to your doctor, pharmacist, or health care provider.  2023 Elsevier/Gold Standard (2022-03-03 00:00:00)      

## 2022-12-31 ENCOUNTER — Other Ambulatory Visit: Payer: Self-pay

## 2022-12-31 ENCOUNTER — Other Ambulatory Visit (HOSPITAL_COMMUNITY): Payer: Self-pay

## 2022-12-31 ENCOUNTER — Ambulatory Visit
Admission: RE | Admit: 2022-12-31 | Discharge: 2022-12-31 | Disposition: A | Discharge status: Home / Self Care | Payer: Medicare HMO | Source: Ambulatory Visit | Attending: Radiation Oncology | Admitting: Radiation Oncology

## 2022-12-31 DIAGNOSIS — Z510 Encounter for antineoplastic radiation therapy: Secondary | ICD-10-CM | POA: Diagnosis not present

## 2022-12-31 DIAGNOSIS — I2510 Atherosclerotic heart disease of native coronary artery without angina pectoris: Secondary | ICD-10-CM | POA: Diagnosis not present

## 2022-12-31 DIAGNOSIS — E785 Hyperlipidemia, unspecified: Secondary | ICD-10-CM | POA: Diagnosis not present

## 2022-12-31 DIAGNOSIS — K219 Gastro-esophageal reflux disease without esophagitis: Secondary | ICD-10-CM | POA: Insufficient documentation

## 2022-12-31 DIAGNOSIS — C20 Malignant neoplasm of rectum: Secondary | ICD-10-CM | POA: Diagnosis not present

## 2022-12-31 DIAGNOSIS — F17210 Nicotine dependence, cigarettes, uncomplicated: Secondary | ICD-10-CM | POA: Diagnosis not present

## 2022-12-31 DIAGNOSIS — I10 Essential (primary) hypertension: Secondary | ICD-10-CM | POA: Diagnosis not present

## 2022-12-31 DIAGNOSIS — Z7982 Long term (current) use of aspirin: Secondary | ICD-10-CM | POA: Diagnosis not present

## 2022-12-31 DIAGNOSIS — Z7902 Long term (current) use of antithrombotics/antiplatelets: Secondary | ICD-10-CM | POA: Insufficient documentation

## 2022-12-31 DIAGNOSIS — Z7984 Long term (current) use of oral hypoglycemic drugs: Secondary | ICD-10-CM | POA: Diagnosis not present

## 2022-12-31 DIAGNOSIS — E119 Type 2 diabetes mellitus without complications: Secondary | ICD-10-CM | POA: Insufficient documentation

## 2022-12-31 DIAGNOSIS — Z79899 Other long term (current) drug therapy: Secondary | ICD-10-CM | POA: Diagnosis not present

## 2022-12-31 DIAGNOSIS — Z87442 Personal history of urinary calculi: Secondary | ICD-10-CM | POA: Insufficient documentation

## 2022-12-31 MED ORDER — CAPECITABINE 500 MG PO TABS
1500.0000 mg | ORAL_TABLET | Freq: Two times a day (BID) | ORAL | 0 refills | Status: DC
  Filled 2022-12-31 (×3): qty 168, 28d supply, fill #0

## 2023-01-01 ENCOUNTER — Inpatient Hospital Stay: Payer: Medicare HMO

## 2023-01-01 ENCOUNTER — Other Ambulatory Visit (HOSPITAL_COMMUNITY): Payer: Self-pay

## 2023-01-01 ENCOUNTER — Other Ambulatory Visit: Payer: Self-pay

## 2023-01-01 VITALS — Temp 96.8°F

## 2023-01-01 DIAGNOSIS — E1165 Type 2 diabetes mellitus with hyperglycemia: Secondary | ICD-10-CM | POA: Diagnosis not present

## 2023-01-01 DIAGNOSIS — C20 Malignant neoplasm of rectum: Secondary | ICD-10-CM

## 2023-01-01 DIAGNOSIS — Z5111 Encounter for antineoplastic chemotherapy: Secondary | ICD-10-CM | POA: Diagnosis not present

## 2023-01-01 DIAGNOSIS — E876 Hypokalemia: Secondary | ICD-10-CM | POA: Diagnosis not present

## 2023-01-01 DIAGNOSIS — D649 Anemia, unspecified: Secondary | ICD-10-CM | POA: Diagnosis not present

## 2023-01-01 DIAGNOSIS — E119 Type 2 diabetes mellitus without complications: Secondary | ICD-10-CM | POA: Diagnosis not present

## 2023-01-01 DIAGNOSIS — I2510 Atherosclerotic heart disease of native coronary artery without angina pectoris: Secondary | ICD-10-CM | POA: Diagnosis not present

## 2023-01-01 DIAGNOSIS — D696 Thrombocytopenia, unspecified: Secondary | ICD-10-CM | POA: Diagnosis not present

## 2023-01-01 DIAGNOSIS — K219 Gastro-esophageal reflux disease without esophagitis: Secondary | ICD-10-CM | POA: Diagnosis not present

## 2023-01-01 MED ORDER — HEPARIN SOD (PORK) LOCK FLUSH 100 UNIT/ML IV SOLN
500.0000 [IU] | Freq: Once | INTRAVENOUS | Status: AC | PRN
  Administered 2023-01-01: 500 [IU]
  Filled 2023-01-01: qty 5

## 2023-01-01 MED ORDER — SODIUM CHLORIDE 0.9% FLUSH
10.0000 mL | INTRAVENOUS | Status: DC | PRN
  Administered 2023-01-01: 10 mL
  Filled 2023-01-01: qty 10

## 2023-01-01 MED ORDER — PEGFILGRASTIM-CBQV 6 MG/0.6ML ~~LOC~~ SOSY
6.0000 mg | PREFILLED_SYRINGE | Freq: Once | SUBCUTANEOUS | Status: AC
  Administered 2023-01-01: 6 mg via SUBCUTANEOUS
  Filled 2023-01-01: qty 0.6

## 2023-01-01 NOTE — Patient Instructions (Signed)

## 2023-01-05 DIAGNOSIS — Z510 Encounter for antineoplastic radiation therapy: Secondary | ICD-10-CM | POA: Diagnosis not present

## 2023-01-05 DIAGNOSIS — I10 Essential (primary) hypertension: Secondary | ICD-10-CM | POA: Diagnosis not present

## 2023-01-05 DIAGNOSIS — Z87442 Personal history of urinary calculi: Secondary | ICD-10-CM | POA: Diagnosis not present

## 2023-01-05 DIAGNOSIS — C20 Malignant neoplasm of rectum: Secondary | ICD-10-CM | POA: Diagnosis not present

## 2023-01-05 DIAGNOSIS — E785 Hyperlipidemia, unspecified: Secondary | ICD-10-CM | POA: Diagnosis not present

## 2023-01-05 DIAGNOSIS — I2510 Atherosclerotic heart disease of native coronary artery without angina pectoris: Secondary | ICD-10-CM | POA: Diagnosis not present

## 2023-01-05 DIAGNOSIS — E119 Type 2 diabetes mellitus without complications: Secondary | ICD-10-CM | POA: Diagnosis not present

## 2023-01-05 DIAGNOSIS — F17210 Nicotine dependence, cigarettes, uncomplicated: Secondary | ICD-10-CM | POA: Diagnosis not present

## 2023-01-05 DIAGNOSIS — K219 Gastro-esophageal reflux disease without esophagitis: Secondary | ICD-10-CM | POA: Diagnosis not present

## 2023-01-06 ENCOUNTER — Ambulatory Visit: Payer: Medicare HMO

## 2023-01-07 ENCOUNTER — Inpatient Hospital Stay (HOSPITAL_BASED_OUTPATIENT_CLINIC_OR_DEPARTMENT_OTHER): Payer: Medicare HMO | Admitting: Oncology

## 2023-01-07 ENCOUNTER — Encounter: Payer: Self-pay | Admitting: Oncology

## 2023-01-07 ENCOUNTER — Ambulatory Visit: Admission: RE | Admit: 2023-01-07 | Payer: Medicare HMO | Source: Ambulatory Visit

## 2023-01-07 ENCOUNTER — Inpatient Hospital Stay: Payer: Medicare HMO | Admitting: Pharmacist

## 2023-01-07 ENCOUNTER — Ambulatory Visit: Payer: Medicare HMO

## 2023-01-07 VITALS — BP 148/72 | HR 87 | Temp 96.5°F | Resp 16 | Ht 72.0 in | Wt 188.0 lb

## 2023-01-07 DIAGNOSIS — K219 Gastro-esophageal reflux disease without esophagitis: Secondary | ICD-10-CM | POA: Diagnosis not present

## 2023-01-07 DIAGNOSIS — C20 Malignant neoplasm of rectum: Secondary | ICD-10-CM

## 2023-01-07 DIAGNOSIS — E876 Hypokalemia: Secondary | ICD-10-CM | POA: Diagnosis not present

## 2023-01-07 DIAGNOSIS — E1159 Type 2 diabetes mellitus with other circulatory complications: Secondary | ICD-10-CM | POA: Diagnosis not present

## 2023-01-07 DIAGNOSIS — E119 Type 2 diabetes mellitus without complications: Secondary | ICD-10-CM | POA: Diagnosis not present

## 2023-01-07 DIAGNOSIS — E1142 Type 2 diabetes mellitus with diabetic polyneuropathy: Secondary | ICD-10-CM | POA: Diagnosis not present

## 2023-01-07 DIAGNOSIS — Z5111 Encounter for antineoplastic chemotherapy: Secondary | ICD-10-CM | POA: Diagnosis not present

## 2023-01-07 DIAGNOSIS — Z87442 Personal history of urinary calculi: Secondary | ICD-10-CM | POA: Diagnosis not present

## 2023-01-07 DIAGNOSIS — D696 Thrombocytopenia, unspecified: Secondary | ICD-10-CM | POA: Diagnosis not present

## 2023-01-07 DIAGNOSIS — I2510 Atherosclerotic heart disease of native coronary artery without angina pectoris: Secondary | ICD-10-CM | POA: Diagnosis not present

## 2023-01-07 DIAGNOSIS — I152 Hypertension secondary to endocrine disorders: Secondary | ICD-10-CM | POA: Diagnosis not present

## 2023-01-07 DIAGNOSIS — I10 Essential (primary) hypertension: Secondary | ICD-10-CM | POA: Diagnosis not present

## 2023-01-07 DIAGNOSIS — F17210 Nicotine dependence, cigarettes, uncomplicated: Secondary | ICD-10-CM | POA: Diagnosis not present

## 2023-01-07 DIAGNOSIS — E1169 Type 2 diabetes mellitus with other specified complication: Secondary | ICD-10-CM | POA: Diagnosis not present

## 2023-01-07 DIAGNOSIS — D649 Anemia, unspecified: Secondary | ICD-10-CM | POA: Diagnosis not present

## 2023-01-07 DIAGNOSIS — Z510 Encounter for antineoplastic radiation therapy: Secondary | ICD-10-CM | POA: Diagnosis not present

## 2023-01-07 DIAGNOSIS — E785 Hyperlipidemia, unspecified: Secondary | ICD-10-CM | POA: Diagnosis not present

## 2023-01-07 DIAGNOSIS — E1165 Type 2 diabetes mellitus with hyperglycemia: Secondary | ICD-10-CM | POA: Diagnosis not present

## 2023-01-07 NOTE — Progress Notes (Signed)
[place]  Telephone:(336) [phone] Fax:(336) [phone]  ID: [name] [name] OB: Mar 27, 1946  MR#: [id number]  [id number]  Patient Care Team: [facility] as PCP - General [name], [name] November, MD as PCP - Cardiology (Cardiology) [name], MD as Consulting Physician (Cardiology) [name], RN as Oncology Nurse Navigator Grayland Ormond, [name] November, MD as Consulting Physician (Oncology)  CHIEF COMPLAINT: Stage IIa rectal adenocarcinoma.  INTERVAL HISTORY: Patient returns to clinic today for further evaluation and discussion of initiating Xeloda plus XRT.  He currently feels well and is asymptomatic. He has no neurologic complaints.  He denies any recent fevers or illnesses.  He has no chest pain, shortness of breath, cough, or hemoptysis.  He denies any nausea, vomiting, constipation, or diarrhea.  He does not report any melena or hematochezia.  He has no urinary complaints.  Patient offers no specific complaints today.  REVIEW OF SYSTEMS:   Review of Systems  Constitutional: Negative.  Negative for fever, malaise/fatigue and weight loss.  Respiratory: Negative.  Negative for cough, hemoptysis and shortness of breath.   Cardiovascular: Negative.  Negative for chest pain and leg swelling.  Gastrointestinal: Negative.  Negative for abdominal pain.  Genitourinary: Negative.  Negative for dysuria.  Musculoskeletal: Negative.  Negative for back pain.  Skin: Negative.  Negative for rash.  Neurological: Negative.  Negative for dizziness, focal weakness, weakness and headaches.  Psychiatric/Behavioral: Negative.  The patient is not nervous/anxious.     As per HPI. Otherwise, a complete review of systems is negative.  PAST MEDICAL HISTORY: Past Medical History:  Diagnosis Date   Anemia    Anginal pain (HCC)    Bradycardia    Cancer (HCC)    Coronary artery disease    x 1 stent   Depression    Diabetes mellitus without complication (HCC)     GERD (gastroesophageal reflux disease)    History of kidney stones    Hyperlipidemia    Hypertension    Nephrolithiasis     PAST SURGICAL HISTORY: Past Surgical History:  Procedure Laterality Date   CARDIAC CATHETERIZATION     CATARACT EXTRACTION     CORONARY ANGIOPLASTY     FLEXIBLE SIGMOIDOSCOPY N/A 07/27/2022   Procedure: FLEXIBLE SIGMOIDOSCOPY;  Surgeon: Lesly Rubenstein, MD;  Location: [facility] ENDOSCOPY;  Service: Endoscopy;  Laterality: N/A;   KIDNEY STONE SURGERY     NECK SURGERY  12/18/2011   nephrolithiasis     PORTACATH PLACEMENT Right 08/24/2022   Procedure: INSERTION PORT-A-CATH;  Surgeon: Ronny Bacon, MD;  Location: [facility] ORS;  Service: General;  Laterality: Right;   stent (other)     TRANSVERSE LOOP COLOSTOMY N/A 07/28/2022   Procedure: TRANSVERSE LOOP COLOSTOMY;  Surgeon: Ronny Bacon, MD;  Location: [facility] ORS;  Service: General;  Laterality: N/A;    FAMILY HISTORY: Family History  Problem Relation Age of Onset   Heart disease Father     ADVANCED DIRECTIVES (Y/N):  N  HEALTH MAINTENANCE: Social History   Tobacco Use   Smoking status: Every Day    Packs/day: 0.50    Years: 36.00    Total pack years: 18.00    Types: Cigarettes   Smokeless tobacco: Never  Vaping Use   Vaping Use: Never used  Substance Use Topics   Alcohol use: No   Drug use: No     Colonoscopy:  PAP:  Bone density:  Lipid panel:  No Known Allergies  Current Outpatient Medications  Medication Sig Dispense Refill   aspirin  81 MG EC tablet Take 81 mg by mouth daily.       atorvastatin (LIPITOR) 40 MG tablet Take 1 tablet (40 mg total) by mouth daily. 90 tablet 3   capecitabine (XELODA) 500 MG tablet Take 3 tablets (1,500 mg total) by mouth 2 (two) times daily after a meal. Take Monday-Friday. Take only on days of radiation. 168 tablet 0   cyanocobalamin (VITAMIN B12) 500 MCG tablet Take 500 mcg by mouth daily.     famotidine (PEPCID) 40 MG tablet Take 1 tablet (40 mg  total) by mouth daily. 90 tablet 3   glimepiride (AMARYL) 4 MG tablet Take 4 mg by mouth daily with breakfast.     HYDROcodone-acetaminophen (NORCO/VICODIN) 5-325 MG tablet Take 1 tablet by mouth every 6 (six) hours as needed for moderate pain.     ibuprofen (ADVIL) 800 MG tablet Take 1 tablet (800 mg total) by mouth every 8 (eight) hours as needed. 30 tablet 0   isosorbide mononitrate (IMDUR) 30 MG 24 hr tablet Take 1 tablet (30 mg total) by mouth daily. 90 tablet 3   lidocaine-prilocaine (EMLA) cream Apply to affected area once 30 g 3   metFORMIN (GLUCOPHAGE) 1000 MG tablet Take 1,000 mg by mouth 2 (two) times daily with a meal.     nitroGLYCERIN (NITROSTAT) 0.4 MG SL tablet Place 1 tablet (0.4 mg total) under the tongue every 5 (five) minutes as needed for chest pain. 25 tablet 6   ondansetron (ZOFRAN) 8 MG tablet Take 1 tablet (8 mg total) by mouth every 8 (eight) hours as needed for nausea or vomiting. 60 tablet 2   pioglitazone (ACTOS) 15 MG tablet Take 15 mg by mouth daily.     prochlorperazine (COMPAZINE) 10 MG tablet Take 1 tablet (10 mg total) by mouth every 6 (six) hours as needed for nausea or vomiting. 60 tablet 2   clopidogrel (PLAVIX) 75 MG tablet Take 75 mg by mouth daily. (Patient not taking: Reported on 09/01/2022)     No current facility-administered medications for this visit.    OBJECTIVE: Vitals:   01/07/23 1359  BP: (!) 148/72  Pulse: 87  Resp: 16  Temp: (!) 96.5 F (35.8 C)  SpO2: 100%      Body mass index is 25.5 kg/m.    ECOG FS:0 - Asymptomatic  General: Well-developed, well-nourished, no acute distress. Eyes: Pink conjunctiva, anicteric sclera. HEENT: Normocephalic, moist mucous membranes. Lungs: No audible wheezing or coughing. Heart: Regular rate and rhythm. Abdomen: Soft, nontender, no obvious distention. Musculoskeletal: No edema, cyanosis, or clubbing. Neuro: Alert, answering all questions appropriately. Cranial nerves grossly intact. Skin: No  rashes or petechiae noted. Psych: Normal affect.  LAB RESULTS:  Lab Results  Component Value Date   NA 136 12/30/2022   K 3.5 12/30/2022   CL 101 12/30/2022   CO2 23 12/30/2022   GLUCOSE 249 (H) 12/30/2022   BUN 18 12/30/2022   CREATININE 0.82 12/30/2022   CALCIUM 8.8 (L) 12/30/2022   PROT 6.6 12/30/2022   ALBUMIN 3.7 12/30/2022   AST 30 12/30/2022   ALT 26 12/30/2022   ALKPHOS 113 12/30/2022   BILITOT 0.5 12/30/2022   GFRNONAA >60 12/30/2022   GFRAA >60 12/20/2014    Lab Results  Component Value Date   WBC 8.2 12/30/2022   NEUTROABS 6.4 12/30/2022   HGB 9.3 (L) 12/30/2022   HCT 30.4 (L) 12/30/2022   MCV 89.9 12/30/2022   PLT 134 (L) 12/30/2022     STUDIES: No results  found.  ASSESSMENT: Stage IIa rectal adenocarcinoma.  PLAN:    Stage IIa rectal adenocarcinoma: Diagnosis confirmed by imaging and biopsy.  MRI and CT scan results reviewed independently and reported as above confirming stage of disease.  Patient has now completed neoadjuvant FOLFOX receiving his final dose on December 30, 2022.  He will initiate Xeloda plus XRT next week.  Once he completes XRT, will be referred for definitive surgery.  Return to clinic in 2 weeks for laboratory work and further evaluation.  Appreciate clinical pharmacy input.   Anemia: Chronic and unchanged.  Patient's most recent hemoglobin is 9.3.   Neutropenia: Resolved.   Thrombocytopenia: Platelets improved to 134.   Hyperglycemia: Patient continues to have poor control with blood glucose ranging mostly over 200.  Continue monitoring and treatment per primary care. Hypokalemia: Resolved.  I spent a total of 30 minutes reviewing chart data, face-to-face evaluation with the patient, counseling and coordination of care as detailed above.   Patient expressed understanding and was in agreement with this plan. He also understands that He can call clinic at any time with any questions, concerns, or complaints.    Cancer Staging   Rectal adenocarcinoma [facility]) Staging form: Colon and Rectum, AJCC 8th Edition - Clinical stage from 08/13/2022: Stage IIA (cT3, cN0, cM0) - Signed by Lloyd Huger, MD on 08/13/2022 Total positive nodes: 0  Lloyd Huger, MD   01/07/2023 5:01 PM

## 2023-01-07 NOTE — Progress Notes (Signed)
[place]  Telephone:([phone] Fax:([phone]  Patient Care Team: [facility], [facility] as PCP - General [name], [name] November, MD as PCP - Cardiology (Cardiology) [name], MD as Consulting Physician (Cardiology) [name], RN as Oncology Nurse Navigator Lloyd Huger, MD as Consulting Physician (Oncology)   Name of the patient: [name] [name]  [id number]  Feb 02, 1946   Date of visit: 01/07/23  HPI: Patient is a 77 y.o. male with stage IIa rectal adenocarcinoma. Patient is completing neoadjuvant treatment, he has finished FOLFOX and on 01/11/23 will begin XRT and capecitabine.  Reason for Consult: Xeloda (capecitabine) oral chemotherapy education.   PAST MEDICAL HISTORY: Past Medical History:  Diagnosis Date   Anemia    Anginal pain (HCC)    Bradycardia    Cancer (HCC)    Coronary artery disease    x 1 stent   Depression    Diabetes mellitus without complication (HCC)    GERD (gastroesophageal reflux disease)    History of kidney stones    Hyperlipidemia    Hypertension    Nephrolithiasis     HEMATOLOGY/ONCOLOGY HISTORY:  Oncology History  Rectal adenocarcinoma ([place])  08/07/2022 Initial Diagnosis   Rectal adenocarcinoma ([place])   08/13/2022 Cancer Staging   Staging form: Colon and Rectum, AJCC 8th Edition - Clinical stage from 08/13/2022: Stage IIA (cT3, cN0, cM0) - Signed by Lloyd Huger, MD on 08/13/2022 Total positive nodes: 0   08/25/2022 -  Chemotherapy   Patient is on Treatment Plan : COLORECTAL FOLFOX q14d x 4 months       ALLERGIES:  has No Known Allergies.  MEDICATIONS:  Current Outpatient Medications  Medication Sig Dispense Refill   aspirin 81 MG EC tablet Take 81 mg by mouth daily.       atorvastatin (LIPITOR) 40 MG tablet Take 1 tablet (40 mg total) by mouth daily. 90 tablet 3   capecitabine (XELODA) 500 MG tablet Take 3 tablets (1,500 mg total) by mouth 2 (two) times  daily after a meal. Take Monday-Friday. Take only on days of radiation. 168 tablet 0   clopidogrel (PLAVIX) 75 MG tablet Take 75 mg by mouth daily. (Patient not taking: Reported on 09/01/2022)     cyanocobalamin (VITAMIN B12) 500 MCG tablet Take 500 mcg by mouth daily.     famotidine (PEPCID) 40 MG tablet Take 1 tablet (40 mg total) by mouth daily. 90 tablet 3   glimepiride (AMARYL) 4 MG tablet Take 4 mg by mouth daily with breakfast.     HYDROcodone-acetaminophen (NORCO/VICODIN) 5-325 MG tablet Take 1 tablet by mouth every 6 (six) hours as needed for moderate pain.     ibuprofen (ADVIL) 800 MG tablet Take 1 tablet (800 mg total) by mouth every 8 (eight) hours as needed. 30 tablet 0   isosorbide mononitrate (IMDUR) 30 MG 24 hr tablet Take 1 tablet (30 mg total) by mouth daily. 90 tablet 3   lidocaine-prilocaine (EMLA) cream Apply to affected area once 30 g 3   metFORMIN (GLUCOPHAGE) 1000 MG tablet Take 1,000 mg by mouth 2 (two) times daily with a meal.     nitroGLYCERIN (NITROSTAT) 0.4 MG SL tablet Place 1 tablet (0.4 mg total) under the tongue every 5 (five) minutes as needed for chest pain. 25 tablet 6   ondansetron (ZOFRAN) 8 MG tablet Take 1 tablet (8 mg total) by mouth every 8 (eight) hours as needed for nausea or vomiting. 60 tablet 2   pioglitazone (  ACTOS) 15 MG tablet Take 15 mg by mouth daily.     prochlorperazine (COMPAZINE) 10 MG tablet Take 1 tablet (10 mg total) by mouth every 6 (six) hours as needed for nausea or vomiting. 60 tablet 2   No current facility-administered medications for this visit.    VITAL SIGNS: There were no vitals taken for this visit. There were no vitals filed for this visit.  Estimated body mass index is 25.5 kg/m as calculated from the following:   Height as of an earlier encounter on 01/07/23: 6' (1.829 m).   Weight as of an earlier encounter on 01/07/23: 85.3 kg (188 lb).  LABS: CBC:    Component Value Date/Time   WBC 8.2 12/30/2022 0931   HGB 9.3  (L) 12/30/2022 0931   HGB 15.3 12/20/2014 1258   HCT 30.4 (L) 12/30/2022 0931   HCT 46.1 12/20/2014 1258   PLT 134 (L) 12/30/2022 0931   PLT 163 12/20/2014 1258   MCV 89.9 12/30/2022 0931   MCV 92 12/20/2014 1258   NEUTROABS 6.4 12/30/2022 0931   NEUTROABS 7.3 (H) 12/20/2014 1258   LYMPHSABS 1.1 12/30/2022 0931   LYMPHSABS 1.6 12/20/2014 1258   MONOABS 0.6 12/30/2022 0931   MONOABS 0.8 12/20/2014 1258   EOSABS 0.1 12/30/2022 0931   EOSABS 0.1 12/20/2014 1258   BASOSABS 0.0 12/30/2022 0931   BASOSABS 0.1 12/20/2014 1258   Comprehensive Metabolic Panel:    Component Value Date/Time   NA 136 12/30/2022 0931   NA 135 (L) 12/20/2014 1419   K 3.5 12/30/2022 0931   K 4.5 12/20/2014 1419   CL 101 12/30/2022 0931   CL 100 12/20/2014 1419   CO2 23 12/30/2022 0931   CO2 19 (L) 12/20/2014 1419   BUN 18 12/30/2022 0931   BUN 16 12/20/2014 1419   CREATININE 0.82 12/30/2022 0931   CREATININE 1.05 12/20/2014 1419   GLUCOSE 249 (H) 12/30/2022 0931   GLUCOSE 290 (H) 12/20/2014 1419   CALCIUM 8.8 (L) 12/30/2022 0931   CALCIUM 8.6 12/20/2014 1419   AST 30 12/30/2022 0931   ALT 26 12/30/2022 0931   ALKPHOS 113 12/30/2022 0931   BILITOT 0.5 12/30/2022 0931   BILITOT 0.4 10/19/2017 1139   PROT 6.6 12/30/2022 0931   PROT 6.8 10/19/2017 1139   ALBUMIN 3.7 12/30/2022 0931   ALBUMIN 4.6 10/19/2017 1139     Present during today's visit: patient only  Start plan: patient will start capecitabine on 01/11/23 along with his radiation start   Patient Education I spoke with patient for overview of new oral chemotherapy medication: Xeloda (capecitabine)   Administration: Counseled patient on administration, dosing, side effects, monitoring, drug-food interactions, safe handling, storage, and disposal. Patient will take 3 tablets ('1500mg'$ ) by mouth twice a day. Take Monday through Friday on radiation days.  Side Effects: Side effects include but not limited to: diarrhea, hand-foot syndrome,  mouth sores, edema, decreased wbc, fatigue, N/V Nausea/vomiting: patient has anti-nausea medications and confirmed how and when to use them. Instructed to call the office if he is using them multiple times throughout the day Hand-Foot Syndrome: patient supplied with Udderly Smooth and instructed to moisturize skin. Patient mentioned that radiation oncology had told him to avoid certain soaps/lotions in the area he will have radiation to. Advised patient to follow advice of radiation oncology, but that he could use the Udderly Smooth on his hands and feet. Diarrhea: patient has a colostomy bag; advised to look for changes and to call the office if  changes are noticed. Mouth irritation: patient advised to avoid alcohol-based mouthwashes, and to use a baking soda/salt/warm water rinse. Patient instructed to call the office if mouth irritation starts to affect the patient's ability to eat.   Drug-drug Interactions (DDI): No current DDIS with capecitabine  Adherence: After discussion with patient there are no patient barriers to medication adherence identified.  Reviewed with patient importance of keeping a medication schedule and plan for any missed doses.  Mr. Deso voiced understanding and appreciation. All questions answered. Medication handout provided.  Provided patient with [facility] phone number. Patient knows to call the office with questions or concerns. Oral [facility]otherapy [facility] will continue to follow.  Patient expressed understanding and was in agreement with this plan. He also understands that He can call clinic at any time with any questions, concerns, or complaints.   Medication Access Issues: Patient already has medication on hand that will go through all radiation treatments.  Thank you for allowing me to participate in the care of this patient.   Time Total: 25 minutes  Visit consisted of counseling and education on dealing with issues of  symptom management in the setting of serious and potentially life-threatening illness.Greater than 50%  of this time was spent counseling and coordinating care related to the above assessment and plan.  Education completed by Lidia Collum, PharmD Oncology PGY2  Signed by: Darl Pikes, PharmD, BCPS, Salley Slaughter, CPP Hematology/Oncology Clinical Pharmacist Practitioner [place]/DB/AP [facility] [phone]  01/07/2023 3:01 PM

## 2023-01-11 ENCOUNTER — Other Ambulatory Visit: Payer: Self-pay

## 2023-01-11 ENCOUNTER — Ambulatory Visit
Admission: RE | Admit: 2023-01-11 | Discharge: 2023-01-11 | Disposition: A | Discharge status: Home / Self Care | Payer: Medicare HMO | Source: Ambulatory Visit | Attending: Radiation Oncology | Admitting: Radiation Oncology

## 2023-01-11 DIAGNOSIS — Z87442 Personal history of urinary calculi: Secondary | ICD-10-CM | POA: Diagnosis not present

## 2023-01-11 DIAGNOSIS — I10 Essential (primary) hypertension: Secondary | ICD-10-CM | POA: Diagnosis not present

## 2023-01-11 DIAGNOSIS — F17210 Nicotine dependence, cigarettes, uncomplicated: Secondary | ICD-10-CM | POA: Diagnosis not present

## 2023-01-11 DIAGNOSIS — E785 Hyperlipidemia, unspecified: Secondary | ICD-10-CM | POA: Diagnosis not present

## 2023-01-11 DIAGNOSIS — E119 Type 2 diabetes mellitus without complications: Secondary | ICD-10-CM | POA: Diagnosis not present

## 2023-01-11 DIAGNOSIS — C20 Malignant neoplasm of rectum: Secondary | ICD-10-CM | POA: Diagnosis not present

## 2023-01-11 DIAGNOSIS — Z510 Encounter for antineoplastic radiation therapy: Secondary | ICD-10-CM | POA: Diagnosis not present

## 2023-01-11 DIAGNOSIS — I2510 Atherosclerotic heart disease of native coronary artery without angina pectoris: Secondary | ICD-10-CM | POA: Diagnosis not present

## 2023-01-11 DIAGNOSIS — K219 Gastro-esophageal reflux disease without esophagitis: Secondary | ICD-10-CM | POA: Diagnosis not present

## 2023-01-11 LAB — RAD ONC ARIA SESSION SUMMARY
Course Elapsed Days: 0
Plan Fractions Treated to Date: 1
Plan Prescribed Dose Per Fraction: 1.8 Gy
Plan Total Fractions Prescribed: 25
Plan Total Prescribed Dose: 45 Gy
Reference Point Dosage Given to Date: 1.8 Gy
Reference Point Session Dosage Given: 1.8 Gy
Session Number: 1

## 2023-01-12 ENCOUNTER — Other Ambulatory Visit: Payer: Self-pay

## 2023-01-12 ENCOUNTER — Ambulatory Visit
Admission: RE | Admit: 2023-01-12 | Discharge: 2023-01-12 | Disposition: A | Discharge status: Home / Self Care | Payer: Medicare HMO | Source: Ambulatory Visit | Attending: Radiation Oncology | Admitting: Radiation Oncology

## 2023-01-12 DIAGNOSIS — Z87442 Personal history of urinary calculi: Secondary | ICD-10-CM | POA: Diagnosis not present

## 2023-01-12 DIAGNOSIS — C20 Malignant neoplasm of rectum: Secondary | ICD-10-CM | POA: Diagnosis not present

## 2023-01-12 DIAGNOSIS — I10 Essential (primary) hypertension: Secondary | ICD-10-CM | POA: Diagnosis not present

## 2023-01-12 DIAGNOSIS — E785 Hyperlipidemia, unspecified: Secondary | ICD-10-CM | POA: Diagnosis not present

## 2023-01-12 DIAGNOSIS — F17210 Nicotine dependence, cigarettes, uncomplicated: Secondary | ICD-10-CM | POA: Diagnosis not present

## 2023-01-12 DIAGNOSIS — K219 Gastro-esophageal reflux disease without esophagitis: Secondary | ICD-10-CM | POA: Diagnosis not present

## 2023-01-12 DIAGNOSIS — I2510 Atherosclerotic heart disease of native coronary artery without angina pectoris: Secondary | ICD-10-CM | POA: Diagnosis not present

## 2023-01-12 DIAGNOSIS — E119 Type 2 diabetes mellitus without complications: Secondary | ICD-10-CM | POA: Diagnosis not present

## 2023-01-12 DIAGNOSIS — Z510 Encounter for antineoplastic radiation therapy: Secondary | ICD-10-CM | POA: Diagnosis not present

## 2023-01-12 LAB — RAD ONC ARIA SESSION SUMMARY
Course Elapsed Days: 1
Plan Fractions Treated to Date: 2
Plan Prescribed Dose Per Fraction: 1.8 Gy
Plan Total Fractions Prescribed: 25
Plan Total Prescribed Dose: 45 Gy
Reference Point Dosage Given to Date: 3.6 Gy
Reference Point Session Dosage Given: 1.8 Gy
Session Number: 2

## 2023-01-13 ENCOUNTER — Ambulatory Visit
Admission: RE | Admit: 2023-01-13 | Discharge: 2023-01-13 | Disposition: A | Discharge status: Home / Self Care | Payer: Medicare HMO | Source: Ambulatory Visit | Attending: Radiation Oncology | Admitting: Radiation Oncology

## 2023-01-13 ENCOUNTER — Other Ambulatory Visit: Payer: Self-pay

## 2023-01-13 DIAGNOSIS — K219 Gastro-esophageal reflux disease without esophagitis: Secondary | ICD-10-CM | POA: Diagnosis not present

## 2023-01-13 DIAGNOSIS — E785 Hyperlipidemia, unspecified: Secondary | ICD-10-CM | POA: Diagnosis not present

## 2023-01-13 DIAGNOSIS — E119 Type 2 diabetes mellitus without complications: Secondary | ICD-10-CM | POA: Diagnosis not present

## 2023-01-13 DIAGNOSIS — I2510 Atherosclerotic heart disease of native coronary artery without angina pectoris: Secondary | ICD-10-CM | POA: Diagnosis not present

## 2023-01-13 DIAGNOSIS — Z87442 Personal history of urinary calculi: Secondary | ICD-10-CM | POA: Diagnosis not present

## 2023-01-13 DIAGNOSIS — C20 Malignant neoplasm of rectum: Secondary | ICD-10-CM | POA: Diagnosis not present

## 2023-01-13 DIAGNOSIS — I10 Essential (primary) hypertension: Secondary | ICD-10-CM | POA: Diagnosis not present

## 2023-01-13 DIAGNOSIS — Z510 Encounter for antineoplastic radiation therapy: Secondary | ICD-10-CM | POA: Diagnosis not present

## 2023-01-13 DIAGNOSIS — F17210 Nicotine dependence, cigarettes, uncomplicated: Secondary | ICD-10-CM | POA: Diagnosis not present

## 2023-01-13 LAB — RAD ONC ARIA SESSION SUMMARY
Course Elapsed Days: 2
Plan Fractions Treated to Date: 3
Plan Prescribed Dose Per Fraction: 1.8 Gy
Plan Total Fractions Prescribed: 25
Plan Total Prescribed Dose: 45 Gy
Reference Point Dosage Given to Date: 5.4 Gy
Reference Point Session Dosage Given: 1.8 Gy
Session Number: 3

## 2023-01-14 ENCOUNTER — Ambulatory Visit
Admission: RE | Admit: 2023-01-14 | Discharge: 2023-01-14 | Disposition: A | Discharge status: Home / Self Care | Payer: Medicare HMO | Source: Ambulatory Visit | Attending: Radiation Oncology | Admitting: Radiation Oncology

## 2023-01-14 ENCOUNTER — Other Ambulatory Visit: Payer: Self-pay

## 2023-01-14 DIAGNOSIS — I10 Essential (primary) hypertension: Secondary | ICD-10-CM | POA: Diagnosis not present

## 2023-01-14 DIAGNOSIS — C20 Malignant neoplasm of rectum: Secondary | ICD-10-CM | POA: Diagnosis not present

## 2023-01-14 DIAGNOSIS — E785 Hyperlipidemia, unspecified: Secondary | ICD-10-CM | POA: Diagnosis not present

## 2023-01-14 DIAGNOSIS — E119 Type 2 diabetes mellitus without complications: Secondary | ICD-10-CM | POA: Diagnosis not present

## 2023-01-14 DIAGNOSIS — I2510 Atherosclerotic heart disease of native coronary artery without angina pectoris: Secondary | ICD-10-CM | POA: Diagnosis not present

## 2023-01-14 DIAGNOSIS — Z510 Encounter for antineoplastic radiation therapy: Secondary | ICD-10-CM | POA: Diagnosis not present

## 2023-01-14 DIAGNOSIS — K219 Gastro-esophageal reflux disease without esophagitis: Secondary | ICD-10-CM | POA: Diagnosis not present

## 2023-01-14 DIAGNOSIS — Z87442 Personal history of urinary calculi: Secondary | ICD-10-CM | POA: Diagnosis not present

## 2023-01-14 DIAGNOSIS — F17210 Nicotine dependence, cigarettes, uncomplicated: Secondary | ICD-10-CM | POA: Diagnosis not present

## 2023-01-14 LAB — RAD ONC ARIA SESSION SUMMARY
Course Elapsed Days: 3
Plan Fractions Treated to Date: 4
Plan Prescribed Dose Per Fraction: 1.8 Gy
Plan Total Fractions Prescribed: 25
Plan Total Prescribed Dose: 45 Gy
Reference Point Dosage Given to Date: 7.2 Gy
Reference Point Session Dosage Given: 1.8 Gy
Session Number: 4

## 2023-01-15 ENCOUNTER — Other Ambulatory Visit: Payer: Self-pay

## 2023-01-15 ENCOUNTER — Ambulatory Visit
Admission: RE | Admit: 2023-01-15 | Discharge: 2023-01-15 | Disposition: A | Discharge status: Home / Self Care | Payer: Medicare HMO | Source: Ambulatory Visit | Attending: Radiation Oncology | Admitting: Radiation Oncology

## 2023-01-15 DIAGNOSIS — I10 Essential (primary) hypertension: Secondary | ICD-10-CM | POA: Diagnosis not present

## 2023-01-15 DIAGNOSIS — E1165 Type 2 diabetes mellitus with hyperglycemia: Secondary | ICD-10-CM | POA: Insufficient documentation

## 2023-01-15 DIAGNOSIS — F17210 Nicotine dependence, cigarettes, uncomplicated: Secondary | ICD-10-CM | POA: Insufficient documentation

## 2023-01-15 DIAGNOSIS — Z7902 Long term (current) use of antithrombotics/antiplatelets: Secondary | ICD-10-CM | POA: Diagnosis not present

## 2023-01-15 DIAGNOSIS — E119 Type 2 diabetes mellitus without complications: Secondary | ICD-10-CM | POA: Insufficient documentation

## 2023-01-15 DIAGNOSIS — I2510 Atherosclerotic heart disease of native coronary artery without angina pectoris: Secondary | ICD-10-CM | POA: Diagnosis not present

## 2023-01-15 DIAGNOSIS — Z7984 Long term (current) use of oral hypoglycemic drugs: Secondary | ICD-10-CM | POA: Diagnosis not present

## 2023-01-15 DIAGNOSIS — K219 Gastro-esophageal reflux disease without esophagitis: Secondary | ICD-10-CM | POA: Diagnosis not present

## 2023-01-15 DIAGNOSIS — Z79899 Other long term (current) drug therapy: Secondary | ICD-10-CM | POA: Insufficient documentation

## 2023-01-15 DIAGNOSIS — E785 Hyperlipidemia, unspecified: Secondary | ICD-10-CM | POA: Diagnosis not present

## 2023-01-15 DIAGNOSIS — Z7982 Long term (current) use of aspirin: Secondary | ICD-10-CM | POA: Insufficient documentation

## 2023-01-15 DIAGNOSIS — Z510 Encounter for antineoplastic radiation therapy: Secondary | ICD-10-CM | POA: Insufficient documentation

## 2023-01-15 DIAGNOSIS — D649 Anemia, unspecified: Secondary | ICD-10-CM | POA: Diagnosis not present

## 2023-01-15 DIAGNOSIS — D696 Thrombocytopenia, unspecified: Secondary | ICD-10-CM | POA: Diagnosis not present

## 2023-01-15 DIAGNOSIS — Z87442 Personal history of urinary calculi: Secondary | ICD-10-CM | POA: Insufficient documentation

## 2023-01-15 DIAGNOSIS — C20 Malignant neoplasm of rectum: Secondary | ICD-10-CM | POA: Insufficient documentation

## 2023-01-15 LAB — RAD ONC ARIA SESSION SUMMARY
Course Elapsed Days: 4
Plan Fractions Treated to Date: 5
Plan Prescribed Dose Per Fraction: 1.8 Gy
Plan Total Fractions Prescribed: 25
Plan Total Prescribed Dose: 45 Gy
Reference Point Dosage Given to Date: 9 Gy
Reference Point Session Dosage Given: 1.8 Gy
Session Number: 5

## 2023-01-18 ENCOUNTER — Other Ambulatory Visit: Payer: Self-pay

## 2023-01-18 ENCOUNTER — Ambulatory Visit
Admission: RE | Admit: 2023-01-18 | Discharge: 2023-01-18 | Disposition: A | Discharge status: Home / Self Care | Payer: Medicare HMO | Source: Ambulatory Visit | Attending: Radiation Oncology | Admitting: Radiation Oncology

## 2023-01-18 DIAGNOSIS — I10 Essential (primary) hypertension: Secondary | ICD-10-CM | POA: Diagnosis not present

## 2023-01-18 DIAGNOSIS — F17210 Nicotine dependence, cigarettes, uncomplicated: Secondary | ICD-10-CM | POA: Diagnosis not present

## 2023-01-18 DIAGNOSIS — Z510 Encounter for antineoplastic radiation therapy: Secondary | ICD-10-CM | POA: Diagnosis not present

## 2023-01-18 DIAGNOSIS — D696 Thrombocytopenia, unspecified: Secondary | ICD-10-CM | POA: Diagnosis not present

## 2023-01-18 DIAGNOSIS — C20 Malignant neoplasm of rectum: Secondary | ICD-10-CM | POA: Diagnosis not present

## 2023-01-18 DIAGNOSIS — D649 Anemia, unspecified: Secondary | ICD-10-CM | POA: Diagnosis not present

## 2023-01-18 DIAGNOSIS — I2510 Atherosclerotic heart disease of native coronary artery without angina pectoris: Secondary | ICD-10-CM | POA: Diagnosis not present

## 2023-01-18 DIAGNOSIS — E785 Hyperlipidemia, unspecified: Secondary | ICD-10-CM | POA: Diagnosis not present

## 2023-01-18 DIAGNOSIS — K219 Gastro-esophageal reflux disease without esophagitis: Secondary | ICD-10-CM | POA: Diagnosis not present

## 2023-01-18 LAB — RAD ONC ARIA SESSION SUMMARY
Course Elapsed Days: 7
Plan Fractions Treated to Date: 6
Plan Prescribed Dose Per Fraction: 1.8 Gy
Plan Total Fractions Prescribed: 25
Plan Total Prescribed Dose: 45 Gy
Reference Point Dosage Given to Date: 10.8 Gy
Reference Point Session Dosage Given: 1.8 Gy
Session Number: 6

## 2023-01-19 ENCOUNTER — Other Ambulatory Visit: Payer: Self-pay

## 2023-01-19 ENCOUNTER — Ambulatory Visit
Admission: RE | Admit: 2023-01-19 | Discharge: 2023-01-19 | Disposition: A | Discharge status: Home / Self Care | Payer: Medicare HMO | Source: Ambulatory Visit | Attending: Radiation Oncology | Admitting: Radiation Oncology

## 2023-01-19 DIAGNOSIS — K219 Gastro-esophageal reflux disease without esophagitis: Secondary | ICD-10-CM | POA: Diagnosis not present

## 2023-01-19 DIAGNOSIS — D696 Thrombocytopenia, unspecified: Secondary | ICD-10-CM | POA: Diagnosis not present

## 2023-01-19 DIAGNOSIS — E785 Hyperlipidemia, unspecified: Secondary | ICD-10-CM | POA: Diagnosis not present

## 2023-01-19 DIAGNOSIS — F17210 Nicotine dependence, cigarettes, uncomplicated: Secondary | ICD-10-CM | POA: Diagnosis not present

## 2023-01-19 DIAGNOSIS — I10 Essential (primary) hypertension: Secondary | ICD-10-CM | POA: Diagnosis not present

## 2023-01-19 DIAGNOSIS — I2510 Atherosclerotic heart disease of native coronary artery without angina pectoris: Secondary | ICD-10-CM | POA: Diagnosis not present

## 2023-01-19 DIAGNOSIS — D649 Anemia, unspecified: Secondary | ICD-10-CM | POA: Diagnosis not present

## 2023-01-19 DIAGNOSIS — Z510 Encounter for antineoplastic radiation therapy: Secondary | ICD-10-CM | POA: Diagnosis not present

## 2023-01-19 DIAGNOSIS — C20 Malignant neoplasm of rectum: Secondary | ICD-10-CM | POA: Diagnosis not present

## 2023-01-19 LAB — RAD ONC ARIA SESSION SUMMARY
Course Elapsed Days: 8
Plan Fractions Treated to Date: 7
Plan Prescribed Dose Per Fraction: 1.8 Gy
Plan Total Fractions Prescribed: 25
Plan Total Prescribed Dose: 45 Gy
Reference Point Dosage Given to Date: 12.6 Gy
Reference Point Session Dosage Given: 1.8 Gy
Session Number: 7

## 2023-01-20 ENCOUNTER — Other Ambulatory Visit: Payer: Self-pay

## 2023-01-20 ENCOUNTER — Ambulatory Visit
Admission: RE | Admit: 2023-01-20 | Discharge: 2023-01-20 | Disposition: A | Discharge status: Home / Self Care | Payer: Medicare HMO | Source: Ambulatory Visit | Attending: Radiation Oncology | Admitting: Radiation Oncology

## 2023-01-20 DIAGNOSIS — F17210 Nicotine dependence, cigarettes, uncomplicated: Secondary | ICD-10-CM | POA: Diagnosis not present

## 2023-01-20 DIAGNOSIS — D696 Thrombocytopenia, unspecified: Secondary | ICD-10-CM | POA: Diagnosis not present

## 2023-01-20 DIAGNOSIS — Z510 Encounter for antineoplastic radiation therapy: Secondary | ICD-10-CM | POA: Diagnosis not present

## 2023-01-20 DIAGNOSIS — I2510 Atherosclerotic heart disease of native coronary artery without angina pectoris: Secondary | ICD-10-CM | POA: Diagnosis not present

## 2023-01-20 DIAGNOSIS — K219 Gastro-esophageal reflux disease without esophagitis: Secondary | ICD-10-CM | POA: Diagnosis not present

## 2023-01-20 DIAGNOSIS — E785 Hyperlipidemia, unspecified: Secondary | ICD-10-CM | POA: Diagnosis not present

## 2023-01-20 DIAGNOSIS — D649 Anemia, unspecified: Secondary | ICD-10-CM | POA: Diagnosis not present

## 2023-01-20 DIAGNOSIS — I10 Essential (primary) hypertension: Secondary | ICD-10-CM | POA: Diagnosis not present

## 2023-01-20 DIAGNOSIS — C20 Malignant neoplasm of rectum: Secondary | ICD-10-CM | POA: Diagnosis not present

## 2023-01-20 LAB — RAD ONC ARIA SESSION SUMMARY
Course Elapsed Days: 9
Plan Fractions Treated to Date: 8
Plan Prescribed Dose Per Fraction: 1.8 Gy
Plan Total Fractions Prescribed: 25
Plan Total Prescribed Dose: 45 Gy
Reference Point Dosage Given to Date: 14.4 Gy
Reference Point Session Dosage Given: 1.8 Gy
Session Number: 8

## 2023-01-21 ENCOUNTER — Other Ambulatory Visit: Payer: Self-pay

## 2023-01-21 ENCOUNTER — Ambulatory Visit
Admission: RE | Admit: 2023-01-21 | Discharge: 2023-01-21 | Disposition: A | Discharge status: Home / Self Care | Payer: Medicare HMO | Source: Ambulatory Visit | Attending: Radiation Oncology | Admitting: Radiation Oncology

## 2023-01-21 ENCOUNTER — Other Ambulatory Visit (HOSPITAL_COMMUNITY): Payer: Self-pay

## 2023-01-21 DIAGNOSIS — I10 Essential (primary) hypertension: Secondary | ICD-10-CM | POA: Diagnosis not present

## 2023-01-21 DIAGNOSIS — F17210 Nicotine dependence, cigarettes, uncomplicated: Secondary | ICD-10-CM | POA: Diagnosis not present

## 2023-01-21 DIAGNOSIS — D696 Thrombocytopenia, unspecified: Secondary | ICD-10-CM | POA: Diagnosis not present

## 2023-01-21 DIAGNOSIS — D649 Anemia, unspecified: Secondary | ICD-10-CM | POA: Diagnosis not present

## 2023-01-21 DIAGNOSIS — I2510 Atherosclerotic heart disease of native coronary artery without angina pectoris: Secondary | ICD-10-CM | POA: Diagnosis not present

## 2023-01-21 DIAGNOSIS — Z510 Encounter for antineoplastic radiation therapy: Secondary | ICD-10-CM | POA: Diagnosis not present

## 2023-01-21 DIAGNOSIS — K219 Gastro-esophageal reflux disease without esophagitis: Secondary | ICD-10-CM | POA: Diagnosis not present

## 2023-01-21 DIAGNOSIS — C20 Malignant neoplasm of rectum: Secondary | ICD-10-CM | POA: Diagnosis not present

## 2023-01-21 DIAGNOSIS — E785 Hyperlipidemia, unspecified: Secondary | ICD-10-CM | POA: Diagnosis not present

## 2023-01-21 LAB — RAD ONC ARIA SESSION SUMMARY
Course Elapsed Days: 10
Plan Fractions Treated to Date: 9
Plan Prescribed Dose Per Fraction: 1.8 Gy
Plan Total Fractions Prescribed: 25
Plan Total Prescribed Dose: 45 Gy
Reference Point Dosage Given to Date: 16.2 Gy
Reference Point Session Dosage Given: 1.8 Gy
Session Number: 9

## 2023-01-22 ENCOUNTER — Other Ambulatory Visit: Payer: Self-pay

## 2023-01-22 ENCOUNTER — Ambulatory Visit
Admission: RE | Admit: 2023-01-22 | Discharge: 2023-01-22 | Disposition: A | Discharge status: Home / Self Care | Payer: Medicare HMO | Source: Ambulatory Visit | Attending: Radiation Oncology | Admitting: Radiation Oncology

## 2023-01-22 ENCOUNTER — Other Ambulatory Visit: Payer: Self-pay | Admitting: *Deleted

## 2023-01-22 DIAGNOSIS — Z510 Encounter for antineoplastic radiation therapy: Secondary | ICD-10-CM | POA: Diagnosis not present

## 2023-01-22 DIAGNOSIS — I2510 Atherosclerotic heart disease of native coronary artery without angina pectoris: Secondary | ICD-10-CM | POA: Diagnosis not present

## 2023-01-22 DIAGNOSIS — K219 Gastro-esophageal reflux disease without esophagitis: Secondary | ICD-10-CM | POA: Diagnosis not present

## 2023-01-22 DIAGNOSIS — C20 Malignant neoplasm of rectum: Secondary | ICD-10-CM | POA: Diagnosis not present

## 2023-01-22 DIAGNOSIS — D649 Anemia, unspecified: Secondary | ICD-10-CM | POA: Diagnosis not present

## 2023-01-22 DIAGNOSIS — D696 Thrombocytopenia, unspecified: Secondary | ICD-10-CM | POA: Diagnosis not present

## 2023-01-22 DIAGNOSIS — F17210 Nicotine dependence, cigarettes, uncomplicated: Secondary | ICD-10-CM | POA: Diagnosis not present

## 2023-01-22 DIAGNOSIS — E785 Hyperlipidemia, unspecified: Secondary | ICD-10-CM | POA: Diagnosis not present

## 2023-01-22 DIAGNOSIS — I10 Essential (primary) hypertension: Secondary | ICD-10-CM | POA: Diagnosis not present

## 2023-01-22 LAB — RAD ONC ARIA SESSION SUMMARY
Course Elapsed Days: 11
Plan Fractions Treated to Date: 10
Plan Prescribed Dose Per Fraction: 1.8 Gy
Plan Total Fractions Prescribed: 25
Plan Total Prescribed Dose: 45 Gy
Reference Point Dosage Given to Date: 18 Gy
Reference Point Session Dosage Given: 1.8 Gy
Session Number: 10

## 2023-01-25 ENCOUNTER — Ambulatory Visit
Admission: RE | Admit: 2023-01-25 | Discharge: 2023-01-25 | Disposition: A | Discharge status: Home / Self Care | Payer: Medicare HMO | Source: Ambulatory Visit | Attending: Radiation Oncology | Admitting: Radiation Oncology

## 2023-01-25 ENCOUNTER — Inpatient Hospital Stay (HOSPITAL_BASED_OUTPATIENT_CLINIC_OR_DEPARTMENT_OTHER): Payer: Medicare HMO | Admitting: Oncology

## 2023-01-25 ENCOUNTER — Other Ambulatory Visit: Payer: Self-pay

## 2023-01-25 ENCOUNTER — Inpatient Hospital Stay: Payer: Medicare HMO

## 2023-01-25 ENCOUNTER — Inpatient Hospital Stay: Payer: Medicare HMO | Admitting: Pharmacist

## 2023-01-25 ENCOUNTER — Encounter: Payer: Self-pay | Admitting: Oncology

## 2023-01-25 VITALS — BP 136/81 | HR 90 | Temp 96.9°F | Resp 18 | Ht 72.0 in | Wt 186.9 lb

## 2023-01-25 DIAGNOSIS — F17210 Nicotine dependence, cigarettes, uncomplicated: Secondary | ICD-10-CM | POA: Insufficient documentation

## 2023-01-25 DIAGNOSIS — D696 Thrombocytopenia, unspecified: Secondary | ICD-10-CM | POA: Insufficient documentation

## 2023-01-25 DIAGNOSIS — K219 Gastro-esophageal reflux disease without esophagitis: Secondary | ICD-10-CM | POA: Diagnosis not present

## 2023-01-25 DIAGNOSIS — E1165 Type 2 diabetes mellitus with hyperglycemia: Secondary | ICD-10-CM | POA: Insufficient documentation

## 2023-01-25 DIAGNOSIS — C20 Malignant neoplasm of rectum: Secondary | ICD-10-CM

## 2023-01-25 DIAGNOSIS — E785 Hyperlipidemia, unspecified: Secondary | ICD-10-CM | POA: Diagnosis not present

## 2023-01-25 DIAGNOSIS — Z510 Encounter for antineoplastic radiation therapy: Secondary | ICD-10-CM | POA: Diagnosis not present

## 2023-01-25 DIAGNOSIS — I10 Essential (primary) hypertension: Secondary | ICD-10-CM | POA: Insufficient documentation

## 2023-01-25 DIAGNOSIS — D649 Anemia, unspecified: Secondary | ICD-10-CM | POA: Insufficient documentation

## 2023-01-25 DIAGNOSIS — I2510 Atherosclerotic heart disease of native coronary artery without angina pectoris: Secondary | ICD-10-CM | POA: Diagnosis not present

## 2023-01-25 LAB — CBC WITH DIFFERENTIAL/PLATELET
Abs Immature Granulocytes: 0.03 10*3/uL (ref 0.00–0.07)
Basophils Absolute: 0 10*3/uL (ref 0.0–0.1)
Basophils Relative: 0 %
Eosinophils Absolute: 0.1 10*3/uL (ref 0.0–0.5)
Eosinophils Relative: 2 %
HCT: 33.4 % — ABNORMAL LOW (ref 39.0–52.0)
Hemoglobin: 10.4 g/dL — ABNORMAL LOW (ref 13.0–17.0)
Immature Granulocytes: 0 %
Lymphocytes Relative: 7 %
Lymphs Abs: 0.5 10*3/uL — ABNORMAL LOW (ref 0.7–4.0)
MCH: 29.3 pg (ref 26.0–34.0)
MCHC: 31.1 g/dL (ref 30.0–36.0)
MCV: 94.1 fL (ref 80.0–100.0)
Monocytes Absolute: 0.4 10*3/uL (ref 0.1–1.0)
Monocytes Relative: 6 %
Neutro Abs: 5.9 10*3/uL (ref 1.7–7.7)
Neutrophils Relative %: 85 %
Platelets: 102 10*3/uL — ABNORMAL LOW (ref 150–400)
RBC: 3.55 MIL/uL — ABNORMAL LOW (ref 4.22–5.81)
RDW: 23.1 % — ABNORMAL HIGH (ref 11.5–15.5)
WBC: 7 10*3/uL (ref 4.0–10.5)
nRBC: 0 % (ref 0.0–0.2)

## 2023-01-25 LAB — CMP (CANCER CENTER ONLY)
ALT: 28 U/L (ref 0–44)
AST: 31 U/L (ref 15–41)
Albumin: 4 g/dL (ref 3.5–5.0)
Alkaline Phosphatase: 102 U/L (ref 38–126)
Anion gap: 10 (ref 5–15)
BUN: 17 mg/dL (ref 8–23)
CO2: 25 mmol/L (ref 22–32)
Calcium: 9.1 mg/dL (ref 8.9–10.3)
Chloride: 105 mmol/L (ref 98–111)
Creatinine: 1.03 mg/dL (ref 0.61–1.24)
GFR, Estimated: >60 mL/min (ref >60)
Glucose, Bld: 196 mg/dL — ABNORMAL HIGH (ref 70–99)
Potassium: 3.5 mmol/L (ref 3.5–5.1)
Sodium: 140 mmol/L (ref 135–145)
Total Bilirubin: 0.7 mg/dL (ref 0.3–1.2)
Total Protein: 7.1 g/dL (ref 6.5–8.1)

## 2023-01-25 LAB — RAD ONC ARIA SESSION SUMMARY
Course Elapsed Days: 14
Plan Fractions Treated to Date: 11
Plan Prescribed Dose Per Fraction: 1.8 Gy
Plan Total Fractions Prescribed: 25
Plan Total Prescribed Dose: 45 Gy
Reference Point Dosage Given to Date: 19.8 Gy
Reference Point Session Dosage Given: 1.8 Gy
Session Number: 11

## 2023-01-25 NOTE — Progress Notes (Signed)
[place]  Telephone:([phone] [phone]  Patient Care Team: [facility], [facility] as PCP - General [name], [name] November, MD as PCP - Cardiology (Cardiology) [name], MD as Consulting Physician (Cardiology) [name], RN as Oncology Nurse Navigator Lloyd Huger, MD as Consulting Physician (Oncology)   Name of the patient: [name] [name]  [id number]  1946-05-21   Date of visit: 01/25/23  HPI: Patient is a 77 y.o. male with stage IIa rectal adenocarcinoma. Patient is completing neoadjuvant treatment, he has finished FOLFOX and on 01/11/23 began XRT and capecitabine.   Reason for Consult: Oral chemotherapy follow-up for capecitabine therapy.   PAST MEDICAL HISTORY: Past Medical History:  Diagnosis Date   Anemia    Anginal pain (HCC)    Bradycardia    Cancer (HCC)    Coronary artery disease    x 1 stent   Depression    Diabetes mellitus without complication (HCC)    GERD (gastroesophageal reflux disease)    History of kidney stones    Hyperlipidemia    Hypertension    Nephrolithiasis     HEMATOLOGY/ONCOLOGY HISTORY:  Oncology History  Rectal adenocarcinoma ([place])  08/07/2022 Initial Diagnosis   Rectal adenocarcinoma ([place])   08/13/2022 Cancer Staging   Staging form: Colon and Rectum, AJCC 8th Edition - Clinical stage from 08/13/2022: Stage IIA (cT3, cN0, cM0) - Signed by Lloyd Huger, MD on 08/13/2022 Total positive nodes: 0   08/25/2022 -  Chemotherapy   Patient is on Treatment Plan : COLORECTAL FOLFOX q14d x 4 months       ALLERGIES:  has No Known Allergies.  MEDICATIONS:  Current Outpatient Medications  Medication Sig Dispense Refill   aspirin 81 MG EC tablet Take 81 mg by mouth daily.       atorvastatin (LIPITOR) 40 MG tablet Take 1 tablet (40 mg total) by mouth daily. 90 tablet 3   capecitabine (XELODA) 500 MG tablet Take 3 tablets (1,500 mg total) by mouth 2 (two) times  daily after a meal. Take Monday-Friday. Take only on days of radiation. 168 tablet 0   clopidogrel (PLAVIX) 75 MG tablet Take 75 mg by mouth daily.     cyanocobalamin (VITAMIN B12) 500 MCG tablet Take 500 mcg by mouth daily.     famotidine (PEPCID) 40 MG tablet Take 1 tablet (40 mg total) by mouth daily. 90 tablet 3   glimepiride (AMARYL) 4 MG tablet Take 4 mg by mouth daily with breakfast.     HYDROcodone-acetaminophen (NORCO/VICODIN) 5-325 MG tablet Take 1 tablet by mouth every 6 (six) hours as needed for moderate pain.     ibuprofen (ADVIL) 800 MG tablet Take 1 tablet (800 mg total) by mouth every 8 (eight) hours as needed. 30 tablet 0   isosorbide mononitrate (IMDUR) 30 MG 24 hr tablet Take 1 tablet (30 mg total) by mouth daily. 90 tablet 3   lidocaine-prilocaine (EMLA) cream Apply to affected area once 30 g 3   metFORMIN (GLUCOPHAGE) 1000 MG tablet Take 1,000 mg by mouth 2 (two) times daily with a meal.     nitroGLYCERIN (NITROSTAT) 0.4 MG SL tablet Place 1 tablet (0.4 mg total) under the tongue every 5 (five) minutes as needed for chest pain. 25 tablet 6   ondansetron (ZOFRAN) 8 MG tablet Take 1 tablet (8 mg total) by mouth every 8 (eight) hours as needed for nausea or vomiting. 60 tablet 2   pioglitazone (ACTOS) 15 MG tablet Take  15 mg by mouth daily.     prochlorperazine (COMPAZINE) 10 MG tablet Take 1 tablet (10 mg total) by mouth every 6 (six) hours as needed for nausea or vomiting. 60 tablet 2   No current facility-administered medications for this visit.    VITAL SIGNS: There were no vitals taken for this visit. There were no vitals filed for this visit.  Estimated body mass index is 25.35 kg/m as calculated from the following:   Height as of an earlier encounter on 01/25/23: 6' (1.829 m).   Weight as of an earlier encounter on 01/25/23: 84.8 kg (186 lb 14.4 oz).  LABS: CBC:    Component Value Date/Time   WBC 7.0 01/25/2023 1033   HGB 10.4 (L) 01/25/2023 1033   HGB 15.3  12/20/2014 1258   HCT 33.4 (L) 01/25/2023 1033   HCT 46.1 12/20/2014 1258   PLT 102 (L) 01/25/2023 1033   PLT 163 12/20/2014 1258   MCV 94.1 01/25/2023 1033   MCV 92 12/20/2014 1258   NEUTROABS 5.9 01/25/2023 1033   NEUTROABS 7.3 (H) 12/20/2014 1258   LYMPHSABS 0.5 (L) 01/25/2023 1033   LYMPHSABS 1.6 12/20/2014 1258   MONOABS 0.4 01/25/2023 1033   MONOABS 0.8 12/20/2014 1258   EOSABS 0.1 01/25/2023 1033   EOSABS 0.1 12/20/2014 1258   BASOSABS 0.0 01/25/2023 1033   BASOSABS 0.1 12/20/2014 1258   Comprehensive Metabolic Panel:    Component Value Date/Time   NA 140 01/25/2023 1033   NA 135 (L) 12/20/2014 1419   K 3.5 01/25/2023 1033   K 4.5 12/20/2014 1419   CL 105 01/25/2023 1033   CL 100 12/20/2014 1419   CO2 25 01/25/2023 1033   CO2 19 (L) 12/20/2014 1419   BUN 17 01/25/2023 1033   BUN 16 12/20/2014 1419   CREATININE 1.03 01/25/2023 1033   CREATININE 1.05 12/20/2014 1419   GLUCOSE 196 (H) 01/25/2023 1033   GLUCOSE 290 (H) 12/20/2014 1419   CALCIUM 9.1 01/25/2023 1033   CALCIUM 8.6 12/20/2014 1419   AST 31 01/25/2023 1033   ALT 28 01/25/2023 1033   ALKPHOS 102 01/25/2023 1033   BILITOT 0.7 01/25/2023 1033   PROT 7.1 01/25/2023 1033   PROT 6.8 10/19/2017 1139   ALBUMIN 4.0 01/25/2023 1033   ALBUMIN 4.6 10/19/2017 1139     Present during today's visit: Patient only  Assessment and Plan: CMP/CBC reviewed, continue capecitabine '1500mg'$  twice daily on days of radiation   Oral Chemotherapy Side Effect/Intolerance:  Diarrhea: patient had some diarrhea over the weekend, but has resolved today. Patient knows to begin taking loperamide if the diarrhea resumes No reported hand-foot syndrome, fatigue, mouth sores  Oral Chemotherapy Adherence: no missed doses reported No patient barriers to medication adherence identified.   New medications: None reported  Medication Access Issues: No issues, patient has enough capecitabine on hand to finish out radiation.   Patient  expressed understanding and was in agreement with this plan. He also understands that He can call clinic at any time with any questions, concerns, or complaints.    Thank you for allowing me to participate in the care of this very pleasant patient.   Time Total: 15 mins  Visit consisted of counseling and education on dealing with issues of symptom management in the setting of serious and potentially life-threatening illness.Greater than 50%  of this time was spent counseling and coordinating care related to the above assessment and plan.  Signed by: Darl Pikes, PharmD, BCPS, BCOP, CPP Hematology/Oncology  Clinical Pharmacist Practitioner [place]/DB/AP Oral [facility] [phone]  01/25/2023 12:58 PM

## 2023-01-25 NOTE — Progress Notes (Signed)
Patient states that over the weekend and he had some stomach ain this weekend with runny stools.He states that the problem has resolved.

## 2023-01-25 NOTE — Progress Notes (Signed)
[place]  Telephone:([phone]) [phone] Fax:(336) [phone]  ID: [name] [name] OB: 24-Aug-1946  MR#: [id number]  [id number]  Patient Care Team: [facility] as PCP - General [name], [name] November, MD as PCP - Cardiology (Cardiology) [name], MD as Consulting Physician (Cardiology) [name], RN as Oncology Nurse Navigator Grayland Ormond, [name] November, MD as Consulting Physician (Oncology)  CHIEF COMPLAINT: Stage IIa rectal adenocarcinoma.  INTERVAL HISTORY: Patient returns to clinic today for further evaluation, and continuation of Xeloda and XRT.  He is tolerating his treatments well without significant side effects.  He currently feels well and is asymptomatic.  He has no neurologic complaints.  He denies any recent fevers or illnesses.  He has no chest pain, shortness of breath, cough, or hemoptysis.  He denies any nausea, vomiting, constipation, or diarrhea.  He does not report any melena or hematochezia.  He has no urinary complaints.  Patient offers no specific complaints today.  REVIEW OF SYSTEMS:   Review of Systems  Constitutional: Negative.  Negative for fever, malaise/fatigue and weight loss.  Respiratory: Negative.  Negative for cough, hemoptysis and shortness of breath.   Cardiovascular: Negative.  Negative for chest pain and leg swelling.  Gastrointestinal: Negative.  Negative for abdominal pain.  Genitourinary: Negative.  Negative for dysuria.  Musculoskeletal: Negative.  Negative for back pain.  Skin: Negative.  Negative for rash.  Neurological: Negative.  Negative for dizziness, focal weakness, weakness and headaches.  Psychiatric/Behavioral: Negative.  The patient is not nervous/anxious.     As per HPI. Otherwise, a complete review of systems is negative.  PAST MEDICAL HISTORY: Past Medical History:  Diagnosis Date   Anemia    Anginal pain (HCC)    Bradycardia    Cancer (HCC)    Coronary artery disease    x 1  stent   Depression    Diabetes mellitus without complication (HCC)    GERD (gastroesophageal reflux disease)    History of kidney stones    Hyperlipidemia    Hypertension    Nephrolithiasis     PAST SURGICAL HISTORY: Past Surgical History:  Procedure Laterality Date   CARDIAC CATHETERIZATION     CATARACT EXTRACTION     CORONARY ANGIOPLASTY     FLEXIBLE SIGMOIDOSCOPY N/A 07/27/2022   Procedure: FLEXIBLE SIGMOIDOSCOPY;  Surgeon: Lesly Rubenstein, MD;  Location: [facility];  Service: Endoscopy;  Laterality: N/A;   KIDNEY STONE SURGERY     NECK SURGERY  12/18/2011   nephrolithiasis     PORTACATH PLACEMENT Right 08/24/2022   Procedure: INSERTION PORT-A-CATH;  Surgeon: Ronny Bacon, MD;  Location: [facility] ORS;  Service: General;  Laterality: Right;   stent (other)     TRANSVERSE LOOP COLOSTOMY N/A 07/28/2022   Procedure: TRANSVERSE LOOP COLOSTOMY;  Surgeon: Ronny Bacon, MD;  Location: [facility] OR[facility];  Service: General;  Laterality: N/A;    FAMILY HISTORY: Family History  Problem Relation Age of Onset   Heart disease Father     ADVANCED DIRECTIVES (Y/N):  N  HEALTH MAINTENANCE: Social History   Tobacco Use   Smoking status: Every Day    Packs/day: 0.50    Years: 36.00    Total pack years: 18.00    Types: Cigarettes   Smokeless tobacco: Never  Vaping Use   Vaping Use: Never used  Substance Use Topics   Alcohol use: No   Drug use: No     Colonoscopy:  PAP:  Bone density:  Lipid panel:  No Known Allergies  Current Outpatient Medications  Medication Sig Dispense Refill   aspirin 81 MG EC tablet Take 81 mg by mouth daily.       atorvastatin (LIPITOR) 40 MG tablet Take 1 tablet (40 mg total) by mouth daily. 90 tablet 3   capecitabine (XELODA) 500 MG tablet Take 3 tablets (1,500 mg total) by mouth 2 (two) times daily after a meal. Take Monday-Friday. Take only on days of radiation. 168 tablet 0   clopidogrel (PLAVIX) 75 MG tablet Take 75 mg by mouth daily.      cyanocobalamin (VITAMIN B12) 500 MCG tablet Take 500 mcg by mouth daily.     famotidine (PEPCID) 40 MG tablet Take 1 tablet (40 mg total) by mouth daily. 90 tablet 3   glimepiride (AMARYL) 4 MG tablet Take 4 mg by mouth daily with breakfast.     HYDROcodone-acetaminophen (NORCO/VICODIN) 5-325 MG tablet Take 1 tablet by mouth every 6 (six) hours as needed for moderate pain.     ibuprofen (ADVIL) 800 MG tablet Take 1 tablet (800 mg total) by mouth every 8 (eight) hours as needed. 30 tablet 0   isosorbide mononitrate (IMDUR) 30 MG 24 hr tablet Take 1 tablet (30 mg total) by mouth daily. 90 tablet 3   lidocaine-prilocaine (EMLA) cream Apply to affected area once 30 g 3   metFORMIN (GLUCOPHAGE) 1000 MG tablet Take 1,000 mg by mouth 2 (two) times daily with a meal.     nitroGLYCERIN (NITROSTAT) 0.4 MG SL tablet Place 1 tablet (0.4 mg total) under the tongue every 5 (five) minutes as needed for chest pain. 25 tablet 6   ondansetron (ZOFRAN) 8 MG tablet Take 1 tablet (8 mg total) by mouth every 8 (eight) hours as needed for nausea or vomiting. 60 tablet 2   pioglitazone (ACTOS) 15 MG tablet Take 15 mg by mouth daily.     prochlorperazine (COMPAZINE) 10 MG tablet Take 1 tablet (10 mg total) by mouth every 6 (six) hours as needed for nausea or vomiting. 60 tablet 2   No current facility-administered medications for this visit.    OBJECTIVE: Vitals:   01/25/23 1044  BP: 136/81  Pulse: 90  Resp: 18  Temp: (!) 96.9 F (36.1 C)  SpO2: 99%      Body mass index is 25.35 kg/m.    ECOG FS:0 - Asymptomatic  General: Well-developed, well-nourished, no acute distress. Eyes: Pink conjunctiva, anicteric sclera. HEENT: Normocephalic, moist mucous membranes. Lungs: No audible wheezing or coughing. Heart: Regular rate and rhythm. Abdomen: Soft, nontender, no obvious distention. Musculoskeletal: No edema, cyanosis, or clubbing. Neuro: Alert, answering all questions appropriately. Cranial nerves grossly  intact. Skin: No rashes or petechiae noted. Psych: Normal affect.  LAB RESULTS:  Lab Results  Component Value Date   NA 136 12/30/2022   K 3.5 12/30/2022   CL 101 12/30/2022   CO2 23 12/30/2022   GLUCOSE 249 (H) 12/30/2022   BUN 18 12/30/2022   CREATININE 0.82 12/30/2022   CALCIUM 8.8 (L) 12/30/2022   PROT 6.6 12/30/2022   ALBUMIN 3.7 12/30/2022   AST 30 12/30/2022   ALT 26 12/30/2022   ALKPHOS 113 12/30/2022   BILITOT 0.5 12/30/2022   GFRNONAA >60 12/30/2022   GFRAA >60 12/20/2014    Lab Results  Component Value Date   WBC 7.0 01/25/2023   NEUTROABS 5.9 01/25/2023   HGB 10.4 (L) 01/25/2023   HCT 33.4 (L) 01/25/2023   MCV 94.1 01/25/2023   PLT 102 (L) 01/25/2023  STUDIES: No results found.  ASSESSMENT: Stage IIa rectal adenocarcinoma.  PLAN:    Stage IIa rectal adenocarcinoma: Diagnosis confirmed by imaging and biopsy.  MRI and CT scan results reviewed independently and reported as above confirming stage of disease.  Patient has now completed neoadjuvant FOLFOX receiving his final dose on December 30, 2022.  Continue Xeloda and daily XRT, completing treatment on February 17, 2023.  A referral has been made for surgery.  Return to clinic in approximately 3 weeks near the end of his treatments for repeat laboratory work and further evaluation.  Appreciate clinical pharmacy input.   Anemia: Hemoglobin has improved to 10.4. Thrombocytopenia: Platelets have trended down to 102, proceed with treatment as above. Hyperglycemia: Patient's blood glucose remains elevated, but mildly improved.  Continue monitoring and treatment per primary care. Hypokalemia: Resolved.   Patient expressed understanding and was in agreement with this plan. He also understands that He can call clinic at any time with any questions, concerns, or complaints.    Cancer Staging  Rectal adenocarcinoma [facility]) Staging form: Colon and Rectum, AJCC 8th Edition - Clinical stage from 08/13/2022: Stage IIA  (cT3, cN0, cM0) - Signed by Lloyd Huger, MD on 08/13/2022 Total positive nodes: 0  Lloyd Huger, MD   01/25/2023 10:58 AM

## 2023-01-25 NOTE — Progress Notes (Signed)
Referral sent to [facility] 

## 2023-01-26 ENCOUNTER — Ambulatory Visit
Admission: RE | Admit: 2023-01-26 | Discharge: 2023-01-26 | Disposition: A | Discharge status: Home / Self Care | Payer: Medicare HMO | Source: Ambulatory Visit | Attending: Radiation Oncology | Admitting: Radiation Oncology

## 2023-01-26 ENCOUNTER — Other Ambulatory Visit: Payer: Self-pay

## 2023-01-26 DIAGNOSIS — D696 Thrombocytopenia, unspecified: Secondary | ICD-10-CM | POA: Diagnosis not present

## 2023-01-26 DIAGNOSIS — K219 Gastro-esophageal reflux disease without esophagitis: Secondary | ICD-10-CM | POA: Diagnosis not present

## 2023-01-26 DIAGNOSIS — C20 Malignant neoplasm of rectum: Secondary | ICD-10-CM | POA: Diagnosis not present

## 2023-01-26 DIAGNOSIS — Z510 Encounter for antineoplastic radiation therapy: Secondary | ICD-10-CM | POA: Diagnosis not present

## 2023-01-26 DIAGNOSIS — I10 Essential (primary) hypertension: Secondary | ICD-10-CM | POA: Diagnosis not present

## 2023-01-26 DIAGNOSIS — D649 Anemia, unspecified: Secondary | ICD-10-CM | POA: Diagnosis not present

## 2023-01-26 DIAGNOSIS — E785 Hyperlipidemia, unspecified: Secondary | ICD-10-CM | POA: Diagnosis not present

## 2023-01-26 DIAGNOSIS — I2510 Atherosclerotic heart disease of native coronary artery without angina pectoris: Secondary | ICD-10-CM | POA: Diagnosis not present

## 2023-01-26 DIAGNOSIS — F17210 Nicotine dependence, cigarettes, uncomplicated: Secondary | ICD-10-CM | POA: Diagnosis not present

## 2023-01-26 LAB — RAD ONC ARIA SESSION SUMMARY
Course Elapsed Days: 15
Plan Fractions Treated to Date: 12
Plan Prescribed Dose Per Fraction: 1.8 Gy
Plan Total Fractions Prescribed: 25
Plan Total Prescribed Dose: 45 Gy
Reference Point Dosage Given to Date: 21.6 Gy
Reference Point Session Dosage Given: 1.8 Gy
Session Number: 12

## 2023-01-27 ENCOUNTER — Other Ambulatory Visit: Payer: Self-pay

## 2023-01-27 ENCOUNTER — Ambulatory Visit
Admission: RE | Admit: 2023-01-27 | Discharge: 2023-01-27 | Disposition: A | Discharge status: Home / Self Care | Payer: Medicare HMO | Source: Ambulatory Visit | Attending: Radiation Oncology | Admitting: Radiation Oncology

## 2023-01-27 DIAGNOSIS — Z510 Encounter for antineoplastic radiation therapy: Secondary | ICD-10-CM | POA: Diagnosis not present

## 2023-01-27 DIAGNOSIS — C20 Malignant neoplasm of rectum: Secondary | ICD-10-CM | POA: Diagnosis not present

## 2023-01-27 DIAGNOSIS — I2510 Atherosclerotic heart disease of native coronary artery without angina pectoris: Secondary | ICD-10-CM | POA: Diagnosis not present

## 2023-01-27 DIAGNOSIS — D649 Anemia, unspecified: Secondary | ICD-10-CM | POA: Diagnosis not present

## 2023-01-27 DIAGNOSIS — K219 Gastro-esophageal reflux disease without esophagitis: Secondary | ICD-10-CM | POA: Diagnosis not present

## 2023-01-27 DIAGNOSIS — F17210 Nicotine dependence, cigarettes, uncomplicated: Secondary | ICD-10-CM | POA: Diagnosis not present

## 2023-01-27 DIAGNOSIS — I10 Essential (primary) hypertension: Secondary | ICD-10-CM | POA: Diagnosis not present

## 2023-01-27 DIAGNOSIS — D696 Thrombocytopenia, unspecified: Secondary | ICD-10-CM | POA: Diagnosis not present

## 2023-01-27 DIAGNOSIS — E785 Hyperlipidemia, unspecified: Secondary | ICD-10-CM | POA: Diagnosis not present

## 2023-01-27 LAB — RAD ONC ARIA SESSION SUMMARY
Course Elapsed Days: 16
Plan Fractions Treated to Date: 13
Plan Prescribed Dose Per Fraction: 1.8 Gy
Plan Total Fractions Prescribed: 25
Plan Total Prescribed Dose: 45 Gy
Reference Point Dosage Given to Date: 23.4 Gy
Reference Point Session Dosage Given: 1.8 Gy
Session Number: 13

## 2023-01-28 ENCOUNTER — Other Ambulatory Visit: Payer: Self-pay

## 2023-01-28 ENCOUNTER — Ambulatory Visit
Admission: RE | Admit: 2023-01-28 | Discharge: 2023-01-28 | Disposition: A | Discharge status: Home / Self Care | Payer: Medicare HMO | Source: Ambulatory Visit | Attending: Radiation Oncology | Admitting: Radiation Oncology

## 2023-01-28 DIAGNOSIS — F17210 Nicotine dependence, cigarettes, uncomplicated: Secondary | ICD-10-CM | POA: Diagnosis not present

## 2023-01-28 DIAGNOSIS — E785 Hyperlipidemia, unspecified: Secondary | ICD-10-CM | POA: Diagnosis not present

## 2023-01-28 DIAGNOSIS — D696 Thrombocytopenia, unspecified: Secondary | ICD-10-CM | POA: Diagnosis not present

## 2023-01-28 DIAGNOSIS — I2510 Atherosclerotic heart disease of native coronary artery without angina pectoris: Secondary | ICD-10-CM | POA: Diagnosis not present

## 2023-01-28 DIAGNOSIS — C20 Malignant neoplasm of rectum: Secondary | ICD-10-CM | POA: Diagnosis not present

## 2023-01-28 DIAGNOSIS — Z510 Encounter for antineoplastic radiation therapy: Secondary | ICD-10-CM | POA: Diagnosis not present

## 2023-01-28 DIAGNOSIS — D649 Anemia, unspecified: Secondary | ICD-10-CM | POA: Diagnosis not present

## 2023-01-28 DIAGNOSIS — K219 Gastro-esophageal reflux disease without esophagitis: Secondary | ICD-10-CM | POA: Diagnosis not present

## 2023-01-28 DIAGNOSIS — I10 Essential (primary) hypertension: Secondary | ICD-10-CM | POA: Diagnosis not present

## 2023-01-28 LAB — RAD ONC ARIA SESSION SUMMARY
Course Elapsed Days: 17
Plan Fractions Treated to Date: 14
Plan Prescribed Dose Per Fraction: 1.8 Gy
Plan Total Fractions Prescribed: 25
Plan Total Prescribed Dose: 45 Gy
Reference Point Dosage Given to Date: 25.2 Gy
Reference Point Session Dosage Given: 1.8 Gy
Session Number: 14

## 2023-01-29 ENCOUNTER — Other Ambulatory Visit: Payer: Self-pay

## 2023-01-29 ENCOUNTER — Ambulatory Visit
Admission: RE | Admit: 2023-01-29 | Discharge: 2023-01-29 | Disposition: A | Discharge status: Home / Self Care | Payer: Medicare HMO | Source: Ambulatory Visit | Attending: Radiation Oncology | Admitting: Radiation Oncology

## 2023-01-29 DIAGNOSIS — Z510 Encounter for antineoplastic radiation therapy: Secondary | ICD-10-CM | POA: Diagnosis not present

## 2023-01-29 DIAGNOSIS — C20 Malignant neoplasm of rectum: Secondary | ICD-10-CM | POA: Diagnosis not present

## 2023-01-29 DIAGNOSIS — E785 Hyperlipidemia, unspecified: Secondary | ICD-10-CM | POA: Diagnosis not present

## 2023-01-29 DIAGNOSIS — D649 Anemia, unspecified: Secondary | ICD-10-CM | POA: Diagnosis not present

## 2023-01-29 DIAGNOSIS — F17210 Nicotine dependence, cigarettes, uncomplicated: Secondary | ICD-10-CM | POA: Diagnosis not present

## 2023-01-29 DIAGNOSIS — D696 Thrombocytopenia, unspecified: Secondary | ICD-10-CM | POA: Diagnosis not present

## 2023-01-29 DIAGNOSIS — K219 Gastro-esophageal reflux disease without esophagitis: Secondary | ICD-10-CM | POA: Diagnosis not present

## 2023-01-29 DIAGNOSIS — I10 Essential (primary) hypertension: Secondary | ICD-10-CM | POA: Diagnosis not present

## 2023-01-29 DIAGNOSIS — I2510 Atherosclerotic heart disease of native coronary artery without angina pectoris: Secondary | ICD-10-CM | POA: Diagnosis not present

## 2023-01-29 LAB — RAD ONC ARIA SESSION SUMMARY
Course Elapsed Days: 18
Plan Fractions Treated to Date: 15
Plan Prescribed Dose Per Fraction: 1.8 Gy
Plan Total Fractions Prescribed: 25
Plan Total Prescribed Dose: 45 Gy
Reference Point Dosage Given to Date: 27 Gy
Reference Point Session Dosage Given: 1.8 Gy
Session Number: 15

## 2023-01-29 NOTE — Progress Notes (Signed)
[facility] documented that they were unable to contact for appointment and letter was sent. Met with [name] [name] in radiation and provided copy of the letter and the phone number to call and arrange appointment. Encouraged him to call today or Monday.

## 2023-02-01 ENCOUNTER — Other Ambulatory Visit: Payer: Self-pay

## 2023-02-01 ENCOUNTER — Ambulatory Visit
Admission: RE | Admit: 2023-02-01 | Discharge: 2023-02-01 | Disposition: A | Discharge status: Home / Self Care | Payer: Medicare HMO | Source: Ambulatory Visit | Attending: Radiation Oncology | Admitting: Radiation Oncology

## 2023-02-01 DIAGNOSIS — F17210 Nicotine dependence, cigarettes, uncomplicated: Secondary | ICD-10-CM | POA: Diagnosis not present

## 2023-02-01 DIAGNOSIS — I2510 Atherosclerotic heart disease of native coronary artery without angina pectoris: Secondary | ICD-10-CM | POA: Diagnosis not present

## 2023-02-01 DIAGNOSIS — E785 Hyperlipidemia, unspecified: Secondary | ICD-10-CM | POA: Diagnosis not present

## 2023-02-01 DIAGNOSIS — Z510 Encounter for antineoplastic radiation therapy: Secondary | ICD-10-CM | POA: Diagnosis not present

## 2023-02-01 DIAGNOSIS — D696 Thrombocytopenia, unspecified: Secondary | ICD-10-CM | POA: Diagnosis not present

## 2023-02-01 DIAGNOSIS — I10 Essential (primary) hypertension: Secondary | ICD-10-CM | POA: Diagnosis not present

## 2023-02-01 DIAGNOSIS — C20 Malignant neoplasm of rectum: Secondary | ICD-10-CM | POA: Diagnosis not present

## 2023-02-01 DIAGNOSIS — K219 Gastro-esophageal reflux disease without esophagitis: Secondary | ICD-10-CM | POA: Diagnosis not present

## 2023-02-01 DIAGNOSIS — D649 Anemia, unspecified: Secondary | ICD-10-CM | POA: Diagnosis not present

## 2023-02-01 LAB — RAD ONC ARIA SESSION SUMMARY
Course Elapsed Days: 21
Plan Fractions Treated to Date: 16
Plan Prescribed Dose Per Fraction: 1.8 Gy
Plan Total Fractions Prescribed: 25
Plan Total Prescribed Dose: 45 Gy
Reference Point Dosage Given to Date: 28.8 Gy
Reference Point Session Dosage Given: 1.8 Gy
Session Number: 16

## 2023-02-02 ENCOUNTER — Other Ambulatory Visit: Payer: Self-pay

## 2023-02-02 ENCOUNTER — Encounter: Payer: Self-pay | Admitting: Oncology

## 2023-02-02 ENCOUNTER — Ambulatory Visit
Admission: RE | Admit: 2023-02-02 | Discharge: 2023-02-02 | Disposition: A | Discharge status: Home / Self Care | Payer: Medicare HMO | Source: Ambulatory Visit | Attending: Radiation Oncology | Admitting: Radiation Oncology

## 2023-02-02 DIAGNOSIS — I10 Essential (primary) hypertension: Secondary | ICD-10-CM | POA: Diagnosis not present

## 2023-02-02 DIAGNOSIS — D649 Anemia, unspecified: Secondary | ICD-10-CM | POA: Diagnosis not present

## 2023-02-02 DIAGNOSIS — K219 Gastro-esophageal reflux disease without esophagitis: Secondary | ICD-10-CM | POA: Diagnosis not present

## 2023-02-02 DIAGNOSIS — E785 Hyperlipidemia, unspecified: Secondary | ICD-10-CM | POA: Diagnosis not present

## 2023-02-02 DIAGNOSIS — F17210 Nicotine dependence, cigarettes, uncomplicated: Secondary | ICD-10-CM | POA: Diagnosis not present

## 2023-02-02 DIAGNOSIS — D696 Thrombocytopenia, unspecified: Secondary | ICD-10-CM | POA: Diagnosis not present

## 2023-02-02 DIAGNOSIS — I2510 Atherosclerotic heart disease of native coronary artery without angina pectoris: Secondary | ICD-10-CM | POA: Diagnosis not present

## 2023-02-02 DIAGNOSIS — C20 Malignant neoplasm of rectum: Secondary | ICD-10-CM | POA: Diagnosis not present

## 2023-02-02 DIAGNOSIS — Z510 Encounter for antineoplastic radiation therapy: Secondary | ICD-10-CM | POA: Diagnosis not present

## 2023-02-02 LAB — RAD ONC ARIA SESSION SUMMARY
Course Elapsed Days: 22
Plan Fractions Treated to Date: 17
Plan Prescribed Dose Per Fraction: 1.8 Gy
Plan Total Fractions Prescribed: 25
Plan Total Prescribed Dose: 45 Gy
Reference Point Dosage Given to Date: 30.6 Gy
Reference Point Session Dosage Given: 1.8 Gy
Session Number: 17

## 2023-02-03 ENCOUNTER — Other Ambulatory Visit: Payer: Self-pay

## 2023-02-03 ENCOUNTER — Ambulatory Visit
Admission: RE | Admit: 2023-02-03 | Discharge: 2023-02-03 | Disposition: A | Discharge status: Home / Self Care | Payer: Medicare HMO | Source: Ambulatory Visit | Attending: Radiation Oncology | Admitting: Radiation Oncology

## 2023-02-03 ENCOUNTER — Encounter: Payer: Self-pay | Admitting: Family

## 2023-02-03 ENCOUNTER — Ambulatory Visit: Payer: Medicare HMO | Admitting: Family

## 2023-02-03 VITALS — BP 116/66 | HR 93 | Ht 72.0 in | Wt 184.0 lb

## 2023-02-03 DIAGNOSIS — E559 Vitamin D deficiency, unspecified: Secondary | ICD-10-CM | POA: Diagnosis not present

## 2023-02-03 DIAGNOSIS — D649 Anemia, unspecified: Secondary | ICD-10-CM | POA: Diagnosis not present

## 2023-02-03 DIAGNOSIS — I2510 Atherosclerotic heart disease of native coronary artery without angina pectoris: Secondary | ICD-10-CM | POA: Diagnosis not present

## 2023-02-03 DIAGNOSIS — F17210 Nicotine dependence, cigarettes, uncomplicated: Secondary | ICD-10-CM | POA: Diagnosis not present

## 2023-02-03 DIAGNOSIS — R7303 Prediabetes: Secondary | ICD-10-CM | POA: Diagnosis not present

## 2023-02-03 DIAGNOSIS — E782 Mixed hyperlipidemia: Secondary | ICD-10-CM | POA: Diagnosis not present

## 2023-02-03 DIAGNOSIS — I10 Essential (primary) hypertension: Secondary | ICD-10-CM

## 2023-02-03 DIAGNOSIS — C20 Malignant neoplasm of rectum: Secondary | ICD-10-CM | POA: Diagnosis not present

## 2023-02-03 DIAGNOSIS — Z510 Encounter for antineoplastic radiation therapy: Secondary | ICD-10-CM | POA: Diagnosis not present

## 2023-02-03 DIAGNOSIS — K219 Gastro-esophageal reflux disease without esophagitis: Secondary | ICD-10-CM | POA: Diagnosis not present

## 2023-02-03 DIAGNOSIS — E785 Hyperlipidemia, unspecified: Secondary | ICD-10-CM | POA: Diagnosis not present

## 2023-02-03 DIAGNOSIS — D696 Thrombocytopenia, unspecified: Secondary | ICD-10-CM | POA: Diagnosis not present

## 2023-02-03 LAB — RAD ONC ARIA SESSION SUMMARY
Course Elapsed Days: 23
Plan Fractions Treated to Date: 18
Plan Prescribed Dose Per Fraction: 1.8 Gy
Plan Total Fractions Prescribed: 25
Plan Total Prescribed Dose: 45 Gy
Reference Point Dosage Given to Date: 32.4 Gy
Reference Point Session Dosage Given: 1.8 Gy
Session Number: 18

## 2023-02-03 NOTE — Progress Notes (Signed)
Established Patient Office Visit  Subjective:  Patient ID: [name] [name], male    DOB: 03/23/1946  Age: 77 y.o. MRN: [id number]  Chief Complaint  Patient presents with   Follow-up    6 month follow up    Patient is here today for his 6 months follow up.  He has been feeling fairly well since last appointment.   He does not have additional concerns to discuss today.  Has been followed and is being treated extensively by oncology given his colon cancer.   Labs are due today. He does not need refills.   I have reviewed his active problem list, medication list, allergies, notes from last encounter, lab results, imaging for his appointment today.   No other concerns at this time.   Past Medical History:  Diagnosis Date   Anemia    Anginal pain (HCC)    Bradycardia    Cancer (HCC)    Coronary artery disease    x 1 stent   Depression    Diabetes mellitus without complication (HCC)    GERD (gastroesophageal reflux disease)    History of kidney stones    Hyperlipidemia    Hypertension    Nephrolithiasis     Past Surgical History:  Procedure Laterality Date   CARDIAC CATHETERIZATION     CATARACT EXTRACTION     CORONARY ANGIOPLASTY     FLEXIBLE SIGMOIDOSCOPY N/A 07/27/2022   Procedure: FLEXIBLE SIGMOIDOSCOPY;  Surgeon: Lesly Rubenstein, MD;  Location: [facility];  Service: Endoscopy;  Laterality: N/A;   KIDNEY STONE SURGERY     NECK SURGERY  12/18/2011   nephrolithiasis     PORTACATH PLACEMENT Right 08/24/2022   Procedure: INSERTION PORT-A-CATH;  Surgeon: Ronny Bacon, MD;  Location: [facility] OR[facility];  Service: General;  Laterality: Right;   stent (other)     TRANSVERSE LOOP COLOSTOMY N/A 07/28/2022   Procedure: TRANSVERSE LOOP COLOSTOMY;  Surgeon: Ronny Bacon, MD;  Location: [facility] ORS;  Service: General;  Laterality: N/A;    Social History   Socioeconomic History   Marital status: Married    Spouse name: Doris   Number of children: Not on file    Years of education: Not on file   Highest education level: Not on file  Occupational History   Not on file  Tobacco Use   Smoking status: Every Day    Packs/day: 0.50    Years: 36.00    Additional pack years: 0.00    Total pack years: 18.00    Types: Cigarettes   Smokeless tobacco: Never  Vaping Use   Vaping Use: Never used  Substance and Sexual Activity   Alcohol use: No   Drug use: No   Sexual activity: Not on file  Other Topics Concern   Not on file  Social History Narrative   Not on file   Social Determinants of Health   Financial Resource Strain: Not on file  Food Insecurity: No Food Insecurity (07/27/2022)   Hunger Vital Sign    Worried About Running Out of Food in the Last Year: Never true    Ran Out of Food in the Last Year: Never true  Transportation Needs: No Transportation Needs (07/27/2022)   PRAPARE - Hydrologist (Medical): No    Lack of Transportation (Non-Medical): No  Physical Activity: Not on file  Stress: Not on file  Social Connections: Not on file  Intimate Partner Violence: Not At Risk (07/27/2022)   Humiliation, Afraid, Rape, and  Kick questionnaire    Fear of Current or Ex-Partner: No    Emotionally Abused: No    Physically Abused: No    Sexually Abused: No    Family History  Problem Relation Age of Onset   Heart disease Father     No Known Allergies  Review of Systems  All other systems reviewed and are negative.      Objective:   BP 116/66   Pulse 93   Ht 6' (1.829 m)   Wt 184 lb (83.5 kg)   SpO2 96%   BMI 24.95 kg/m   Vitals:   02/03/23 0957  BP: 116/66  Pulse: 93  Height: 6' (1.829 m)  Weight: 184 lb (83.5 kg)  SpO2: 96%  BMI (Calculated): 24.95    Physical Exam Vitals and nursing note reviewed.  Constitutional:      Appearance: Normal appearance. He is normal weight.  Eyes:     Extraocular Movements: Extraocular movements intact.     Conjunctiva/sclera: Conjunctivae normal.      Pupils: Pupils are equal, round, and reactive to light.  Cardiovascular:     Rate and Rhythm: Normal rate and regular rhythm.     Pulses: Normal pulses.     Heart sounds: Normal heart sounds.  Pulmonary:     Effort: Pulmonary effort is normal.     Breath sounds: Normal breath sounds.  Musculoskeletal:     Cervical back: Normal range of motion.  Neurological:     Mental Status: He is alert.  Psychiatric:        Mood and Affect: Mood normal.        Behavior: Behavior normal.     Results for orders placed or performed in visit on 02/03/23  Rad Onc Aria Session Summary  Result Value Ref Range   Course ID [email or web address]    Course Intent Unknown    Course Start Date 12/31/2022 11:04 AM    Session Number 18    Course First Treatment Date 01/11/2023  1:19 PM    Course Last Treatment Date 02/03/2023 11:25 AM    Course Elapsed Days 23    Reference Point ID Pelvis DP    Reference Point Dosage Given to Date 32.4 Gy   Reference Point Session Dosage Given 1.8 Gy   Plan ID Rectum    Plan Name Rectum    Plan Fractions Treated to Date 18    Plan Total Fractions Prescribed 25    Plan Prescribed Dose Per Fraction 1.8 Gy   Plan Total Prescribed Dose 45.000000 Gy   Plan Primary Reference Point Pelvis DP   Results for orders placed or performed in visit on 02/03/23  Lipid panel  Result Value Ref Range   Cholesterol, Total 95 (L) 100 - 199 mg/dL   Triglycerides 155 (H) 0 - 149 mg/dL   HDL 39 (L) >39 mg/dL   VLDL Cholesterol Cal 26 5 - 40 mg/dL   LDL Chol Calc (NIH) 30 0 - 99 mg/dL   Chol/HDL Ratio 2.4 0.0 - 5.0 ratio  VITAMIN D 25 Hydroxy (Vit-D Deficiency, Fractures)  Result Value Ref Range   Vit D, 25-Hydroxy 19.2 (L) 30.0 - 100.0 ng/mL  CBC With Differential  Result Value Ref Range   WBC 4.8 3.4 - 10.8 x10E3/uL   RBC 3.96 (L) 4.14 - 5.80 x10E6/uL   Hemoglobin 11.7 (L) 13.0 - 17.7 g/dL   Hematocrit 37.3 (L) 37.5 - 51.0 %   MCV 94 79 - 97 fL   MCH  29.5 26.6 - 33.0 pg   MCHC 31.4 (L) 31.5  - 35.7 g/dL   RDW 23.9 (H) 11.6 - 15.4 %   Neutrophils 80 Not Estab. %   Lymphs 6 Not Estab. %   Monocytes 9 Not Estab. %   Eos 5 Not Estab. %   Basos 0 Not Estab. %   Neutrophils Absolute 3.8 1.4 - 7.0 x10E3/uL   Lymphocytes Absolute 0.3 (L) 0.7 - 3.1 x10E3/uL   Monocytes Absolute 0.5 0.1 - 0.9 x10E3/uL   EOS (ABSOLUTE) 0.2 0.0 - 0.4 x10E3/uL   Basophils Absolute 0.0 0.0 - 0.2 x10E3/uL   Immature Granulocytes 0 Not Estab. %   Immature Grans (Abs) 0.0 0.0 - 0.1 x10E3/uL   Hematology Comments: Note:   CMP14+EGFR  Result Value Ref Range   Glucose 167 (H) 70 - 99 mg/dL   BUN 17 8 - 27 mg/dL   Creatinine, Ser 1.09 0.76 - 1.27 mg/dL   eGFR 71 >59 mL/min/1.73   BUN/Creatinine Ratio 16 10 - 24   Sodium 142 134 - 144 mmol/L   Potassium 4.0 3.5 - 5.2 mmol/L   Chloride 103 96 - 106 mmol/L   CO2 22 20 - 29 mmol/L   Calcium 9.2 8.6 - 10.2 mg/dL   Total Protein 6.4 6.0 - 8.5 g/dL   Albumin 4.1 3.8 - 4.8 g/dL   Globulin, Total 2.3 1.5 - 4.5 g/dL   Albumin/Globulin Ratio 1.8 1.2 - 2.2   Bilirubin Total 0.6 0.0 - 1.2 mg/dL   Alkaline Phosphatase 106 44 - 121 IU/L   AST 36 0 - 40 IU/L   ALT 42 0 - 44 IU/L  Hemoglobin A1c  Result Value Ref Range   Hgb A1c MFr Bld 7.2 (H) 4.8 - 5.6 %   Est. average glucose Bld gHb Est-mCnc 160 mg/dL    Recent Results (from the past 2160 hour(s))  Comprehensive metabolic panel     Status: Abnormal   Collection Time: 11/18/22  8:33 AM  Result Value Ref Range   Sodium 137 135 - 145 mmol/L   Potassium 3.5 3.5 - 5.1 mmol/L   Chloride 103 98 - 111 mmol/L   CO2 24 22 - 32 mmol/L   Glucose, Bld 243 (H) 70 - 99 mg/dL    Comment: Glucose reference range applies only to samples taken after fasting for at least 8 hours.   BUN 14 8 - 23 mg/dL   Creatinine, Ser 0.88 0.61 - 1.24 mg/dL   Calcium 8.6 (L) 8.9 - 10.3 mg/dL   Total Protein 7.0 6.5 - 8.1 g/dL   Albumin 3.8 3.5 - 5.0 g/dL   AST 32 15 - 41 U/L   ALT 30 0 - 44 U/L   Alkaline Phosphatase 130 (H) 38 -  126 U/L   Total Bilirubin 0.5 0.3 - 1.2 mg/dL   GFR, Estimated >60 >60 mL/min    Comment: (NOTE) Calculated using the CKD-EPI Creatinine Equation (2021)    Anion gap 10 5 - 15    Comment: Performed at [facility], [address] [place], [place] [address]  CBC with Differential     Status: Abnormal   Collection Time: 11/18/22  8:33 AM  Result Value Ref Range   WBC 12.5 (H) 4.0 - 10.5 K/uL   RBC 3.46 (L) 4.22 - 5.81 MIL/uL   Hemoglobin 9.1 (L) 13.0 - 17.0 g/dL   HCT 29.2 (L) 39.0 - 52.0 %   MCV 84.4 80.0 - 100.0 fL  MCH 26.3 26.0 - 34.0 pg   MCHC 31.2 30.0 - 36.0 g/dL   RDW 24.4 (H) 11.5 - 15.5 %   Platelets 96 (L) 150 - 400 K/uL    Comment: SPECIMEN CHECKED FOR CLOTS   nRBC 0.2 0.0 - 0.2 %   Neutrophils Relative % 78 %   Neutro Abs 9.8 (H) 1.7 - 7.7 K/uL   Lymphocytes Relative 12 %   Lymphs Abs 1.5 0.7 - 4.0 K/uL   Monocytes Relative 6 %   Monocytes Absolute 0.8 0.1 - 1.0 K/uL   Eosinophils Relative 1 %   Eosinophils Absolute 0.1 0.0 - 0.5 K/uL   Basophils Relative 0 %   Basophils Absolute 0.0 0.0 - 0.1 K/uL   Immature Granulocytes 3 %   Abs Immature Granulocytes 0.32 (H) 0.00 - 0.07 K/uL    Comment: Performed at [facility], [address] [place], [place] [address]  CBC with Differential     Status: Abnormal   Collection Time: 12/09/22  8:15 AM  Result Value Ref Range   WBC 10.3 4.0 - 10.5 K/uL   RBC 3.30 (L) 4.22 - 5.81 MIL/uL   Hemoglobin 9.1 (L) 13.0 - 17.0 g/dL   HCT 29.8 (L) 39.0 - 52.0 %   MCV 90.3 80.0 - 100.0 fL   MCH 27.6 26.0 - 34.0 pg   MCHC 30.5 30.0 - 36.0 g/dL   RDW 26.4 (H) 11.5 - 15.5 %   Platelets 202 150 - 400 K/uL   nRBC 0.0 0.0 - 0.2 %   Neutrophils Relative % 79 %   Neutro Abs 8.3 (H) 1.7 - 7.7 K/uL   Lymphocytes Relative 11 %   Lymphs Abs 1.2 0.7 - 4.0 K/uL   Monocytes Relative 7 %   Monocytes Absolute 0.7 0.1 - 1.0 K/uL   Eosinophils Relative 1 %   Eosinophils Absolute 0.1 0.0 - 0.5 K/uL   Basophils Relative 1 %    Basophils Absolute 0.1 0.0 - 0.1 K/uL   Immature Granulocytes 1 %   Abs Immature Granulocytes 0.07 0.00 - 0.07 K/uL    Comment: Performed at [facility], [address] [place], [place] [address]  Comprehensive metabolic panel     Status: Abnormal   Collection Time: 12/09/22  8:15 AM  Result Value Ref Range   Sodium 139 135 - 145 mmol/L   Potassium 3.9 3.5 - 5.1 mmol/L   Chloride 104 98 - 111 mmol/L   CO2 24 22 - 32 mmol/L   Glucose, Bld 197 (H) 70 - 99 mg/dL    Comment: Glucose reference range applies only to samples taken after fasting for at least 8 hours.   BUN 18 8 - 23 mg/dL   Creatinine, Ser 0.96 0.61 - 1.24 mg/dL   Calcium 8.9 8.9 - 10.3 mg/dL   Total Protein 6.7 6.5 - 8.1 g/dL   Albumin 3.6 3.5 - 5.0 g/dL   AST 31 15 - 41 U/L   ALT 27 0 - 44 U/L   Alkaline Phosphatase 112 38 - 126 U/L   Total Bilirubin 0.4 0.3 - 1.2 mg/dL   GFR, Estimated >60 >60 mL/min    Comment: (NOTE) Calculated using the CKD-EPI Creatinine Equation (2021)    Anion gap 11 5 - 15    Comment: Performed at [facility], [address], [place], [place] [address]  Comprehensive metabolic panel     Status: Abnormal   Collection Time: 12/23/22  8:16 AM  Result Value  Ref Range   Sodium 140 135 - 145 mmol/L   Potassium 3.2 (L) 3.5 - 5.1 mmol/L   Chloride 106 98 - 111 mmol/L   CO2 24 22 - 32 mmol/L   Glucose, Bld 178 (H) 70 - 99 mg/dL    Comment: Glucose reference range applies only to samples taken after fasting for at least 8 hours.   BUN 15 8 - 23 mg/dL   Creatinine, Ser 0.91 0.61 - 1.24 mg/dL   Calcium 8.9 8.9 - 10.3 mg/dL   Total Protein 6.6 6.5 - 8.1 g/dL   Albumin 3.7 3.5 - 5.0 g/dL   AST 31 15 - 41 U/L   ALT 25 0 - 44 U/L   Alkaline Phosphatase 137 (H) 38 - 126 U/L   Total Bilirubin 0.4 0.3 - 1.2 mg/dL   GFR, Estimated >60 >60 mL/min    Comment: (NOTE) Calculated using the CKD-EPI Creatinine Equation (2021)    Anion gap 10 5 - 15    Comment: Performed at [facility], [address] [place], [place] [address]  CBC with Differential     Status: Abnormal   Collection Time: 12/23/22  8:16 AM  Result Value Ref Range   WBC 9.6 4.0 - 10.5 K/uL   RBC 3.29 (L) 4.22 - 5.81 MIL/uL   Hemoglobin 9.1 (L) 13.0 - 17.0 g/dL   HCT 29.5 (L) 39.0 - 52.0 %   MCV 89.7 80.0 - 100.0 fL   MCH 27.7 26.0 - 34.0 pg   MCHC 30.8 30.0 - 36.0 g/dL   RDW 23.8 (H) 11.5 - 15.5 %   Platelets 63 (L) 150 - 400 K/uL    Comment: SPECIMEN CHECKED FOR CLOTS   nRBC 0.0 0.0 - 0.2 %   Neutrophils Relative % 78 %   Neutro Abs 7.5 1.7 - 7.7 K/uL   Lymphocytes Relative 12 %   Lymphs Abs 1.1 0.7 - 4.0 K/uL   Monocytes Relative 7 %   Monocytes Absolute 0.7 0.1 - 1.0 K/uL   Eosinophils Relative 2 %   Eosinophils Absolute 0.2 0.0 - 0.5 K/uL   Basophils Relative 0 %   Basophils Absolute 0.0 0.0 - 0.1 K/uL   Immature Granulocytes 1 %   Abs Immature Granulocytes 0.08 (H) 0.00 - 0.07 K/uL    Comment: Performed at [facility], [address] [place], [place] [address]  CBC with Differential     Status: Abnormal   Collection Time: 12/30/22  9:31 AM  Result Value Ref Range   WBC 8.2 4.0 - 10.5 K/uL   RBC 3.38 (L) 4.22 - 5.81 MIL/uL   Hemoglobin 9.3 (L) 13.0 - 17.0 g/dL   HCT 30.4 (L) 39.0 - 52.0 %   MCV 89.9 80.0 - 100.0 fL   MCH 27.5 26.0 - 34.0 pg   MCHC 30.6 30.0 - 36.0 g/dL   RDW 24.9 (H) 11.5 - 15.5 %   Platelets 134 (L) 150 - 400 K/uL   nRBC 0.0 0.0 - 0.2 %   Neutrophils Relative % 77 %   Neutro Abs 6.4 1.7 - 7.7 K/uL   Lymphocytes Relative 13 %   Lymphs Abs 1.1 0.7 - 4.0 K/uL   Monocytes Relative 7 %   Monocytes Absolute 0.6 0.1 - 1.0 K/uL   Eosinophils Relative 1 %   Eosinophils Absolute 0.1 0.0 - 0.5 K/uL   Basophils Relative 1 %   Basophils Absolute 0.0 0.0 - 0.1 K/uL   Immature Granulocytes 1 %  Abs Immature Granulocytes 0.05 0.00 - 0.07 K/uL    Comment: Performed at [facility] [place], [address] [place], [place] [address]  Comprehensive  metabolic panel     Status: Abnormal   Collection Time: 12/30/22  9:31 AM  Result Value Ref Range   Sodium 136 135 - 145 mmol/L   Potassium 3.5 3.5 - 5.1 mmol/L   Chloride 101 98 - 111 mmol/L   CO2 23 22 - 32 mmol/L   Glucose, Bld 249 (H) 70 - 99 mg/dL    Comment: Glucose reference range applies only to samples taken after fasting for at least 8 hours.   BUN 18 8 - 23 mg/dL   Creatinine, Ser 0.82 0.61 - 1.24 mg/dL   Calcium 8.8 (L) 8.9 - 10.3 mg/dL   Total Protein 6.6 6.5 - 8.1 g/dL   Albumin 3.7 3.5 - 5.0 g/dL   AST 30 15 - 41 U/L   ALT 26 0 - 44 U/L   Alkaline Phosphatase 113 38 - 126 U/L   Total Bilirubin 0.5 0.3 - 1.2 mg/dL   GFR, Estimated >60 >60 mL/min    Comment: (NOTE) Calculated using the CKD-EPI Creatinine Equation (2021)    Anion gap 12 5 - 15    Comment: Performed at [facility], [address] [place], [place] [address]  Rad Sandria Senter Session Summary     Status: None   Collection Time: 01/11/23  1:22 PM  Result Value Ref Range   Course ID [email or web address]    Course Intent Unknown    Course Start Date 12/31/2022 11:04 AM    Session Number 1    Course First Treatment Date 01/11/2023  1:19 PM    Course Last Treatment Date 01/11/2023  1:21 PM    Course Elapsed Days 0    Reference Point ID Pelvis DP    Reference Point Dosage Given to Date 1.8 Gy   Reference Point Session Dosage Given 1.8 Gy   Plan ID Rectum    Plan Name Rectum    Plan Fractions Treated to Date 1    Plan Total Fractions Prescribed 25    Plan Prescribed Dose Per Fraction 1.8 Gy   Plan Total Prescribed Dose 45.000000 Gy   Plan Primary Reference Point Pelvis DP   Rad Onc Aria Session Summary     Status: None   Collection Time: 01/12/23  1:23 PM  Result Value Ref Range   Course ID [email or web address]    Course Intent Unknown    Course Start Date 12/31/2022 11:04 AM    Session Number 2    Course First Treatment Date 01/11/2023  1:19 PM    Course Last Treatment Date 01/12/2023  1:23 PM    Course Elapsed Days 1     Reference Point ID Pelvis DP    Reference Point Dosage Given to Date 3.6 Gy   Reference Point Session Dosage Given 1.8 Gy   Plan ID Rectum    Plan Name Rectum    Plan Fractions Treated to Date 2    Plan Total Fractions Prescribed 25    Plan Prescribed Dose Per Fraction 1.8 Gy   Plan Total Prescribed Dose 45.000000 Gy   Plan Primary Reference Point Pelvis DP   Rad Onc Aria Session Summary     Status: None   Collection Time: 01/13/23  1:24 PM  Result Value Ref Range   Course ID [email or web address]    Course Intent Unknown    Course Start Date 12/31/2022  11:04 AM    Session Number 3    Course First Treatment Date 01/11/2023  1:19 PM    Course Last Treatment Date 01/13/2023  1:24 PM    Course Elapsed Days 2    Reference Point ID Pelvis DP    Reference Point Dosage Given to Date 5.4 Gy   Reference Point Session Dosage Given 1.8 Gy   Plan ID Rectum    Plan Name Rectum    Plan Fractions Treated to Date 3    Plan Total Fractions Prescribed 25    Plan Prescribed Dose Per Fraction 1.8 Gy   Plan Total Prescribed Dose 45.000000 Gy   Plan Primary Reference Point Pelvis DP   Rad Onc Aria Session Summary     Status: None   Collection Time: 01/14/23  1:24 PM  Result Value Ref Range   Course ID [email or web address]    Course Intent Unknown    Course Start Date 12/31/2022 11:04 AM    Session Number 4    Course First Treatment Date 01/11/2023  1:19 PM    Course Last Treatment Date 01/14/2023  1:23 PM    Course Elapsed Days 3    Reference Point ID Pelvis DP    Reference Point Dosage Given to Date 7.2 Gy   Reference Point Session Dosage Given 1.8 Gy   Plan ID Rectum    Plan Name Rectum    Plan Fractions Treated to Date 4    Plan Total Fractions Prescribed 25    Plan Prescribed Dose Per Fraction 1.8 Gy   Plan Total Prescribed Dose 45.000000 Gy   Plan Primary Reference Point Pelvis DP   Rad Onc Aria Session Summary     Status: None   Collection Time: 01/15/23 11:40 AM  Result Value Ref Range   Course ID  [email or web address]    Course Intent Unknown    Course Start Date 12/31/2022 11:04 AM    Session Number 5    Course First Treatment Date 01/11/2023  1:19 PM    Course Last Treatment Date 01/15/2023 11:39 AM    Course Elapsed Days 4    Reference Point ID Pelvis DP    Reference Point Dosage Given to Date 9 Gy   Reference Point Session Dosage Given 1.8 Gy   Plan ID Rectum    Plan Name Rectum    Plan Fractions Treated to Date 5    Plan Total Fractions Prescribed 25    Plan Prescribed Dose Per Fraction 1.8 Gy   Plan Total Prescribed Dose 45.000000 Gy   Plan Primary Reference Point Pelvis DP   Rad Onc Aria Session Summary     Status: None   Collection Time: 01/18/23  1:22 PM  Result Value Ref Range   Course ID [email or web address]    Course Intent Unknown    Course Start Date 12/31/2022 11:04 AM    Session Number 6    Course First Treatment Date 01/11/2023  1:19 PM    Course Last Treatment Date 01/18/2023  1:22 PM    Course Elapsed Days 7    Reference Point ID Pelvis DP    Reference Point Dosage Given to Date 10.8 Gy   Reference Point Session Dosage Given 1.8 Gy   Plan ID Rectum    Plan Name Rectum    Plan Fractions Treated to Date 6    Plan Total Fractions Prescribed 25    Plan Prescribed Dose Per Fraction 1.8 Gy   Plan Total Prescribed Dose 45.000000 Gy  Plan Primary Reference Point Pelvis DP   Rad Onc Aria Session Summary     Status: None   Collection Time: 01/19/23  1:21 PM  Result Value Ref Range   Course ID [email or web address]    Course Intent Unknown    Course Start Date 12/31/2022 11:04 AM    Session Number 7    Course First Treatment Date 01/11/2023  1:19 PM    Course Last Treatment Date 01/19/2023  1:20 PM    Course Elapsed Days 8    Reference Point ID Pelvis DP    Reference Point Dosage Given to Date 12.6 Gy   Reference Point Session Dosage Given 1.8 Gy   Plan ID Rectum    Plan Name Rectum    Plan Fractions Treated to Date 7    Plan Total Fractions Prescribed 25    Plan Prescribed Dose Per Fraction  1.8 Gy   Plan Total Prescribed Dose 45.000000 Gy   Plan Primary Reference Point Pelvis DP   Rad Onc Aria Session Summary     Status: None   Collection Time: 01/20/23 10:23 AM  Result Value Ref Range   Course ID [email or web address]    Course Intent Unknown    Course Start Date 12/31/2022 11:04 AM    Session Number 8    Course First Treatment Date 01/11/2023  1:19 PM    Course Last Treatment Date 01/20/2023 10:23 AM    Course Elapsed Days 9    Reference Point ID Pelvis DP    Reference Point Dosage Given to Date 14.4 Gy   Reference Point Session Dosage Given 1.8 Gy   Plan ID Rectum    Plan Name Rectum    Plan Fractions Treated to Date 8    Plan Total Fractions Prescribed 25    Plan Prescribed Dose Per Fraction 1.8 Gy   Plan Total Prescribed Dose 45.000000 Gy   Plan Primary Reference Point Pelvis DP   Rad Onc Aria Session Summary     Status: None   Collection Time: 01/21/23 10:30 AM  Result Value Ref Range   Course ID [email or web address]    Course Intent Unknown    Course Start Date 12/31/2022 11:04 AM    Session Number 9    Course First Treatment Date 01/11/2023  1:19 PM    Course Last Treatment Date 01/21/2023 10:30 AM    Course Elapsed Days 10    Reference Point ID Pelvis DP    Reference Point Dosage Given to Date 16.2 Gy   Reference Point Session Dosage Given 1.8 Gy   Plan ID Rectum    Plan Name Rectum    Plan Fractions Treated to Date 9    Plan Total Fractions Prescribed 25    Plan Prescribed Dose Per Fraction 1.8 Gy   Plan Total Prescribed Dose 45.000000 Gy   Plan Primary Reference Point Pelvis DP   Rad Onc Aria Session Summary     Status: None   Collection Time: 01/22/23 10:26 AM  Result Value Ref Range   Course ID [email or web address]    Course Intent Unknown    Course Start Date 12/31/2022 11:04 AM    Session Number 10    Course First Treatment Date 01/11/2023  1:19 PM    Course Last Treatment Date 01/22/2023 10:25 AM    Course Elapsed Days 11    Reference Point ID Pelvis DP    Reference Point Dosage  Given to Date 18 Gy   Reference Point Session Dosage Given 1.8 Gy  Plan ID Rectum    Plan Name Rectum    Plan Fractions Treated to Date 10    Plan Total Fractions Prescribed 25    Plan Prescribed Dose Per Fraction 1.8 Gy   Plan Total Prescribed Dose 45.000000 Gy   Plan Primary Reference Point Pelvis DP   Rad Onc Aria Session Summary     Status: None   Collection Time: 01/25/23 10:25 AM  Result Value Ref Range   Course ID [email or web address]    Course Intent Unknown    Course Start Date 12/31/2022 11:04 AM    Session Number 11    Course First Treatment Date 01/11/2023  1:19 PM    Course Last Treatment Date 01/25/2023 10:25 AM    Course Elapsed Days 14    Reference Point ID Pelvis DP    Reference Point Dosage Given to Date 19.8 Gy   Reference Point Session Dosage Given 1.8 Gy   Plan ID Rectum    Plan Name Rectum    Plan Fractions Treated to Date 11    Plan Total Fractions Prescribed 25    Plan Prescribed Dose Per Fraction 1.8 Gy   Plan Total Prescribed Dose 45.000000 Gy   Plan Primary Reference Point Pelvis DP   CMP (Cancer Center only)     Status: Abnormal   Collection Time: 01/25/23 10:33 AM  Result Value Ref Range   Sodium 140 135 - 145 mmol/L   Potassium 3.5 3.5 - 5.1 mmol/L   Chloride 105 98 - 111 mmol/L   CO2 25 22 - 32 mmol/L   Glucose, Bld 196 (H) 70 - 99 mg/dL    Comment: Glucose reference range applies only to samples taken after fasting for at least 8 hours.   BUN 17 8 - 23 mg/dL   Creatinine 1.03 0.61 - 1.24 mg/dL   Calcium 9.1 8.9 - 10.3 mg/dL   Total Protein 7.1 6.5 - 8.1 g/dL   Albumin 4.0 3.5 - 5.0 g/dL   AST 31 15 - 41 U/L   ALT 28 0 - 44 U/L   Alkaline Phosphatase 102 38 - 126 U/L   Total Bilirubin 0.7 0.3 - 1.2 mg/dL   GFR, Estimated >60 >60 mL/min    Comment: (NOTE) Calculated using the CKD-EPI Creatinine Equation (2021)    Anion gap 10 5 - 15    Comment: Performed at [facility], [address] [place], [place] [address]  CBC with  Differential/Platelet     Status: Abnormal   Collection Time: 01/25/23 10:33 AM  Result Value Ref Range   WBC 7.0 4.0 - 10.5 K/uL   RBC 3.55 (L) 4.22 - 5.81 MIL/uL   Hemoglobin 10.4 (L) 13.0 - 17.0 g/dL   HCT 33.4 (L) 39.0 - 52.0 %   MCV 94.1 80.0 - 100.0 fL   MCH 29.3 26.0 - 34.0 pg   MCHC 31.1 30.0 - 36.0 g/dL   RDW 23.1 (H) 11.5 - 15.5 %   Platelets 102 (L) 150 - 400 K/uL   nRBC 0.0 0.0 - 0.2 %   Neutrophils Relative % 85 %   Neutro Abs 5.9 1.7 - 7.7 K/uL   Lymphocytes Relative 7 %   Lymphs Abs 0.5 (L) 0.7 - 4.0 K/uL   Monocytes Relative 6 %   Monocytes Absolute 0.4 0.1 - 1.0 K/uL   Eosinophils Relative 2 %   Eosinophils Absolute 0.1 0.0 - 0.5 K/uL   Basophils Relative 0 %   Basophils Absolute 0.0 0.0 -  0.1 K/uL   Immature Granulocytes 0 %   Abs Immature Granulocytes 0.03 0.00 - 0.07 K/uL    Comment: Performed at [facility], [address] [place], [place] [address]  Joella Prince Session Summary     Status: None   Collection Time: 01/26/23 10:35 AM  Result Value Ref Range   Course ID [email or web address]    Course Intent Unknown    Course Start Date 12/31/2022 11:04 AM    Session Number 12    Course First Treatment Date 01/11/2023  1:19 PM    Course Last Treatment Date 01/26/2023 10:35 AM    Course Elapsed Days 15    Reference Point ID Pelvis DP    Reference Point Dosage Given to Date 21.6 Gy   Reference Point Session Dosage Given 1.8 Gy   Plan ID Rectum    Plan Name Rectum    Plan Fractions Treated to Date 12    Plan Total Fractions Prescribed 25    Plan Prescribed Dose Per Fraction 1.8 Gy   Plan Total Prescribed Dose 45.000000 Gy   Plan Primary Reference Point Pelvis DP   Rad Onc Aria Session Summary     Status: None   Collection Time: 01/27/23 10:34 AM  Result Value Ref Range   Course ID [email or web address]    Course Intent Unknown    Course Start Date 12/31/2022 11:04 AM    Session Number 13    Course First Treatment Date 01/11/2023  1:19 PM    Course Last Treatment Date  01/27/2023 10:33 AM    Course Elapsed Days 16    Reference Point ID Pelvis DP    Reference Point Dosage Given to Date 23.4 Gy   Reference Point Session Dosage Given 1.8 Gy   Plan ID Rectum    Plan Name Rectum    Plan Fractions Treated to Date 13    Plan Total Fractions Prescribed 25    Plan Prescribed Dose Per Fraction 1.8 Gy   Plan Total Prescribed Dose 45.000000 Gy   Plan Primary Reference Point Pelvis DP   Rad Onc Aria Session Summary     Status: None   Collection Time: 01/28/23 10:42 AM  Result Value Ref Range   Course ID [email or web address]    Course Intent Unknown    Course Start Date 12/31/2022 11:04 AM    Session Number 14    Course First Treatment Date 01/11/2023  1:19 PM    Course Last Treatment Date 01/28/2023 10:41 AM    Course Elapsed Days 17    Reference Point ID Pelvis DP    Reference Point Dosage Given to Date 25.2 Gy   Reference Point Session Dosage Given 1.8 Gy   Plan ID Rectum    Plan Name Rectum    Plan Fractions Treated to Date 14    Plan Total Fractions Prescribed 25    Plan Prescribed Dose Per Fraction 1.8 Gy   Plan Total Prescribed Dose 45.000000 Gy   Plan Primary Reference Point Pelvis DP   Rad Onc Aria Session Summary     Status: None   Collection Time: 01/29/23 10:32 AM  Result Value Ref Range   Course ID [email or web address]    Course Intent Unknown    Course Start Date 12/31/2022 11:04 AM    Session Number 15    Course First Treatment Date 01/11/2023  1:19 PM    Course Last Treatment Date 01/29/2023 10:31 AM    Course Elapsed Days 18    Reference  Point ID Pelvis DP    Reference Point Dosage Given to Date 27 Gy   Reference Point Session Dosage Given 1.8 Gy   Plan ID Rectum    Plan Name Rectum    Plan Fractions Treated to Date 15    Plan Total Fractions Prescribed 25    Plan Prescribed Dose Per Fraction 1.8 Gy   Plan Total Prescribed Dose 45.000000 Gy   Plan Primary Reference Point Pelvis DP   Rad Onc Aria Session Summary     Status: None   Collection Time:  02/01/23 10:33 AM  Result Value Ref Range   Course ID [email or web address]    Course Intent Unknown    Course Start Date 12/31/2022 11:04 AM    Session Number 16    Course First Treatment Date 01/11/2023  1:19 PM    Course Last Treatment Date 02/01/2023 10:33 AM    Course Elapsed Days 21    Reference Point ID Pelvis DP    Reference Point Dosage Given to Date 28.8 Gy   Reference Point Session Dosage Given 1.8 Gy   Plan ID Rectum    Plan Name Rectum    Plan Fractions Treated to Date 40    Plan Total Fractions Prescribed 25    Plan Prescribed Dose Per Fraction 1.8 Gy   Plan Total Prescribed Dose 45.000000 Gy   Plan Primary Reference Point Pelvis DP   Rad Onc Aria Session Summary     Status: None   Collection Time: 02/02/23 10:38 AM  Result Value Ref Range   Course ID [email or web address]    Course Intent Unknown    Course Start Date 12/31/2022 11:04 AM    Session Number 17    Course First Treatment Date 01/11/2023  1:19 PM    Course Last Treatment Date 02/02/2023 10:38 AM    Course Elapsed Days 22    Reference Point ID Pelvis DP    Reference Point Dosage Given to Date 30.6 Gy   Reference Point Session Dosage Given 1.8 Gy   Plan ID Rectum    Plan Name Rectum    Plan Fractions Treated to Date 17    Plan Total Fractions Prescribed 25    Plan Prescribed Dose Per Fraction 1.8 Gy   Plan Total Prescribed Dose 45.000000 Gy   Plan Primary Reference Point Pelvis DP   Lipid panel     Status: Abnormal   Collection Time: 02/03/23 10:35 AM  Result Value Ref Range   Cholesterol, Total 95 (L) 100 - 199 mg/dL   Triglycerides 155 (H) 0 - 149 mg/dL   HDL 39 (L) >39 mg/dL   VLDL Cholesterol Cal 26 5 - 40 mg/dL   LDL Chol Calc (NIH) 30 0 - 99 mg/dL   Chol/HDL Ratio 2.4 0.0 - 5.0 ratio    Comment:                                   T. Chol/HDL Ratio                                             Men  Women                               1/2 Avg.Risk  3.4    3.3                                   Avg.Risk  5.0    4.4                                 2X Avg.Risk  9.6    7.1                                3X Avg.Risk 23.4   11.0   VITAMIN D 25 Hydroxy (Vit-D Deficiency, Fractures)     Status: Abnormal   Collection Time: 02/03/23 10:35 AM  Result Value Ref Range   Vit D, 25-Hydroxy 19.2 (L) 30.0 - 100.0 ng/mL    Comment: Vitamin D deficiency has been defined by the [place] practice guideline as a level of serum 25-OH vitamin D less than 20 ng/mL (1,2). The Endocrine Society went on to further define vitamin D insufficiency as a level between 21 and 29 ng/mL (2). 1. IOM (Institute of Medicine). 2010. Dietary reference    intakes for calcium and D. [place]: The    [employer]. 2. Holick MF, Binkley [place], Bischoff-Ferrari HA, et al.    Evaluation, treatment, and prevention of vitamin D    deficiency: an Endocrine Society clinical practice    guideline. JCEM. 2011 Jul; 96(7):1911-30.   CBC With Differential     Status: Abnormal   Collection Time: 02/03/23 10:35 AM  Result Value Ref Range   WBC 4.8 3.4 - 10.8 x10E3/uL   RBC 3.96 (L) 4.14 - 5.80 x10E6/uL   Hemoglobin 11.7 (L) 13.0 - 17.7 g/dL   Hematocrit 37.3 (L) 37.5 - 51.0 %   MCV 94 79 - 97 fL   MCH 29.5 26.6 - 33.0 pg   MCHC 31.4 (L) 31.5 - 35.7 g/dL   RDW 23.9 (H) 11.6 - 15.4 %   Neutrophils 80 Not Estab. %   Lymphs 6 Not Estab. %   Monocytes 9 Not Estab. %   Eos 5 Not Estab. %   Basos 0 Not Estab. %   Neutrophils Absolute 3.8 1.4 - 7.0 x10E3/uL   Lymphocytes Absolute 0.3 (L) 0.7 - 3.1 x10E3/uL   Monocytes Absolute 0.5 0.1 - 0.9 x10E3/uL   EOS (ABSOLUTE) 0.2 0.0 - 0.4 x10E3/uL   Basophils Absolute 0.0 0.0 - 0.2 x10E3/uL   Immature Granulocytes 0 Not Estab. %   Immature Grans (Abs) 0.0 0.0 - 0.1 x10E3/uL   Hematology Comments: Note:     Comment: Verified by microscopic examination.  CMP14+EGFR     Status: Abnormal   Collection Time: 02/03/23 10:35 AM  Result Value Ref Range   Glucose 167  (H) 70 - 99 mg/dL   BUN 17 8 - 27 mg/dL   Creatinine, Ser 1.09 0.76 - 1.27 mg/dL   eGFR 71 >59 mL/min/1.73   BUN/Creatinine Ratio 16 10 - 24   Sodium 142 134 - 144 mmol/L   Potassium 4.0 3.5 - 5.2 mmol/L   Chloride 103 96 - 106 mmol/L   CO2 22 20 - 29 mmol/L   Calcium 9.2 8.6 - 10.2 mg/dL   Total Protein 6.4 6.0 - 8.5 g/dL   Albumin 4.1 3.8 - 4.8 g/dL  Globulin, Total 2.3 1.5 - 4.5 g/dL   Albumin/Globulin Ratio 1.8 1.2 - 2.2   Bilirubin Total 0.6 0.0 - 1.2 mg/dL   Alkaline Phosphatase 106 44 - 121 IU/L   AST 36 0 - 40 IU/L   ALT 42 0 - 44 IU/L  Hemoglobin A1c     Status: Abnormal   Collection Time: 02/03/23 10:35 AM  Result Value Ref Range   Hgb A1c MFr Bld 7.2 (H) 4.8 - 5.6 %    Comment:          Prediabetes: 5.7 - 6.4          Diabetes: >6.4          Glycemic control for adults with diabetes: <7.0    Est. average glucose Bld gHb Est-mCnc 160 mg/dL  Rad Onc Aria Session Summary     Status: None   Collection Time: 02/03/23 11:25 AM  Result Value Ref Range   Course ID [email or web address]    Course Intent Unknown    Course Start Date 12/31/2022 11:04 AM    Session Number 18    Course First Treatment Date 01/11/2023  1:19 PM    Course Last Treatment Date 02/03/2023 11:25 AM    Course Elapsed Days 23    Reference Point ID Pelvis DP    Reference Point Dosage Given to Date 32.4 Gy   Reference Point Session Dosage Given 1.8 Gy   Plan ID Rectum    Plan Name Rectum    Plan Fractions Treated to Date 18    Plan Total Fractions Prescribed 25    Plan Prescribed Dose Per Fraction 1.8 Gy   Plan Total Prescribed Dose 45.000000 Gy   Plan Primary Reference Point Pelvis DP   Rad Onc Aria Session Summary     Status: None   Collection Time: 02/04/23 10:40 AM  Result Value Ref Range   Course ID [email or web address]    Course Intent Unknown    Course Start Date 12/31/2022 11:04 AM    Session Number 19    Course First Treatment Date 01/11/2023  1:19 PM    Course Last Treatment Date 02/04/2023 10:40 AM     Course Elapsed Days 24    Reference Point ID Pelvis DP    Reference Point Dosage Given to Date 34.2 Gy   Reference Point Session Dosage Given 1.8 Gy   Plan ID Rectum    Plan Name Rectum    Plan Fractions Treated to Date 67    Plan Total Fractions Prescribed 25    Plan Prescribed Dose Per Fraction 1.8 Gy   Plan Total Prescribed Dose 45.000000 Gy   Plan Primary Reference Point Pelvis DP   Rad Onc Aria Session Summary     Status: None   Collection Time: 02/05/23 10:33 AM  Result Value Ref Range   Course ID [email or web address]    Course Intent Unknown    Course Start Date 12/31/2022 11:04 AM    Session Number 20    Course First Treatment Date 01/11/2023  1:19 PM    Course Last Treatment Date 02/05/2023 10:32 AM    Course Elapsed Days 25    Reference Point ID Pelvis DP    Reference Point Dosage Given to Date 36 Gy   Reference Point Session Dosage Given 1.8 Gy   Plan ID Rectum    Plan Name Rectum    Plan Fractions Treated to Date 18    Plan Total Fractions Prescribed 25    Plan Prescribed Dose  Per Fraction 1.8 Gy   Plan Total Prescribed Dose 45.000000 Gy   Plan Primary Reference Point Pelvis DP       Assessment & Plan:   Problem List Items Addressed This Visit     Hyperlipidemia   Relevant Orders   Lipid panel (Completed)   CBC With Differential (Completed)   CMP14+EGFR (Completed)   Other Visit Diagnoses     Essential hypertension, benign    -  Primary   Relevant Orders   CBC With Differential (Completed)   CMP14+EGFR (Completed)   Vitamin D deficiency, unspecified       Relevant Orders   VITAMIN D 25 Hydroxy (Vit-D Deficiency, Fractures) (Completed)   CBC With Differential (Completed)   CMP14+EGFR (Completed)   Prediabetes       Relevant Orders   CBC With Differential (Completed)   CMP14+EGFR (Completed)   Hemoglobin A1c (Completed)       Return in about 6 months (around 08/06/2023).   Total time spent: 30 minutes  Mechele Claude, FNP  02/03/2023

## 2023-02-04 ENCOUNTER — Ambulatory Visit
Admission: RE | Admit: 2023-02-04 | Discharge: 2023-02-04 | Disposition: A | Discharge status: Home / Self Care | Payer: Medicare HMO | Source: Ambulatory Visit | Attending: Radiation Oncology | Admitting: Radiation Oncology

## 2023-02-04 ENCOUNTER — Encounter: Payer: Self-pay | Admitting: Oncology

## 2023-02-04 ENCOUNTER — Other Ambulatory Visit: Payer: Self-pay

## 2023-02-04 DIAGNOSIS — Z510 Encounter for antineoplastic radiation therapy: Secondary | ICD-10-CM | POA: Diagnosis not present

## 2023-02-04 DIAGNOSIS — C20 Malignant neoplasm of rectum: Secondary | ICD-10-CM | POA: Diagnosis not present

## 2023-02-04 DIAGNOSIS — E785 Hyperlipidemia, unspecified: Secondary | ICD-10-CM | POA: Diagnosis not present

## 2023-02-04 DIAGNOSIS — D649 Anemia, unspecified: Secondary | ICD-10-CM | POA: Diagnosis not present

## 2023-02-04 DIAGNOSIS — K219 Gastro-esophageal reflux disease without esophagitis: Secondary | ICD-10-CM | POA: Diagnosis not present

## 2023-02-04 DIAGNOSIS — I2510 Atherosclerotic heart disease of native coronary artery without angina pectoris: Secondary | ICD-10-CM | POA: Diagnosis not present

## 2023-02-04 DIAGNOSIS — D696 Thrombocytopenia, unspecified: Secondary | ICD-10-CM | POA: Diagnosis not present

## 2023-02-04 DIAGNOSIS — I10 Essential (primary) hypertension: Secondary | ICD-10-CM | POA: Diagnosis not present

## 2023-02-04 DIAGNOSIS — F17210 Nicotine dependence, cigarettes, uncomplicated: Secondary | ICD-10-CM | POA: Diagnosis not present

## 2023-02-04 LAB — CBC WITH DIFFERENTIAL
Basophils Absolute: 0 10*3/uL (ref 0.0–0.2)
Basos: 0 %
EOS (ABSOLUTE): 0.2 10*3/uL (ref 0.0–0.4)
Eos: 5 %
Hematocrit: 37.3 % — ABNORMAL LOW (ref 37.5–51.0)
Hemoglobin: 11.7 g/dL — ABNORMAL LOW (ref 13.0–17.7)
Immature Grans (Abs): 0 10*3/uL (ref 0.0–0.1)
Immature Granulocytes: 0 %
Lymphocytes Absolute: 0.3 10*3/uL — ABNORMAL LOW (ref 0.7–3.1)
Lymphs: 6 %
MCH: 29.5 pg (ref 26.6–33.0)
MCHC: 31.4 g/dL — ABNORMAL LOW (ref 31.5–35.7)
MCV: 94 fL (ref 79–97)
Monocytes Absolute: 0.5 10*3/uL (ref 0.1–0.9)
Monocytes: 9 %
Neutrophils Absolute: 3.8 10*3/uL (ref 1.4–7.0)
Neutrophils: 80 %
RBC: 3.96 x10E6/uL — ABNORMAL LOW (ref 4.14–5.80)
RDW: 23.9 % — ABNORMAL HIGH (ref 11.6–15.4)
WBC: 4.8 10*3/uL (ref 3.4–10.8)

## 2023-02-04 LAB — RAD ONC ARIA SESSION SUMMARY
Course Elapsed Days: 24
Plan Fractions Treated to Date: 19
Plan Prescribed Dose Per Fraction: 1.8 Gy
Plan Total Fractions Prescribed: 25
Plan Total Prescribed Dose: 45 Gy
Reference Point Dosage Given to Date: 34.2 Gy
Reference Point Session Dosage Given: 1.8 Gy
Session Number: 19

## 2023-02-04 LAB — CMP14+EGFR
ALT: 42 IU/L (ref 0–44)
AST: 36 IU/L (ref 0–40)
Albumin/Globulin Ratio: 1.8 (ref 1.2–2.2)
Albumin: 4.1 g/dL (ref 3.8–4.8)
Alkaline Phosphatase: 106 IU/L (ref 44–121)
BUN/Creatinine Ratio: 16 (ref 10–24)
BUN: 17 mg/dL (ref 8–27)
Bilirubin Total: 0.6 mg/dL (ref 0.0–1.2)
CO2: 22 mmol/L (ref 20–29)
Calcium: 9.2 mg/dL (ref 8.6–10.2)
Chloride: 103 mmol/L (ref 96–106)
Creatinine, Ser: 1.09 mg/dL (ref 0.76–1.27)
Globulin, Total: 2.3 g/dL (ref 1.5–4.5)
Glucose: 167 mg/dL — ABNORMAL HIGH (ref 70–99)
Potassium: 4 mmol/L (ref 3.5–5.2)
Sodium: 142 mmol/L (ref 134–144)
Total Protein: 6.4 g/dL (ref 6.0–8.5)
eGFR: 71 mL/min/{1.73_m2} (ref >59)

## 2023-02-04 LAB — VITAMIN D 25 HYDROXY (VIT D DEFICIENCY, FRACTURES): Vit D, 25-Hydroxy: 19.2 ng/mL — ABNORMAL LOW (ref 30.0–100.0)

## 2023-02-04 LAB — LIPID PANEL
Chol/HDL Ratio: 2.4 ratio (ref 0.0–5.0)
Cholesterol, Total: 95 mg/dL — ABNORMAL LOW (ref 100–199)
HDL: 39 mg/dL — ABNORMAL LOW (ref >39)
LDL Chol Calc (NIH): 30 mg/dL (ref 0–99)
Triglycerides: 155 mg/dL — ABNORMAL HIGH (ref 0–149)
VLDL Cholesterol Cal: 26 mg/dL (ref 5–40)

## 2023-02-04 LAB — HEMOGLOBIN A1C
Est. average glucose Bld gHb Est-mCnc: 160 mg/dL
Hgb A1c MFr Bld: 7.2 % — ABNORMAL HIGH (ref 4.8–5.6)

## 2023-02-05 ENCOUNTER — Ambulatory Visit
Admission: RE | Admit: 2023-02-05 | Discharge: 2023-02-05 | Disposition: A | Discharge status: Home / Self Care | Payer: Medicare HMO | Source: Ambulatory Visit | Attending: Radiation Oncology | Admitting: Radiation Oncology

## 2023-02-05 ENCOUNTER — Other Ambulatory Visit: Payer: Self-pay

## 2023-02-05 DIAGNOSIS — I2510 Atherosclerotic heart disease of native coronary artery without angina pectoris: Secondary | ICD-10-CM | POA: Diagnosis not present

## 2023-02-05 DIAGNOSIS — Z510 Encounter for antineoplastic radiation therapy: Secondary | ICD-10-CM | POA: Diagnosis not present

## 2023-02-05 DIAGNOSIS — D696 Thrombocytopenia, unspecified: Secondary | ICD-10-CM | POA: Diagnosis not present

## 2023-02-05 DIAGNOSIS — F17210 Nicotine dependence, cigarettes, uncomplicated: Secondary | ICD-10-CM | POA: Diagnosis not present

## 2023-02-05 DIAGNOSIS — E785 Hyperlipidemia, unspecified: Secondary | ICD-10-CM | POA: Diagnosis not present

## 2023-02-05 DIAGNOSIS — D649 Anemia, unspecified: Secondary | ICD-10-CM | POA: Diagnosis not present

## 2023-02-05 DIAGNOSIS — I10 Essential (primary) hypertension: Secondary | ICD-10-CM | POA: Diagnosis not present

## 2023-02-05 DIAGNOSIS — K219 Gastro-esophageal reflux disease without esophagitis: Secondary | ICD-10-CM | POA: Diagnosis not present

## 2023-02-05 DIAGNOSIS — C20 Malignant neoplasm of rectum: Secondary | ICD-10-CM | POA: Diagnosis not present

## 2023-02-05 LAB — RAD ONC ARIA SESSION SUMMARY
Course Elapsed Days: 25
Plan Fractions Treated to Date: 20
Plan Prescribed Dose Per Fraction: 1.8 Gy
Plan Total Fractions Prescribed: 25
Plan Total Prescribed Dose: 45 Gy
Reference Point Dosage Given to Date: 36 Gy
Reference Point Session Dosage Given: 1.8 Gy
Session Number: 20

## 2023-02-06 NOTE — Progress Notes (Unsigned)
Cardiology Office Note  Date:  02/08/2023   ID:  [name] [name], [name] [name] 03-10-1946, MRN [id number]  PCP:  Mechele Claude, FNP   Chief Complaint  Patient presents with   6 month follow up     Patient was diagnosed with rectal adenocarcinoma; is having radiation treatments. Patient c/o shortness of breath with over exertion. Medications reviewed by the patient verbally.     HPI:  Mr. Schlag is a 77 year old gentleman with  coronary artery disease,  occluded left circumflex, severely diseased proximal RCA with stent placed December 2009, with no intervention performed on the circumflex secondary to significant collateral circulation,  who continues to smoke one pack per day,  hyperlipidemia,  episodes of chest pain who previously worked in trucking, now retired,  who presents for routine follow up of his coronary artery disease.   Last seen by myself October 2023 Reports he continues to undergo treatment for rectal adenocarcinoma  On last clinic visit we talked about his symptoms of constipation 9/23 Presented to the emergency room, had work-up with CT CT showing short-segment area of narrowing within the distal sigmoid near the rectosigmoid junction.  Recent diagnosis of rectal adenocarcinoma, Stage IIA (cT3, cN0, cM0) Had diverting surgery, now with colostomy bag Poor appetite Reports having bloody stools 6 to 12 months  neoadjuvant FOLFOX every 2 weeks x 8 cycles followed by concurrent Xeloda and daily XRT.   Bleeding stopped Needs ostomy repair  Has port in place  Still smoking, 4-6 a day  Lab work reviewed A1C 7.2 CR 1.09 HGB 11.7 Low vit D:  19  Denies significant chest pain concerning for angina Stable Cholesterol at goal  EKG personally reviewed by myself on todays visit Normal sinus rhythm rate 84 bpm no significant ST-T wave changes  Father passed away 03/22/2023lots of stress, age >95 bpm   PMH:   has a past medical history of Anemia, Anginal pain  ([place]), Bradycardia, Cancer ([place]), Coronary artery disease, Depression, Diabetes mellitus without complication ([place]), GERD (gastroesophageal reflux disease), History of kidney stones, Hyperlipidemia, Hypertension, and Nephrolithiasis.  PSH:    Past Surgical History:  Procedure Laterality Date   CARDIAC CATHETERIZATION     CATARACT EXTRACTION     CORONARY ANGIOPLASTY     FLEXIBLE SIGMOIDOSCOPY N/A 07/27/2022   Procedure: FLEXIBLE SIGMOIDOSCOPY;  Surgeon: Lesly Rubenstein, MD;  Location: [facility] ENDOSCOPY;  Service: Endoscopy;  Laterality: N/A;   KIDNEY STONE SURGERY     NECK SURGERY  12/18/2011   nephrolithiasis     PORTACATH PLACEMENT Right 08/24/2022   Procedure: INSERTION PORT-A-CATH;  Surgeon: Ronny Bacon, MD;  Location: [facility] ORS;  Service: General;  Laterality: Right;   stent (other)     TRANSVERSE LOOP COLOSTOMY N/A 07/28/2022   Procedure: TRANSVERSE LOOP COLOSTOMY;  Surgeon: Ronny Bacon, MD;  Location: [facility] ORS;  Service: General;  Laterality: N/A;    Current Outpatient Medications  Medication Sig Dispense Refill   aspirin 81 MG EC tablet Take 81 mg by mouth daily.       atorvastatin (LIPITOR) 40 MG tablet Take 1 tablet (40 mg total) by mouth daily. 90 tablet 3   capecitabine (XELODA) 500 MG tablet Take 3 tablets (1,500 mg total) by mouth 2 (two) times daily after a meal. Take Monday-Friday. Take only on days of radiation. 168 tablet 0   cyanocobalamin (VITAMIN B12) 500 MCG tablet Take 500 mcg by mouth daily.     famotidine (PEPCID) 40 MG tablet Take 1 tablet (40  mg total) by mouth daily. 90 tablet 3   glimepiride (AMARYL) 4 MG tablet Take 4 mg by mouth daily with breakfast.     HYDROcodone-acetaminophen (NORCO/VICODIN) 5-325 MG tablet Take 1 tablet by mouth every 6 (six) hours as needed for moderate pain.     ibuprofen (ADVIL) 800 MG tablet Take 1 tablet (800 mg total) by mouth every 8 (eight) hours as needed. 30 tablet 0   isosorbide mononitrate (IMDUR) 30 MG 24 hr  tablet Take 1 tablet (30 mg total) by mouth daily. 90 tablet 3   lidocaine-prilocaine (EMLA) cream Apply to affected area once 30 g 3   metFORMIN (GLUCOPHAGE) 1000 MG tablet Take 1,000 mg by mouth 2 (two) times daily with a meal.     ondansetron (ZOFRAN) 8 MG tablet Take 1 tablet (8 mg total) by mouth every 8 (eight) hours as needed for nausea or vomiting. 60 tablet 2   pioglitazone (ACTOS) 15 MG tablet Take 15 mg by mouth daily.     prochlorperazine (COMPAZINE) 10 MG tablet Take 1 tablet (10 mg total) by mouth every 6 (six) hours as needed for nausea or vomiting. 60 tablet 2   nitroGLYCERIN (NITROSTAT) 0.4 MG SL tablet Place 1 tablet (0.4 mg total) under the tongue every 5 (five) minutes as needed for chest pain. (Patient not taking: Reported on 02/08/2023) 25 tablet 6   No current facility-administered medications for this visit.    Allergies:   Patient has no known allergies.   Social History:  The patient  reports that he has been smoking cigarettes. He has a 18.00 pack-year smoking history. He has never used smokeless tobacco. He reports that he does not drink alcohol and does not use drugs.   Family History:   Father had CAD, CABG age 51s  Review of Systems: Review of Systems  Constitutional: Negative.   HENT: Negative.    Respiratory: Negative.    Cardiovascular: Negative.   Gastrointestinal: Negative.   Musculoskeletal: Negative.   Neurological: Negative.   Psychiatric/Behavioral: Negative.    All other systems reviewed and are negative.   PHYSICAL EXAM: VS:  BP 126/68 (BP Location: Left Arm, Patient Position: Sitting, Cuff Size: Normal)   Pulse 86   Ht 6' (1.829 m)   Wt 181 lb 6 oz (82.3 kg)   SpO2 97%   BMI 24.60 kg/m  , BMI Body mass index is 24.6 kg/m.  Constitutional:  oriented to person, place, and time. No distress.  HENT:  Head: Grossly normal Eyes:  no discharge. No scleral icterus.  Neck: No JVD, no carotid bruits  Cardiovascular: Regular rate and rhythm,  no murmurs appreciated Pulmonary/Chest: Clear to auscultation bilaterally, no wheezes or rails Abdominal: Soft.  no distension.  no tenderness.  Musculoskeletal: Normal range of motion Neurological:  normal muscle tone. Coordination normal. No atrophy Skin: Skin warm and dry Psychiatric: normal affect, pleasant  Recent Labs: 07/31/2022: Magnesium 2.0 01/25/2023: Platelets 102 02/03/2023: ALT 42; BUN 17; Creatinine, Ser 1.09; Hemoglobin 11.7; Potassium 4.0; Sodium 142    Lipid Panel Lab Results  Component Value Date   CHOL 95 (L) 02/03/2023   HDL 39 (L) 02/03/2023   LDLCALC 30 02/03/2023   TRIG 155 (H) 02/03/2023    Wt Readings from Last 3 Encounters:  02/08/23 181 lb 6 oz (82.3 kg)  02/03/23 184 lb (83.5 kg)  01/25/23 186 lb 14.4 oz (84.8 kg)     ASSESSMENT AND PLAN:  Preop cardiovascular evaluation Acceptable risk for ostomy repair to  be done at [facility] in April No further cardiac testing needed   Atherosclerosis of native coronary artery without angina pectoris, unspecified whether native or transplanted heart -  Currently with no symptoms of angina. No further workup at this time. Continue current medication regimen. Smoking cessation recommended  TOBACCO ABUSE We have encouraged him to continue to work on weaning his cigarettes and smoking cessation. He will continue to work on this and does not want any assistance with chantix.    Uncontrolled type 2 diabetes mellitus with hyperosmolarity without coma, without long-term current use of insulin ([place]) followed by endocrine, A1c below 7 range  Mixed hyperlipidemia Cholesterol is at goal on the current lipid regimen. No changes to the medications were made.  Essential hypertension Blood pressure is well controlled on today's visit. No changes made to the medications.  Adjustment disorder Recently lost his father who is 7 years old Diagnosed with cancer, completing chemo and radiation  Rectal  adenocarcinoma Has diverting ostomy.  Completed chemo, XRT,   surgery  pending    Total encounter time more than 30 minutes  Greater than 50% was spent in counseling and coordination of care with the patient    Orders Placed This Encounter  Procedures   EKG 12-Lead      Signed, Esmond Plants, M.D., Ph.D. 02/08/2023  [place], [place]

## 2023-02-08 ENCOUNTER — Ambulatory Visit
Admission: RE | Admit: 2023-02-08 | Discharge: 2023-02-08 | Disposition: A | Discharge status: Home / Self Care | Payer: Medicare HMO | Source: Ambulatory Visit | Attending: Radiation Oncology | Admitting: Radiation Oncology

## 2023-02-08 ENCOUNTER — Other Ambulatory Visit: Payer: Self-pay

## 2023-02-08 ENCOUNTER — Ambulatory Visit: Payer: Medicare HMO | Attending: Cardiovascular Disease | Admitting: Cardiovascular Disease

## 2023-02-08 ENCOUNTER — Encounter: Payer: Self-pay | Admitting: Cardiovascular Disease

## 2023-02-08 VITALS — BP 126/68 | HR 86 | Ht 72.0 in | Wt 181.4 lb

## 2023-02-08 DIAGNOSIS — I10 Essential (primary) hypertension: Secondary | ICD-10-CM | POA: Diagnosis not present

## 2023-02-08 DIAGNOSIS — F17210 Nicotine dependence, cigarettes, uncomplicated: Secondary | ICD-10-CM | POA: Diagnosis not present

## 2023-02-08 DIAGNOSIS — Z720 Tobacco use: Secondary | ICD-10-CM

## 2023-02-08 DIAGNOSIS — E1142 Type 2 diabetes mellitus with diabetic polyneuropathy: Secondary | ICD-10-CM | POA: Diagnosis not present

## 2023-02-08 DIAGNOSIS — E785 Hyperlipidemia, unspecified: Secondary | ICD-10-CM | POA: Diagnosis not present

## 2023-02-08 DIAGNOSIS — Z510 Encounter for antineoplastic radiation therapy: Secondary | ICD-10-CM | POA: Diagnosis not present

## 2023-02-08 DIAGNOSIS — D649 Anemia, unspecified: Secondary | ICD-10-CM | POA: Diagnosis not present

## 2023-02-08 DIAGNOSIS — E1100 Type 2 diabetes mellitus with hyperosmolarity without nonketotic hyperglycemic-hyperosmolar coma (NKHHC): Secondary | ICD-10-CM

## 2023-02-08 DIAGNOSIS — F17200 Nicotine dependence, unspecified, uncomplicated: Secondary | ICD-10-CM

## 2023-02-08 DIAGNOSIS — D696 Thrombocytopenia, unspecified: Secondary | ICD-10-CM | POA: Diagnosis not present

## 2023-02-08 DIAGNOSIS — I2510 Atherosclerotic heart disease of native coronary artery without angina pectoris: Secondary | ICD-10-CM | POA: Diagnosis not present

## 2023-02-08 DIAGNOSIS — I25118 Atherosclerotic heart disease of native coronary artery with other forms of angina pectoris: Secondary | ICD-10-CM | POA: Diagnosis not present

## 2023-02-08 DIAGNOSIS — K219 Gastro-esophageal reflux disease without esophagitis: Secondary | ICD-10-CM | POA: Diagnosis not present

## 2023-02-08 DIAGNOSIS — E782 Mixed hyperlipidemia: Secondary | ICD-10-CM | POA: Diagnosis not present

## 2023-02-08 DIAGNOSIS — C20 Malignant neoplasm of rectum: Secondary | ICD-10-CM | POA: Diagnosis not present

## 2023-02-08 LAB — RAD ONC ARIA SESSION SUMMARY
Course Elapsed Days: 28
Plan Fractions Treated to Date: 21
Plan Prescribed Dose Per Fraction: 1.8 Gy
Plan Total Fractions Prescribed: 25
Plan Total Prescribed Dose: 45 Gy
Reference Point Dosage Given to Date: 37.8 Gy
Reference Point Session Dosage Given: 1.8 Gy
Session Number: 21

## 2023-02-08 MED ORDER — FAMOTIDINE 40 MG PO TABS: 40.0000 mg | ORAL_TABLET | Freq: Every day | ORAL | 3 refills | Status: DC

## 2023-02-08 MED ORDER — ISOSORBIDE MONONITRATE ER 30 MG PO TB24: 30.0000 mg | ORAL_TABLET | Freq: Every day | ORAL | 3 refills | Status: DC

## 2023-02-08 MED ORDER — ATORVASTATIN CALCIUM 40 MG PO TABS: 40.0000 mg | ORAL_TABLET | Freq: Every day | ORAL | 3 refills | Status: DC

## 2023-02-08 NOTE — Patient Instructions (Addendum)
Medication Instructions:  OTC vitamin D3  3 to 5 k units a day   If you need a refill on your cardiac medications before your next appointment, please call your pharmacy.   Lab work: No new labs needed  Testing/Procedures: No new testing needed  Follow-Up: At [facility], you and your health needs are our priority.  As part of our continuing mission to provide you with exceptional heart care, we have created designated Provider Care Teams.  These Care Teams include your primary Cardiologist (physician) and Advanced Practice Providers (APPs -  Physician Assistants and Nurse Practitioners) who all work together to provide you with the care you need, when you need it.  You will need a follow up appointment in 12 months  Providers on your designated Care Team:   Murray Hodgkins, NP Christell Faith, PA-C Cadence Kathlen Mody, [place]  COVID-19 Vaccine Information can be found at: [email or web address] For questions related to vaccine distribution or appointments, please email [email or web address] or call [email or web address].

## 2023-02-09 ENCOUNTER — Ambulatory Visit
Admission: RE | Admit: 2023-02-09 | Discharge: 2023-02-09 | Disposition: A | Discharge status: Home / Self Care | Payer: Medicare HMO | Source: Ambulatory Visit | Attending: Radiation Oncology | Admitting: Radiation Oncology

## 2023-02-09 ENCOUNTER — Other Ambulatory Visit: Payer: Self-pay

## 2023-02-09 DIAGNOSIS — I2510 Atherosclerotic heart disease of native coronary artery without angina pectoris: Secondary | ICD-10-CM | POA: Diagnosis not present

## 2023-02-09 DIAGNOSIS — D649 Anemia, unspecified: Secondary | ICD-10-CM | POA: Diagnosis not present

## 2023-02-09 DIAGNOSIS — E785 Hyperlipidemia, unspecified: Secondary | ICD-10-CM | POA: Diagnosis not present

## 2023-02-09 DIAGNOSIS — F17210 Nicotine dependence, cigarettes, uncomplicated: Secondary | ICD-10-CM | POA: Diagnosis not present

## 2023-02-09 DIAGNOSIS — I10 Essential (primary) hypertension: Secondary | ICD-10-CM | POA: Diagnosis not present

## 2023-02-09 DIAGNOSIS — Z510 Encounter for antineoplastic radiation therapy: Secondary | ICD-10-CM | POA: Diagnosis not present

## 2023-02-09 DIAGNOSIS — C20 Malignant neoplasm of rectum: Secondary | ICD-10-CM | POA: Diagnosis not present

## 2023-02-09 DIAGNOSIS — K219 Gastro-esophageal reflux disease without esophagitis: Secondary | ICD-10-CM | POA: Diagnosis not present

## 2023-02-09 DIAGNOSIS — D696 Thrombocytopenia, unspecified: Secondary | ICD-10-CM | POA: Diagnosis not present

## 2023-02-09 LAB — RAD ONC ARIA SESSION SUMMARY
Course Elapsed Days: 29
Plan Fractions Treated to Date: 22
Plan Prescribed Dose Per Fraction: 1.8 Gy
Plan Total Fractions Prescribed: 25
Plan Total Prescribed Dose: 45 Gy
Reference Point Dosage Given to Date: 39.6 Gy
Reference Point Session Dosage Given: 1.8 Gy
Session Number: 22

## 2023-02-10 ENCOUNTER — Other Ambulatory Visit: Payer: Self-pay

## 2023-02-10 ENCOUNTER — Ambulatory Visit
Admission: RE | Admit: 2023-02-10 | Discharge: 2023-02-10 | Disposition: A | Discharge status: Home / Self Care | Payer: Medicare HMO | Source: Ambulatory Visit | Attending: Radiation Oncology | Admitting: Radiation Oncology

## 2023-02-10 DIAGNOSIS — F17210 Nicotine dependence, cigarettes, uncomplicated: Secondary | ICD-10-CM | POA: Diagnosis not present

## 2023-02-10 DIAGNOSIS — D649 Anemia, unspecified: Secondary | ICD-10-CM | POA: Diagnosis not present

## 2023-02-10 DIAGNOSIS — I2510 Atherosclerotic heart disease of native coronary artery without angina pectoris: Secondary | ICD-10-CM | POA: Diagnosis not present

## 2023-02-10 DIAGNOSIS — Z510 Encounter for antineoplastic radiation therapy: Secondary | ICD-10-CM | POA: Diagnosis not present

## 2023-02-10 DIAGNOSIS — E785 Hyperlipidemia, unspecified: Secondary | ICD-10-CM | POA: Diagnosis not present

## 2023-02-10 DIAGNOSIS — D696 Thrombocytopenia, unspecified: Secondary | ICD-10-CM | POA: Diagnosis not present

## 2023-02-10 DIAGNOSIS — K219 Gastro-esophageal reflux disease without esophagitis: Secondary | ICD-10-CM | POA: Diagnosis not present

## 2023-02-10 DIAGNOSIS — I10 Essential (primary) hypertension: Secondary | ICD-10-CM | POA: Diagnosis not present

## 2023-02-10 DIAGNOSIS — C20 Malignant neoplasm of rectum: Secondary | ICD-10-CM | POA: Diagnosis not present

## 2023-02-10 LAB — RAD ONC ARIA SESSION SUMMARY
Course Elapsed Days: 30
Plan Fractions Treated to Date: 23
Plan Prescribed Dose Per Fraction: 1.8 Gy
Plan Total Fractions Prescribed: 25
Plan Total Prescribed Dose: 45 Gy
Reference Point Dosage Given to Date: 41.4 Gy
Reference Point Session Dosage Given: 1.8 Gy
Session Number: 23

## 2023-02-11 ENCOUNTER — Other Ambulatory Visit: Payer: Self-pay

## 2023-02-11 ENCOUNTER — Ambulatory Visit
Admission: RE | Admit: 2023-02-11 | Discharge: 2023-02-11 | Disposition: A | Discharge status: Home / Self Care | Payer: Medicare HMO | Source: Ambulatory Visit | Attending: Radiation Oncology | Admitting: Radiation Oncology

## 2023-02-11 DIAGNOSIS — F17210 Nicotine dependence, cigarettes, uncomplicated: Secondary | ICD-10-CM | POA: Diagnosis not present

## 2023-02-11 DIAGNOSIS — K219 Gastro-esophageal reflux disease without esophagitis: Secondary | ICD-10-CM | POA: Diagnosis not present

## 2023-02-11 DIAGNOSIS — Z452 Encounter for adjustment and management of vascular access device: Secondary | ICD-10-CM | POA: Diagnosis not present

## 2023-02-11 DIAGNOSIS — Z510 Encounter for antineoplastic radiation therapy: Secondary | ICD-10-CM | POA: Diagnosis not present

## 2023-02-11 DIAGNOSIS — D696 Thrombocytopenia, unspecified: Secondary | ICD-10-CM | POA: Diagnosis not present

## 2023-02-11 DIAGNOSIS — I10 Essential (primary) hypertension: Secondary | ICD-10-CM | POA: Diagnosis not present

## 2023-02-11 DIAGNOSIS — D649 Anemia, unspecified: Secondary | ICD-10-CM | POA: Diagnosis not present

## 2023-02-11 DIAGNOSIS — Z933 Colostomy status: Secondary | ICD-10-CM | POA: Diagnosis not present

## 2023-02-11 DIAGNOSIS — E785 Hyperlipidemia, unspecified: Secondary | ICD-10-CM | POA: Diagnosis not present

## 2023-02-11 DIAGNOSIS — I2510 Atherosclerotic heart disease of native coronary artery without angina pectoris: Secondary | ICD-10-CM | POA: Diagnosis not present

## 2023-02-11 DIAGNOSIS — C20 Malignant neoplasm of rectum: Secondary | ICD-10-CM | POA: Diagnosis not present

## 2023-02-11 LAB — RAD ONC ARIA SESSION SUMMARY
Course Elapsed Days: 31
Plan Fractions Treated to Date: 24
Plan Prescribed Dose Per Fraction: 1.8 Gy
Plan Total Fractions Prescribed: 25
Plan Total Prescribed Dose: 45 Gy
Reference Point Dosage Given to Date: 43.2 Gy
Reference Point Session Dosage Given: 1.8 Gy
Session Number: 24

## 2023-02-12 ENCOUNTER — Other Ambulatory Visit: Payer: Self-pay | Admitting: *Deleted

## 2023-02-12 ENCOUNTER — Ambulatory Visit: Admission: RE | Admit: 2023-02-12 | Payer: Medicare HMO | Source: Ambulatory Visit

## 2023-02-12 ENCOUNTER — Ambulatory Visit
Admission: RE | Admit: 2023-02-12 | Discharge: 2023-02-12 | Disposition: A | Discharge status: Home / Self Care | Payer: Medicare HMO | Source: Ambulatory Visit | Attending: Radiation Oncology | Admitting: Radiation Oncology

## 2023-02-12 ENCOUNTER — Other Ambulatory Visit: Payer: Self-pay

## 2023-02-12 DIAGNOSIS — C20 Malignant neoplasm of rectum: Secondary | ICD-10-CM

## 2023-02-12 DIAGNOSIS — D696 Thrombocytopenia, unspecified: Secondary | ICD-10-CM | POA: Diagnosis not present

## 2023-02-12 DIAGNOSIS — I10 Essential (primary) hypertension: Secondary | ICD-10-CM | POA: Diagnosis not present

## 2023-02-12 DIAGNOSIS — K219 Gastro-esophageal reflux disease without esophagitis: Secondary | ICD-10-CM | POA: Diagnosis not present

## 2023-02-12 DIAGNOSIS — I2510 Atherosclerotic heart disease of native coronary artery without angina pectoris: Secondary | ICD-10-CM | POA: Diagnosis not present

## 2023-02-12 DIAGNOSIS — Z510 Encounter for antineoplastic radiation therapy: Secondary | ICD-10-CM | POA: Diagnosis not present

## 2023-02-12 DIAGNOSIS — D649 Anemia, unspecified: Secondary | ICD-10-CM | POA: Diagnosis not present

## 2023-02-12 DIAGNOSIS — E785 Hyperlipidemia, unspecified: Secondary | ICD-10-CM | POA: Diagnosis not present

## 2023-02-12 DIAGNOSIS — F17210 Nicotine dependence, cigarettes, uncomplicated: Secondary | ICD-10-CM | POA: Diagnosis not present

## 2023-02-12 LAB — RAD ONC ARIA SESSION SUMMARY
Course Elapsed Days: 32
Plan Fractions Treated to Date: 25
Plan Prescribed Dose Per Fraction: 1.8 Gy
Plan Total Fractions Prescribed: 25
Plan Total Prescribed Dose: 45 Gy
Reference Point Dosage Given to Date: 45 Gy
Reference Point Session Dosage Given: 1.8 Gy
Session Number: 25

## 2023-02-15 ENCOUNTER — Other Ambulatory Visit: Payer: Self-pay | Admitting: Oncology

## 2023-02-15 ENCOUNTER — Other Ambulatory Visit: Payer: Self-pay

## 2023-02-15 ENCOUNTER — Ambulatory Visit
Admission: RE | Admit: 2023-02-15 | Discharge: 2023-02-15 | Disposition: A | Discharge status: Home / Self Care | Payer: Medicare HMO | Source: Ambulatory Visit | Attending: Radiation Oncology | Admitting: Radiation Oncology

## 2023-02-15 ENCOUNTER — Inpatient Hospital Stay: Payer: Medicare HMO | Attending: Oncology

## 2023-02-15 ENCOUNTER — Inpatient Hospital Stay (HOSPITAL_BASED_OUTPATIENT_CLINIC_OR_DEPARTMENT_OTHER): Payer: Medicare HMO | Admitting: Oncology

## 2023-02-15 ENCOUNTER — Encounter: Payer: Self-pay | Admitting: Oncology

## 2023-02-15 VITALS — BP 98/69 | HR 89 | Temp 96.5°F | Resp 18 | Ht 72.0 in | Wt 177.0 lb

## 2023-02-15 DIAGNOSIS — Z7982 Long term (current) use of aspirin: Secondary | ICD-10-CM | POA: Insufficient documentation

## 2023-02-15 DIAGNOSIS — D649 Anemia, unspecified: Secondary | ICD-10-CM | POA: Diagnosis not present

## 2023-02-15 DIAGNOSIS — R5383 Other fatigue: Secondary | ICD-10-CM | POA: Insufficient documentation

## 2023-02-15 DIAGNOSIS — Z87442 Personal history of urinary calculi: Secondary | ICD-10-CM | POA: Diagnosis not present

## 2023-02-15 DIAGNOSIS — E1165 Type 2 diabetes mellitus with hyperglycemia: Secondary | ICD-10-CM | POA: Insufficient documentation

## 2023-02-15 DIAGNOSIS — E785 Hyperlipidemia, unspecified: Secondary | ICD-10-CM | POA: Insufficient documentation

## 2023-02-15 DIAGNOSIS — K219 Gastro-esophageal reflux disease without esophagitis: Secondary | ICD-10-CM | POA: Insufficient documentation

## 2023-02-15 DIAGNOSIS — D696 Thrombocytopenia, unspecified: Secondary | ICD-10-CM | POA: Diagnosis not present

## 2023-02-15 DIAGNOSIS — Z79899 Other long term (current) drug therapy: Secondary | ICD-10-CM | POA: Diagnosis not present

## 2023-02-15 DIAGNOSIS — E860 Dehydration: Secondary | ICD-10-CM | POA: Insufficient documentation

## 2023-02-15 DIAGNOSIS — I10 Essential (primary) hypertension: Secondary | ICD-10-CM | POA: Insufficient documentation

## 2023-02-15 DIAGNOSIS — Z7902 Long term (current) use of antithrombotics/antiplatelets: Secondary | ICD-10-CM | POA: Insufficient documentation

## 2023-02-15 DIAGNOSIS — C20 Malignant neoplasm of rectum: Secondary | ICD-10-CM | POA: Insufficient documentation

## 2023-02-15 DIAGNOSIS — I2510 Atherosclerotic heart disease of native coronary artery without angina pectoris: Secondary | ICD-10-CM | POA: Insufficient documentation

## 2023-02-15 DIAGNOSIS — Z510 Encounter for antineoplastic radiation therapy: Secondary | ICD-10-CM | POA: Diagnosis not present

## 2023-02-15 DIAGNOSIS — E119 Type 2 diabetes mellitus without complications: Secondary | ICD-10-CM | POA: Insufficient documentation

## 2023-02-15 DIAGNOSIS — F17210 Nicotine dependence, cigarettes, uncomplicated: Secondary | ICD-10-CM | POA: Insufficient documentation

## 2023-02-15 DIAGNOSIS — Z7984 Long term (current) use of oral hypoglycemic drugs: Secondary | ICD-10-CM | POA: Insufficient documentation

## 2023-02-15 DIAGNOSIS — N289 Disorder of kidney and ureter, unspecified: Secondary | ICD-10-CM | POA: Insufficient documentation

## 2023-02-15 DIAGNOSIS — R630 Anorexia: Secondary | ICD-10-CM | POA: Insufficient documentation

## 2023-02-15 LAB — CBC WITH DIFFERENTIAL/PLATELET
Abs Immature Granulocytes: 0.01 10*3/uL (ref 0.00–0.07)
Basophils Absolute: 0 10*3/uL (ref 0.0–0.1)
Basophils Relative: 1 %
Eosinophils Absolute: 0.3 10*3/uL (ref 0.0–0.5)
Eosinophils Relative: 5 %
HCT: 38.7 % — ABNORMAL LOW (ref 39.0–52.0)
Hemoglobin: 12.6 g/dL — ABNORMAL LOW (ref 13.0–17.0)
Immature Granulocytes: 0 %
Lymphocytes Relative: 7 %
Lymphs Abs: 0.4 10*3/uL — ABNORMAL LOW (ref 0.7–4.0)
MCH: 31.1 pg (ref 26.0–34.0)
MCHC: 32.6 g/dL (ref 30.0–36.0)
MCV: 95.6 fL (ref 80.0–100.0)
Monocytes Absolute: 0.8 10*3/uL (ref 0.1–1.0)
Monocytes Relative: 14 %
Neutro Abs: 3.9 10*3/uL (ref 1.7–7.7)
Neutrophils Relative %: 73 %
Platelets: 161 10*3/uL (ref 150–400)
RBC: 4.05 MIL/uL — ABNORMAL LOW (ref 4.22–5.81)
RDW: 25.6 % — ABNORMAL HIGH (ref 11.5–15.5)
WBC: 5.4 10*3/uL (ref 4.0–10.5)
nRBC: 0 % (ref 0.0–0.2)

## 2023-02-15 LAB — CMP (CANCER CENTER ONLY)
ALT: 40 U/L (ref 0–44)
AST: 37 U/L (ref 15–41)
Albumin: 3.8 g/dL (ref 3.5–5.0)
Alkaline Phosphatase: 72 U/L (ref 38–126)
Anion gap: 11 (ref 5–15)
BUN: 32 mg/dL — ABNORMAL HIGH (ref 8–23)
CO2: 22 mmol/L (ref 22–32)
Calcium: 9.1 mg/dL (ref 8.9–10.3)
Chloride: 100 mmol/L (ref 98–111)
Creatinine: 1.47 mg/dL — ABNORMAL HIGH (ref 0.61–1.24)
GFR, Estimated: 50 mL/min — ABNORMAL LOW (ref >60)
Glucose, Bld: 189 mg/dL — ABNORMAL HIGH (ref 70–99)
Potassium: 4 mmol/L (ref 3.5–5.1)
Sodium: 133 mmol/L — ABNORMAL LOW (ref 135–145)
Total Bilirubin: 1 mg/dL (ref 0.3–1.2)
Total Protein: 6.7 g/dL (ref 6.5–8.1)

## 2023-02-15 LAB — RAD ONC ARIA SESSION SUMMARY
Course Elapsed Days: 35
Plan Fractions Treated to Date: 1
Plan Prescribed Dose Per Fraction: 1.8 Gy
Plan Total Fractions Prescribed: 3
Plan Total Prescribed Dose: 5.4 Gy
Reference Point Dosage Given to Date: 1.8 Gy
Reference Point Session Dosage Given: 1.8 Gy
Session Number: 26

## 2023-02-15 NOTE — Progress Notes (Signed)
[name] [name]  Telephone:(336) [phone] Fax:(336) [phone]  ID: [name] [name] OB: 1946-06-20  MR#: [id number]  [id number]  Patient Care Team: Mechele Claude, FNP as PCP - General (Family Medicine) [name] [name] November, MD as PCP - Cardiology (Cardiology) [name], MD as Consulting Physician (Cardiology) [name], RN as Oncology Nurse Navigator Grayland Ormond, [name] November, MD as Consulting Physician (Oncology)  CHIEF COMPLAINT: Stage IIa rectal adenocarcinoma.  INTERVAL HISTORY: Patient returns to clinic today for further evaluation near the conclusion of his concurrent Xeloda and XRT.  Over the past several days he has had poor appetite, increasing diarrhea, as well as increasing weakness and fatigue. He has no neurologic complaints.  He denies any recent fevers or illnesses.  He has no chest pain, shortness of breath, cough, or hemoptysis.  He denies any nausea, vomiting, or constipation.  He does not report any melena or hematochezia.  He has no urinary complaints.  Patient has no further specific complaints today.  REVIEW OF SYSTEMS:   Review of Systems  Constitutional:  Positive for malaise/fatigue and weight loss. Negative for fever.  Respiratory: Negative.  Negative for cough, hemoptysis and shortness of breath.   Cardiovascular: Negative.  Negative for chest pain and leg swelling.  Gastrointestinal:  Positive for diarrhea. Negative for abdominal pain.  Genitourinary: Negative.  Negative for dysuria.  Musculoskeletal: Negative.  Negative for back pain.  Skin: Negative.  Negative for rash.  Neurological:  Positive for weakness. Negative for dizziness, focal weakness and headaches.  Psychiatric/Behavioral: Negative.  The patient is not nervous/anxious.     As per HPI. Otherwise, a complete review of systems is negative.  PAST MEDICAL HISTORY: Past Medical History:  Diagnosis Date   Anemia    Anginal pain    Bradycardia    Cancer     Coronary artery disease    x 1 stent   Depression    Diabetes mellitus without complication    GERD (gastroesophageal reflux disease)    History of kidney stones    Hyperlipidemia    Hypertension    Nephrolithiasis     PAST SURGICAL HISTORY: Past Surgical History:  Procedure Laterality Date   CARDIAC CATHETERIZATION     CATARACT EXTRACTION     CORONARY ANGIOPLASTY     FLEXIBLE SIGMOIDOSCOPY N/A 07/27/2022   Procedure: FLEXIBLE SIGMOIDOSCOPY;  Surgeon: Lesly Rubenstein, MD;  Location: [facility];  Service: Endoscopy;  Laterality: N/A;   KIDNEY STONE SURGERY     NECK SURGERY  12/18/2011   nephrolithiasis     PORTACATH PLACEMENT Right 08/24/2022   Procedure: INSERTION PORT-A-CATH;  Surgeon: Ronny Bacon, MD;  Location: [facility];  Service: General;  Laterality: Right;   stent (other)     TRANSVERSE LOOP COLOSTOMY N/A 07/28/2022   Procedure: TRANSVERSE LOOP COLOSTOMY;  Surgeon: Ronny Bacon, MD;  Location: [facility];  Service: General;  Laterality: N/A;    FAMILY HISTORY: Family History  Problem Relation Age of Onset   Heart disease Father     ADVANCED DIRECTIVES (Y/N):  N  HEALTH MAINTENANCE: Social History   Tobacco Use   Smoking status: Every Day    Packs/day: 0.50    Years: 36.00    Additional pack years: 0.00    Total pack years: 18.00    Types: Cigarettes   Smokeless tobacco: Never   Tobacco comments:    Smokes 6 cigarettes daily   Vaping Use   Vaping Use: Never used  Substance Use Topics  Alcohol use: No   Drug use: No     Colonoscopy:  PAP:  Bone density:  Lipid panel:  No Known Allergies  Current Outpatient Medications  Medication Sig Dispense Refill   aspirin 81 MG EC tablet Take 81 mg by mouth daily.       atorvastatin (LIPITOR) 40 MG tablet Take 1 tablet (40 mg total) by mouth daily. 90 tablet 3   capecitabine (XELODA) 500 MG tablet Take 3 tablets (1,500 mg total) by mouth 2 (two) times daily after a meal. Take Monday-Friday.  Take only on days of radiation. 168 tablet 0   cyanocobalamin (VITAMIN B12) 500 MCG tablet Take 500 mcg by mouth daily.     famotidine (PEPCID) 40 MG tablet Take 1 tablet (40 mg total) by mouth daily. 90 tablet 3   glimepiride (AMARYL) 4 MG tablet Take 4 mg by mouth daily with breakfast.     HYDROcodone-acetaminophen (NORCO/VICODIN) 5-325 MG tablet Take 1 tablet by mouth every 6 (six) hours as needed for moderate pain.     ibuprofen (ADVIL) 800 MG tablet Take 1 tablet (800 mg total) by mouth every 8 (eight) hours as needed. 30 tablet 0   isosorbide mononitrate (IMDUR) 30 MG 24 hr tablet Take 1 tablet (30 mg total) by mouth daily. 90 tablet 3   lidocaine-prilocaine (EMLA) cream Apply to affected area once 30 g 3   metFORMIN (GLUCOPHAGE) 1000 MG tablet Take 1,000 mg by mouth 2 (two) times daily with a meal.     ondansetron (ZOFRAN) 8 MG tablet Take 1 tablet (8 mg total) by mouth every 8 (eight) hours as needed for nausea or vomiting. 60 tablet 2   pioglitazone (ACTOS) 15 MG tablet Take 15 mg by mouth daily.     prochlorperazine (COMPAZINE) 10 MG tablet Take 1 tablet (10 mg total) by mouth every 6 (six) hours as needed for nausea or vomiting. 60 tablet 2   nitroGLYCERIN (NITROSTAT) 0.4 MG SL tablet Place 1 tablet (0.4 mg total) under the tongue every 5 (five) minutes as needed for chest pain. (Patient not taking: Reported on 02/08/2023) 25 tablet 6   No current facility-administered medications for this visit.    OBJECTIVE: Vitals:   02/15/23 1119  BP: 98/69  Pulse: 89  Resp: 18  Temp: (!) 96.5 F (35.8 C)  SpO2: 100%      Body mass index is 24.01 kg/m.    ECOG FS:0 - Asymptomatic  General: Well-developed, well-nourished, no acute distress. Eyes: Pink conjunctiva, anicteric sclera. HEENT: Normocephalic, moist mucous membranes. Lungs: No audible wheezing or coughing. Heart: Regular rate and rhythm. Abdomen: Soft, nontender, no obvious distention.  Colostomy bag noted. Musculoskeletal:  No edema, cyanosis, or clubbing. Neuro: Alert, answering all questions appropriately. Cranial nerves grossly intact. Skin: No rashes or petechiae noted. Psych: Normal affect.   LAB RESULTS:  Lab Results  Component Value Date   NA 133 (L) 02/15/2023   K 4.0 02/15/2023   CL 100 02/15/2023   CO2 22 02/15/2023   GLUCOSE 189 (H) 02/15/2023   BUN 32 (H) 02/15/2023   CREATININE 1.47 (H) 02/15/2023   CALCIUM 9.1 02/15/2023   PROT 6.7 02/15/2023   ALBUMIN 3.8 02/15/2023   AST 37 02/15/2023   ALT 40 02/15/2023   ALKPHOS 72 02/15/2023   BILITOT 1.0 02/15/2023   GFRNONAA 50 (L) 02/15/2023   GFRAA >60 12/20/2014    Lab Results  Component Value Date   WBC 5.4 02/15/2023   NEUTROABS 3.9 02/15/2023  HGB 12.6 (L) 02/15/2023   HCT 38.7 (L) 02/15/2023   MCV 95.6 02/15/2023   PLT 161 02/15/2023     STUDIES: No results found.  ASSESSMENT: Stage IIa rectal adenocarcinoma.  PLAN:    Stage IIa rectal adenocarcinoma: Diagnosis confirmed by imaging and biopsy.  MRI and CT scan results reviewed independently and reported as above confirming stage of disease.  Patient has now completed neoadjuvant FOLFOX receiving his final dose on December 30, 2022.  Continue Xeloda and daily XRT, completing treatment on February 17, 2023.  Patient has an appointment on March 01, 2023 with Dr. Michael Boston.  Return to clinic in 4 weeks for routine evaluation.   Anemia: Hemoglobin improved to 12.6, but suspect there may be some component of hemoconcentration. Thrombocytopenia: Resolved.   Hyperglycemia: Chronic and changed.  Continue monitoring and treatment per primary care. Poor appetite/dehydration: Return to clinic for IV fluids tomorrow and Friday. Renal insufficiency: Patient's creatinine is trended up slightly to 1.47.  IV fluids as above.   Patient expressed understanding and was in agreement with this plan. He also understands that He can call clinic at any time with any questions, concerns, or  complaints.    Cancer Staging  Rectal adenocarcinoma Staging form: Colon and Rectum, AJCC 8th Edition - Clinical stage from 08/13/2022: Stage IIA (cT3, cN0, cM0) - Signed by Lloyd Huger, MD on 08/13/2022 Total positive nodes: 0  Lloyd Huger, MD   02/15/2023 12:31 PM

## 2023-02-15 NOTE — Progress Notes (Signed)
Patients states he is not feeling well. Has had some nausea and diarrhea. Has not had much of an appetite starting over weekend. Having some weakness and dizziness.

## 2023-02-16 ENCOUNTER — Other Ambulatory Visit: Payer: Self-pay

## 2023-02-16 ENCOUNTER — Ambulatory Visit
Admission: RE | Admit: 2023-02-16 | Discharge: 2023-02-16 | Disposition: A | Discharge status: Home / Self Care | Payer: Medicare HMO | Source: Ambulatory Visit | Attending: Radiation Oncology | Admitting: Radiation Oncology

## 2023-02-16 ENCOUNTER — Inpatient Hospital Stay: Payer: Medicare HMO

## 2023-02-16 ENCOUNTER — Other Ambulatory Visit: Payer: Self-pay | Admitting: *Deleted

## 2023-02-16 VITALS — BP 100/70 | HR 90 | Temp 96.6°F | Resp 18

## 2023-02-16 DIAGNOSIS — E785 Hyperlipidemia, unspecified: Secondary | ICD-10-CM | POA: Diagnosis not present

## 2023-02-16 DIAGNOSIS — D696 Thrombocytopenia, unspecified: Secondary | ICD-10-CM | POA: Diagnosis not present

## 2023-02-16 DIAGNOSIS — I2510 Atherosclerotic heart disease of native coronary artery without angina pectoris: Secondary | ICD-10-CM | POA: Diagnosis not present

## 2023-02-16 DIAGNOSIS — C20 Malignant neoplasm of rectum: Secondary | ICD-10-CM

## 2023-02-16 DIAGNOSIS — K219 Gastro-esophageal reflux disease without esophagitis: Secondary | ICD-10-CM | POA: Diagnosis not present

## 2023-02-16 DIAGNOSIS — R112 Nausea with vomiting, unspecified: Secondary | ICD-10-CM

## 2023-02-16 DIAGNOSIS — Z510 Encounter for antineoplastic radiation therapy: Secondary | ICD-10-CM | POA: Diagnosis not present

## 2023-02-16 DIAGNOSIS — D649 Anemia, unspecified: Secondary | ICD-10-CM | POA: Diagnosis not present

## 2023-02-16 DIAGNOSIS — I10 Essential (primary) hypertension: Secondary | ICD-10-CM | POA: Diagnosis not present

## 2023-02-16 DIAGNOSIS — F17210 Nicotine dependence, cigarettes, uncomplicated: Secondary | ICD-10-CM | POA: Diagnosis not present

## 2023-02-16 LAB — RAD ONC ARIA SESSION SUMMARY
Course Elapsed Days: 36
Plan Fractions Treated to Date: 2
Plan Prescribed Dose Per Fraction: 1.8 Gy
Plan Total Fractions Prescribed: 3
Plan Total Prescribed Dose: 5.4 Gy
Reference Point Dosage Given to Date: 3.6 Gy
Reference Point Session Dosage Given: 1.8 Gy
Session Number: 27

## 2023-02-16 MED ORDER — SODIUM CHLORIDE 0.9 % IV SOLN
Freq: Once | INTRAVENOUS | Status: AC
  Filled 2023-02-16: qty 250

## 2023-02-16 MED ORDER — DIPHENOXYLATE-ATROPINE 2.5-0.025 MG PO TABS: 1.0000 | ORAL_TABLET | Freq: Four times a day (QID) | ORAL | 1 refills | Status: DC | PRN

## 2023-02-16 MED ORDER — SODIUM CHLORIDE 0.9% FLUSH
10.0000 mL | Freq: Once | INTRAVENOUS | Status: AC | PRN
  Administered 2023-02-16: 10 mL
  Filled 2023-02-16: qty 10

## 2023-02-16 MED ORDER — ONDANSETRON HCL 4 MG/2ML IJ SOLN
8.0000 mg | Freq: Once | INTRAMUSCULAR | Status: AC
  Administered 2023-02-16: 8 mg via INTRAVENOUS
  Filled 2023-02-16: qty 4

## 2023-02-16 MED ORDER — HEPARIN SOD (PORK) LOCK FLUSH 100 UNIT/ML IV SOLN
500.0000 [IU] | Freq: Once | INTRAVENOUS | Status: AC | PRN
  Administered 2023-02-16: 500 [IU]
  Filled 2023-02-16: qty 5

## 2023-02-17 ENCOUNTER — Other Ambulatory Visit: Payer: Self-pay

## 2023-02-17 ENCOUNTER — Ambulatory Visit
Admission: RE | Admit: 2023-02-17 | Discharge: 2023-02-17 | Disposition: A | Discharge status: Home / Self Care | Payer: Medicare HMO | Source: Ambulatory Visit | Attending: Radiation Oncology | Admitting: Radiation Oncology

## 2023-02-17 DIAGNOSIS — K219 Gastro-esophageal reflux disease without esophagitis: Secondary | ICD-10-CM | POA: Diagnosis not present

## 2023-02-17 DIAGNOSIS — D696 Thrombocytopenia, unspecified: Secondary | ICD-10-CM | POA: Diagnosis not present

## 2023-02-17 DIAGNOSIS — C20 Malignant neoplasm of rectum: Secondary | ICD-10-CM | POA: Diagnosis not present

## 2023-02-17 DIAGNOSIS — I10 Essential (primary) hypertension: Secondary | ICD-10-CM | POA: Diagnosis not present

## 2023-02-17 DIAGNOSIS — Z510 Encounter for antineoplastic radiation therapy: Secondary | ICD-10-CM | POA: Diagnosis not present

## 2023-02-17 DIAGNOSIS — E785 Hyperlipidemia, unspecified: Secondary | ICD-10-CM | POA: Diagnosis not present

## 2023-02-17 DIAGNOSIS — D649 Anemia, unspecified: Secondary | ICD-10-CM | POA: Diagnosis not present

## 2023-02-17 DIAGNOSIS — F17210 Nicotine dependence, cigarettes, uncomplicated: Secondary | ICD-10-CM | POA: Diagnosis not present

## 2023-02-17 DIAGNOSIS — I2510 Atherosclerotic heart disease of native coronary artery without angina pectoris: Secondary | ICD-10-CM | POA: Diagnosis not present

## 2023-02-17 LAB — RAD ONC ARIA SESSION SUMMARY
Course Elapsed Days: 37
Plan Fractions Treated to Date: 3
Plan Prescribed Dose Per Fraction: 1.8 Gy
Plan Total Fractions Prescribed: 3
Plan Total Prescribed Dose: 5.4 Gy
Reference Point Dosage Given to Date: 5.4 Gy
Reference Point Session Dosage Given: 1.8 Gy
Session Number: 28

## 2023-02-19 ENCOUNTER — Inpatient Hospital Stay: Payer: Medicare HMO

## 2023-02-19 VITALS — BP 141/77 | HR 92 | Temp 96.7°F | Resp 20

## 2023-02-19 DIAGNOSIS — Z7984 Long term (current) use of oral hypoglycemic drugs: Secondary | ICD-10-CM | POA: Diagnosis not present

## 2023-02-19 DIAGNOSIS — Z7982 Long term (current) use of aspirin: Secondary | ICD-10-CM | POA: Insufficient documentation

## 2023-02-19 DIAGNOSIS — E119 Type 2 diabetes mellitus without complications: Secondary | ICD-10-CM | POA: Insufficient documentation

## 2023-02-19 DIAGNOSIS — I2510 Atherosclerotic heart disease of native coronary artery without angina pectoris: Secondary | ICD-10-CM | POA: Diagnosis not present

## 2023-02-19 DIAGNOSIS — D649 Anemia, unspecified: Secondary | ICD-10-CM | POA: Insufficient documentation

## 2023-02-19 DIAGNOSIS — E1165 Type 2 diabetes mellitus with hyperglycemia: Secondary | ICD-10-CM | POA: Diagnosis not present

## 2023-02-19 DIAGNOSIS — Z79899 Other long term (current) drug therapy: Secondary | ICD-10-CM | POA: Insufficient documentation

## 2023-02-19 DIAGNOSIS — R5383 Other fatigue: Secondary | ICD-10-CM | POA: Insufficient documentation

## 2023-02-19 DIAGNOSIS — Z87442 Personal history of urinary calculi: Secondary | ICD-10-CM | POA: Insufficient documentation

## 2023-02-19 DIAGNOSIS — E860 Dehydration: Secondary | ICD-10-CM | POA: Insufficient documentation

## 2023-02-19 DIAGNOSIS — K219 Gastro-esophageal reflux disease without esophagitis: Secondary | ICD-10-CM | POA: Diagnosis not present

## 2023-02-19 DIAGNOSIS — N289 Disorder of kidney and ureter, unspecified: Secondary | ICD-10-CM | POA: Diagnosis not present

## 2023-02-19 DIAGNOSIS — F17210 Nicotine dependence, cigarettes, uncomplicated: Secondary | ICD-10-CM | POA: Insufficient documentation

## 2023-02-19 DIAGNOSIS — I10 Essential (primary) hypertension: Secondary | ICD-10-CM | POA: Diagnosis not present

## 2023-02-19 DIAGNOSIS — C20 Malignant neoplasm of rectum: Secondary | ICD-10-CM | POA: Diagnosis not present

## 2023-02-19 DIAGNOSIS — E785 Hyperlipidemia, unspecified: Secondary | ICD-10-CM | POA: Diagnosis not present

## 2023-02-19 DIAGNOSIS — R630 Anorexia: Secondary | ICD-10-CM | POA: Diagnosis not present

## 2023-02-19 MED ORDER — HEPARIN SOD (PORK) LOCK FLUSH 100 UNIT/ML IV SOLN
500.0000 [IU] | Freq: Once | INTRAVENOUS | Status: AC | PRN
  Administered 2023-02-19: 500 [IU]
  Filled 2023-02-19: qty 5

## 2023-02-19 MED ORDER — SODIUM CHLORIDE 0.9% FLUSH
10.0000 mL | Freq: Once | INTRAVENOUS | Status: AC | PRN
  Administered 2023-02-19: 10 mL
  Filled 2023-02-19: qty 10

## 2023-02-19 MED ORDER — SODIUM CHLORIDE 0.9 % IV SOLN
Freq: Once | INTRAVENOUS | Status: AC
  Filled 2023-02-19: qty 250

## 2023-02-25 DIAGNOSIS — Z933 Colostomy status: Secondary | ICD-10-CM | POA: Diagnosis not present

## 2023-02-25 DIAGNOSIS — Z452 Encounter for adjustment and management of vascular access device: Secondary | ICD-10-CM | POA: Diagnosis not present

## 2023-03-01 ENCOUNTER — Ambulatory Visit: Payer: Self-pay | Admitting: Surgery

## 2023-03-01 DIAGNOSIS — Z720 Tobacco use: Secondary | ICD-10-CM | POA: Diagnosis not present

## 2023-03-01 DIAGNOSIS — Z933 Colostomy status: Secondary | ICD-10-CM | POA: Diagnosis not present

## 2023-03-01 DIAGNOSIS — C20 Malignant neoplasm of rectum: Secondary | ICD-10-CM | POA: Diagnosis not present

## 2023-03-02 ENCOUNTER — Telehealth: Payer: Self-pay | Admitting: Cardiovascular Disease

## 2023-03-02 NOTE — Telephone Encounter (Signed)
   Pre-operative Risk Assessment    Patient Name: [name] [name]  DOB: 1946-05-27 MRN: [id number]      Request for Surgical Clearance    Procedure:  Rectal Cancer Surgery  Date of Surgery:  Clearance TBD                                 Surgeon:  Dr. [name] Surgeon's Group or Practice Name:  [facility] Phone number:  [phone] Fax number:  [phone]   Type of Clearance Requested:   - Medical    Type of Anesthesia:  General    Additional requests/questions:    Signed, Narda Amber   03/02/2023, 4:22 PM

## 2023-03-04 NOTE — Telephone Encounter (Signed)
   Primary Cardiologist: Julien Nordmann, MD  Chart reviewed as part of pre-operative protocol coverage. Given past medical history and time since last visit, based on ACC/AHA guidelines, [name] [name] would be at acceptable risk for the planned procedure without further cardiovascular testing.   Patient was advised that if he develops new symptoms prior to surgery to contact our office to arrange a follow-up appointment.  He verbalized understanding.  I will route this recommendation to the requesting party via Epic fax function and remove from pre-op pool.  Please call with questions.  Levi Aland, NP-C  03/04/2023, 8:17 AM [address] Office [phone] Fax [phone]

## 2023-03-09 ENCOUNTER — Other Ambulatory Visit: Payer: Self-pay | Admitting: Urology

## 2023-03-12 ENCOUNTER — Other Ambulatory Visit: Payer: Self-pay

## 2023-03-12 DIAGNOSIS — C20 Malignant neoplasm of rectum: Secondary | ICD-10-CM

## 2023-03-15 ENCOUNTER — Inpatient Hospital Stay (HOSPITAL_BASED_OUTPATIENT_CLINIC_OR_DEPARTMENT_OTHER): Payer: Medicare HMO | Admitting: Oncology

## 2023-03-15 ENCOUNTER — Encounter: Payer: Self-pay | Admitting: Oncology

## 2023-03-15 ENCOUNTER — Inpatient Hospital Stay: Payer: Medicare HMO

## 2023-03-15 DIAGNOSIS — C20 Malignant neoplasm of rectum: Secondary | ICD-10-CM | POA: Diagnosis not present

## 2023-03-15 DIAGNOSIS — Z79899 Other long term (current) drug therapy: Secondary | ICD-10-CM | POA: Diagnosis not present

## 2023-03-15 DIAGNOSIS — N289 Disorder of kidney and ureter, unspecified: Secondary | ICD-10-CM | POA: Diagnosis not present

## 2023-03-15 DIAGNOSIS — E1165 Type 2 diabetes mellitus with hyperglycemia: Secondary | ICD-10-CM | POA: Diagnosis not present

## 2023-03-15 DIAGNOSIS — D649 Anemia, unspecified: Secondary | ICD-10-CM | POA: Diagnosis not present

## 2023-03-15 DIAGNOSIS — R630 Anorexia: Secondary | ICD-10-CM | POA: Diagnosis not present

## 2023-03-15 DIAGNOSIS — I2510 Atherosclerotic heart disease of native coronary artery without angina pectoris: Secondary | ICD-10-CM | POA: Diagnosis not present

## 2023-03-15 DIAGNOSIS — E860 Dehydration: Secondary | ICD-10-CM | POA: Diagnosis not present

## 2023-03-15 DIAGNOSIS — R5383 Other fatigue: Secondary | ICD-10-CM | POA: Diagnosis not present

## 2023-03-15 LAB — CMP (CANCER CENTER ONLY)
ALT: 31 U/L (ref 0–44)
AST: 30 U/L (ref 15–41)
Albumin: 3.1 g/dL — ABNORMAL LOW (ref 3.5–5.0)
Alkaline Phosphatase: 89 U/L (ref 38–126)
Anion gap: 8 (ref 5–15)
BUN: 25 mg/dL — ABNORMAL HIGH (ref 8–23)
CO2: 24 mmol/L (ref 22–32)
Calcium: 8.9 mg/dL (ref 8.9–10.3)
Chloride: 102 mmol/L (ref 98–111)
Creatinine: 1.08 mg/dL (ref 0.61–1.24)
GFR, Estimated: >60 mL/min (ref >60)
Glucose, Bld: 239 mg/dL — ABNORMAL HIGH (ref 70–99)
Potassium: 4 mmol/L (ref 3.5–5.1)
Sodium: 134 mmol/L — ABNORMAL LOW (ref 135–145)
Total Bilirubin: 0.4 mg/dL (ref 0.3–1.2)
Total Protein: 6.1 g/dL — ABNORMAL LOW (ref 6.5–8.1)

## 2023-03-15 LAB — CBC WITH DIFFERENTIAL (CANCER CENTER ONLY)
Abs Immature Granulocytes: 0.02 10*3/uL (ref 0.00–0.07)
Basophils Absolute: 0 10*3/uL (ref 0.0–0.1)
Basophils Relative: 0 %
Eosinophils Absolute: 0.2 10*3/uL (ref 0.0–0.5)
Eosinophils Relative: 3 %
HCT: 32.6 % — ABNORMAL LOW (ref 39.0–52.0)
Hemoglobin: 10.3 g/dL — ABNORMAL LOW (ref 13.0–17.0)
Immature Granulocytes: 0 %
Lymphocytes Relative: 5 %
Lymphs Abs: 0.3 10*3/uL — ABNORMAL LOW (ref 0.7–4.0)
MCH: 31.5 pg (ref 26.0–34.0)
MCHC: 31.6 g/dL (ref 30.0–36.0)
MCV: 99.7 fL (ref 80.0–100.0)
Monocytes Absolute: 0.5 10*3/uL (ref 0.1–1.0)
Monocytes Relative: 8 %
Neutro Abs: 4.7 10*3/uL (ref 1.7–7.7)
Neutrophils Relative %: 84 %
Platelet Count: 130 10*3/uL — ABNORMAL LOW (ref 150–400)
RBC: 3.27 MIL/uL — ABNORMAL LOW (ref 4.22–5.81)
RDW: 25 % — ABNORMAL HIGH (ref 11.5–15.5)
WBC Count: 5.7 10*3/uL (ref 4.0–10.5)
nRBC: 0 % (ref 0.0–0.2)

## 2023-03-15 MED ORDER — PROCHLORPERAZINE MALEATE 10 MG PO TABS
10.0000 mg | ORAL_TABLET | Freq: Four times a day (QID) | ORAL | 2 refills | Status: DC | PRN
Start: 2023-03-15 — End: 2023-05-10

## 2023-03-15 MED ORDER — ONDANSETRON HCL 8 MG PO TABS
8.0000 mg | ORAL_TABLET | Freq: Three times a day (TID) | ORAL | 2 refills | Status: DC | PRN
Start: 2023-03-15 — End: 2023-05-10

## 2023-03-15 NOTE — Progress Notes (Signed)
Appointment arranged to see Dr. Claudine Mouton 4/30 at 0945 for prolapsed stoma.

## 2023-03-15 NOTE — Progress Notes (Signed)
[facility]  Telephone:(336) [phone] Fax:(336) [phone]  ID: [name] [name] OB: 02-Feb-1946  MR#: [id number]  AOZ#:[id number]  Patient Care Team: Miki Kins, FNP as PCP - General (Family Medicine) Mariah Milling Tollie Pizza, MD as PCP - Cardiology (Cardiology) Antonieta Iba, MD as Consulting Physician (Cardiology) Benita Gutter, RN as Oncology Nurse Navigator Orlie Dakin, Tollie Pizza, MD as Consulting Physician (Oncology)  CHIEF COMPLAINT: Stage IIa rectal adenocarcinoma.  INTERVAL HISTORY: Patient returns to clinic today for repeat laboratory work and further evaluation.  He completed his treatments approximately 1 month ago.  His weakness and fatigue have essentially resolved.  He has an improved appetite and is gaining weight.  His ostomy is protruding farther into his bag, but is not causing pain.  He has no neurologic complaints.  He denies any recent fevers or illnesses.  He has no chest pain, shortness of breath, cough, or hemoptysis.  He denies any nausea, vomiting, constipation, or diarrhea.  He does not report any melena or hematochezia.  He has no urinary complaints.  Patient offers no further specific complaints today.  REVIEW OF SYSTEMS:   Review of Systems  Constitutional: Negative.  Negative for fever, malaise/fatigue and weight loss.  Respiratory: Negative.  Negative for cough, hemoptysis and shortness of breath.   Cardiovascular: Negative.  Negative for chest pain and leg swelling.  Gastrointestinal: Negative.  Negative for abdominal pain and diarrhea.  Genitourinary: Negative.  Negative for dysuria.  Musculoskeletal: Negative.  Negative for back pain.  Skin: Negative.  Negative for rash.  Neurological: Negative.  Negative for dizziness, focal weakness, weakness and headaches.  Psychiatric/Behavioral: Negative.  The patient is not nervous/anxious.     As per HPI. Otherwise, a complete review of systems is negative.  PAST MEDICAL HISTORY: Past  Medical History:  Diagnosis Date   Anemia    Anginal pain (HCC)    Bradycardia    Cancer (HCC)    Coronary artery disease    x 1 stent   Depression    Diabetes mellitus without complication (HCC)    GERD (gastroesophageal reflux disease)    History of kidney stones    Hyperlipidemia    Hypertension    Nephrolithiasis     PAST SURGICAL HISTORY: Past Surgical History:  Procedure Laterality Date   CARDIAC CATHETERIZATION     CATARACT EXTRACTION     CORONARY ANGIOPLASTY     FLEXIBLE SIGMOIDOSCOPY N/A 07/27/2022   Procedure: FLEXIBLE SIGMOIDOSCOPY;  Surgeon: Regis Bill, MD;  Location: [facility];  Service: Endoscopy;  Laterality: N/A;   KIDNEY STONE SURGERY     NECK SURGERY  12/18/2011   nephrolithiasis     PORTACATH PLACEMENT Right 08/24/2022   Procedure: INSERTION PORT-A-CATH;  Surgeon: Campbell Lerner, MD;  Location: [facility] OR[facility];  Service: General;  Laterality: Right;   stent (other)     TRANSVERSE LOOP COLOSTOMY N/A 07/28/2022   Procedure: TRANSVERSE LOOP COLOSTOMY;  Surgeon: Campbell Lerner, MD;  Location: [facility];  Service: General;  Laterality: N/A;    FAMILY HISTORY: Family History  Problem Relation Age of Onset   Heart disease Father     ADVANCED DIRECTIVES (Y/N):  N  HEALTH MAINTENANCE: Social History   Tobacco Use   Smoking status: Every Day    Packs/day: 0.50    Years: 36.00    Additional pack years: 0.00    Total pack years: 18.00    Types: Cigarettes   Smokeless tobacco: Never   Tobacco comments:    Smokes  6 cigarettes daily   Vaping Use   Vaping Use: Never used  Substance Use Topics   Alcohol use: No   Drug use: No     Colonoscopy:  PAP:  Bone density:  Lipid panel:  No Known Allergies  Current Outpatient Medications  Medication Sig Dispense Refill   aspirin 81 MG EC tablet Take 81 mg by mouth daily.       atorvastatin (LIPITOR) 40 MG tablet Take 1 tablet (40 mg total) by mouth daily. 90 tablet 3   capecitabine (XELODA)  500 MG tablet Take 3 tablets (1,500 mg total) by mouth 2 (two) times daily after a meal. Take Monday-Friday. Take only on days of radiation. 168 tablet 0   cyanocobalamin (VITAMIN B12) 500 MCG tablet Take 500 mcg by mouth daily.     diphenoxylate-atropine (LOMOTIL) 2.5-0.025 MG tablet Take 1 tablet by mouth 4 (four) times daily as needed for diarrhea or loose stools. 30 tablet 1   famotidine (PEPCID) 40 MG tablet Take 1 tablet (40 mg total) by mouth daily. 90 tablet 3   glimepiride (AMARYL) 4 MG tablet Take 4 mg by mouth daily with breakfast.     HYDROcodone-acetaminophen (NORCO/VICODIN) 5-325 MG tablet Take 1 tablet by mouth every 6 (six) hours as needed for moderate pain.     ibuprofen (ADVIL) 800 MG tablet Take 1 tablet (800 mg total) by mouth every 8 (eight) hours as needed. 30 tablet 0   isosorbide mononitrate (IMDUR) 30 MG 24 hr tablet Take 1 tablet (30 mg total) by mouth daily. 90 tablet 3   lidocaine-prilocaine (EMLA) cream Apply to affected area once 30 g 3   metFORMIN (GLUCOPHAGE) 1000 MG tablet Take 1,000 mg by mouth 2 (two) times daily with a meal.     nitroGLYCERIN (NITROSTAT) 0.4 MG SL tablet Place 1 tablet (0.4 mg total) under the tongue every 5 (five) minutes as needed for chest pain. 25 tablet 6   pioglitazone (ACTOS) 15 MG tablet Take 15 mg by mouth daily.     ondansetron (ZOFRAN) 8 MG tablet Take 1 tablet (8 mg total) by mouth every 8 (eight) hours as needed for nausea or vomiting. 60 tablet 2   prochlorperazine (COMPAZINE) 10 MG tablet Take 1 tablet (10 mg total) by mouth every 6 (six) hours as needed for nausea or vomiting. 60 tablet 2   No current facility-administered medications for this visit.    OBJECTIVE: Vitals:   03/15/23 1053  BP: 121/62  Pulse: (!) 59  Resp: 16  Temp: (!) 96.5 F (35.8 C)  SpO2: 100%      Body mass index is 24.95 kg/m.    ECOG FS:0 - Asymptomatic  General: Well-developed, well-nourished, no acute distress. Eyes: Pink conjunctiva,  anicteric sclera. HEENT: Normocephalic, moist mucous membranes. Lungs: No audible wheezing or coughing. Heart: Regular rate and rhythm. Abdomen: Soft, nontender, no obvious distention.  Ostomy bag noted.  Please see photograph with nurse navigation note. Musculoskeletal: No edema, cyanosis, or clubbing. Neuro: Alert, answering all questions appropriately. Cranial nerves grossly intact. Skin: No rashes or petechiae noted. Psych: Normal affect.   LAB RESULTS:  Lab Results  Component Value Date   NA 134 (L) 03/15/2023   K 4.0 03/15/2023   CL 102 03/15/2023   CO2 24 03/15/2023   GLUCOSE 239 (H) 03/15/2023   BUN 25 (H) 03/15/2023   CREATININE 1.08 03/15/2023   CALCIUM 8.9 03/15/2023   PROT 6.1 (L) 03/15/2023   ALBUMIN 3.1 (L) 03/15/2023  AST 30 03/15/2023   ALT 31 03/15/2023   ALKPHOS 89 03/15/2023   BILITOT 0.4 03/15/2023   GFRNONAA >60 03/15/2023   GFRAA >60 12/20/2014    Lab Results  Component Value Date   WBC 5.7 03/15/2023   NEUTROABS 4.7 03/15/2023   HGB 10.3 (L) 03/15/2023   HCT 32.6 (L) 03/15/2023   MCV 99.7 03/15/2023   PLT 130 (L) 03/15/2023     STUDIES: No results found.  ASSESSMENT: Stage IIa rectal adenocarcinoma.  PLAN:    Stage IIa rectal adenocarcinoma: Diagnosis confirmed by imaging and biopsy.  MRI and CT scan results reviewed independently and reported as above confirming stage of disease.  Patient completed neoadjuvant FOLFOX on December 30, 2022 and neoadjuvant Xeloda/XRT on February 17, 2023.  His surgery is scheduled for May 06, 2023.  Return to clinic 2 to 3 weeks after surgery for further evaluation. Anemia: Hemoglobin has trended down to 10.3, monitor. Thrombocytopenia: Mild, monitor.   Hyperglycemia: Chronic and unchanged.  Continue monitoring and treatment per primary care. Renal insufficiency: Resolved. Ostomy: Patient was given a referral back to general surgery for further evaluation.  Patient expressed understanding and was in  agreement with this plan. He also understands that He can call clinic at any time with any questions, concerns, or complaints.    Cancer Staging  Rectal adenocarcinoma [facility]) Staging form: Colon and Rectum, AJCC 8th Edition - Clinical stage from 08/13/2022: Stage IIA (cT3, cN0, cM0) - Signed by Jeralyn Ruths, MD on 08/13/2022 Total positive nodes: 0  Jeralyn Ruths, MD   03/15/2023 12:56 PM

## 2023-03-15 NOTE — Progress Notes (Signed)
Has started noticing some swelling in ankles and feet. Noticed it a week ago. Does have some SOB.

## 2023-03-16 ENCOUNTER — Encounter: Payer: Self-pay | Admitting: Surgery

## 2023-03-16 ENCOUNTER — Ambulatory Visit: Payer: Medicare HMO | Admitting: Surgery

## 2023-03-16 VITALS — BP 147/67 | HR 65 | Temp 98.1°F | Ht 72.0 in | Wt 182.4 lb

## 2023-03-16 DIAGNOSIS — K9409 Other complications of colostomy: Secondary | ICD-10-CM | POA: Diagnosis not present

## 2023-03-16 DIAGNOSIS — K435 Parastomal hernia without obstruction or  gangrene: Secondary | ICD-10-CM | POA: Diagnosis not present

## 2023-03-16 NOTE — Patient Instructions (Signed)
If you have any concerns or questions, please feel free to call our office.   Colostomy Home Guide, Adult  A colostomy is a surgically created opening (ostomy) that brings a piece of the large intestine through an opening in the abdomen to create a stoma. The stoma allows stool (feces) and gas (flatus) to leave the body. A stoma may be a variety of shapes and sizes. An ostomy pouch or bag is used to cover the stoma and to contain the stool and gas. A colostomy may be temporary or permanent, depending on the medical reason for your surgery. After surgery, you will need to learn to care for your ostomy. This includes emptying and changing the colostomy bag as needed. There are many different types of bags or pouching options, including one-piece, two-piece, clear, fabric covered, flat or curved (convex), drainable, and closed or vented options. Your health care provider will help you select the best pouch for you. How to care for the stoma Your stoma should look pink, red, and moist, like the inside of your cheek. It should not be painful to touch, but it may bleed easily when rubbed or bumped. Soon after surgery, the stoma may be swollen, but this goes away within 6 weeks. To care for the stoma: Keep the skin around the stoma clean and dry. Use a clean, soft washcloth to gently wash the stoma and the skin around it. Clean using a circular motion, and wipe away from the stoma opening, nottoward it. Use warm water and only use cleansers, such as mild soap and stoma-safe adhesive remover, recommended by your health care provider. Inspect and dry the skin around the stoma. Use stoma powder or skin protectant on your skin as told by your health care provider. Do not use any other powders, gels, wipes, or creams on the skin around the stoma. These may keep pouch adhesive from sticking or may cause injury to your stoma. Check the stoma area every day for signs of infection or problems. Check for: New or  worsening redness, warmth, swelling, irritation, itching, or pain around the stoma. New or worsening changes to the stoma, such as bleeding, trauma, or a dark and dusky color. Changes to the drainage from the stoma, such as pus, blood, a large amount of liquid drainage, or little to no stool drainage. Measure the stoma opening regularly and record the size. Watch for changes, such as the stoma getting longer, becoming flat, or falling below skin level. (It is normal for the stoma to get smaller as the swelling goes away.) Share this information with your health care provider. Changes in the size or shape of the stoma may require a different style of pouch to be used. How to empty the colostomy bag     Empty your bag at bedtime, before physical activity or sexual relations, and whenever it is one-third to one-half full. Do not let the bag get more than half-full with stool or gas. The bag could leak if it gets too full. Some colostomy bags have a built-in gas release valve that releases gas often throughout the day. Follow these basic steps for emptying a drainable pouch: Prepare the toilet or container: If draining directly into the toilet, put several pieces of toilet paper into the toilet water. This will prevent splashing as you empty the stool into the toilet. Sit far back on the toilet seat. If draining into a container, place a few pieces of moistened toilet paper in the bottom of the   container. This can help when emptying the container. Remove the clip or the hook-and-loop fastener from the tail end of the bag. Unroll the tail, then empty the stool into the toilet or container. Clean the tail with toilet paper or a moist towelette. Reroll the tail, and close it with the clip or the hook-and-loop fastener. Wash your hands. How to change the colostomy bag Change your bag every 3-4 days or as often as told by your health care provider. Also change the bag if it is leaking or separating from  the skin, or if your skin around the stoma looks or feels irritated. Irritated skin may be a sign that the bag is leaking. Always have colostomy supplies with you, and follow these basic steps: Have paper towels or tissues and a trash bag nearby to clean any discharge and dispose of the soiled pouch. Remove the old pouching appliance. Use your fingers, a warm, moist cloth, or a stoma-safe adhesive remover to gently remove the pouch and remove adhesive residue. Clean the stoma area with water or with mild soap and water, as directed. Use water to rinse away any soap. Dry the skin. You may use the cool setting on a hair dryer to do this. Use a tracing pattern (template) to cut the skin barrier to the size needed. The opening should be large enough to fit around the stoma, but you should not see exposed skin. If you are using a two-piece bag, attach the bag and the base to each other. Add the barrier ring, if you use one. If directed, apply stoma powder or skin protectant to the skin. Warm the pouch base with your hands or blow with a hair dryer for 5-10 seconds. Remove the paper from the adhesive backing of the pouch base. Press the adhesive backing onto the skin around the stoma. Gently rub the pouch base onto the skin. This creates heat that helps the barrier to stick. Apply barrier strips to the edges of the pouch, if desired. Dispose of the soiled pouch, and wash your hands. General recommendations Avoid wearing tight clothes or having anything press directly on your stoma or bag. Change your clothing whenever it is soiled or damp. You may shower or bathe with the bag on or off. Do not use harsh or oily soaps or lotions. Dry the skin and bag after bathing. Do not shower or bathe right after changing the pouch. Wait 4-5 hours. Store all supplies in a cool, dry place. Do not leave supplies in extreme heat because some parts can melt or not stick as well. Whenever you leave home, take extra  clothing and an extra ostomy pouch with you. If your bag gets wet, you can dry it with a hair dryer on the cool setting. To prevent odor, you may put drops of ostomy deodorizer in the bag. If recommended by your health care provider, put ostomy lubricant inside the bag. This helps stool slide out of the bag more easily and completely. Contact a health care provider if: You have new or worsening redness, warmth, swelling, irritation, itching, or pain around the stoma. You have new or worsening changes to the stoma, such as bleeding, trauma, or a dark and dusky color. There are changes to the drainage from the stoma, such as pus, blood, a large amount of liquid drainage, or little to no stool drainage. Your stoma extends in or out farther than normal. You need to change your bag every day or have frequent leaks. You   have a fever. Get help right away if: Your stool is bloody. You have nausea or you vomit. You have trouble breathing. These symptoms may be an emergency. Get help right away. Call 911. Do not wait to see if the symptoms will go away. Do not drive yourself to the hospital. Summary Measure your stoma opening regularly and record the size. Watch for changes. Empty your bag at bedtime, before physical activity or sexual relations, and whenever it is one-third to one-half full. Do not let the bag get more than half-full with stool or gas. Change your bag every 3-4 days or as often as told by your health care provider. Whenever you leave home, take extra clothing and an extra ostomy pouch with you. This information is not intended to replace advice given to you by your health care provider. Make sure you discuss any questions you have with your health care provider. Document Revised: 11/12/2021 Document Reviewed: 11/12/2021 Elsevier Patient Education  2023 [employer]  

## 2023-03-16 NOTE — Progress Notes (Signed)
Patient ID: [name] [name], male   DOB: Feb 27, 1946, 77 y.o.   MRN: [id number]  Chief Complaint: Colostomy prolapse  History of Present Illness [name] [name] is a 77 y.o. male with the recent development of colostomy prolapse.  Presented in follow-up at our oncology group, noting the prolapsed stoma, and edema of the colon.  Likely brought about through some significant activity with him getting involved in more exertion than he should be participating in at this time.  He has a scheduled colostomy takedown for the third week in June.  He denies any nausea, vomiting.  He has no history of fever or chills.  He reports good bowel function that varies between soft and liquidy to more pasty.  He denies seeing any blood in his stools.  He denies any pain to the stoma site or difficulty with application of the appliance.  He has never attempted to self reduce it.  Past Medical History Past Medical History:  Diagnosis Date   Anemia    Anginal pain (HCC)    Bradycardia    Cancer (HCC)    Coronary artery disease    x 1 stent   Depression    Diabetes mellitus without complication (HCC)    GERD (gastroesophageal reflux disease)    History of kidney stones    Hyperlipidemia    Hypertension    Nephrolithiasis       Past Surgical History:  Procedure Laterality Date   CARDIAC CATHETERIZATION     CATARACT EXTRACTION     CORONARY ANGIOPLASTY     FLEXIBLE SIGMOIDOSCOPY N/A 07/27/2022   Procedure: FLEXIBLE SIGMOIDOSCOPY;  Surgeon: Regis Bill, MD;  Location: [facility];  Service: Endoscopy;  Laterality: N/A;   KIDNEY STONE SURGERY     NECK SURGERY  12/18/2011   nephrolithiasis     PORTACATH PLACEMENT Right 08/24/2022   Procedure: INSERTION PORT-A-CATH;  Surgeon: Campbell Lerner, MD;  Location: [facility] OR[facility];  Service: General;  Laterality: Right;   stent (other)     TRANSVERSE LOOP COLOSTOMY N/A 07/28/2022   Procedure: TRANSVERSE LOOP COLOSTOMY;  Surgeon: Campbell Lerner, MD;   Location: [facility] OR[facility];  Service: General;  Laterality: N/A;    No Known Allergies  Current Outpatient Medications  Medication Sig Dispense Refill   aspirin 81 MG EC tablet Take 81 mg by mouth daily.       atorvastatin (LIPITOR) 40 MG tablet Take 1 tablet (40 mg total) by mouth daily. 90 tablet 3   capecitabine (XELODA) 500 MG tablet Take 3 tablets (1,500 mg total) by mouth 2 (two) times daily after a meal. Take Monday-Friday. Take only on days of radiation. 168 tablet 0   cyanocobalamin (VITAMIN B12) 500 MCG tablet Take 500 mcg by mouth daily.     diphenoxylate-atropine (LOMOTIL) 2.5-0.025 MG tablet Take 1 tablet by mouth 4 (four) times daily as needed for diarrhea or loose stools. 30 tablet 1   famotidine (PEPCID) 40 MG tablet Take 1 tablet (40 mg total) by mouth daily. 90 tablet 3   glimepiride (AMARYL) 4 MG tablet Take 4 mg by mouth daily with breakfast.     HYDROcodone-acetaminophen (NORCO/VICODIN) 5-325 MG tablet Take 1 tablet by mouth every 6 (six) hours as needed for moderate pain.     ibuprofen (ADVIL) 800 MG tablet Take 1 tablet (800 mg total) by mouth every 8 (eight) hours as needed. 30 tablet 0   isosorbide mononitrate (IMDUR) 30 MG 24 hr tablet Take 1 tablet (30 mg total) by mouth  daily. 90 tablet 3   lidocaine-prilocaine (EMLA) cream Apply to affected area once 30 g 3   metFORMIN (GLUCOPHAGE) 1000 MG tablet Take 1,000 mg by mouth 2 (two) times daily with a meal.     nitroGLYCERIN (NITROSTAT) 0.4 MG SL tablet Place 1 tablet (0.4 mg total) under the tongue every 5 (five) minutes as needed for chest pain. 25 tablet 6   ondansetron (ZOFRAN) 8 MG tablet Take 1 tablet (8 mg total) by mouth every 8 (eight) hours as needed for nausea or vomiting. 60 tablet 2   pioglitazone (ACTOS) 15 MG tablet Take 15 mg by mouth daily.     prochlorperazine (COMPAZINE) 10 MG tablet Take 1 tablet (10 mg total) by mouth every 6 (six) hours as needed for nausea or vomiting. 60 tablet 2   No current  facility-administered medications for this visit.    Family History Family History  Problem Relation Age of Onset   Heart disease Father       Social History Social History   Tobacco Use   Smoking status: Every Day    Packs/day: 0.50    Years: 36.00    Additional pack years: 0.00    Total pack years: 18.00    Types: Cigarettes   Smokeless tobacco: Never   Tobacco comments:    Smokes 6 cigarettes daily   Vaping Use   Vaping Use: Never used  Substance Use Topics   Alcohol use: No   Drug use: No        Review of Systems  All other systems reviewed and are negative.    Physical Exam Blood pressure (!) 147/67, pulse 65, temperature 98.1 F (36.7 C), temperature source Oral, height 6' (1.829 m), weight 182 lb 6.4 oz (82.7 kg), SpO2 97 %. Last Weight  Most recent update: 03/16/2023  9:31 AM    Weight  82.7 kg (182 lb 6.4 oz)             CONSTITUTIONAL: Well developed, and nourished, appropriately responsive and aware without distress.   EYES: Sclera non-icteric.   EARS, NOSE, MOUTH AND THROAT:  The oropharynx is clear. Oral mucosa is pink and moist.    Hearing is intact to voice.  NECK: Trachea is midline, and there is no jugular venous distension.  LYMPH NODES:  Lymph nodes in the neck are not appreciated. RESPIRATORY:   Normal respiratory effort without pathologic use of accessory muscles.  Port-A-Cath in place. CARDIOVASCULAR:  Well perfused.  GI: The abdomen is left-sided ostomy is prolapsed approximately 10 to 12 cm, with associated edema likely parastomal hernia.  With direct flat soft pressure to the stoma toward the ostomy site, it regressed and easily reduced to flush without causing any significant pain or tenderness to the patient.  There is some degree protrusion back to its normal appearance when he arises from a recumbent position.  He tolerated the reduction without issue, I explained with him how to go about it should he desire to pursue the same.  And  advised avoidance of heavy lifting or straining if he is able.  His abdomen is otherwise soft, nontender, and nondistended. There were no palpable masses.  I did not appreciate hepatosplenomegaly.  MUSCULOSKELETAL:  Symmetrical muscle tone appreciated in all four extremities.    SKIN: Skin turgor is normal. No pathologic skin lesions appreciated.  NEUROLOGIC:  Motor and sensation appear grossly normal.  Cranial nerves are grossly without defect. PSYCH:  Alert and oriented to person, place and time.  Affect is appropriate for situation.  Data Reviewed I have personally reviewed what is currently available of the patient's imaging, recent labs and medical records.   Labs:     Latest Ref Rng & Units 03/15/2023   10:48 AM 02/15/2023   10:53 AM 02/03/2023   10:35 AM  CBC  WBC 4.0 - 10.5 K/uL 5.7  5.4  4.8   Hemoglobin 13.0 - 17.0 g/dL 16.1  09.6  04.5   Hematocrit 39.0 - 52.0 % 32.6  38.7  37.3   Platelets 150 - 400 K/uL 130  161        Latest Ref Rng & Units 03/15/2023   10:48 AM 02/15/2023   10:53 AM 02/03/2023   10:35 AM  CMP  Glucose 70 - 99 mg/dL 409  811  914   BUN 8 - 23 mg/dL 25  32  17   Creatinine 0.61 - 1.24 mg/dL 7.82  9.56  2.13   Sodium 135 - 145 mmol/L 134  133  142   Potassium 3.5 - 5.1 mmol/L 4.0  4.0  4.0   Chloride 98 - 111 mmol/L 102  100  103   CO2 22 - 32 mmol/L 24  22  22    Calcium 8.9 - 10.3 mg/dL 8.9  9.1  9.2   Total Protein 6.5 - 8.1 g/dL 6.1  6.7  6.4   Total Bilirubin 0.3 - 1.2 mg/dL 0.4  1.0  0.6   Alkaline Phos 38 - 126 U/L 89  72  106   AST 15 - 41 U/L 30  37  36   ALT 0 - 44 U/L 31  40  42       Imaging: Radiological images reviewed:   Within last 24 hrs: No results found.  Assessment    Colostomy prolapse Patient Active Problem List   Diagnosis Date Noted   Colostomy prolapse (HCC) 03/16/2023   Parastomal hernia without obstruction or gangrene 03/16/2023   Irritant contact dermatitis associated with fecal stoma    Colostomy complication  (HCC)    Rectal adenocarcinoma (HCC) 08/07/2022   Stricture of rectosigmoid junction on CT. 07/26/2022   Partial Large bowel obstruction (HCC)    Type 2 diabetes mellitus with diabetic polyneuropathy, without long-term current use of insulin (HCC) 02/06/2020   Back pain 12/04/2014   Hyperlipidemia associated with type 2 diabetes mellitus (HCC) 04/04/2010   Tobacco abuse 04/04/2010   Hypertension associated with diabetes (HCC) 04/04/2010   Coronary artery disease 04/04/2010    Plan    I showed him how easily it could be reduced, and he is in no pain or distress or has had no evidence of dysfunction as a result of it.  We discussed his bowel habits and regimen utilizing MiraLAX or even fiber with good hydration to maintain some form of regularity.  We discussed avoiding any kind of straining or bearing down.  We also encouraged him that we are readily available to assist him with any formal reduction, and what to watch for in terms of concerns that damage or injury is occurring.  I believe he is well versed and acquainted with what to do in the interim leading to his next operation.  We're glad to help this pleasant gentleman.  Face-to-face time spent with the patient and accompanying care providers(if present) was 20 minutes, with more than 50% of the time spent counseling, educating, and coordinating care of the patient.    These notes generated with voice recognition software. I  apologize for typographical errors.  Campbell Lerner M.D., FACS 03/16/2023, 12:03 PM

## 2023-03-18 ENCOUNTER — Ambulatory Visit
Admission: RE | Admit: 2023-03-18 | Discharge: 2023-03-18 | Disposition: A | Discharge status: Home / Self Care | Payer: Medicare HMO | Source: Ambulatory Visit | Attending: Radiation Oncology | Admitting: Radiation Oncology

## 2023-03-18 ENCOUNTER — Encounter: Payer: Self-pay | Admitting: Radiation Oncology

## 2023-03-18 VITALS — BP 162/84 | HR 71 | Temp 98.2°F | Resp 20 | Wt 182.0 lb

## 2023-03-18 DIAGNOSIS — C20 Malignant neoplasm of rectum: Secondary | ICD-10-CM | POA: Insufficient documentation

## 2023-03-18 NOTE — Progress Notes (Signed)
Radiation Oncology Follow up Note  Name: [name] [name]   Date:   03/18/2023 MRN:  [id number] DOB: 01-Mar-1946    This 77 y.o. male presents to the clinic today for 1 month follow-up status post adjuvant radiation therapy in a patient status post neoadjuvant FOLFOX chemotherapy for stage IIa (T3a N0 M0) adenocarcinoma the rectum.  REFERRING PROVIDER: Miki Kins, FNP  HPI: Patient is a 77 year old male.  Who completed FOLFOX chemotherapy as well as concurrent radiation of the pelvis with oral Xeloda for stage IIa adenocarcinoma the rectum.  Patient does have a diverting colostomy.  He is doing fairly well.  He has some protrusion of his colostomy although they are planning on doing a revision in June at this time no further surgical procedure to his colostomy is being offered.  He is having no lower urinary tract symptoms.  Colostomy is functioning well.  COMPLICATIONS OF TREATMENT: none  FOLLOW UP COMPLIANCE: keeps appointments   PHYSICAL EXAM:  BP (!) 162/84   Pulse 71   Temp 98.2 F (36.8 C) (Tympanic)   Resp 20   Wt 182 lb (82.6 kg)   BMI 24.68 kg/m  Patient colostomy has some protrusion into the bag although appears to be functioning well.  Well-developed well-nourished patient in NAD. HEENT reveals PERLA, EOMI, discs not visualized.  Oral cavity is clear. No oral mucosal lesions are identified. Neck is clear without evidence of cervical or supraclavicular adenopathy. Lungs are clear to A&P. Cardiac examination is essentially unremarkable with regular rate and rhythm without murmur rub or thrill. Abdomen is benign with no organomegaly or masses noted. Motor sensory and DTR levels are equal and symmetric in the upper and lower extremities. Cranial nerves II through XII are grossly intact. Proprioception is intact. No peripheral adenopathy or edema is identified. No motor or sensory levels are noted. Crude visual fields are within normal range.  RADIOLOGY RESULTS: No current  films for review  PLAN: At the present time patient is doing well very low side effect profile.  He is being planned to have a revision of his colostomy in the near future.  I have asked to see him back in 4 to 5 months for follow-up.  He continues follow-up care with medical oncology.  Patient is to call with any concerns.  I would like to take this opportunity to thank you for allowing me to participate in the care of your patient.Carmina Miller, MD

## 2023-03-19 ENCOUNTER — Other Ambulatory Visit: Payer: Self-pay

## 2023-03-22 DIAGNOSIS — H43393 Other vitreous opacities, bilateral: Secondary | ICD-10-CM | POA: Diagnosis not present

## 2023-03-22 DIAGNOSIS — Z961 Presence of intraocular lens: Secondary | ICD-10-CM | POA: Diagnosis not present

## 2023-03-22 DIAGNOSIS — E119 Type 2 diabetes mellitus without complications: Secondary | ICD-10-CM | POA: Diagnosis not present

## 2023-04-21 NOTE — Progress Notes (Addendum)
COVID Vaccine Completed:  Date of COVID positive in last 90 days:  PCP - Grayling Congress, FNP Cardiologist - Julien Nordmann, MD  Chest x-ray - 08-24-22 Epic EKG - 02-08-23 Epic Stress Test -  ECHO -  Cardiac Cath -  Pacemaker/ICD device last checked: Spinal Cord Stimulator:  Bowel Prep -   Sleep Study -  CPAP -   Fasting Blood Sugar -  Checks Blood Sugar _____ times a day  Last dose of GLP1 agonist-  N/A GLP1 instructions:  N/A   Last dose of SGLT-2 inhibitors-  N/A SGLT-2 instructions: N/A   Blood Thinner Instructions:  Time Aspirin Instructions:  ASA 81 Last Dose:  Activity level:  Can go up a flight of stairs and perform activities of daily living without stopping and without symptoms of chest pain or shortness of breath.  Able to exercise without symptoms  Unable to go up a flight of stairs without symptoms of     Anesthesia review:   Patient denies shortness of breath, fever, cough and chest pain at PAT appointment  Patient verbalized understanding of instructions that were given to them at the PAT appointment. Patient was also instructed that they will need to review over the PAT instructions again at home before surgery.

## 2023-04-26 NOTE — Patient Instructions (Signed)
SURGICAL WAITING ROOM VISITATION Patients having surgery or a procedure may have no more than 2 support people in the waiting area - these visitors may rotate.    Children under the age of 33 must have an adult with them who is not the patient.  If the patient needs to stay at the hospital during part of their recovery, the visitor guidelines for inpatient rooms apply. Pre-op nurse will coordinate an appropriate time for 1 support person to accompany patient in pre-op.  This support person may not rotate.    Please refer to the [facility] website for the visitor guidelines for Inpatients (after your surgery is over and you are in a regular room).       Your procedure is scheduled on: 05-06-23   Report to [facility] Main Entrance    Report to admitting at 5:15  AM   Call this number if you have problems the morning of surgery [phone]   Follow a clear liquid diet day of prep to prevent dehydration   Do not eat food :After Midnight.   After Midnight you may have the following liquids until 4:30 AM DAY OF SURGERY  Water Non-Citrus Juices (without pulp, NO RED-Apple, White grape, White cranberry) Black Coffee (NO MILK/CREAM OR CREAMERS, sugar ok)  Clear Tea (NO MILK/CREAM OR CREAMERS, sugar ok) regular and decaf                             Plain Jell-O (NO RED)                                           Fruit ices (not with fruit pulp, NO RED)                                     Popsicles (NO RED)                                                               Sports drinks like Gatorade (NO RED)    Drink 2 Pre-Surgery G2 the night before surgery                    The day of surgery:  Drink ONE (1) Pre-Surgery G2 at 4:30 AM the morning of surgery. Drink in one sitting. Do not sip.  This drink was given to you during your hospital  pre-op appointment visit. Nothing else to drink after completing the Pre-Surgery G2.          If you have questions, please contact  your surgeon's office.   FOLLOW BOWEL PREP AND ANY ADDITIONAL PRE OP INSTRUCTIONS YOU RECEIVED FROM YOUR SURGEON'S OFFICE!!! -Clear liquids day of prep to prevent dehydration  - Bisacodyl (Dulcolax) 20 mg - Give with water the day prior to surgery.   - Miralax 255g - Mix with 64 oz Gatorade/Powerade.  Drink gradually over the next few hours (8 oz glass every 15-30 minutes) until gone the day prior to surgery.   -Metronidazole (Flagyl) 1000 mg - At 2 pm,  3 pm and 10 pm after Miralax bowel prep the day prior to surgery.       -Neomycin 1000 mg - At 2 pm, 3 pm and 10 pm after Miralax  bowel prep the day prior to surgery.      Oral Hygiene is also important to reduce your risk of infection.                                    Remember - BRUSH YOUR TEETH THE MORNING OF SURGERY WITH YOUR REGULAR TOOTHPASTE   Do NOT smoke after Midnight   Take these medicines the morning of surgery with A SIP OF WATER:   Atorvastatin  Isosorbide  Pepcid  Nausea medicine if needed    How to Manage Your Diabetes Before and After Surgery  Why is it important to control my blood sugar before and after surgery? Improving blood sugar levels before and after surgery helps healing and can limit problems. A way of improving blood sugar control is eating a healthy diet by:  Eating less sugar and carbohydrates  Increasing activity/exercise  Talking with your doctor about reaching your blood sugar goals High blood sugars (greater than 180 mg/dL) can raise your risk of infections and slow your recovery, so you will need to focus on controlling your diabetes during the weeks before surgery. Make sure that the doctor who takes care of your diabetes knows about your planned surgery including the date and location.  How do I manage my blood sugar before surgery? Check your blood sugar at least 4 times a day, starting 2 days before surgery, to make sure that the level is not too high or low. Check your blood sugar the  morning of your surgery when you wake up and every 2 hours until you get to the Short Stay unit. If your blood sugar is less than 70 mg/dL, you will need to treat for low blood sugar: Do not take insulin. Treat a low blood sugar (less than 70 mg/dL) with  cup of clear juice (cranberry or apple), 4 glucose tablets, OR glucose gel. Recheck blood sugar in 15 minutes after treatment (to make sure it is greater than 70 mg/dL). If your blood sugar is not greater than 70 mg/dL on recheck, call [phone] for further instructions. Report your blood sugar to the short stay nurse when you get to Short Stay.  If you are admitted to the hospital after surgery: Your blood sugar will be checked by the staff and you will probably be given insulin after surgery (instead of oral diabetes medicines) to make sure you have good blood sugar levels. The goal for blood sugar control after surgery is 80-180 mg/dL.   WHAT DO I DO ABOUT MY DIABETES MEDICATION?  Do not take oral diabetes medicines (pills) the morning of surgery.  Do not take an evening dose of Glimpiride the night before surgery   DO NOT TAKE THE FOLLOWING 7 DAYS PRIOR TO SURGERY: Ozempic, Wegovy, Rybelsus (Semaglutide), Byetta (exenatide), Bydureon (exenatide ER), Victoza, Saxenda (liraglutide), or Trulicity (dulaglutide) Mounjaro (Tirzepatide) Adlyxin (Lixisenatide), Polyethylene Glycol Loxenatide.  If your CBG is greater than 220 mg/dL, you may take  of your sliding scale  (correction) dose of insulin.    Reviewed and Endorsed by [facility] Patient Education Committee, August 2015  You may not have any metal on your body including  jewelry, and body piercing             Do not wear lotions, powders cologne, or deodorant              Men may shave face and neck.   Do not bring valuables to the hospital. [employer] IS NOT RESPONSIBLE   FOR VALUABLES.   Contacts, dentures or bridgework may not be worn into  surgery.   Bring small overnight bag day of surgery.   DO NOT BRING YOUR HOME MEDICATIONS TO THE HOSPITAL. PHARMACY WILL DISPENSE MEDICATIONS LISTED ON YOUR MEDICATION LIST TO YOU DURING YOUR ADMISSION IN THE HOSPITAL!    Please read over the following fact sheets you were given: IF YOU HAVE QUESTIONS ABOUT YOUR PRE-OP INSTRUCTIONS PLEASE CALL [phone] G[facility]  If you received a COVID test during your pre-op visit  it is requested that you wear a mask when out in public, stay away from anyone that may not be feeling well and notify your surgeon if you develop symptoms. If you test positive for Covid or have been in contact with anyone that has tested positive in the last 10 days please notify you surgeon.  [facility] - Preparing for Surgery Before surgery, you can play an important role.  Because skin is not sterile, your skin needs to be as free of germs as possible.  You can reduce the number of germs on your skin by washing with CHG (chlorahexidine gluconate) soap before surgery.  CHG is an antiseptic cleaner which kills germs and bonds with the skin to continue killing germs even after washing. Please DO NOT use if you have an allergy to CHG or antibacterial soaps.  If your skin becomes reddened/irritated stop using the CHG and inform your nurse when you arrive at Short Stay. Do not shave (including legs and underarms) for at least 48 hours prior to the first CHG shower.  You may shave your face/neck.  Please follow these instructions carefully:  1.  Shower with CHG Soap the night before surgery and the  morning of surgery.  2.  If you choose to wash your hair, wash your hair first as usual with your normal  shampoo.  3.  After you shampoo, rinse your hair and body thoroughly to remove the shampoo.                             4.  Use CHG as you would any other liquid soap.  You can apply chg directly to the skin and wash.  Gently with a scrungie or clean washcloth.  5.  Apply the CHG Soap  to your body ONLY FROM THE NECK DOWN.   Do   not use on face/ open                           Wound or open sores. Avoid contact with eyes, ears mouth and   genitals (private parts).                       Wash face,  Genitals (private parts) with your normal soap.             6.  Wash thoroughly, paying special attention to the area where your    surgery  will be performed.  7.  Thoroughly  rinse your body with warm water from the neck down.  8.  DO NOT shower/wash with your normal soap after using and rinsing off the CHG Soap.                9.  Pat yourself dry with a clean towel.            10.  Wear clean pajamas.            11.  Place clean sheets on your bed the night of your first shower and do not  sleep with pets. Day of Surgery : Do not apply any lotions/deodorants the morning of surgery.  Please wear clean clothes to the hospital/surgery center.  FAILURE TO FOLLOW THESE INSTRUCTIONS MAY RESULT IN THE CANCELLATION OF YOUR SURGERY  PATIENT SIGNATURE_________________________________  NURSE SIGNATURE__________________________________  ________________________________________________________________________    Rogelia Mire  An incentive spirometer is a tool that can help keep your lungs clear and active. This tool measures how well you are filling your lungs with each breath. Taking long deep breaths may help reverse or decrease the chance of developing breathing (pulmonary) problems (especially infection) following: A long period of time when you are unable to move or be active. BEFORE THE PROCEDURE  If the spirometer includes an indicator to show your best effort, your nurse or respiratory therapist will set it to a desired goal. If possible, sit up straight or lean slightly forward. Try not to slouch. Hold the incentive spirometer in an upright position. INSTRUCTIONS FOR USE  Sit on the edge of your bed if possible, or sit up as far as you can in bed or on a chair. Hold  the incentive spirometer in an upright position. Breathe out normally. Place the mouthpiece in your mouth and seal your lips tightly around it. Breathe in slowly and as deeply as possible, raising the piston or the ball toward the top of the column. Hold your breath for 3-5 seconds or for as long as possible. Allow the piston or ball to fall to the bottom of the column. Remove the mouthpiece from your mouth and breathe out normally. Rest for a few seconds and repeat Steps 1 through 7 at least 10 times every 1-2 hours when you are awake. Take your time and take a few normal breaths between deep breaths. The spirometer may include an indicator to show your best effort. Use the indicator as a goal to work toward during each repetition. After each set of 10 deep breaths, practice coughing to be sure your lungs are clear. If you have an incision (the cut made at the time of surgery), support your incision when coughing by placing a pillow or rolled up towels firmly against it. Once you are able to get out of bed, walk around indoors and cough well. You may stop using the incentive spirometer when instructed by your caregiver.  RISKS AND COMPLICATIONS Take your time so you do not get dizzy or light-headed. If you are in pain, you may need to take or ask for pain medication before doing incentive spirometry. It is harder to take a deep breath if you are having pain. AFTER USE Rest and breathe slowly and easily. It can be helpful to keep track of a log of your progress. Your caregiver can provide you with a simple table to help with this. If you are using the spirometer at home, follow these instructions: SEEK MEDICAL CARE IF:  You are having difficultly using the spirometer. You  have trouble using the spirometer as often as instructed. Your pain medication is not giving enough relief while using the spirometer. You develop fever of 100.5 F (38.1 C) or higher. SEEK IMMEDIATE MEDICAL CARE IF:  You  cough up bloody sputum that had not been present before. You develop fever of 102 F (38.9 C) or greater. You develop worsening pain at or near the incision site. MAKE SURE YOU:  Understand these instructions. Will watch your condition. Will get help right away if you are not doing well or get worse. Document Released: 03/15/2007 Document Revised: 01/25/2012 Document Reviewed: 05/16/2007 [employer] Patient Information 2014 [employer], [place].   ________________________________________________________________________ WHAT IS A BLOOD TRANSFUSION? Blood Transfusion Information  A transfusion is the replacement of blood or some of its parts. Blood is made up of multiple cells which provide different functions. Red blood cells carry oxygen and are used for blood loss replacement. White blood cells fight against infection. Platelets control bleeding. Plasma helps clot blood. Other blood products are available for specialized needs, such as hemophilia or other clotting disorders. BEFORE THE TRANSFUSION  Who gives blood for transfusions?  Healthy volunteers who are fully evaluated to make sure their blood is safe. This is blood bank blood. Transfusion therapy is the safest it has ever been in the practice of medicine. Before blood is taken from a donor, a complete history is taken to make sure that person has no history of diseases nor engages in risky social behavior (examples are intravenous drug use or sexual activity with multiple partners). The donor's travel history is screened to minimize risk of transmitting infections, such as malaria. The donated blood is tested for signs of infectious diseases, such as HIV and hepatitis. The blood is then tested to be sure it is compatible with you in order to minimize the chance of a transfusion reaction. If you or a relative donates blood, this is often done in anticipation of surgery and is not appropriate for emergency situations. It takes many days to  process the donated blood. RISKS AND COMPLICATIONS Although transfusion therapy is very safe and saves many lives, the main dangers of transfusion include:  Getting an infectious disease. Developing a transfusion reaction. This is an allergic reaction to something in the blood you were given. Every precaution is taken to prevent this. The decision to have a blood transfusion has been considered carefully by your caregiver before blood is given. Blood is not given unless the benefits outweigh the risks. AFTER THE TRANSFUSION Right after receiving a blood transfusion, you will usually feel much better and more energetic. This is especially true if your red blood cells have gotten low (anemic). The transfusion raises the level of the red blood cells which carry oxygen, and this usually causes an energy increase. The nurse administering the transfusion will monitor you carefully for complications. HOME CARE INSTRUCTIONS  No special instructions are needed after a transfusion. You may find your energy is better. Speak with your caregiver about any limitations on activity for underlying diseases you may have. SEEK MEDICAL CARE IF:  Your condition is not improving after your transfusion. You develop redness or irritation at the intravenous (IV) site. SEEK IMMEDIATE MEDICAL CARE IF:  Any of the following symptoms occur over the next 12 hours: Shaking chills. You have a temperature by mouth above 102 F (38.9 C), not controlled by medicine. Chest, back, or muscle pain. People around you feel you are not acting correctly or are confused. Shortness of breath or  difficulty breathing. Dizziness and fainting. You get a rash or develop hives. You have a decrease in urine output. Your urine turns a dark color or changes to pink, red, or brown. Any of the following symptoms occur over the next 10 days: You have a temperature by mouth above 102 F (38.9 C), not controlled by medicine. Shortness of  breath. Weakness after normal activity. The white part of the eye turns yellow (jaundice). You have a decrease in the amount of urine or are urinating less often. Your urine turns a dark color or changes to pink, red, or brown. Document Released: 10/30/2000 Document Revised: 01/25/2012 Document Reviewed: 06/18/2008 [facility] Patient Information 2014 [place], [place].  _______________________________________________________________________

## 2023-04-28 ENCOUNTER — Encounter (HOSPITAL_COMMUNITY)
Admission: RE | Admit: 2023-04-28 | Discharge: 2023-04-28 | Disposition: A | Discharge status: Home / Self Care | Payer: Medicare HMO | Source: Ambulatory Visit | Attending: Surgery | Admitting: Surgery

## 2023-04-28 ENCOUNTER — Encounter (HOSPITAL_COMMUNITY): Payer: Self-pay

## 2023-04-28 ENCOUNTER — Encounter: Payer: Self-pay | Admitting: *Deleted

## 2023-04-28 ENCOUNTER — Other Ambulatory Visit: Payer: Self-pay

## 2023-04-28 VITALS — BP 155/78 | HR 64 | Temp 98.0°F | Resp 16 | Ht 72.0 in | Wt 183.6 lb

## 2023-04-28 DIAGNOSIS — I2510 Atherosclerotic heart disease of native coronary artery without angina pectoris: Secondary | ICD-10-CM | POA: Diagnosis not present

## 2023-04-28 DIAGNOSIS — Z01818 Encounter for other preprocedural examination: Secondary | ICD-10-CM | POA: Diagnosis not present

## 2023-04-28 DIAGNOSIS — E119 Type 2 diabetes mellitus without complications: Secondary | ICD-10-CM

## 2023-04-28 DIAGNOSIS — E785 Hyperlipidemia, unspecified: Secondary | ICD-10-CM | POA: Insufficient documentation

## 2023-04-28 DIAGNOSIS — I10 Essential (primary) hypertension: Secondary | ICD-10-CM

## 2023-04-28 DIAGNOSIS — C20 Malignant neoplasm of rectum: Secondary | ICD-10-CM | POA: Diagnosis not present

## 2023-04-28 DIAGNOSIS — K219 Gastro-esophageal reflux disease without esophagitis: Secondary | ICD-10-CM | POA: Diagnosis not present

## 2023-04-28 LAB — BASIC METABOLIC PANEL
Anion gap: 8 (ref 5–15)
BUN: 25 mg/dL — ABNORMAL HIGH (ref 8–23)
CO2: 24 mmol/L (ref 22–32)
Calcium: 9.5 mg/dL (ref 8.9–10.3)
Chloride: 108 mmol/L (ref 98–111)
Creatinine, Ser: 0.93 mg/dL (ref 0.61–1.24)
GFR, Estimated: >60 mL/min (ref >60)
Glucose, Bld: 81 mg/dL (ref 70–99)
Potassium: 4 mmol/L (ref 3.5–5.1)
Sodium: 140 mmol/L (ref 135–145)

## 2023-04-28 LAB — TYPE AND SCREEN: ABO/RH(D): A POS

## 2023-04-28 LAB — GLUCOSE, CAPILLARY: Glucose-Capillary: 87 mg/dL (ref 70–99)

## 2023-04-28 LAB — CBC
HCT: 34.4 % — ABNORMAL LOW (ref 39.0–52.0)
Hemoglobin: 11.1 g/dL — ABNORMAL LOW (ref 13.0–17.0)
MCH: 32.9 pg (ref 26.0–34.0)
MCHC: 32.3 g/dL (ref 30.0–36.0)
MCV: 102.1 fL — ABNORMAL HIGH (ref 80.0–100.0)
Platelets: 149 10*3/uL — ABNORMAL LOW (ref 150–400)
RBC: 3.37 MIL/uL — ABNORMAL LOW (ref 4.22–5.81)
RDW: 16.3 % — ABNORMAL HIGH (ref 11.5–15.5)
WBC: 8 10*3/uL (ref 4.0–10.5)
nRBC: 0 % (ref 0.0–0.2)

## 2023-04-28 LAB — HEMOGLOBIN A1C
Hgb A1c MFr Bld: 7.2 % — ABNORMAL HIGH (ref 4.8–5.6)
Mean Plasma Glucose: 159.94 mg/dL

## 2023-04-28 NOTE — Consult Note (Addendum)
WOC Nurse requested for preoperative stoma site marking. Pt currently has an ostomy stoma from 9/23 to left abd and is familiar with ostomy care.  Marking performed as requested to right abd for upcoming surgery in case moving to a new location is required. Examined patient sitting, and standing in order to place the marking in the patient's visual field, away from any creases or abdominal contour issues and within the rectus muscle.  Attempted to mark below the patient's belt line, but this was not possible since a significant crease occurs lower on the abd when the patient leans forward and should be avoided if possible.  Marked for ileostomy in the RLQ  __8__cm to the right of the umbilicus and  _2___ cm above the umbilicus. Patient's abdomen cleansed with CHG wipes at site markings, allowed to air dry prior to marking.Covered mark with thin film transparent dressing to preserve mark until date of surgery. Provided with marking pen and told to re-color in if the mark fades prior to surgery.  WOC Nurse team will follow up with patient after surgery for continued ostomy care and teaching.  Thank-you,  Cammie Mcgee MSN, RN, CWOCN, [place], CNS [phone]

## 2023-04-29 NOTE — Progress Notes (Signed)
Anesthesia Chart Review   Case: [id number] Date/Time: 05/05/23 1225   Procedure: COLONOSCOPY WITH PROPOFOL - Patient having surgery tomorrow, 05/06/2023.   Anesthesia type: General   Pre-op diagnosis: Rectal Adenocarcinoma   Location: [facility]O ROOM 2 / [facility]   Surgeons: Regis Bill, MD       DISCUSSION:77 y.o. smoker with h/o HTN, DM II, CAD (DES 2009), rectal adenocarcinoma scheduled for above procedure 05/05/2023 with Dr. Eather Colas.   Per cardiology preoperative evaluation 03/04/2023, "Chart reviewed as part of pre-operative protocol coverage. Given past medical history and time since last visit, based on ACC/AHA guidelines, Abad Edge would be at acceptable risk for the planned procedure without further cardiovascular testing. "  Anticipate pt can proceed with planned procedure barring acute status change.   VS: BP (!) 155/78   Pulse 64   Temp 36.7 C (Oral)   Resp 16   Ht 6' (1.829 m)   Wt 83.3 kg   SpO2 99%   BMI 24.90 kg/m   PROVIDERS: Miki Kins, FNP is PCP   Cardiologist - Julien Nordmann, MD  LABS: Labs reviewed: Acceptable for surgery. (all labs ordered are listed, but only abnormal results are displayed)  Labs Reviewed  HEMOGLOBIN A1C - Abnormal; Notable for the following components:      Result Value   Hgb A1c MFr Bld 7.2 (*)    All other components within normal limits  BASIC METABOLIC PANEL - Abnormal; Notable for the following components:   BUN 25 (*)    All other components within normal limits  CBC - Abnormal; Notable for the following components:   RBC 3.37 (*)    Hemoglobin 11.1 (*)    HCT 34.4 (*)    MCV 102.1 (*)    RDW 16.3 (*)    Platelets 149 (*)    All other components within normal limits  GLUCOSE, CAPILLARY  TYPE AND SCREEN     IMAGES:   EKG:   CV:  Past Medical History:  Diagnosis Date   Anemia    Anginal pain (HCC)    Bradycardia    Cancer (HCC)    Coronary artery disease    x 1 stent    Depression    Diabetes mellitus without complication (HCC)    GERD (gastroesophageal reflux disease)    History of kidney stones    Hyperlipidemia    Hypertension    Nephrolithiasis     Past Surgical History:  Procedure Laterality Date   CARDIAC CATHETERIZATION     CATARACT EXTRACTION     CORONARY ANGIOPLASTY     FLEXIBLE SIGMOIDOSCOPY N/A 07/27/2022   Procedure: FLEXIBLE SIGMOIDOSCOPY;  Surgeon: Regis Bill, MD;  Location: [facility] ENDOSCOPY;  Service: Endoscopy;  Laterality: N/A;   KIDNEY STONE SURGERY     NECK SURGERY  12/18/2011   nephrolithiasis     PORTACATH PLACEMENT Right 08/24/2022   Procedure: INSERTION PORT-A-CATH;  Surgeon: Campbell Lerner, MD;  Location: [facility] ORS;  Service: General;  Laterality: Right;   stent (other)     TRANSVERSE LOOP COLOSTOMY N/A 07/28/2022   Procedure: TRANSVERSE LOOP COLOSTOMY;  Surgeon: Campbell Lerner, MD;  Location: [facility] ORS;  Service: General;  Laterality: N/A;    MEDICATIONS:  aspirin 81 MG EC tablet   atorvastatin (LIPITOR) 40 MG tablet   capecitabine (XELODA) 500 MG tablet   cyanocobalamin (VITAMIN B12) 500 MCG tablet   diphenoxylate-atropine (LOMOTIL) 2.5-0.025 MG tablet   famotidine (PEPCID) 40 MG tablet   glimepiride (  AMARYL) 4 MG tablet   HYDROcodone-acetaminophen (NORCO/VICODIN) 5-325 MG tablet   isosorbide mononitrate (IMDUR) 30 MG 24 hr tablet   lidocaine-prilocaine (EMLA) cream   metFORMIN (GLUCOPHAGE) 1000 MG tablet   nitroGLYCERIN (NITROSTAT) 0.4 MG SL tablet   ondansetron (ZOFRAN) 8 MG tablet   pioglitazone (ACTOS) 15 MG tablet   prochlorperazine (COMPAZINE) 10 MG tablet   No current facility-administered medications for this encounter.    Jodell Cipro Ward, PA-C [facility] Pre-Surgical Testing [phone]

## 2023-04-29 NOTE — Anesthesia Preprocedure Evaluation (Addendum)
Anesthesia Evaluation  Patient identified by MRN, date of birth, ID band Patient awake    Reviewed: Allergy & Precautions, NPO status , Patient's Chart, lab work & pertinent test results  History of Anesthesia Complications Negative for: history of anesthetic complications  Airway Mallampati: III  TM Distance: >3 FB Neck ROM: full    Dental  (+) Edentulous Upper, Loose, Poor Dentition,    Pulmonary neg pulmonary ROS, Current Smoker   Pulmonary exam normal        Cardiovascular hypertension, (-) angina + CAD and + Cardiac Stents  (-) DOE Normal cardiovascular exam     Neuro/Psych  PSYCHIATRIC DISORDERS  Depression     Neuromuscular disease    GI/Hepatic Neg liver ROS,GERD  ,,  Endo/Other  negative endocrine ROSdiabetes    Renal/GU negative Renal ROS  negative genitourinary   Musculoskeletal   Abdominal   Peds  Hematology  (+) Blood dyscrasia, anemia   Anesthesia Other Findings Past Medical History: No date: Anemia No date: Anginal pain (HCC) No date: Bradycardia No date: Cancer (HCC) No date: Coronary artery disease     Comment:  x 1 stent No date: Depression No date: Diabetes mellitus without complication (HCC) No date: GERD (gastroesophageal reflux disease) No date: History of kidney stones No date: Hyperlipidemia No date: Hypertension No date: Nephrolithiasis  Past Surgical History: No date: CARDIAC CATHETERIZATION No date: CATARACT EXTRACTION No date: CORONARY ANGIOPLASTY No date: EYE SURGERY 07/27/2022: FLEXIBLE SIGMOIDOSCOPY; N/A     Comment:  Procedure: FLEXIBLE SIGMOIDOSCOPY;  Surgeon: Regis Bill, MD;  Location: [facility];  Service:               Endoscopy;  Laterality: N/A; No date: KIDNEY STONE SURGERY 12/18/2011: NECK SURGERY No date: nephrolithiasis 08/24/2022: PORTACATH PLACEMENT; Right     Comment:  Procedure: INSERTION PORT-A-CATH;  Surgeon: Campbell Lerner, MD;  Location: [facility] ORS;  Service: General;                Laterality: Right; No date: stent (other) 07/28/2022: TRANSVERSE LOOP COLOSTOMY; N/A     Comment:  Procedure: TRANSVERSE LOOP COLOSTOMY;  Surgeon:               Campbell Lerner, MD;  Location: [facility] ORS;  Service:               General;  Laterality: N/A;  BMI    Body Mass Index: 24.33 kg/m      Reproductive/Obstetrics negative OB ROS                             Anesthesia Physical Anesthesia Plan  ASA: 3  Anesthesia Plan: General   Post-op Pain Management: Minimal or no pain anticipated   Induction: Intravenous  PONV Risk Score and Plan: 1 and Propofol infusion and TIVA  Airway Management Planned: Natural Airway and Nasal Cannula  Additional Equipment:   Intra-op Plan:   Post-operative Plan:   Informed Consent: I have reviewed the patients History and Physical, chart, labs and discussed the procedure including the risks, benefits and alternatives for the proposed anesthesia with the patient or authorized representative who has indicated his/her understanding and acceptance.     Dental Advisory Given  Plan Discussed with: Anesthesiologist, CRNA and Surgeon  Anesthesia Plan Comments: (See  PAT note 04/28/2023)       Anesthesia Quick Evaluation

## 2023-05-05 ENCOUNTER — Ambulatory Visit: Payer: Medicare HMO | Admitting: Anesthesiology

## 2023-05-05 ENCOUNTER — Encounter
Admission: RE | Disposition: A | Discharge status: Home / Self Care | Payer: Self-pay | Source: Home / Self Care | Attending: Gastroenterology

## 2023-05-05 ENCOUNTER — Ambulatory Visit: Payer: Medicare HMO | Admitting: Physician Assistant

## 2023-05-05 ENCOUNTER — Ambulatory Visit
Admission: RE | Admit: 2023-05-05 | Discharge: 2023-05-05 | Disposition: A | Discharge status: Home / Self Care | Payer: Medicare HMO | Attending: Gastroenterology | Admitting: Gastroenterology

## 2023-05-05 ENCOUNTER — Encounter: Payer: Self-pay | Admitting: *Deleted

## 2023-05-05 DIAGNOSIS — Z9221 Personal history of antineoplastic chemotherapy: Secondary | ICD-10-CM | POA: Diagnosis not present

## 2023-05-05 DIAGNOSIS — Z955 Presence of coronary angioplasty implant and graft: Secondary | ICD-10-CM | POA: Diagnosis not present

## 2023-05-05 DIAGNOSIS — C19 Malignant neoplasm of rectosigmoid junction: Secondary | ICD-10-CM | POA: Insufficient documentation

## 2023-05-05 DIAGNOSIS — C20 Malignant neoplasm of rectum: Secondary | ICD-10-CM | POA: Diagnosis not present

## 2023-05-05 DIAGNOSIS — I2510 Atherosclerotic heart disease of native coronary artery without angina pectoris: Secondary | ICD-10-CM | POA: Diagnosis not present

## 2023-05-05 DIAGNOSIS — K219 Gastro-esophageal reflux disease without esophagitis: Secondary | ICD-10-CM | POA: Diagnosis not present

## 2023-05-05 DIAGNOSIS — E785 Hyperlipidemia, unspecified: Secondary | ICD-10-CM | POA: Diagnosis not present

## 2023-05-05 DIAGNOSIS — F17210 Nicotine dependence, cigarettes, uncomplicated: Secondary | ICD-10-CM | POA: Diagnosis not present

## 2023-05-05 DIAGNOSIS — Z1211 Encounter for screening for malignant neoplasm of colon: Secondary | ICD-10-CM | POA: Diagnosis not present

## 2023-05-05 DIAGNOSIS — E119 Type 2 diabetes mellitus without complications: Secondary | ICD-10-CM | POA: Diagnosis not present

## 2023-05-05 DIAGNOSIS — Z923 Personal history of irradiation: Secondary | ICD-10-CM | POA: Diagnosis not present

## 2023-05-05 DIAGNOSIS — I10 Essential (primary) hypertension: Secondary | ICD-10-CM | POA: Insufficient documentation

## 2023-05-05 DIAGNOSIS — Z85038 Personal history of other malignant neoplasm of large intestine: Secondary | ICD-10-CM | POA: Diagnosis not present

## 2023-05-05 HISTORY — PX: COLONOSCOPY WITH PROPOFOL: SHX5780

## 2023-05-05 LAB — GLUCOSE, CAPILLARY: Glucose-Capillary: 142 mg/dL — ABNORMAL HIGH (ref 70–99)

## 2023-05-05 SURGERY — COLONOSCOPY WITH PROPOFOL
Anesthesia: General

## 2023-05-05 MED ORDER — LIDOCAINE HCL (CARDIAC) PF 100 MG/5ML IV SOSY
PREFILLED_SYRINGE | INTRAVENOUS | Status: DC | PRN
  Administered 2023-05-05: 50 mg via INTRAVENOUS

## 2023-05-05 MED ORDER — PROPOFOL 10 MG/ML IV BOLUS
INTRAVENOUS | Status: DC | PRN
  Administered 2023-05-05 (×2): 40 mg via INTRAVENOUS
  Administered 2023-05-05: 80 mg via INTRAVENOUS

## 2023-05-05 MED ORDER — SODIUM CHLORIDE 0.9% FLUSH: 10.0000 mL | INTRAVENOUS | Status: DC | PRN

## 2023-05-05 MED ORDER — SODIUM CHLORIDE 0.9 % IV SOLN: INTRAVENOUS | Status: DC

## 2023-05-05 MED ORDER — HEPARIN SOD (PORK) LOCK FLUSH 100 UNIT/ML IV SOLN
500.0000 [IU] | INTRAVENOUS | Status: AC | PRN
  Administered 2023-05-05: 500 [IU]

## 2023-05-05 MED ORDER — HEPARIN SOD (PORK) LOCK FLUSH 100 UNIT/ML IV SOLN
INTRAVENOUS | Status: AC
  Filled 2023-05-05: qty 5

## 2023-05-05 NOTE — Interval H&P Note (Signed)
History and Physical Interval Note:  05/05/2023 12:43 PM  [name] [name]  has presented today for surgery, with the diagnosis of Rectal Adenocarcinoma.  The various methods of treatment have been discussed with the patient and family. After consideration of risks, benefits and other options for treatment, the patient has consented to  Procedure(s) with comments: COLONOSCOPY WITH PROPOFOL (N/A) - Patient having surgery tomorrow, 05/06/2023. Ok'd per [place] and office as a surgical intervention.  The patient's history has been reviewed, patient examined, no change in status, stable for surgery.  I have reviewed the patient's chart and labs.  Questions were answered to the patient's satisfaction.     Regis Bill  Ok to proceed with colonoscopy through stoma

## 2023-05-05 NOTE — Anesthesia Preprocedure Evaluation (Addendum)
Anesthesia Evaluation  Patient identified by MRN, date of birth, ID band Patient awake    Reviewed: Allergy & Precautions, NPO status , Patient's Chart, lab work & pertinent test results  Airway Mallampati: II  TM Distance: >3 FB Neck ROM: Full    Dental  (+) Edentulous Upper, Missing, Dental Advisory Given   Pulmonary Current Smoker   Pulmonary exam normal breath sounds clear to auscultation       Cardiovascular hypertension, Pt. on medications + angina with exertion + CAD and + Cardiac Stents  Normal cardiovascular exam Rhythm:Regular Rate:Normal  EKG 02/08/23 NSR, non specific IVCD  DES x 1 2009  Cardiology "clearance" noted   Neuro/Psych  PSYCHIATRIC DISORDERS  Depression    Peripheral neuropathy  Neuromuscular disease    GI/Hepatic Neg liver ROS,GERD  Medicated,,Rectal Ca Parastomal hernia S/P colostomy   Endo/Other  diabetes, Well Controlled, Type 2, Oral Hypoglycemic Agents  Hyperlipidemia  Renal/GU Renal diseaseNephrolithiasis     Musculoskeletal negative musculoskeletal ROS (+)    Abdominal   Peds  Hematology  (+) Blood dyscrasia, anemia   Anesthesia Other Findings   Reproductive/Obstetrics                             Anesthesia Physical Anesthesia Plan  ASA: 3  Anesthesia Plan: General   Post-op Pain Management: Tylenol PO (pre-op)* and Gabapentin PO (pre-op)*   Induction: Intravenous  PONV Risk Score and Plan: 2 and Treatment may vary due to age or medical condition, Ondansetron and Dexamethasone  Airway Management Planned: Oral ETT  Additional Equipment: Arterial line  Intra-op Plan:   Post-operative Plan: Extubation in OR  Informed Consent: I have reviewed the patients History and Physical, chart, labs and discussed the procedure including the risks, benefits and alternatives for the proposed anesthesia with the patient or authorized representative who has  indicated his/her understanding and acceptance.     Dental advisory given  Plan Discussed with: CRNA  Anesthesia Plan Comments: (2 x PIV)        Anesthesia Quick Evaluation

## 2023-05-05 NOTE — Op Note (Signed)
[facility] Patient Name: Stu Minotti Procedure Date: 05/05/2023 12:17 PM MRN: [id number] Account #: [id number] Date of Birth: [birth year] Admit Type: Outpatient Age: 77 Room: [facility] ENDO ROOM 2 Gender: Male Note Status: Finalized Instrument Name: Peds Colonoscope [id number] Procedure:             Colonoscopy Indications:           High risk colon cancer surveillance: Personal history                         of colon cancer Providers:             Eather Colas MD, MD Medicines:             Monitored Anesthesia Care Complications:         No immediate complications. Procedure:             Pre-Anesthesia Assessment:                        - Prior to the procedure, a History and Physical was                         performed, and patient medications and allergies were                         reviewed. The patient is competent. The risks and                         benefits of the procedure and the sedation options and                         risks were discussed with the patient. All questions                         were answered and informed consent was obtained.                         Patient identification and proposed procedure were                         verified by the physician, the nurse, the                         anesthesiologist, the anesthetist and the technician                         in the endoscopy suite. Mental Status Examination:                         alert and oriented. Airway Examination: normal                         oropharyngeal airway and neck mobility. Respiratory                         Examination: clear to auscultation. CV Examination:                         normal. Prophylactic Antibiotics: The patient does not  require prophylactic antibiotics. Prior                         Anticoagulants: The patient has taken no anticoagulant                         or antiplatelet agents. ASA Grade  Assessment: III - A                         patient with severe systemic disease. After reviewing                         the risks and benefits, the patient was deemed in                         satisfactory condition to undergo the procedure. The                         anesthesia plan was to use monitored anesthesia care                         (MAC). Immediately prior to administration of                         medications, the patient was re-assessed for adequacy                         to receive sedatives. The heart rate, respiratory                         rate, oxygen saturations, blood pressure, adequacy of                         pulmonary ventilation, and response to care were                         monitored throughout the procedure. The physical                         status of the patient was re-assessed after the                         procedure.                        After obtaining informed consent, the colonoscope was                         passed under direct vision. Throughout the procedure,                         the patient's blood pressure, pulse, and oxygen                         saturations were monitored continuously. The                         Colonoscope was introduced through the transverse  colostomy and advanced to the the terminal ileum. The                         colonoscopy was performed without difficulty. The                         patient tolerated the procedure well. The quality of                         the bowel preparation was good. The terminal ileum,                         the ileocecal valve and the appendiceal orifice were                         photographed. Findings:      The terminal ileum appeared normal.      The transverse colon, hepatic flexure, ascending colon and cecum       appeared normal. Impression:            - The examined portion of the ileum was normal.                        - The  transverse colon, hepatic flexure, ascending                         colon and cecum are normal.                        - No specimens collected. Recommendation:        - Discharge patient to home.                        - Per surgeon's recommendations.                        - Continue present medications.                        - Repeat colonoscopy after colon surgery for                         surveillance.                        - Return to referring physician as previously                         scheduled. Procedure Code(s):     --- Professional ---                        [phone], Colonoscopy through stoma; diagnostic,                         including collection of specimen(s) by brushing or                         washing, when performed (separate procedure) Diagnosis Code(s):     --- Professional ---  Z85.038, Personal history of other malignant neoplasm                         of large intestine CPT copyright 2022 [employer]. All rights reserved. The codes documented in this report are preliminary and upon coder review may  be revised to meet current compliance requirements. Eather Colas MD, MD 05/05/2023 1:38:18 PM Number of Addenda: 0 Note Initiated On: 05/05/2023 12:17 PM Scope Withdrawal Time: 0 hours 4 minutes 49 seconds  Total Procedure Duration: 0 hours 6 minutes 40 seconds  Estimated Blood Loss:  Estimated blood loss: none.      [facility]

## 2023-05-05 NOTE — Transfer of Care (Signed)
Immediate Anesthesia Transfer of Care Note  Patient: [name] [name]  Procedure(s) Performed: COLONOSCOPY WITH PROPOFOL  Patient Location: Endoscopy Unit  Anesthesia Type:General  Level of Consciousness: drowsy  Airway & Oxygen Therapy: Patient Spontanous Breathing and Patient connected to nasal cannula oxygen  Post-op Assessment: Report given to RN, Post -op Vital signs reviewed and stable, and Patient moving all extremities  Post vital signs: Reviewed and stable  Last Vitals:  Vitals Value Taken Time  BP 104/62 05/05/23 1309  Temp 35.6 C 05/05/23 1309  Pulse 61 05/05/23 1311  Resp 18 05/05/23 1311  SpO2 97 % 05/05/23 1311  Vitals shown include unvalidated device data.  Last Pain:  Vitals:   05/05/23 1309  TempSrc: Temporal  PainSc: 0-No pain         Complications: No notable events documented.

## 2023-05-05 NOTE — H&P (Signed)
Outpatient short stay form Pre-procedure 05/05/2023  Regis Bill, MD  Primary Physician: Miki Kins, FNP  Reason for visit:  History of colon cancer  History of present illness:    77 y/o gentleman with history of CAD, DM II, hypertension, and recent history of obstructing recto-sigmoid lesion s/p chemo/radiation who is here for colonoscopy through his ostomy to assess for lesions prior to lower anterior resection that is scheduled for tomorrow. No blood thinners. No prior colonoscopy besides flex sig that diagnosed his malignancy.    Current Facility-Administered Medications:    0.9 %  sodium chloride infusion, , Intravenous, Continuous, Kendell Gammon, Rossie Muskrat, MD, Last Rate: 20 mL/hr at 05/05/23 1217, Continued from Pre-op at 05/05/23 1217   heparin lock flush 100 unit/mL, 500 Units, Intracatheter, PRN, Louie Boston, MD   sodium chloride flush (NS) 0.9 % injection 10 mL, 10 mL, Intracatheter, PRN, Louie Boston, MD  Medications Prior to Admission  Medication Sig Dispense Refill Last Dose   aspirin 81 MG EC tablet Take 81 mg by mouth daily.     Past Week   atorvastatin (LIPITOR) 40 MG tablet Take 1 tablet (40 mg total) by mouth daily. 90 tablet 3 Past Week   famotidine (PEPCID) 40 MG tablet Take 1 tablet (40 mg total) by mouth daily. 90 tablet 3 Past Week   glimepiride (AMARYL) 4 MG tablet Take 4 mg by mouth daily with breakfast.   Past Week   isosorbide mononitrate (IMDUR) 30 MG 24 hr tablet Take 1 tablet (30 mg total) by mouth daily. 90 tablet 3 Past Week   metFORMIN (GLUCOPHAGE) 1000 MG tablet Take 1,000 mg by mouth 2 (two) times daily with a meal.   Past Week   pioglitazone (ACTOS) 15 MG tablet Take 15 mg by mouth daily.   Past Week   prochlorperazine (COMPAZINE) 10 MG tablet Take 1 tablet (10 mg total) by mouth every 6 (six) hours as needed for nausea or vomiting. 60 tablet 2  at prn   capecitabine (XELODA) 500 MG tablet Take 3 tablets (1,500 mg total) by mouth 2  (two) times daily after a meal. Take Monday-Friday. Take only on days of radiation. (Patient not taking: Reported on 04/28/2023) 168 tablet 0 Not Taking   cyanocobalamin (VITAMIN B12) 500 MCG tablet Take 500 mcg by mouth daily. (Patient not taking: Reported on 04/28/2023)   Not Taking   diphenoxylate-atropine (LOMOTIL) 2.5-0.025 MG tablet Take 1 tablet by mouth 4 (four) times daily as needed for diarrhea or loose stools. (Patient not taking: Reported on 04/28/2023) 30 tablet 1 Not Taking   HYDROcodone-acetaminophen (NORCO/VICODIN) 5-325 MG tablet Take 1 tablet by mouth every 6 (six) hours as needed for moderate pain. (Patient not taking: Reported on 04/28/2023)   Not Taking   lidocaine-prilocaine (EMLA) cream Apply to affected area once (Patient not taking: Reported on 04/28/2023) 30 g 3 Not Taking   nitroGLYCERIN (NITROSTAT) 0.4 MG SL tablet Place 1 tablet (0.4 mg total) under the tongue every 5 (five) minutes as needed for chest pain. (Patient not taking: Reported on 04/28/2023) 25 tablet 6 Not Taking   ondansetron (ZOFRAN) 8 MG tablet Take 1 tablet (8 mg total) by mouth every 8 (eight) hours as needed for nausea or vomiting. (Patient not taking: Reported on 04/28/2023) 60 tablet 2 Not Taking     No Known Allergies   Past Medical History:  Diagnosis Date   Anemia    Anginal pain (HCC)    Bradycardia  Cancer [facility])    Coronary artery disease    x 1 stent   Depression    Diabetes mellitus without complication (HCC)    GERD (gastroesophageal reflux disease)    History of kidney stones    Hyperlipidemia    Hypertension    Nephrolithiasis     Review of systems:  Otherwise negative.    Physical Exam  Gen: Alert, oriented. Appears stated age.  HEENT: PERRLA. Lungs: No respiratory distress CV: RRR Abd: soft, benign, no masses Ext: No edema    Planned procedures: Proceed with colonoscopy. The patient understands the nature of the planned procedure, indications, risks, alternatives and  potential complications including but not limited to bleeding, infection, perforation, damage to internal organs and possible oversedation/side effects from anesthesia. The patient agrees and gives consent to proceed.  Please refer to procedure notes for findings, recommendations and patient disposition/instructions.     Regis Bill, MD [facility]

## 2023-05-05 NOTE — Anesthesia Postprocedure Evaluation (Signed)
Anesthesia Post Note  Patient: [name] [name]  Procedure(s) Performed: COLONOSCOPY WITH PROPOFOL  Patient location during evaluation: Endoscopy Anesthesia Type: General Level of consciousness: awake and alert Pain management: pain level controlled Vital Signs Assessment: post-procedure vital signs reviewed and stable Respiratory status: spontaneous breathing, nonlabored ventilation, respiratory function stable and patient connected to nasal cannula oxygen Cardiovascular status: blood pressure returned to baseline and stable Postop Assessment: no apparent nausea or vomiting Anesthetic complications: no   No notable events documented.   Last Vitals:  Vitals:   05/05/23 1319 05/05/23 1329  BP: 121/76 111/64  Pulse: (!) 59 (!) 57  Resp: 14 12  Temp:    SpO2: 98% 100%    Last Pain:  Vitals:   05/05/23 1329  TempSrc:   PainSc: 0-No pain                 Louie Boston

## 2023-05-06 ENCOUNTER — Inpatient Hospital Stay (HOSPITAL_COMMUNITY)
Admission: RE | Admit: 2023-05-06 | Discharge: 2023-05-11 | DRG: 330 | Disposition: A | Discharge status: Home / Self Care | Payer: Medicare HMO | Attending: Surgery | Admitting: Surgery

## 2023-05-06 ENCOUNTER — Other Ambulatory Visit: Payer: Self-pay

## 2023-05-06 ENCOUNTER — Encounter (HOSPITAL_COMMUNITY)
Admission: RE | Disposition: A | Discharge status: Home / Self Care | Payer: Self-pay | Source: Home / Self Care | Attending: Surgery

## 2023-05-06 ENCOUNTER — Inpatient Hospital Stay (HOSPITAL_COMMUNITY): Payer: Medicare HMO | Admitting: Anesthesiology

## 2023-05-06 ENCOUNTER — Encounter (HOSPITAL_COMMUNITY): Payer: Self-pay | Admitting: Surgery

## 2023-05-06 DIAGNOSIS — Z79899 Other long term (current) drug therapy: Secondary | ICD-10-CM | POA: Diagnosis not present

## 2023-05-06 DIAGNOSIS — I25119 Atherosclerotic heart disease of native coronary artery with unspecified angina pectoris: Secondary | ICD-10-CM | POA: Diagnosis not present

## 2023-05-06 DIAGNOSIS — E1159 Type 2 diabetes mellitus with other circulatory complications: Secondary | ICD-10-CM | POA: Diagnosis present

## 2023-05-06 DIAGNOSIS — F17210 Nicotine dependence, cigarettes, uncomplicated: Secondary | ICD-10-CM | POA: Diagnosis present

## 2023-05-06 DIAGNOSIS — R1310 Dysphagia, unspecified: Secondary | ICD-10-CM | POA: Diagnosis present

## 2023-05-06 DIAGNOSIS — K56699 Other intestinal obstruction unspecified as to partial versus complete obstruction: Secondary | ICD-10-CM | POA: Diagnosis not present

## 2023-05-06 DIAGNOSIS — K9409 Other complications of colostomy: Secondary | ICD-10-CM | POA: Diagnosis not present

## 2023-05-06 DIAGNOSIS — Z7984 Long term (current) use of oral hypoglycemic drugs: Secondary | ICD-10-CM

## 2023-05-06 DIAGNOSIS — K5669 Other partial intestinal obstruction: Secondary | ICD-10-CM | POA: Diagnosis not present

## 2023-05-06 DIAGNOSIS — E785 Hyperlipidemia, unspecified: Secondary | ICD-10-CM | POA: Diagnosis not present

## 2023-05-06 DIAGNOSIS — Z9221 Personal history of antineoplastic chemotherapy: Secondary | ICD-10-CM

## 2023-05-06 DIAGNOSIS — Z955 Presence of coronary angioplasty implant and graft: Secondary | ICD-10-CM | POA: Diagnosis not present

## 2023-05-06 DIAGNOSIS — I152 Hypertension secondary to endocrine disorders: Secondary | ICD-10-CM | POA: Diagnosis present

## 2023-05-06 DIAGNOSIS — I10 Essential (primary) hypertension: Secondary | ICD-10-CM | POA: Diagnosis not present

## 2023-05-06 DIAGNOSIS — F17200 Nicotine dependence, unspecified, uncomplicated: Secondary | ICD-10-CM | POA: Diagnosis not present

## 2023-05-06 DIAGNOSIS — Z87442 Personal history of urinary calculi: Secondary | ICD-10-CM

## 2023-05-06 DIAGNOSIS — C20 Malignant neoplasm of rectum: Secondary | ICD-10-CM

## 2023-05-06 DIAGNOSIS — F32A Depression, unspecified: Secondary | ICD-10-CM | POA: Diagnosis not present

## 2023-05-06 DIAGNOSIS — K5651 Intestinal adhesions [bands], with partial obstruction: Secondary | ICD-10-CM | POA: Diagnosis present

## 2023-05-06 DIAGNOSIS — Z7902 Long term (current) use of antithrombotics/antiplatelets: Secondary | ICD-10-CM

## 2023-05-06 DIAGNOSIS — Z7982 Long term (current) use of aspirin: Secondary | ICD-10-CM | POA: Diagnosis not present

## 2023-05-06 DIAGNOSIS — Z833 Family history of diabetes mellitus: Secondary | ICD-10-CM | POA: Diagnosis not present

## 2023-05-06 DIAGNOSIS — Z923 Personal history of irradiation: Secondary | ICD-10-CM | POA: Diagnosis not present

## 2023-05-06 DIAGNOSIS — Z8249 Family history of ischemic heart disease and other diseases of the circulatory system: Secondary | ICD-10-CM

## 2023-05-06 DIAGNOSIS — N321 Vesicointestinal fistula: Secondary | ICD-10-CM | POA: Diagnosis present

## 2023-05-06 DIAGNOSIS — D490 Neoplasm of unspecified behavior of digestive system: Secondary | ICD-10-CM | POA: Diagnosis not present

## 2023-05-06 DIAGNOSIS — E1169 Type 2 diabetes mellitus with other specified complication: Secondary | ICD-10-CM | POA: Diagnosis not present

## 2023-05-06 DIAGNOSIS — K624 Stenosis of anus and rectum: Secondary | ICD-10-CM | POA: Diagnosis present

## 2023-05-06 DIAGNOSIS — Z803 Family history of malignant neoplasm of breast: Secondary | ICD-10-CM | POA: Diagnosis not present

## 2023-05-06 DIAGNOSIS — K219 Gastro-esophageal reflux disease without esophagitis: Secondary | ICD-10-CM | POA: Diagnosis present

## 2023-05-06 DIAGNOSIS — Z823 Family history of stroke: Secondary | ICD-10-CM | POA: Diagnosis not present

## 2023-05-06 DIAGNOSIS — E1142 Type 2 diabetes mellitus with diabetic polyneuropathy: Secondary | ICD-10-CM | POA: Diagnosis not present

## 2023-05-06 DIAGNOSIS — D630 Anemia in neoplastic disease: Secondary | ICD-10-CM | POA: Diagnosis not present

## 2023-05-06 DIAGNOSIS — I2510 Atherosclerotic heart disease of native coronary artery without angina pectoris: Secondary | ICD-10-CM | POA: Diagnosis present

## 2023-05-06 DIAGNOSIS — K435 Parastomal hernia without obstruction or  gangrene: Secondary | ICD-10-CM | POA: Diagnosis present

## 2023-05-06 DIAGNOSIS — Z932 Ileostomy status: Secondary | ICD-10-CM

## 2023-05-06 DIAGNOSIS — E119 Type 2 diabetes mellitus without complications: Secondary | ICD-10-CM

## 2023-05-06 DIAGNOSIS — Z87891 Personal history of nicotine dependence: Secondary | ICD-10-CM

## 2023-05-06 DIAGNOSIS — L249 Irritant contact dermatitis, unspecified cause: Secondary | ICD-10-CM | POA: Diagnosis present

## 2023-05-06 DIAGNOSIS — C19 Malignant neoplasm of rectosigmoid junction: Secondary | ICD-10-CM | POA: Diagnosis not present

## 2023-05-06 DIAGNOSIS — Z433 Encounter for attention to colostomy: Secondary | ICD-10-CM | POA: Diagnosis not present

## 2023-05-06 HISTORY — PX: XI ROBOTIC ASSISTED LOWER ANTERIOR RESECTION: SHX6558

## 2023-05-06 HISTORY — DX: Unspecified intestinal obstruction, unspecified as to partial versus complete obstruction: K56.609

## 2023-05-06 LAB — GLUCOSE, CAPILLARY
Glucose-Capillary: 150 mg/dL — ABNORMAL HIGH (ref 70–99)
Glucose-Capillary: 130 mg/dL — ABNORMAL HIGH (ref 70–99)
Glucose-Capillary: 192 mg/dL — ABNORMAL HIGH (ref 70–99)
Glucose-Capillary: 199 mg/dL — ABNORMAL HIGH (ref 70–99)
Glucose-Capillary: 268 mg/dL — ABNORMAL HIGH (ref 70–99)

## 2023-05-06 LAB — TYPE AND SCREEN: Antibody Screen: NEGATIVE

## 2023-05-06 SURGERY — RESECTION, RECTUM, LOW ANTERIOR, ROBOT-ASSISTED
Anesthesia: General

## 2023-05-06 MED ORDER — LABETALOL HCL 5 MG/ML IV SOLN
INTRAVENOUS | Status: AC
  Filled 2023-05-06: qty 4

## 2023-05-06 MED ORDER — BISACODYL 5 MG PO TBEC
20.0000 mg | DELAYED_RELEASE_TABLET | Freq: Once | ORAL | Status: DC
Start: 2023-05-06 — End: 2023-05-06

## 2023-05-06 MED ORDER — INSULIN ASPART 100 UNIT/ML IJ SOLN
0.0000 [IU] | Freq: Every day | INTRAMUSCULAR | Status: DC
  Administered 2023-05-06: 3 [IU] via SUBCUTANEOUS
  Administered 2023-05-10: 2 [IU] via SUBCUTANEOUS

## 2023-05-06 MED ORDER — ALBUMIN HUMAN 5 % IV SOLN: INTRAVENOUS | Status: DC | PRN

## 2023-05-06 MED ORDER — SODIUM CHLORIDE 0.9 % IV SOLN
2.0000 g | Freq: Two times a day (BID) | INTRAVENOUS | Status: AC
  Administered 2023-05-06: 2 g via INTRAVENOUS
  Filled 2023-05-06: qty 2

## 2023-05-06 MED ORDER — LACTATED RINGERS IV SOLN: INTRAVENOUS | Status: DC | PRN

## 2023-05-06 MED ORDER — LIDOCAINE HCL (PF) 2 % IJ SOLN
INTRAMUSCULAR | Status: AC
  Filled 2023-05-06: qty 5

## 2023-05-06 MED ORDER — MELATONIN 3 MG PO TABS: 3.0000 mg | ORAL_TABLET | Freq: Every evening | ORAL | Status: DC | PRN

## 2023-05-06 MED ORDER — LACTATED RINGERS IV BOLUS: 1000.0000 mL | Freq: Three times a day (TID) | INTRAVENOUS | Status: AC | PRN

## 2023-05-06 MED ORDER — GLIMEPIRIDE 4 MG PO TABS: 4.0000 mg | ORAL_TABLET | Freq: Every day | ORAL | Status: DC

## 2023-05-06 MED ORDER — FAMOTIDINE 20 MG PO TABS
40.0000 mg | ORAL_TABLET | Freq: Every day | ORAL | Status: DC
  Administered 2023-05-07 – 2023-05-08 (×2): 40 mg via ORAL
  Filled 2023-05-06 (×2): qty 2

## 2023-05-06 MED ORDER — ONDANSETRON HCL 4 MG/2ML IJ SOLN
INTRAMUSCULAR | Status: AC
  Filled 2023-05-06: qty 2

## 2023-05-06 MED ORDER — LIDOCAINE 2% (20 MG/ML) 5 ML SYRINGE
INTRAMUSCULAR | Status: DC | PRN
  Administered 2023-05-06: 60 mg via INTRAVENOUS
  Administered 2023-05-06: 1.5 mg/kg/h via INTRAVENOUS

## 2023-05-06 MED ORDER — ORAL CARE MOUTH RINSE: 15.0000 mL | Freq: Once | OROMUCOSAL | Status: AC

## 2023-05-06 MED ORDER — LABETALOL HCL 5 MG/ML IV SOLN
INTRAVENOUS | Status: DC | PRN
  Administered 2023-05-06: 10 mg via INTRAVENOUS
  Administered 2023-05-06: 5 mg via INTRAVENOUS

## 2023-05-06 MED ORDER — LACTATED RINGERS IV SOLN: INTRAVENOUS | Status: DC

## 2023-05-06 MED ORDER — BUPIVACAINE LIPOSOME 1.3 % IJ SUSP
INTRAMUSCULAR | Status: DC | PRN
  Administered 2023-05-06: 20 mL

## 2023-05-06 MED ORDER — GLIMEPIRIDE 4 MG PO TABS
4.0000 mg | ORAL_TABLET | Freq: Every day | ORAL | Status: DC
  Administered 2023-05-07 – 2023-05-11 (×5): 4 mg via ORAL
  Filled 2023-05-06 (×5): qty 1

## 2023-05-06 MED ORDER — MENTHOL 3 MG MT LOZG: 1.0000 | LOZENGE | OROMUCOSAL | Status: DC | PRN

## 2023-05-06 MED ORDER — BUPIVACAINE-EPINEPHRINE 0.25% -1:200000 IJ SOLN
INTRAMUSCULAR | Status: AC
  Filled 2023-05-06: qty 1

## 2023-05-06 MED ORDER — ATORVASTATIN CALCIUM 20 MG PO TABS
40.0000 mg | ORAL_TABLET | Freq: Every day | ORAL | Status: DC
  Administered 2023-05-06 – 2023-05-11 (×6): 40 mg via ORAL
  Filled 2023-05-06 (×6): qty 2

## 2023-05-06 MED ORDER — ROCURONIUM BROMIDE 10 MG/ML (PF) SYRINGE
PREFILLED_SYRINGE | INTRAVENOUS | Status: DC | PRN
  Administered 2023-05-06: 20 mg via INTRAVENOUS
  Administered 2023-05-06: 80 mg via INTRAVENOUS
  Administered 2023-05-06: 20 mg via INTRAVENOUS

## 2023-05-06 MED ORDER — HYDROMORPHONE HCL 1 MG/ML IJ SOLN
0.5000 mg | INTRAMUSCULAR | Status: DC | PRN
  Administered 2023-05-08: 0.5 mg via INTRAVENOUS
  Filled 2023-05-06: qty 1

## 2023-05-06 MED ORDER — DIPHENHYDRAMINE HCL 12.5 MG/5ML PO ELIX: 12.5000 mg | ORAL_SOLUTION | Freq: Four times a day (QID) | ORAL | Status: DC | PRN

## 2023-05-06 MED ORDER — CALCIUM POLYCARBOPHIL 625 MG PO TABS
625.0000 mg | ORAL_TABLET | Freq: Two times a day (BID) | ORAL | Status: DC
  Administered 2023-05-06 – 2023-05-11 (×11): 625 mg via ORAL
  Filled 2023-05-06 (×11): qty 1

## 2023-05-06 MED ORDER — ALUM & MAG HYDROXIDE-SIMETH 200-200-20 MG/5ML PO SUSP: 30.0000 mL | Freq: Four times a day (QID) | ORAL | Status: DC | PRN

## 2023-05-06 MED ORDER — METRONIDAZOLE 500 MG PO TABS
1000.0000 mg | ORAL_TABLET | ORAL | Status: DC
Start: 2023-05-06 — End: 2023-05-06

## 2023-05-06 MED ORDER — PROPOFOL 10 MG/ML IV BOLUS
INTRAVENOUS | Status: DC | PRN
  Administered 2023-05-06: 50 mg via INTRAVENOUS
  Administered 2023-05-06: 110 mg via INTRAVENOUS

## 2023-05-06 MED ORDER — LIDOCAINE HCL 2 % IJ SOLN
INTRAMUSCULAR | Status: AC
  Filled 2023-05-06: qty 20

## 2023-05-06 MED ORDER — MIDAZOLAM HCL 5 MG/5ML IJ SOLN
INTRAMUSCULAR | Status: DC | PRN
  Administered 2023-05-06: 1 mg via INTRAVENOUS

## 2023-05-06 MED ORDER — ENSURE PRE-SURGERY PO LIQD
592.0000 mL | Freq: Once | ORAL | Status: DC
Start: 2023-05-06 — End: 2023-05-06

## 2023-05-06 MED ORDER — FENTANYL CITRATE (PF) 100 MCG/2ML IJ SOLN
INTRAMUSCULAR | Status: AC
  Filled 2023-05-06: qty 2

## 2023-05-06 MED ORDER — EPHEDRINE SULFATE-NACL 50-0.9 MG/10ML-% IV SOSY
PREFILLED_SYRINGE | INTRAVENOUS | Status: DC | PRN
  Administered 2023-05-06: 5 mg via INTRAVENOUS
  Administered 2023-05-06 (×2): 10 mg via INTRAVENOUS

## 2023-05-06 MED ORDER — CHLORHEXIDINE GLUCONATE CLOTH 2 % EX PADS
6.0000 | MEDICATED_PAD | Freq: Once | CUTANEOUS | Status: AC
  Administered 2023-05-07: 6 via TOPICAL

## 2023-05-06 MED ORDER — INDOCYANINE GREEN 25 MG IV SOLR
INTRAVENOUS | Status: DC | PRN
  Administered 2023-05-06: 25 mg

## 2023-05-06 MED ORDER — MAGIC MOUTHWASH: 15.0000 mL | Freq: Four times a day (QID) | ORAL | Status: DC | PRN

## 2023-05-06 MED ORDER — PHENOL 1.4 % MT LIQD: 2.0000 | OROMUCOSAL | Status: DC | PRN

## 2023-05-06 MED ORDER — TRAMADOL HCL 50 MG PO TABS
50.0000 mg | ORAL_TABLET | Freq: Four times a day (QID) | ORAL | Status: DC | PRN
  Administered 2023-05-06 – 2023-05-07 (×2): 50 mg via ORAL
  Administered 2023-05-07: 100 mg via ORAL
  Administered 2023-05-08: 50 mg via ORAL
  Administered 2023-05-08 – 2023-05-10 (×2): 100 mg via ORAL
  Filled 2023-05-06: qty 1
  Filled 2023-05-06 (×3): qty 2
  Filled 2023-05-06 (×2): qty 1

## 2023-05-06 MED ORDER — ROCURONIUM BROMIDE 10 MG/ML (PF) SYRINGE
PREFILLED_SYRINGE | INTRAVENOUS | Status: AC
  Filled 2023-05-06: qty 10

## 2023-05-06 MED ORDER — HYDROMORPHONE HCL 1 MG/ML IJ SOLN
0.2500 mg | INTRAMUSCULAR | Status: DC | PRN
  Administered 2023-05-06: 0.5 mg via INTRAVENOUS

## 2023-05-06 MED ORDER — POLYETHYLENE GLYCOL 3350 17 GM/SCOOP PO POWD
1.0000 | Freq: Once | ORAL | Status: DC
Start: 2023-05-06 — End: 2023-05-06

## 2023-05-06 MED ORDER — AMISULPRIDE (ANTIEMETIC) 5 MG/2ML IV SOLN: 10.0000 mg | Freq: Once | INTRAVENOUS | Status: DC | PRN

## 2023-05-06 MED ORDER — SODIUM CHLORIDE 0.9% FLUSH
10.0000 mL | Freq: Two times a day (BID) | INTRAVENOUS | Status: DC
  Administered 2023-05-07 – 2023-05-09 (×5): 10 mL

## 2023-05-06 MED ORDER — NEOMYCIN SULFATE 500 MG PO TABS: 1000.0000 mg | ORAL_TABLET | ORAL | Status: DC

## 2023-05-06 MED ORDER — NITROGLYCERIN 0.4 MG SL SUBL: 0.4000 mg | SUBLINGUAL_TABLET | SUBLINGUAL | Status: DC | PRN

## 2023-05-06 MED ORDER — SUGAMMADEX SODIUM 200 MG/2ML IV SOLN
INTRAVENOUS | Status: DC | PRN
  Administered 2023-05-06: 320 mg via INTRAVENOUS

## 2023-05-06 MED ORDER — MIDAZOLAM HCL 2 MG/2ML IJ SOLN
INTRAMUSCULAR | Status: AC
  Filled 2023-05-06: qty 2

## 2023-05-06 MED ORDER — KETAMINE HCL 10 MG/ML IJ SOLN
INTRAMUSCULAR | Status: DC | PRN
  Administered 2023-05-06: 10 mg via INTRAVENOUS
  Administered 2023-05-06 (×2): 20 mg via INTRAVENOUS

## 2023-05-06 MED ORDER — EPHEDRINE 5 MG/ML INJ
INTRAVENOUS | Status: AC
  Filled 2023-05-06: qty 5

## 2023-05-06 MED ORDER — SIMETHICONE 80 MG PO CHEW
40.0000 mg | CHEWABLE_TABLET | Freq: Four times a day (QID) | ORAL | Status: DC | PRN
  Administered 2023-05-07: 40 mg via ORAL
  Filled 2023-05-06: qty 1

## 2023-05-06 MED ORDER — ONDANSETRON HCL 4 MG/2ML IJ SOLN
4.0000 mg | Freq: Four times a day (QID) | INTRAMUSCULAR | Status: DC | PRN
  Administered 2023-05-09: 4 mg via INTRAVENOUS
  Filled 2023-05-06: qty 2

## 2023-05-06 MED ORDER — SODIUM CHLORIDE 0.9% FLUSH: 10.0000 mL | INTRAVENOUS | Status: DC | PRN

## 2023-05-06 MED ORDER — PROCHLORPERAZINE MALEATE 10 MG PO TABS: 10.0000 mg | ORAL_TABLET | Freq: Four times a day (QID) | ORAL | Status: DC | PRN

## 2023-05-06 MED ORDER — ENSURE PRE-SURGERY PO LIQD: 296.0000 mL | Freq: Once | ORAL | Status: DC

## 2023-05-06 MED ORDER — DEXAMETHASONE SODIUM PHOSPHATE 10 MG/ML IJ SOLN
INTRAMUSCULAR | Status: DC | PRN
  Administered 2023-05-06: 8 mg via INTRAVENOUS

## 2023-05-06 MED ORDER — GABAPENTIN 300 MG PO CAPS
300.0000 mg | ORAL_CAPSULE | ORAL | Status: AC
  Administered 2023-05-06: 300 mg via ORAL
  Filled 2023-05-06: qty 1

## 2023-05-06 MED ORDER — SODIUM CHLORIDE 0.9% FLUSH: 3.0000 mL | INTRAVENOUS | Status: DC | PRN

## 2023-05-06 MED ORDER — CHLORHEXIDINE GLUCONATE CLOTH 2 % EX PADS: 6.0000 | MEDICATED_PAD | Freq: Once | CUTANEOUS | Status: DC

## 2023-05-06 MED ORDER — SODIUM CHLORIDE 0.9 % IV SOLN: 250.0000 mL | INTRAVENOUS | Status: DC | PRN

## 2023-05-06 MED ORDER — INSULIN ASPART 100 UNIT/ML IJ SOLN
0.0000 [IU] | INTRAMUSCULAR | Status: DC | PRN
  Administered 2023-05-06: 3 [IU] via SUBCUTANEOUS

## 2023-05-06 MED ORDER — FENTANYL CITRATE (PF) 100 MCG/2ML IJ SOLN
INTRAMUSCULAR | Status: DC | PRN
  Administered 2023-05-06: 25 ug via INTRAVENOUS
  Administered 2023-05-06 (×2): 50 ug via INTRAVENOUS
  Administered 2023-05-06: 75 ug via INTRAVENOUS

## 2023-05-06 MED ORDER — ENOXAPARIN SODIUM 40 MG/0.4ML IJ SOSY
40.0000 mg | PREFILLED_SYRINGE | Freq: Once | INTRAMUSCULAR | Status: AC
  Administered 2023-05-06: 40 mg via SUBCUTANEOUS
  Filled 2023-05-06: qty 0.4

## 2023-05-06 MED ORDER — ACETAMINOPHEN 500 MG PO TABS
1000.0000 mg | ORAL_TABLET | ORAL | Status: AC
  Administered 2023-05-06: 1000 mg via ORAL
  Filled 2023-05-06: qty 2

## 2023-05-06 MED ORDER — PROPOFOL 10 MG/ML IV BOLUS
INTRAVENOUS | Status: AC
  Filled 2023-05-06: qty 20

## 2023-05-06 MED ORDER — INSULIN ASPART 100 UNIT/ML IJ SOLN
0.0000 [IU] | Freq: Three times a day (TID) | INTRAMUSCULAR | Status: DC
  Administered 2023-05-06 – 2023-05-08 (×7): 3 [IU] via SUBCUTANEOUS
  Administered 2023-05-09: 8 [IU] via SUBCUTANEOUS
  Administered 2023-05-09: 3 [IU] via SUBCUTANEOUS
  Administered 2023-05-09: 5 [IU] via SUBCUTANEOUS
  Administered 2023-05-10: 2 [IU] via SUBCUTANEOUS
  Administered 2023-05-10: 3 [IU] via SUBCUTANEOUS
  Administered 2023-05-10: 5 [IU] via SUBCUTANEOUS
  Administered 2023-05-11: 2 [IU] via SUBCUTANEOUS

## 2023-05-06 MED ORDER — BUPIVACAINE-EPINEPHRINE (PF) 0.25% -1:200000 IJ SOLN
INTRAMUSCULAR | Status: DC | PRN
  Administered 2023-05-06: 50 mL

## 2023-05-06 MED ORDER — METHOCARBAMOL 500 MG PO TABS
1000.0000 mg | ORAL_TABLET | Freq: Four times a day (QID) | ORAL | Status: DC | PRN
  Administered 2023-05-06 – 2023-05-10 (×6): 1000 mg via ORAL
  Filled 2023-05-06 (×6): qty 2

## 2023-05-06 MED ORDER — SODIUM CHLORIDE 0.9% FLUSH
3.0000 mL | Freq: Two times a day (BID) | INTRAVENOUS | Status: DC
  Administered 2023-05-06 – 2023-05-08 (×4): 3 mL via INTRAVENOUS

## 2023-05-06 MED ORDER — ALVIMOPAN 12 MG PO CAPS
12.0000 mg | ORAL_CAPSULE | Freq: Two times a day (BID) | ORAL | Status: DC
  Administered 2023-05-07 – 2023-05-08 (×3): 12 mg via ORAL
  Filled 2023-05-06 (×3): qty 1

## 2023-05-06 MED ORDER — DEXAMETHASONE SODIUM PHOSPHATE 10 MG/ML IJ SOLN
INTRAMUSCULAR | Status: AC
  Filled 2023-05-06: qty 1

## 2023-05-06 MED ORDER — LACTATED RINGERS IR SOLN
Status: DC | PRN
  Administered 2023-05-06: 1000 mL

## 2023-05-06 MED ORDER — KETAMINE HCL 50 MG/5ML IJ SOSY
PREFILLED_SYRINGE | INTRAMUSCULAR | Status: AC
  Filled 2023-05-06: qty 5

## 2023-05-06 MED ORDER — ALVIMOPAN 12 MG PO CAPS
12.0000 mg | ORAL_CAPSULE | ORAL | Status: AC
  Administered 2023-05-06: 12 mg via ORAL
  Filled 2023-05-06: qty 1

## 2023-05-06 MED ORDER — CHLORHEXIDINE GLUCONATE 0.12 % MT SOLN
15.0000 mL | Freq: Once | OROMUCOSAL | Status: AC
  Administered 2023-05-06: 15 mL via OROMUCOSAL

## 2023-05-06 MED ORDER — ENSURE SURGERY PO LIQD
237.0000 mL | Freq: Two times a day (BID) | ORAL | Status: DC
  Administered 2023-05-07 – 2023-05-11 (×3): 237 mL via ORAL

## 2023-05-06 MED ORDER — SODIUM CHLORIDE 0.9 % IV SOLN
2.0000 g | INTRAVENOUS | Status: AC
  Administered 2023-05-06: 2 g via INTRAVENOUS
  Filled 2023-05-06: qty 2

## 2023-05-06 MED ORDER — ONDANSETRON HCL 4 MG PO TABS: 4.0000 mg | ORAL_TABLET | Freq: Four times a day (QID) | ORAL | Status: DC | PRN

## 2023-05-06 MED ORDER — HYDRALAZINE HCL 20 MG/ML IJ SOLN: 10.0000 mg | INTRAMUSCULAR | Status: DC | PRN

## 2023-05-06 MED ORDER — GLIMEPIRIDE 2 MG PO TABS
2.0000 mg | ORAL_TABLET | Freq: Every day | ORAL | Status: DC
  Filled 2023-05-06 (×5): qty 1

## 2023-05-06 MED ORDER — PHENYLEPHRINE HCL-NACL 20-0.9 MG/250ML-% IV SOLN
INTRAVENOUS | Status: DC | PRN
  Administered 2023-05-06: 25 ug/min via INTRAVENOUS

## 2023-05-06 MED ORDER — LIDOCAINE HCL (PF) 2 % IJ SOLN
INTRAMUSCULAR | Status: AC
  Filled 2023-05-06: qty 20

## 2023-05-06 MED ORDER — ENALAPRILAT 1.25 MG/ML IV SOLN: 0.6250 mg | Freq: Four times a day (QID) | INTRAVENOUS | Status: DC | PRN

## 2023-05-06 MED ORDER — METHOCARBAMOL 1000 MG/10ML IJ SOLN
1000.0000 mg | Freq: Four times a day (QID) | INTRAVENOUS | Status: DC | PRN
  Administered 2023-05-09: 1000 mg via INTRAVENOUS
  Filled 2023-05-06: qty 1000

## 2023-05-06 MED ORDER — ASPIRIN 81 MG PO TBEC
81.0000 mg | DELAYED_RELEASE_TABLET | Freq: Every day | ORAL | Status: DC
  Administered 2023-05-06 – 2023-05-11 (×6): 81 mg via ORAL
  Filled 2023-05-06 (×6): qty 1

## 2023-05-06 MED ORDER — INSULIN ASPART 100 UNIT/ML IJ SOLN
INTRAMUSCULAR | Status: AC
  Filled 2023-05-06: qty 1

## 2023-05-06 MED ORDER — 0.9 % SODIUM CHLORIDE (POUR BTL) OPTIME
TOPICAL | Status: DC | PRN
  Administered 2023-05-06: 2000 mL

## 2023-05-06 MED ORDER — DIPHENHYDRAMINE HCL 50 MG/ML IJ SOLN: 12.5000 mg | Freq: Four times a day (QID) | INTRAMUSCULAR | Status: DC | PRN

## 2023-05-06 MED ORDER — BUPIVACAINE LIPOSOME 1.3 % IJ SUSP
INTRAMUSCULAR | Status: AC
  Filled 2023-05-06: qty 20

## 2023-05-06 MED ORDER — ALBUMIN HUMAN 5 % IV SOLN
INTRAVENOUS | Status: AC
  Filled 2023-05-06: qty 250

## 2023-05-06 MED ORDER — ENOXAPARIN SODIUM 40 MG/0.4ML IJ SOSY
40.0000 mg | PREFILLED_SYRINGE | INTRAMUSCULAR | Status: DC
  Administered 2023-05-07 – 2023-05-11 (×5): 40 mg via SUBCUTANEOUS
  Filled 2023-05-06 (×5): qty 0.4

## 2023-05-06 MED ORDER — METOPROLOL TARTRATE 5 MG/5ML IV SOLN: 5.0000 mg | Freq: Four times a day (QID) | INTRAVENOUS | Status: DC | PRN

## 2023-05-06 MED ORDER — PROCHLORPERAZINE EDISYLATE 10 MG/2ML IJ SOLN: 5.0000 mg | Freq: Four times a day (QID) | INTRAMUSCULAR | Status: DC | PRN

## 2023-05-06 MED ORDER — STERILE WATER FOR INJECTION IJ SOLN
INTRAMUSCULAR | Status: AC
  Filled 2023-05-06: qty 20

## 2023-05-06 MED ORDER — SODIUM CHLORIDE (PF) 0.9 % IJ SOLN
INTRAMUSCULAR | Status: AC
  Filled 2023-05-06: qty 10

## 2023-05-06 MED ORDER — ACETAMINOPHEN 500 MG PO TABS
1000.0000 mg | ORAL_TABLET | Freq: Four times a day (QID) | ORAL | Status: DC
  Administered 2023-05-06 – 2023-05-11 (×18): 1000 mg via ORAL
  Filled 2023-05-06 (×19): qty 2

## 2023-05-06 MED ORDER — BUPIVACAINE LIPOSOME 1.3 % IJ SUSP: 20.0000 mL | Freq: Once | INTRAMUSCULAR | Status: DC

## 2023-05-06 MED ORDER — HYDROMORPHONE HCL 1 MG/ML IJ SOLN
INTRAMUSCULAR | Status: AC
  Filled 2023-05-06: qty 1

## 2023-05-06 MED ORDER — ONDANSETRON HCL 4 MG/2ML IJ SOLN
INTRAMUSCULAR | Status: DC | PRN
  Administered 2023-05-06: 4 mg via INTRAVENOUS

## 2023-05-06 SURGICAL SUPPLY — 123 items
ADAPTER GOLDBERG URETERAL (ADAPTER) IMPLANT
ADPR CATH 15X14FR FL DRN BG (ADAPTER)
APL PRP STRL LF DISP 70% ISPRP (MISCELLANEOUS)
APPLIER CLIP 5 13 M/L LIGAMAX5 (MISCELLANEOUS)
APPLIER CLIP ROT 10 11.4 M/L (STAPLE)
APR CLP MED LRG 11.4X10 (STAPLE)
APR CLP MED LRG 5 ANG JAW (MISCELLANEOUS)
BAG COUNTER SPONGE SURGICOUNT (BAG) ×1 IMPLANT
BAG SPNG CNTER NS LX DISP (BAG) ×1
BAG URO CATCHER STRL LF (MISCELLANEOUS) ×1 IMPLANT
BLADE EXTENDED COATED 6.5IN (ELECTRODE) IMPLANT
CANNULA REDUCER 12-8 DVNC XI (CANNULA) IMPLANT
CATH URETL OPEN 5X70 (CATHETERS) IMPLANT
CELLS DAT CNTRL 66122 CELL SVR (MISCELLANEOUS) IMPLANT
CHLORAPREP W/TINT 26 (MISCELLANEOUS) IMPLANT
CLIP APPLIE 5 13 M/L LIGAMAX5 (MISCELLANEOUS) IMPLANT
CLIP APPLIE ROT 10 11.4 M/L (STAPLE) IMPLANT
CLOTH BEACON ORANGE TIMEOUT ST (SAFETY) ×1 IMPLANT
COVER SURGICAL LIGHT HANDLE (MISCELLANEOUS) ×2 IMPLANT
COVER TIP SHEARS 8 DVNC (MISCELLANEOUS) ×1 IMPLANT
DEFOGGER SCOPE WARMER CLEARIFY (MISCELLANEOUS) IMPLANT
DEVICE TROCAR PUNCTURE CLOSURE (ENDOMECHANICALS) IMPLANT
DRAIN CHANNEL 19F RND (DRAIN) IMPLANT
DRAPE ARM DVNC X/XI (DISPOSABLE) ×4 IMPLANT
DRAPE COLUMN DVNC XI (DISPOSABLE) ×1 IMPLANT
DRAPE SURG IRRIG POUCH 19X23 (DRAPES) ×1 IMPLANT
DRIVER NDL LRG 8 DVNC XI (INSTRUMENTS) ×1 IMPLANT
DRIVER NDLE LRG 8 DVNC XI (INSTRUMENTS) ×1 IMPLANT
DRSG OPSITE POSTOP 4X10 (GAUZE/BANDAGES/DRESSINGS) IMPLANT
DRSG OPSITE POSTOP 4X6 (GAUZE/BANDAGES/DRESSINGS) IMPLANT
DRSG OPSITE POSTOP 4X8 (GAUZE/BANDAGES/DRESSINGS) IMPLANT
DRSG TEGADERM 2-3/8X2-3/4 SM (GAUZE/BANDAGES/DRESSINGS) ×5 IMPLANT
DRSG TEGADERM 4X4.75 (GAUZE/BANDAGES/DRESSINGS) IMPLANT
ELECT PENCIL ROCKER SW 15FT (MISCELLANEOUS) ×1 IMPLANT
ELECT REM PT RETURN 15FT ADLT (MISCELLANEOUS) ×1 IMPLANT
ENDOLOOP SUT PDS II 0 18 (SUTURE) IMPLANT
EVACUATOR SILICONE 100CC (DRAIN) IMPLANT
GAUZE SPONGE 2X2 8PLY STRL LF (GAUZE/BANDAGES/DRESSINGS) ×1 IMPLANT
GLOVE ECLIPSE 8.0 STRL XLNG CF (GLOVE) ×3 IMPLANT
GLOVE INDICATOR 8.0 STRL GRN (GLOVE) ×3 IMPLANT
GLOVE SURG LX STRL 7.5 STRW (GLOVE) ×1 IMPLANT
GOWN SRG XL LVL 4 BRTHBL STRL (GOWNS) ×1 IMPLANT
GOWN STRL NON-REIN XL LVL4 (GOWNS) ×1
GOWN STRL REUS W/ TWL XL LVL3 (GOWN DISPOSABLE) ×5 IMPLANT
GOWN STRL REUS W/TWL XL LVL3 (GOWN DISPOSABLE) ×5
GRASPER SUT TROCAR 14GX15 (MISCELLANEOUS) IMPLANT
GRASPER TIP-UP FEN DVNC XI (INSTRUMENTS) ×1 IMPLANT
GUIDEWIRE ANG ZIPWIRE 038X150 (WIRE) IMPLANT
GUIDEWIRE STR DUAL SENSOR (WIRE) IMPLANT
HOLDER FOLEY CATH W/STRAP (MISCELLANEOUS) ×1 IMPLANT
IRRIG SUCT STRYKERFLOW 2 WTIP (MISCELLANEOUS) ×1
IRRIGATION SUCT STRKRFLW 2 WTP (MISCELLANEOUS) ×1 IMPLANT
KIT PROCEDURE DVNC SI (MISCELLANEOUS) ×1 IMPLANT
KIT SIGMOIDOSCOPE (SET/KITS/TRAYS/PACK) IMPLANT
KIT TURNOVER KIT A (KITS) IMPLANT
MANIFOLD NEPTUNE II (INSTRUMENTS) ×1 IMPLANT
NDL INSUFFLATION 14GA 120MM (NEEDLE) ×1 IMPLANT
NEEDLE INSUFFLATION 14GA 120MM (NEEDLE) ×1 IMPLANT
PACK CARDIOVASCULAR III (CUSTOM PROCEDURE TRAY) ×1 IMPLANT
PACK COLON (CUSTOM PROCEDURE TRAY) ×1 IMPLANT
PACK CYSTO (CUSTOM PROCEDURE TRAY) ×1 IMPLANT
PAD POSITIONING PINK XL (MISCELLANEOUS) ×1 IMPLANT
PROTECTOR NERVE ULNAR (MISCELLANEOUS) ×2 IMPLANT
RELOAD PROXIMATE 75MM BLUE (ENDOMECHANICALS) ×2 IMPLANT
RELOAD STAPLE 45 3.5 BLU DVNC (STAPLE) IMPLANT
RELOAD STAPLE 45 4.3 GRN DVNC (STAPLE) IMPLANT
RELOAD STAPLE 60 3.5 BLU DVNC (STAPLE) IMPLANT
RELOAD STAPLE 60 4.3 GRN DVNC (STAPLE) IMPLANT
RELOAD STAPLE 75 3.8 BLU REG (ENDOMECHANICALS) IMPLANT
RELOAD STAPLER 3.5X45 BLU DVNC (STAPLE) IMPLANT
RELOAD STAPLER 3.5X60 BLU DVNC (STAPLE) IMPLANT
RELOAD STAPLER 4.3X45 GRN DVNC (STAPLE) IMPLANT
RELOAD STAPLER 4.3X60 GRN DVNC (STAPLE) ×2 IMPLANT
RETRACTOR WND ALEXIS 18 MED (MISCELLANEOUS) IMPLANT
RTRCTR WOUND ALEXIS 18CM MED (MISCELLANEOUS)
SCISSORS LAP 5X35 DISP (ENDOMECHANICALS) ×1 IMPLANT
SCISSORS MNPLR CVD DVNC XI (INSTRUMENTS) ×1 IMPLANT
SEAL UNIV 5-12 XI (MISCELLANEOUS) ×4 IMPLANT
SEALER VESSEL EXT DVNC XI (MISCELLANEOUS) ×1 IMPLANT
SOL ELECTROSURG ANTI STICK (MISCELLANEOUS) ×1
SOLUTION ELECTROSURG ANTI STCK (MISCELLANEOUS) ×1 IMPLANT
SPIKE FLUID TRANSFER (MISCELLANEOUS) ×1 IMPLANT
STAPLER 45 SUREFORM DVNC (STAPLE) IMPLANT
STAPLER 60 SUREFORM DVNC (STAPLE) IMPLANT
STAPLER 90 3.5 STAND SLIM (STAPLE) ×1
STAPLER 90 3.5 STD SLIM (STAPLE) IMPLANT
STAPLER ECHELON POWER CIR 29 (STAPLE) IMPLANT
STAPLER ECHELON POWER CIR 31 (STAPLE) IMPLANT
STAPLER PROXIMATE 75MM BLUE (STAPLE) IMPLANT
STAPLER RELOAD 3.5X45 BLU DVNC (STAPLE)
STAPLER RELOAD 3.5X60 BLU DVNC (STAPLE)
STAPLER RELOAD 4.3X45 GRN DVNC (STAPLE)
STAPLER RELOAD 4.3X60 GRN DVNC (STAPLE) ×2
STOPCOCK 4 WAY LG BORE MALE ST (IV SETS) ×2 IMPLANT
SURGILUBE 2OZ TUBE FLIPTOP (MISCELLANEOUS) IMPLANT
SUT MNCRL AB 4-0 PS2 18 (SUTURE) ×1 IMPLANT
SUT PDS AB 1 CT1 27 (SUTURE) ×2 IMPLANT
SUT PROLENE 0 CT 2 (SUTURE) IMPLANT
SUT PROLENE 2 0 KS (SUTURE) IMPLANT
SUT PROLENE 2 0 SH DA (SUTURE) IMPLANT
SUT SILK 2 0 (SUTURE)
SUT SILK 2 0 SH CR/8 (SUTURE) IMPLANT
SUT SILK 2-0 18XBRD TIE 12 (SUTURE) IMPLANT
SUT SILK 3 0 (SUTURE)
SUT SILK 3 0 SH CR/8 (SUTURE) ×1 IMPLANT
SUT SILK 3-0 18XBRD TIE 12 (SUTURE) IMPLANT
SUT V-LOC BARB 180 2/0GR6 GS22 (SUTURE)
SUT VIC AB 3-0 SH 18 (SUTURE) IMPLANT
SUT VIC AB 3-0 SH 27 (SUTURE)
SUT VIC AB 3-0 SH 27XBRD (SUTURE) IMPLANT
SUT VICRYL 0 UR6 27IN ABS (SUTURE) ×1 IMPLANT
SUTURE V-LC BRB 180 2/0GR6GS22 (SUTURE) IMPLANT
SYR 20ML ECCENTRIC (SYRINGE) ×1 IMPLANT
SYS LAPSCP GELPORT 120MM (MISCELLANEOUS)
SYS WOUND ALEXIS 18CM MED (MISCELLANEOUS) ×1
SYSTEM LAPSCP GELPORT 120MM (MISCELLANEOUS) IMPLANT
SYSTEM WOUND ALEXIS 18CM MED (MISCELLANEOUS) ×1 IMPLANT
TOWEL OR NON WOVEN STRL DISP B (DISPOSABLE) ×1 IMPLANT
TRAY FOLEY MTR SLVR 16FR STAT (SET/KITS/TRAYS/PACK) ×1 IMPLANT
TROCAR ADV FIXATION 5X100MM (TROCAR) ×1 IMPLANT
TUBING CONNECTING 10 (TUBING) ×3 IMPLANT
TUBING INSUFFLATION 10FT LAP (TUBING) ×1 IMPLANT
TUBING UROLOGY SET (TUBING) IMPLANT

## 2023-05-06 NOTE — Op Note (Signed)
05/06/2023  11:19 AM  PATIENT:  [name] [name]  77 y.o. male  Patient Care Team: Miki Kins, FNP as PCP - General (Family Medicine) Mariah Milling, Tollie Pizza, MD as PCP - Cardiology (Cardiology) Antonieta Iba, MD as Consulting Physician (Cardiology) Benita Gutter, RN as Oncology Nurse Navigator Orlie Dakin, Tollie Pizza, MD as Consulting Physician (Oncology) [name], MD as Consulting Physician (Colon and Rectal Surgery)  PRE-OPERATIVE DIAGNOSIS:  RECTAL CANCER COLON OBSTRUCTION DUE TO RECTAL CANCER S/P DIVERTING LOOP COLOSTOMY  POST-OPERATIVE DIAGNOSIS:   RECTAL CANCER COLON OBSTRUCTION DUE TO RECTAL CANCER S/P DIVERTING LOOP COLOSTOMY  PROCEDURE:   LOW ANTERIOR RECTOSIGMOID RESECTION TAKEDOWN OF LOOP COLOSTOMY WITH ANASTOMOSIS MOBILIZATION OF SPLENIC FLEXURE OF COLON DIVERTING LOOP ILEOSTOMY INTRAOPERATIVE ASSESSMENT OF TISSUE VASCULAR PERFUSION USING ICG (indocyanine green) IMMUNOFLUORESCENCE FLEXIBLE SIGMOIDOSCOPY  SURGEON:  Ardeth Sportsman, MD  ASSISTANT:  Romie Levee, MD  An experienced assistant was required given the standard of surgical care given the complexity of the case.  This assistant was needed for exposure, dissection, suction, tissue approximation, retraction, perception, etc  ANESTHESIA:  General endotracheal intubation anesthesia (GETA) and Regional TRANSVERSUS ABDOMINIS PLANE (TAP) nerve block -BILATERAL for perioperative & postoperative pain control at the level of the transverse abdominis & preperitoneal spaces along the flank at the anterior axillary line, from subcostal ridge to iliac crest under laparoscopic guidance provided with liposomal bupivacaine (Experel) 20mL mixed with 50 mL of bupivicaine 0.25% with epinephrine  Estimated Blood Loss (EBL):   Total I/O In: 2050 [I.V.:1700; IV Piggyback:350] Out: 100 [Blood:100].   (See anesthesia record)  Delay start of Pharmacological VTE agent (>24hrs) due to concerns of significant anemia,  surgical blood loss, or risk of bleeding?:  no  DRAINS: (None) and 19 Fr Blake drain the tip resting in the pelvis  SPECIMEN:  Colostomy, Rectosigmoid (open end proximal), and Distal anastomotic ring (FINAL DISTAL MARGIN)  DISPOSITION OF SPECIMEN:  Pathology  COUNTS:  Sponge, needle, & instrument counts CORRECT  PLAN OF CARE: Admit to inpatient   PATIENT DISPOSITION:  PACU - hemodynamically stable.  INDICATION:    Patient that presented with colon obstruction due to rectal mass requiring urgent proximal loop colostomy.  Was done at transverse colon by Dr. Zoila Shutter at [facility].  Workup confirmed rectal cancer.  Underwent total neoadjuvant chemotherapy and chemoradiation therapy.  Referral made to see me.  I recommended rectosigmoid resection.  Most likely takedown of colostomy and conversion to loop ileostomy.    The anatomy & physiology of the digestive tract was discussed.  The pathophysiology of the rectal pathology was discussed.  Natural history risks without surgery was discussed.   I worked to give an overview of the disease and the frequent need to have multispecialty involvement.  I feel the risks of no intervention will lead to serious problems that outweigh the operative risks; therefore, I recommended a partial proctocolectomy to remove the pathology.  Minimally Invasive (Robotic/Laparoscopic) & open techniques were discussed.  We will work to preserve anal & pelvic floor function without sacrificing cure.  Risks such as bleeding, infection, abscess, leak, reoperation, possible temporary or permanent ostomy, hernia, stroke, heart attack, death, and other risks were discussed.  I noted a good likelihood this will help address the problem.   Goals of post-operative recovery were discussed as well.  We will work to minimize complications.  Educational information was available as well.  Questions were answered.  The patient expresses understanding & wishes to proceed with  surgery.  The patient expressed understanding & wished to proceed with surgery.  OR FINDINGS:   Patient had thickened stellate scarring at peritoneal fraction at the junction tween the proximal and mid rectum.  Seem to have a decent treatment effect.  No obvious carcinomatosis.  No obvious metastatic disease on visceral parietal peritoneum or liver.  Need for loop colostomy takedown and splenic flexure mobilization to get reach.  It is a 29mm EEA anastomosis ( distal descending colon  connected to  mid/distal rectal junction .)  It rests 7 cm from the anal verge by rigid proctoscopy.  Protective diverting loop ileostomy created.  Positioned at old left upper quadrant colostomy site with some fascial and skin closure/revision.  Proximal end is cephalad.  CASE DATA:  Type of patient?: Elective [facility] Private Case  Status of Case? Elective Scheduled  Infection Present At Time Of Surgery (PATOS)?  NO  DESCRIPTION:   Informed consent was confirmed.  The patient underwent general anaesthesia without difficulty.  The patient was positioned appropriately.  VTE prevention in place.  Left upper quadrant loop colostomy was closed with a 0 Prolene pursestring suture.  Patient underwent cystoscopic evaluation and firefly injection by both ureters by urologist Dr. Marlou Porch for the identification.  Please see Dr. Jasmine Awe separate note.  The patient was clipped, prepped, & draped in a sterile fashion.  Surgical timeout confirmed our plan.  The patient was positioned in reverse Trendelenburg.  Abdominal entry was gained using Varess technique at the left subcostal ridge on the anterior abdominal wall.  No elevated EtCO2 noted.  Port placed.  Camera inspection revealed no injury.  Extra ports were carefully placed under direct laparoscopic visualization.  Upon entering the abdomen (organ space),we encountered rectosigmoid thickening sick consistent with treatment effect and stellate scarring at the peritoneal  reflection.  Freed off adhesions of ileocolonic region of the anterior abdominal wall in the pelvis.   I reflected the greater omentum and the upper abdomen the small bowel in the upper abdomen.  The patient was carefully positioned.  The Intuitive daVinci robot was docked with camera & instruments carefully placed.  I mobilized the rectosigmoid colon & elevated it to put the main pedicle on tension.  Mobilized the descending and rectosigmoid colon a lateral medial fashion to allow little better mobility.  Without I could better elevate the IMA/IMV pedicles.  I scored the base of peritoneum of the medial side of the mesentery of the elevated left colon from the ligament of Treitz to the mid rectum.   I elevated the sigmoid mesentery and entered into the retro-mesenteric plane. We were able to identify the left ureter and gonadal vessels. We kept those posterior within the retroperitoneum and elevated the left colon mesentery off that. I did isolate the inferior mesenteric artery (IMA) pedicle but did not ligate it yet.  I continued distally and got into the avascular plane posterior to the mesorectum, sparing the nervi ergentes.. This allowed me to help mobilize the rectum as well by freeing the mesorectum off the sacrum.  I stayed away from the right and left ureters.  I kept the lateral vascular pedicles to the rectum intact.    I skeletonized the lymph nodes off the inferior mesenteric artery pedicle.  I went down to its takeoff from the aorta.  I isolated the inferior mesenteric vein off of the ligament of Treitz just cephalad to that as well.  After confirming the left ureter was out of the way, I went ahead and ligated the  inferior mesenteric artery pedicle just near its takeoff from the aorta.  I did ligate the inferior mesenteric vein in a similar fashion.  We ensured hemostasis.  I continued medial to lateral dissection to free the left colon mesentery off the retroperitoneum going up towards the  splenic flexure to allow good mobility and protect the colon mesentery.  I mobilized the left colon in a lateral to medial fashion off the retroperitoneum and sidewall attachments along the line of Toldt up towards the splenic flexure to ensure good mobilization of the remaining left colon to reach into the pelvis.   We then focused on mesorectal dissection.  Freed the mesorectum off the presacral plane until I was distal to the concerning region at the mid rectum.  Freed off peritoneum on the lateral sidewalls as well and transected the mesentery of the lateral pedicles to get distal to the area of concern.  Came around anteriorly such that I had good circumferential mesorectal excision and a good margin distal to the area of concern.  I chose a region at the descending/sigmoid junction that was soft and easily reached down to the rectal stump.  I skeletonized the mesorectum at the mid/distal rectal junction.  We then chose a region for the proximal margin that would reach well for our planned anastomosis (distal descending colon).  Transected the colon mesentery radially to preserve good collateral and marginal artery blood supply.    To access vascular perfusion of tissues, we asked anesthesia use intravenous  indocyanine green (ICG) with IV flush.  I switched to the NIR fluorescence (Firefly mode) imaging window on the daVinci robot platform.  We were able to see good light green visualization of blood vessels with good vascular perfusion of tissues, confirming good tissue perfusion of tissues (distal descending colon and distal mid rectum) planned for anastomosis.  Then transected at the distal margin with a robotic stapler.  We created an extraction incision through the prior diverting colostomy.  Came around the skin and subcutaneous tissues and free adhesions during colostomy to the anterior abdominal wall.  Placed a wound protector.   Got enough mobility to get proximal distal ends.  Freed greater  omentum off and for good anatomy.  Then did a side-to-side stapled anastomosis using a 75 GIA on the antimesenteric mid to distal transverse colon.  Transected the intervening mesentery with clamps and silk ties.  Closed the common colon defect with a TX 90 stapler to good result.  Closed the mesenteric defect transversely using the transverse colon omentum to cover and patch/protect the TX 90 staple line as a mesenteropexy.   I was able to eviscerate the rectosigmoid and descending colon out the wound.   I clamped the colon proximal to this area using a reusable pursestringer device.  Passed a 2-0 Keith needle. I transected at the descending/sigmoid junction with a scalpel. I got healthy bleeding mucosa.  We sent the rectosigmoid colon specimen off to go to pathology.  We sized the colon orifice.  I chose a 29mm EEA anvil stapler system.  I reinforced the prolene pursestring with interrupted silk "belt loop" sutures.  I placed the anvil to the open end of the proximal remaining colon and closed around it using the pursestring.    We did copious irrigation with crystalloid solution.  Hemostasis was good.  Did laparoscopic reinspection.  The anvil came to the upper pelvis but did not come down to the rectal stump very easily.  Therefore mobilized the splenic flexure of  the colon off its lateral and retroperitoneal attachments using laparoscopic cautery scissors to good result until we got better mobility, close to where the prior diverting loop colostomy side-to-side anastomosis was.  With that, the distal end of the remaining colon easily reached down to the rectal stump.  Dr Maisie Fus scrubbed down and did gentle anal dilation and advanced the EEA stapler up the rectal stump. The spike was brought out at the provimal end of the rectal stump under direct visualization.  I  attached the anvil of the proximal colon the spike of the stapler. Anvil was tightened down and held clamped for 60 seconds.  Orientation was  confirmed such that there is no twisting of the colon nor small bowel underneath the mesenteric defect. No concerning tension.  The EEA stapler was fired and held clamped for 30 seconds. The stapler was released & removed. Blue stitch is in the proximal ring.  Care was taken to ensure no other structures were incorporated within this either.  We noted 2 excellent anastomotic rings.   The colon proximal to the anastomosis was then gently occluded. The pelvis was filled with sterile irrigation.  Dr Maisie Fus  did flexible proctoscopy noted the anastomosis was at 7 cm from the anal verge consistent with the proximal rectum.  There was a negative air leak test. There was no tension of mesentery or bowel at the anastomosis.   Tissues looked viable.  Ureters & bowel uninjured.  The anastomosis looked healthy.  Digital rectal exam confirmed the EEA anastomosis is at the tip of the finger.  Had enough mobility on the ileocolonic region such that the wound could reach to the prior left upper quadrant colostomy site.  Avoid having a second area since the right side was mirror-image I already, decided to bring the diverting loop ileostomy off the old diverting loop colostomy site.  Brought that up through the wound protector.  We positioned a drain with the tip resting in the very deep pelvis since it was a low anastomosis in the pelvis with prior radiation.  Secured at skin in the right lower quadrant formal port side using Prolene suture.  Endoluminal gas was evacuated.  Ports & wound protector removed.  We changed gloves. We aspirated the sterile irrigation.  Hemostasis was good.  Sterile unused instruments were used from this point.  I closed the skin at the port sites using Monocryl stitch and sterile dressing.  We assured hemostasis and the former ostomy wound.  Wound irrigated.  Mobilized the subcutaneous fat off the fascia around the old colostomy site.  Closed down the fascia around the diverting colostomy site with  interrupted PDS sutures in a transverse fashion medially and laterally and tied a much more snug 3.5 cm fascial opening.  Could bring the diverting loop ileostomy up through that.  Port sites were secured with 4 Monocryl.  Ended up closing the skin around the old colostomy sac down by doing a medial transverse incision and excising some subcutaneous tissue.  Closed the skin transversely until had a much more 3.5 cm radius skin/subcutaneous defect.  Placed Dermabond over this closure.  Also the closer port site.  Placed sterile dressings.  I then focused on creating diverting loop ileostomy to protect to the colorectal anastomosis since it was low with prior radiation.  Hopefully he will just need it for 3 months..  Used 2-0 Vicryl sutures with the fascia subcutaneous tissues and the ileum flush with the skin.  And 4 locations.  Opened  up the diverting loop ileostomy in a gentle transverse curve.  Brought the stitches down and inverted to get a good Brooke loop ileostomy where the proximal loop is cephalad and had a 4 cm proximal limb.  The distal end was inferior and flatter, 1 cm high.  Then interrupted 2-0 Vicryl sutures around the edges for good opposition to the mucosa and skin.  I did finger intubation to prove that both the proximal distal ends were patent without any twisting or torsion.  Ileostomy appliance secured.    Patient is being extubated go to recovery room. I had discussed postop care with the patient in detail the office & in the holding area. Instructions are written. I discussed operative findings, updated the patient's status, discussed probable steps to recovery, and gave postoperative recommendations to the patient's spouse, Tyler Aas .  Recommendations were made.  Questions were answered.  She expressed understanding & appreciation.  Ardeth Sportsman, M.D., F.A.C.S. Gastrointestinal and Minimally Invasive Surgery [facility] [address], [address] [place], [place]  [address] [phone] Main / Paging

## 2023-05-06 NOTE — Interval H&P Note (Signed)
History and Physical Interval Note:  05/06/2023 6:57 AM  [name] [name]  has presented today for surgery, with the diagnosis of RECTAL CANCER.  The various methods of treatment have been discussed with the patient and family. After consideration of risks, benefits and other options for treatment, the patient has consented to  Procedure(s) with comments: XI ROBOTIC ASSISTED LOWER ANTERIOR RECTOSIGMOID RESECTION, POSSIBLE TAKEDOWN OF OLD COLOSTOMY AND NEW DIVERTING LOOPS COLOSTOMY (N/A) - 4.5 HOURS TOTAL INCLUDING [facility] RIGID PROCTOSCOPY (N/A) CYSTOSCOPY with FIREFLY INJECTION (N/A) as a surgical intervention.  The patient's history has been reviewed, patient examined, no change in status, stable for surgery.  I have reviewed the patient's chart and labs.  Questions were answered to the patient's satisfaction.    I have re-reviewed the the patient's records, history, medications, and allergies.  I have re-examined the patient.  I again discussed intraoperative plans and goals of post-operative recovery.  The patient agrees to proceed.  [name] [name]  1946-10-23 [id number]  Patient Care Team: Miki Kins, FNP as PCP - General (Family Medicine) Antonieta Iba, MD as PCP - Cardiology (Cardiology) Antonieta Iba, MD as Consulting Physician (Cardiology) Benita Gutter, RN as Oncology Nurse Navigator Jeralyn Ruths, MD as Consulting Physician (Oncology)  Patient Active Problem List   Diagnosis Date Noted   Colostomy prolapse [facility]) 03/16/2023   Parastomal hernia without obstruction or gangrene 03/16/2023   Irritant contact dermatitis associated with fecal stoma    Colostomy complication (HCC)    Rectal adenocarcinoma (HCC) 08/07/2022   Stricture of rectosigmoid junction on CT. 07/26/2022   Partial Large bowel obstruction (HCC)    Type 2 diabetes mellitus with diabetic polyneuropathy, without long-term current use of insulin (HCC) 02/06/2020   Back pain  12/04/2014   Hyperlipidemia associated with type 2 diabetes mellitus (HCC) 04/04/2010   Tobacco abuse 04/04/2010   Hypertension associated with diabetes (HCC) 04/04/2010   Coronary artery disease 04/04/2010    Past Medical History:  Diagnosis Date   Anemia    Anginal pain (HCC)    Bradycardia    Cancer (HCC)    Coronary artery disease    x 1 stent   Depression    Diabetes mellitus without complication (HCC)    GERD (gastroesophageal reflux disease)    History of kidney stones    Hyperlipidemia    Hypertension    Nephrolithiasis     Past Surgical History:  Procedure Laterality Date   CARDIAC CATHETERIZATION     CATARACT EXTRACTION     CORONARY ANGIOPLASTY     EYE SURGERY     FLEXIBLE SIGMOIDOSCOPY N/A 07/27/2022   Procedure: FLEXIBLE SIGMOIDOSCOPY;  Surgeon: Regis Bill, MD;  Location: [facility] ENDOSCOPY;  Service: Endoscopy;  Laterality: N/A;   KIDNEY STONE SURGERY     NECK SURGERY  12/18/2011   nephrolithiasis     PORTACATH PLACEMENT Right 08/24/2022   Procedure: INSERTION PORT-A-CATH;  Surgeon: Campbell Lerner, MD;  Location: [facility] ORS;  Service: General;  Laterality: Right;   stent (other)     TRANSVERSE LOOP COLOSTOMY N/A 07/28/2022   Procedure: TRANSVERSE LOOP COLOSTOMY;  Surgeon: Campbell Lerner, MD;  Location: [facility] ORS;  Service: General;  Laterality: N/A;    Social History   Socioeconomic History   Marital status: Married    Spouse name: Doris   Number of children: Not on file   Years of education: Not on file   Highest education level: Not on file  Occupational History  Not on file  Tobacco Use   Smoking status: Every Day    Packs/day: 0.25    Years: 36.00    Additional pack years: 0.00    Total pack years: 9.00    Types: Cigarettes   Smokeless tobacco: Never   Tobacco comments:    Smokes 6 cigarettes daily   Vaping Use   Vaping Use: Never used  Substance and Sexual Activity   Alcohol use: No   Drug use: No   Sexual activity: Not on  file  Other Topics Concern   Not on file  Social History Narrative   Not on file   Social Determinants of Health   Financial Resource Strain: Not on file  Food Insecurity: No Food Insecurity (07/27/2022)   Hunger Vital Sign    Worried About Running Out of Food in the Last Year: Never true    Ran Out of Food in the Last Year: Never true  Transportation Needs: No Transportation Needs (07/27/2022)   PRAPARE - Administrator, Civil Service (Medical): No    Lack of Transportation (Non-Medical): No  Physical Activity: Not on file  Stress: Not on file  Social Connections: Not on file  Intimate Partner Violence: Not At Risk (07/27/2022)   Humiliation, Afraid, Rape, and Kick questionnaire    Fear of Current or Ex-Partner: No    Emotionally Abused: No    Physically Abused: No    Sexually Abused: No    Family History  Problem Relation Age of Onset   Heart disease Father     Medications Prior to Admission  Medication Sig Dispense Refill Last Dose   famotidine (PEPCID) 40 MG tablet Take 1 tablet (40 mg total) by mouth daily. 90 tablet 3 05/06/2023 at 0400   isosorbide mononitrate (IMDUR) 30 MG 24 hr tablet Take 1 tablet (30 mg total) by mouth daily. 90 tablet 3 05/06/2023 at 0400   prochlorperazine (COMPAZINE) 10 MG tablet Take 1 tablet (10 mg total) by mouth every 6 (six) hours as needed for nausea or vomiting. 60 tablet 2 Past Week   aspirin 81 MG EC tablet Take 81 mg by mouth daily.     05/02/2023   atorvastatin (LIPITOR) 40 MG tablet Take 1 tablet (40 mg total) by mouth daily. 90 tablet 3 05/03/2023   capecitabine (XELODA) 500 MG tablet Take 3 tablets (1,500 mg total) by mouth 2 (two) times daily after a meal. Take Monday-Friday. Take only on days of radiation. (Patient not taking: Reported on 04/28/2023) 168 tablet 0    cyanocobalamin (VITAMIN B12) 500 MCG tablet Take 500 mcg by mouth daily. (Patient not taking: Reported on 04/28/2023)      diphenoxylate-atropine (LOMOTIL)  2.5-0.025 MG tablet Take 1 tablet by mouth 4 (four) times daily as needed for diarrhea or loose stools. (Patient not taking: Reported on 04/28/2023) 30 tablet 1    glimepiride (AMARYL) 4 MG tablet Take 4 mg by mouth daily with breakfast.   05/03/2023   HYDROcodone-acetaminophen (NORCO/VICODIN) 5-325 MG tablet Take 1 tablet by mouth every 6 (six) hours as needed for moderate pain. (Patient not taking: Reported on 04/28/2023)      lidocaine-prilocaine (EMLA) cream Apply to affected area once (Patient not taking: Reported on 04/28/2023) 30 g 3    metFORMIN (GLUCOPHAGE) 1000 MG tablet Take 1,000 mg by mouth 2 (two) times daily with a meal.   05/03/2023   nitroGLYCERIN (NITROSTAT) 0.4 MG SL tablet Place 1 tablet (0.4 mg total) under the tongue  every 5 (five) minutes as needed for chest pain. (Patient not taking: Reported on 04/28/2023) 25 tablet 6    ondansetron (ZOFRAN) 8 MG tablet Take 1 tablet (8 mg total) by mouth every 8 (eight) hours as needed for nausea or vomiting. (Patient not taking: Reported on 04/28/2023) 60 tablet 2    pioglitazone (ACTOS) 15 MG tablet Take 15 mg by mouth daily.   05/03/2023    Current Facility-Administered Medications  Medication Dose Route Frequency Provider Last Rate Last Admin   bupivacaine liposome (EXPAREL) 1.3 % injection 266 mg  20 mL Infiltration Once [name], MD       cefoTEtan (CEFOTAN) 2 g in sodium chloride 0.9 % 100 mL IVPB  2 g Intravenous On Call to OR [name], MD       Chlorhexidine Gluconate Cloth 2 % PADS 6 each  6 each Topical Once [name], MD       insulin aspart (novoLOG) injection 0-7 Units  0-7 Units Subcutaneous Q2H PRN Lewie Loron, MD       lactated ringers infusion   Intravenous Continuous Bethena Midget, MD 10 mL/hr at 05/06/23 [phone] New Bag at 05/06/23 [id number]     No Known Allergies  BP 130/70   Pulse 69   Temp (!) 97.5 F (36.4 C) (Oral)   Resp 13   Ht 6' (1.829 m)   Wt 81.4 kg   SpO2 100%   BMI 24.33 kg/m    Labs: Results for orders placed or performed during the hospital encounter of 05/06/23 (from the past 48 hour(s))  Glucose, capillary     Status: Abnormal   Collection Time: 05/06/23  6:00 AM  Result Value Ref Range   Glucose-Capillary 150 (H) 70 - 99 mg/dL    Comment: Glucose reference range applies only to samples taken after fasting for at least 8 hours.   Comment 1 Notify RN    Comment 2 Document in Chart     Imaging / Studies: No results found.   Ardeth Sportsman, M.D., F.A.C.S. Gastrointestinal and Minimally Invasive Surgery [facility] [address] [place], [place] [address] [phone] Main / Paging  05/06/2023 6:57 AM    Ardeth Sportsman

## 2023-05-06 NOTE — Anesthesia Procedure Notes (Signed)
Arterial Line Insertion Start/End6/20/2024 7:45 AM, 05/06/2023 7:50 AM Performed by: Thornell Mule, CRNA, CRNA  Patient location: OR. Preanesthetic checklist: patient identified, IV checked, site marked, risks and benefits discussed, surgical consent, monitors and equipment checked, pre-op evaluation, timeout performed and anesthesia consent Right, radial was placed Catheter size: 20 G Hand hygiene performed , maximum sterile barriers used  and Seldinger technique used Allen's test indicative of satisfactory collateral circulation Attempts: 1 Procedure performed without using ultrasound guided technique. Following insertion, dressing applied and Biopatch. Post procedure assessment: normal  Patient tolerated the procedure well with no immediate complications.

## 2023-05-06 NOTE — H&P (Signed)
05/06/2023   REFERRING PHYSICIAN: Eber Hong,*  Patient Care Team: Corky Downs, MD as PCP - General (Cardiovascular Disease) Mia Creek, Janit Bern, MD as Consulting Provider (Gastroenterology) Michaell Cowing, Shawn Route, MD as Consulting Provider (Colon and Rectal Surgery) Chrystal, Gordy Councilman, MD (Radiation Oncology) Hulen Shouts, MD (Cardiovascular Disease) Eber Hong, MD (Hematology and Oncology)  PROVIDER: Jarrett Soho, MD  [facility] MRN: [id number] DOB: 03-10-1946 05/06/2023   SUBJECTIVE   Chief Complaint: New Consultation (Rectal cancer)   [name] [name] is a 77 y.o. male  who is seen today as an office consultation  at the request of DrOrlie Dakin  for evaluation of rectal cancer   History of Present Illness:  77 year old male. Had worsening abdominal pain and found to have obstructing mass causing complete colon restriction. Underwent diverting distal transverse colostomy in September 2023 by Dr. Claudine Mouton in [facility]. Port-A-Cath placed a few weeks later when carcinoma diagnosed by flex sigmoidoscopy. No evidence of any metastatic disease. Consultation with Dr. Aggie Cosier with radiation oncology and Dr. Orlie Dakin with medical oncology.  He underwent systemic FOLFOX chemotherapy for a TNT approach. He completed that December 30, 2022. He has since undergone Xeloda and XRT treatments 5.5-week. Completed that February 16, 2023. Referral sent to our group at some point. The patient recalls he was encouraged to make a choice months ago but wanted to get through everything before he decided to meet with the surgeon. This is the first time I am meeting him. Is here with his wife. I gave him time to vent about the marathon that has been his diagnosis and treatment so far.  Patient notes it started with some straining and constipation and some occasional bleeding. Then became obstipated and got admitted. Treatment above. He is lost over 20 pounds with  everything. Appetite is down had some episodes of nausea and vomiting the past month but that is better now. He has been reluctant to get on his tractor or do more aggressive exercises. Wife trying to encourage him to get out and walk. Claims he can walk about 20 minutes gradually. He does smoke but is cut back to less than half pack a day. He does have diabetes but is on oral hypoglycemics. Last A1c last month was in the sevens. Sugars less than 150 at home. Claims he gets a little bit of rectal leakage. He is emptying the colostomy bag 3-4 times a day. The bag itself can stay on about every couple of days. Wife helps take care of him on that.  Medical History:  Past Medical History:  Diagnosis Date  Heart disease  Type 2 diabetes mellitus (CMS/HHS-HCC)   Patient Active Problem List  Diagnosis  Type 2 diabetes mellitus with diabetic polyneuropathy, without long-term current use of insulin (CMS/HHS-HCC)  Hypertension associated with diabetes (CMS/HHS-HCC)  Hyperlipidemia associated with type 2 diabetes mellitus (CMS-HCC)  Tobacco abuse  Coronary artery disease  Hyperlipidemia associated with type 2 diabetes mellitus (CMS/HHS-HCC)  Large bowel obstruction (CMS/HHS-HCC)  Rectal adenocarcinoma (CMS/HHS-HCC)  Stricture of rectum  Colostomy in place (CMS/HHS-HCC)   Past Surgical History:  Procedure Laterality Date  COLOSTOMY 2023  FLEXIBLE SIGMOIDOSCOPY 07/27/2022  Invasive colorectal adenocarcinoma/Proctitis/Repeat PRN(Per surgeons recommendations)/CTL  cardiac stent  EYELID SURGERY  neck surgery    No Known Allergies  Current Outpatient Medications on File Prior to Visit  Medication Sig Dispense Refill  aspirin 81 MG EC tablet Take 81 mg by mouth once daily  atorvastatin (LIPITOR) 40 MG tablet Take 40 mg  by mouth once daily  clopidogreL (PLAVIX) 75 mg tablet Take 75 mg by mouth once daily  famotidine (PEPCID) 40 MG tablet Take 40 mg by mouth once daily  fluticasone propionate  (FLONASE) 50 mcg/actuation nasal spray Place 2 sprays into both nostrils once daily 16 g 1  glimepiride (AMARYL) 4 MG tablet Take 1 tablet (4 mg total) by mouth daily with breakfast 30 tablet 0  isosorbide mononitrate (IMDUR) 30 MG ER tablet Take 30 mg by mouth once daily  metFORMIN (GLUCOPHAGE) 1000 MG tablet Take 1 tablet (1,000 mg total) by mouth 2 (two) times daily with meals 60 tablet 0  pioglitazone (ACTOS) 15 MG tablet TAKE 1 TABLET BY MOUTH EVERY DAY 90 tablet 3   No current facility-administered medications on file prior to visit.   Family History  Problem Relation Age of Onset  Diabetes Mother  Breast cancer Mother  Stroke Mother  Heart failure Father  Heart failure Brother    Social History   Tobacco Use  Smoking Status Every Day  Smokeless Tobacco Never    Social History   Socioeconomic History  Marital status: Married  Tobacco Use  Smoking status: Every Day  Smokeless tobacco: Never  Vaping Use  Vaping status: Never Used  Substance and Sexual Activity  Alcohol use: Not Currently  Drug use: Never  Sexual activity: Yes   Social Determinants of Health   Food Insecurity: No Food Insecurity (07/27/2022)  Received from [facility] [place]  Hunger Vital Sign  Worried About Running Out of Food in the Last Year: Never true  Ran Out of Food in the Last Year: Never true  Transportation Needs: No Transportation Needs (07/27/2022)  Received from [facility] [place]  PRAPARE - Transportation  Lack of Transportation (Medical): No  Lack of Transportation (Non-Medical): No   ############################################################  Review of Systems: A complete review of systems (ROS) was obtained from the patient.  We have reviewed this information and discussed as appropriate with the patient.  See HPI as well for other pertinent ROS.  Constitutional: No fevers, chills, sweats. Weight stable Eyes: No vision changes, No discharge HENT: No sore  throats, nasal drainage Lymph: No neck swelling, No bruising easily Pulmonary: No cough, productive sputum CV: No orthopnea, PND . No exertional chest/neck/shoulder/arm pain. Patient can walk 1/4 mile gradually.   GI: No personal nor family history of inflammatory bowel disease, irritable bowel syndrome, allergy such as Celiac Sprue, dietary/dairy problems, colitis, ulcers nor gastritis. No recent sick contacts/gastroenteritis. No travel outside the country. No changes in diet.  Renal: No UTIs, No hematuria Genital: No drainage, bleeding, masses Musculoskeletal: No severe joint pain. Good ROM major joints Skin: No sores or lesions Heme/Lymph: No easy bleeding. No swollen lymph nodes Neuro: No active seizures. No facial droop Psych: No hallucinations. No agitation  OBJECTIVE   Vitals:  03/01/23 0938  BP: 120/80  Pulse: 85  Temp: 36.9 C (98.4 F)  Weight: 75.8 kg (167 lb)  Height: 182.9 cm (6')  PainSc: 0-No pain   Body mass index is 22.65 kg/m.  PHYSICAL EXAM:  Constitutional: Not cachectic. Hygeine adequate. Vitals signs as above.  Eyes: No glasses. Vision adequate,Pupils reactive, normal extraocular movements. Sclera nonicteric Neuro: CN II-XII intact. No major focal sensory defects. No major motor deficits. Lymph: No head/neck/groin lymphadenopathy Psych: No severe agitation. No severe anxiety. Judgment & insight Adequate, Oriented x4, HENT: Normocephalic, Mucus membranes moist. No thrush. Hearing: adequate Neck: Supple, No tracheal deviation. No obvious thyromegaly Chest: No  pain to chest wall compression. Good respiratory excursion. No audible wheezing CV: Pulses intact. regular. No major extremity edema Ext: No obvious deformity or contracture. Edema: Not present. No cyanosis Skin: No major subcutaneous nodules. Warm and dry Musculoskeletal: Severe joint rigidity not present. No obvious clubbing. No digital petechiae. Mobility: no assist device moving easily without  restrictions  Abdomen: Flat Soft. Nondistended. Nontender. Left upper quadrant colostomy in place with thin stool in bag hernia: Not present. Diastasis recti: Mild supraumbilical midline. No hepatomegaly. No splenomegaly.  Genital/Pelvic: Inguinal hernia: Mild bulging in left groin but no major hernia present. Inguinal lymph nodes: without lymphadenopathy nor hidradenitis.   Rectal: Perianal skin Mild fecal soiling  Pruritis ani: Mild Pilonidal disease: Not present Condyloma / warts: Not present  Anal fissure: Not present Perirectal abscess/fistula Not present External hemorrhoids Not present  Digital and anoscopic rectal exam Barely tolerated  Sphincter tone Normal  Hemorrhoidal piles Grade 1 normal Prostate: Normal size & no nodularity Rectovaginal septum: N/A Rectal masses: At tip of my finger I can feel a bulky mobile mass in the proximal rectum falling down with narrow lumen consistent with strictured cancer posttreatment. 9-10 cm from anal verge, closest edge feels anterior  Other significant findings: N/A  Patient examined with patient in decubitus position .  ###################################    ###################################################################  Labs, Imaging and Diagnostic Testing:  Located in 'Care Everywhere' section of Epic EMR chart  PRIOR [facility] CLINIC NOTES:  Not applicable  SURGERY NOTES:  Located in 'Care Everywhere' section of Epic EMR chart  PATHOLOGY:  Located in 'Care Everywhere' section of Epic EMR chart  Assessment and Plan:  DIAGNOSES:  Diagnoses and all orders for this visit:  Rectal adenocarcinoma (CMS/HHS-HCC) - polyethylene glycol (MIRALAX) powder; Take 233.75 g by mouth once for 1 dose Take according to your procedure prep instructions.  Tobacco abuse  Colostomy in place (CMS/HHS-HCC)  Other orders - bisacodyL (DULCOLAX) 5 mg EC tablet; Take 4 tablets (20 mg total) by mouth once daily as needed for Constipation  for up to 1 dose - metroNIDAZOLE (FLAGYL) 500 MG tablet; Take 2 tablets (1,000 mg total) by mouth 3 (three) times daily for 3 doses SEE BOWEL PREP INSTRUCTIONS: Take 2 tablets at 2pm, 3pm, and 10pm the day prior to your colon operation. - neomycin 500 mg tablet; Take 2 tablets (1,000 mg total) by mouth 3 (three) times daily for 3 doses SEE BOWEL PREP INSTRUCTIONS: Take 2 tablets at 2pm, 3pm, and 10pm the day prior to your colon operation.    ASSESSMENT/PLAN  77 year old male with worsening constipation and hematochezia progressing to the point of obstruction requiring urgent admission and diverting colostomy in September. Has completed total neoadjuvant chemotherapy of FOLFOX and Xeloda/XRT. All completed 02/16/2023  Standard of care is to consider segmental low anterior resection 8-12 weeks from completing all therapy. 10-weeks = now, mid-June. Reasonable candidate for robotic minimally invasive approach. This seems in the proximal rectum feels mobile at least partially and is above the prostate, so hopefully I will not need to do splenic flexure mobilization for reach. I may go ahead and proceed with taking down the diverting transverse colostomy and then bringing up a temporary loop ileostomy that will be easier to takedown in the future. That way both Colo-colonic & Colorectal anastomoses are protected and diverted.   The anatomy & physiology of the digestive tract was discussed. The pathophysiology of the rectal pathology was discussed. Natural history risks without surgery was discussed. I worked to give an  overview of the disease and the frequent need to have multispecialty involvement. I feel the risks of no intervention will lead to serious problems that outweigh the operative risks; therefore, I recommended a partial proctocolectomy to remove the pathology. Minimally Invasive (Robotic/Laparoscopic) & open techniques were discussed. We will work to preserve anal & pelvic floor function without  sacrificing cure.  Risks such as bleeding, infection, abscess, leak, reoperation, possible temporary or permanent ostomy, hernia, stroke, heart attack, death, and other risks were discussed. I noted a good likelihood this will help address the problem. Goals of post-operative recovery were discussed as well. We will work to minimize complications. Educational information was available as well. Questions were answered. The patient expresses understanding & wishes to proceed with surgery.  Asked urology to firefly since it was bulky and obstructing to make sure the ureters protected after radiation and diversion.  No proximal lesions of concern by colonoscopy yesteday  I strongly recommend that the patient STOP SMOKING! We talked to the patient about the dangers of smoking. We stressed that tobacco use dramatically increases the risk of peri-operative complications such as infection, tissue necrosis leaving to problems with incision/wound and organ healing, hernia, chronic pain, heart attack, stroke, DVT, pulmonary embolism, and death. We noted there are programs in our community to help stop smoking. Information was available.   Cardiac clearance done.  Plavix held   Ardeth Sportsman, MD, FACS, MASCRS Esophageal, Gastrointestinal & Colorectal Surgery Robotic and Minimally Invasive Surgery  [facility] [address], [address] [place], [place] [address] [phone] Fax [phone] Main  CONTACT INFORMATION:  Weekday (9AM-5PM): Call [facility] main office at [phone]  Weeknight (5PM-9AM) or Weekend/Holiday: Check [email or web address] (password " [email or web address]") for General Surgery [facility] coverage  (Please, do not use SecureChat as it is not reliable communication to reach operating surgeons for immediate patient care given surgeries/outpatient duties/clinic/cross-coverage/off post-call which would lead to a delay in care.  Epic staff messaging available  for outptient concerns, but may not be answered for 48 hours or more).    05/06/2023

## 2023-05-06 NOTE — Discharge Instructions (Addendum)
SURGERY: POST OP INSTRUCTIONS (Surgery for small bowel obstruction, colon resection, etc)   ######################################################################  EAT Gradually transition to a high fiber diet with a fiber supplement over the next few days after discharge  WALK Walk an hour a day.  Control your pain to do that.    CONTROL PAIN Control pain so that you can walk, sleep, tolerate sneezing/coughing, go up/down stairs.  HAVE A BOWEL MOVEMENT DAILY Keep your bowels regular to avoid problems.  OK to try a laxative to override constipation.  OK to use an antidairrheal to slow down diarrhea.  Call if not better after 2 tries  CALL IF YOU HAVE PROBLEMS/CONCERNS Call if you are still struggling despite following these instructions. Call if you have concerns not answered by these instructions  ######################################################################   DIET Follow a light diet the first few days at home.  Start with a bland diet such as soups, liquids, starchy foods, low fat foods, etc.  If you feel full, bloated, or constipated, stay on a ful liquid or pureed/blenderized diet for a few days until you feel better and no longer constipated. Be sure to drink plenty of fluids every day to avoid getting dehydrated (feeling dizzy, not urinating, etc.). Gradually add a fiber supplement to your diet over the next week.  Gradually get back to a regular solid diet.  Avoid fast food or heavy meals the first week as you are more likely to get nauseated. It is expected for your digestive tract to need a few months to get back to normal.  It is common for your bowel movements and stools to be irregular.  You will have occasional bloating and cramping that should eventually fade away.  Until you are eating solid food normally, off all pain medications, and back to regular activities; your bowels will not be normal. Focus on eating a low-fat, high fiber diet the rest of your life  (See Getting to Good Bowel Health, below).  CARE of your INCISION or WOUND  It is good for closed incisions and even open wounds to be washed every day.  Shower every day.  Short baths are fine.  Wash the incisions and wounds clean with soap & water.    You may leave closed incisions open to air if it is dry.   You may cover the incision with clean gauze & replace it after your daily shower for comfort.  TEGADERM & WICKS:  You have clear gauze band-aid dressings over your closed incision(s).  Remove the dressings & shoelace ribbon wicks in your largest incision 3 days after surgery.= 6/23 Sunday    If you have an open wound with a wound vac, see wound vac care instructions.    ACTIVITIES as tolerated Start light daily activities --- self-care, walking, climbing stairs-- beginning the day after surgery.  Gradually increase activities as tolerated.  Control your pain to be active.  Stop when you are tired.  Ideally, walk several times a day, eventually an hour a day.   Most people are back to most day-to-day activities in a few weeks.  It takes 4-8 weeks to get back to unrestricted, intense activity. If you can walk 30 minutes without difficulty, it is safe to try more intense activity such as jogging, treadmill, bicycling, low-impact aerobics, swimming, etc. Save the most intensive and strenuous activity for last (Usually 4-8 weeks after surgery) such as sit-ups, heavy lifting, contact sports, etc.  Refrain from any intense heavy lifting or straining until you are  off narcotics for pain control.  You will have off days, but things should improve week-by-week. DO NOT PUSH THROUGH PAIN.  Let pain be your guide: If it hurts to do something, don't do it.  Pain is your body warning you to avoid that activity for another week until the pain goes down. You may drive when you are no longer taking narcotic prescription pain medication, you can comfortably wear a seatbelt, and you can safely make sudden  turns/stops to protect yourself without hesitating due to pain. You may have sexual intercourse when it is comfortable. If it hurts to do something, stop.   MEDICATIONS Take your usually prescribed home medications unless otherwise directed.   Blood thinners:  You can restart any strong blood thinners after the second postoperative day.  It is OK to continue aspirin before & after surgery..    Some blood in BMs the first 1-2 weeks is common but should taper down & be small volume.  If you are passing many large clots, call your surgeon    PAIN CONTROL Pain after surgery or related to activity is often due to strain/injury to muscle, tendon, nerves and/or incisions.  This pain is usually short-term and will improve in a few months.  To help speed the process of healing and to get back to regular activity more quickly, DO THE FOLLOWING THINGS TOGETHER: Increase activity gradually.  DO NOT PUSH THROUGH PAIN Use Ice and/or Heat Try Gentle Massage and/or Stretching Take over the counter pain medication Take Narcotic prescription pain medication for more severe pain  Good pain control = faster recovery.  It is better to take more medicine to be more active than to stay in bed all day to avoid medications.  Increase activity gradually Avoid heavy lifting at first, then increase to lifting as tolerated over the next 6 weeks. Do not "push through" the pain.  Listen to your body and avoid positions and maneuvers than reproduce the pain.  Wait a few days before trying something more intense Walking an hour a day is encouraged to help your body recover faster and more safely.  Start slowly and stop when getting sore.  If you can walk 30 minutes without stopping or pain, you can try more intense activity (running, jogging, aerobics, cycling, swimming, treadmill, sex, sports, weightlifting, etc.) Remember: If it hurts to do it, then don't do it! Use Ice and/or Heat You will have swelling and bruising  around the incisions.  This will take several weeks to resolve. Ice packs or heating pads (6-8 times a day, 30-60 minutes at a time) will help sooth soreness & bruising. Some people prefer to use ice alone, heat alone, or alternate between ice & heat.  Experiment and see what works best for you.  Consider trying ice for the first few days to help decrease swelling and bruising; then, switch to heat to help relax sore spots and speed recovery. Shower every day.  Short baths are fine.  It feels good!  Keep the incisions and wounds clean with soap & water.   Try Gentle Massage and/or Stretching Massage at the area of pain many times a day Stop if you feel pain - do not overdo it Take over the counter pain medication This helps the muscle and nerve tissues become less irritable and calm down faster Choose ONE of the following over-the-counter anti-inflammatory medications: Acetaminophen 500mg  tabs (Tylenol) 1-2 pills with every meal and just before bedtime (avoid if you have liver problems  or if you have acetaminophen in you narcotic prescription) Naproxen 220mg  tabs (ex. Aleve, Naprosyn) 1-2 pills twice a day (avoid if you have kidney, stomach, IBD, or bleeding problems) Ibuprofen 200mg  tabs (ex. Advil, Motrin) 3-4 pills with every meal and just before bedtime (avoid if you have kidney, stomach, IBD, or bleeding problems) Take with food/snack several times a day as directed for at least 2 weeks to help keep pain / soreness down & more manageable. Take Narcotic prescription pain medication for more severe pain A prescription for strong pain control is often given to you upon discharge (for example: oxycodone/Percocet, hydrocodone/Norco/Vicodin, or tramadol/Ultram) Take your pain medication as prescribed. Be mindful that most narcotic prescriptions contain Tylenol (acetaminophen) as well - avoid taking too much Tylenol. If you are having problems/concerns with the prescription medicine (does not control  pain, nausea, vomiting, rash, itching, etc.), please call us [phone] to see if we need to switch you to a different pain medicine that will work better for you and/or control your side effects better. If you need a refill on your pain medication, you must call the office before 4 pm and on weekdays only.  By federal law, prescriptions for narcotics cannot be called into a pharmacy.  They must be filled out on paper & picked up from our office by the patient or authorized caretaker.  Prescriptions cannot be filled after 4 pm nor on weekends.    WHEN TO CALL us [phone] Severe uncontrolled or worsening pain  Fever over 101 F (38.5 C) Concerns with the incision: Worsening pain, redness, rash/hives, swelling, bleeding, or drainage Reactions / problems with new medications (itching, rash, hives, nausea, etc.) Nausea and/or vomiting Difficulty urinating Difficulty breathing Worsening fatigue, dizziness, lightheadedness, blurred vision Other concerns If you are not getting better after two weeks or are noticing you are getting worse, contact our office [phone] for further advice.  We may need to adjust your medications, re-evaluate you in the office, send you to the emergency room, or see what other things we can do to help. The clinic staff is available to answer your questions during regular business hours (8:30am-5pm).  Please don't hesitate to call and ask to speak to one of our nurses for clinical concerns.    A surgeon from [facility] is always on call at the hospitals 24 hours/day If you have a medical emergency, go to the nearest emergency room or call 911.  FOLLOW UP in our office One the day of your discharge from the hospital (or the next business weekday), please call [facility] to set up or confirm an appointment to see your surgeon in the office for a follow-up appointment.  Usually it is 2-3 weeks after your surgery.   If you have skin  staples at your incision(s), let the office know so we can set up a time in the office for the nurse to remove them (usually around 10 days after surgery). Make sure that you call for appointments the day of discharge (or the next business weekday) from the hospital to ensure a convenient appointment time. IF YOU HAVE DISABILITY OR FAMILY LEAVE FORMS, BRING THEM TO THE OFFICE FOR PROCESSING.  DO NOT GIVE THEM TO YOUR DOCTOR.  [facility] [address], [address], [place], [place]  [address] ? [phone] - Main [phone] - Toll Free,  [phone] - Fax [email or web address]    GETTING TO GOOD BOWEL HEALTH. It is expected for  your digestive tract to need a few months to get back to normal.  It is common for your bowel movements and stools to be irregular.  You will have occasional bloating and cramping that should eventually fade away.  Until you are eating solid food normally, off all pain medications, and back to regular activities; your bowels will not be normal.   Avoiding constipation The goal: ONE SOFT BOWEL MOVEMENT A DAY!    Drink plenty of fluids.  Choose water first. TAKE A FIBER SUPPLEMENT EVERY DAY THE REST OF YOUR LIFE During your first week back home, gradually add back a fiber supplement every day Experiment which form you can tolerate.   There are many forms such as powders, tablets, wafers, gummies, etc Psyllium bran (Metamucil), methylcellulose (Citrucel), Miralax or Glycolax, Benefiber, Flax Seed.  Adjust the dose week-by-week (1/2 dose/day to 6 doses a day) until you are moving your bowels 1-2 times a day.  Cut back the dose or try a different fiber product if it is giving you problems such as diarrhea or bloating. Sometimes a laxative is needed to help jump-start bowels if constipated until the fiber supplement can help regulate your bowels.  If you are tolerating eating & you are farting, it is okay to try a gentle laxative such as  double dose MiraLax, prune juice, or Milk of Magnesia.  Avoid using laxatives too often. Stool softeners can sometimes help counteract the constipating effects of narcotic pain medicines.  It can also cause diarrhea, so avoid using for too long. If you are still constipated despite taking fiber daily, eating solids, and a few doses of laxatives, call our office. Controlling diarrhea Try drinking liquids and eating bland foods for a few days to avoid stressing your intestines further. Avoid dairy products (especially milk & ice cream) for a short time.  The intestines often can lose the ability to digest lactose when stressed. Avoid foods that cause gassiness or bloating.  Typical foods include beans and other legumes, cabbage, broccoli, and dairy foods.  Avoid greasy, spicy, fast foods.  Every person has some sensitivity to other foods, so listen to your body and avoid those foods that trigger problems for you. Probiotics (such as active yogurt, Align, etc) may help repopulate the intestines and colon with normal bacteria and calm down a sensitive digestive tract Adding a fiber supplement gradually can help thicken stools by absorbing excess fluid and retrain the intestines to act more normally.  Slowly increase the dose over a few weeks.  Too much fiber too soon can backfire and cause cramping & bloating. It is okay to try and slow down diarrhea with a few doses of antidiarrheal medicines.   Bismuth subsalicylate (ex. Kayopectate, Pepto Bismol) for a few doses can help control diarrhea.  Avoid if pregnant.   Loperamide (Imodium) can slow down diarrhea.  Start with one tablet (2mg ) first.  Avoid if you are having fevers or severe pain.  ILEOSTOMY PATIENTS WILL HAVE CHRONIC DIARRHEA since their colon is not in use.    Drink plenty of liquids.  You will need to drink even more glasses of water/liquid a day to avoid getting dehydrated. Record output from your ileostomy.  Expect to empty the bag every 3-4  hours at first.  Most people with a permanent ileostomy empty their bag 4-6 times at the least.   Use antidiarrheal medicine (especially Imodium) several times a day to avoid getting dehydrated.  Start with a dose at bedtime & breakfast.  Adjust up or down as needed.  Increase antidiarrheal medications as directed to avoid emptying the bag more than 8 times a day (every 3 hours). Work with your wound ostomy nurse to learn care for your ostomy.  See ostomy care instructions. TROUBLESHOOTING IRREGULAR BOWELS 1) Start with a soft & bland diet. No spicy, greasy, or fried foods.  2) Avoid gluten/wheat or dairy products from diet to see if symptoms improve. 3) Miralax 17gm or flax seed mixed in 8oz. water or juice-daily. May use 2-4 times a day as needed. 4) Gas-X, Phazyme, etc. as needed for gas & bloating.  5) Prilosec (omeprazole) over-the-counter as needed 6)  Consider probiotics (Align, Activa, etc) to help calm the bowels down  Call your doctor if you are getting worse or not getting better.  Sometimes further testing (cultures, endoscopy, X-ray studies, CT scans, bloodwork, etc.) may be needed to help diagnose and treat the cause of the diarrhea. [facility] [address], [place], [place]  [address] [phone] - Main.    [phone]  - Toll Free.   [phone] - Fax [email or web address]    #######################################################  Ostomy Support Information  You've heard that people get along just fine with only one of their eyes, or one of their lungs, or one of their kidneys. But you also know that you have only one intestine and only one bladder, and that leaves you feeling awfully empty, both physically and emotionally: You think no other people go around without part of their intestine with the ends of their intestines sticking out through their abdominal walls.   YOU ARE NOT ALONE.  There are nearly three quarters  of a million people in the [place] who have an ostomy; people who have had surgery to remove all or part of their colons or bladders.   There is even a national association, the [place] [employer] [place] with over 350 local affiliated support groups that are organized by volunteers who provide peer support and counseling. Cheral Marker has a toll free telephone num-ber, [phone] and an educational, interactive website, www.ostomy.org   An ostomy is an opening in the belly (abdominal wall) made by surgery. Ostomates are people who have had this procedure. The opening (stoma) allows the kidney or bowel to grdischarge waste. An external pouch covers the stoma to collect waste. Pouches are are a simple bag and are odor free. Different companies have disposable or reusable pouches to fit one's lifestyle. An ostomy can either be temporary or permanent.   THERE ARE THREE MAIN TYPES OF OSTOMIES Colostomy. A colostomy is a surgically created opening in the large intestine (colon). Ileostomy. An ileostomy is a surgically created opening in the small intestine. Urostomy. A urostomy is a surgically created opening to divert urine away from the bladder.  OSTOMY Care  The following guidelines will make care of your colostomy easier. Keep this information close by for quick reference.  Helpful DIET hints Eat a well-balanced diet including vegetables and fresh fruits. Eat on a regular schedule.  Drink at least 6 to 8 glasses of fluids daily. Eat slowly in a relaxed atmosphere. Chew your food thoroughly. Avoid chewing gum, smoking, and drinking from a straw. This will help decrease the amount of air you swallow, which may help reduce gas. Eating yogurt or drinking buttermilk may help reduce gas.  To control gas at night, do not eat after 8 p.m. This will give your bowel time to quiet down before you  go to bed.  If gas is a problem, you can purchase Beano. Sprinkle Beano on the first bite of food before  eating to reduce gas. It has no flavor and should not change the taste of your food. You can buy Beano over the counter at your local drugstore.  Foods like fish, onions, garlic, broccoli, asparagus, and cabbage produce odor. Although your pouch is odor-proof, if you eat these foods you may notice a stronger odor when emptying your pouch. If this is a concern, you may want to limit these foods in your diet.  If you have an ileostomy, you will have chronic diarrhea & need to drink more liquids to avoid getting dehydrated.  Consider antidiarrheal medicine like imodium (loperamide) or Lomotil to help slow down bowel movements / diarrhea into your ileostomy bag.  GETTING TO GOOD BOWEL HEALTH WITH AN ILEOSTOMY    With the colon bypassed & not in use, you will have small bowel diarrhea.   It is important to thicken & slow your bowel movements down.   The goal: 4-6 small BOWEL MOVEMENTS A DAY It is important to drink plenty of liquids to avoid getting dehydrated  CONTROLLING ILEOSTOMY DIARRHEA  TAKE A FIBER SUPPLEMENT (FiberCon or Benefiner soluble fiber) twice a day - to thicken stools by absorbing excess fluid and retrain the intestines to act more normally.  Slowly increase the dose over a few weeks.  Too much fiber too soon can backfire and cause cramping & bloating.  TAKE AN IRON SUPPLEMENT twice a day to naturally constipate your bowels.  Usually ferrous sulfate 325mg  twice a day)  TAKE ANTI-DIARRHEAL MEDICINES: Loperamide (Imodium) can slow down diarrhea.  Start with two tablets (= 4mg ) first and then try one tablet every 6 hours.  Can go up to 2 pills four times day (8 pills of 2mg  max) Avoid if you are having fevers or severe pain.  If you are not better or start feeling worse, stop all medicines and call your doctor for advice LoMotil (Diphenoxylate / Atropine) is another medicine that can constipate & slow down bowel moevements Pepto Bismol (bismuth) can gently thicken bowels as well  If  diarrhea is worse,: drink plenty of liquids and try simpler foods for a few days to avoid stressing your intestines further. Avoid dairy products (especially milk & ice cream) for a short time.  The intestines often can lose the ability to digest lactose when stressed. Avoid foods that cause gassiness or bloating.  Typical foods include beans and other legumes, cabbage, broccoli, and dairy foods.  Every person has some sensitivity to other foods, so listen to our body and avoid those foods that trigger problems for you.Call your doctor if you are getting worse or not better.  Sometimes further testing (cultures, endoscopy, X-ray studies, bloodwork, etc) may be needed to help diagnose and treat the cause of the diarrhea. Take extra anti-diarrheal medicines (maximum is 8 pills of 2mg  loperamide a day)   Tips for POUCHING an OSTOMY   Changing Your Pouch The best time to change your pouch is in the morning, before eating or drinking anything. Your stoma can function at any time, but it will function more after eating or drinking.   Applying the pouching system  Place all your equipment close at hand before removing your pouch.  Wash your hands.  Stand or sit in front of a mirror. Use the position that works best for you. Remember that you must keep the skin around the  stoma wrinkle-free for a good seal.  Gently remove the used pouch (1-piece system) or the pouch and old wafer (2-piece system). Empty the pouch into the toilet. Save the closure clip to use again.  Wash the stoma itself and the skin around the stoma. Your stoma may bleed a little when being washed. This is normal. Rinse and pat dry. You may use a wash cloth or soft paper towels (like Bounty), mild soap (like Dial, Safeguard, or [place]), and water. Avoid soaps that contain perfumes or lotions.  For a new pouch (1-piece system) or a new wafer (2-piece system), measure your stoma using the stoma guide in each box of supplies.  Trace  the shape of your stoma onto the back of the new pouch or the back of the new wafer. Cut out the opening. Remove the paper backing and set it aside.  Optional: Apply a skin barrier powder to surrounding skin if it is irritated (bare or weeping), and dust off the excess. Optional: Apply a skin-prep wipe (such as Skin Prep or All-Kare) to the skin around the stoma, and let it dry. Do not apply this solution if the skin is irritated (red, tender, or broken) or if you have shaved around the stoma. Optional: Apply a skin barrier paste (such as Stomahesive, Coloplast, or Premium) around the opening cut in the back of the pouch or wafer. Allow it to dry for 30 to 60 seconds.  Hold the pouch (1-piece system) or wafer (2-piece system) with the sticky side toward your body. Make sure the skin around the stoma is wrinkle-free. Center the opening on the stoma, then press firmly to your abdomen (Fig. 4). Look in the mirror to check if you are placing the pouch, or wafer, in the right position. For a 2-piece system, snap the pouch onto the wafer. Make sure it snaps into place securely.  Place your hand over the stoma and the pouch or wafer for about 30 seconds. The heat from your hand can help the pouch or wafer stick to your skin.  Add deodorant (such as Super Banish or Nullo) to your pouch. Other options include food extracts such as vanilla oil and peppermint extract. Add about 10 drops of the deodorant to the pouch. Then apply the closure clamp. Note: Do not use toxic  chemicals or commercial cleaning agents in your pouch. These substances may harm the stoma.  Optional: For extra seal, apply tape to all 4 sides around the pouch or wafer, as if you were framing a picture. You may use any brand of medical adhesive tape. Change your pouch every 5 to 7 days. Change it immediately if a leak occurs.  Wash your hands afterwards.  If you are wearing a 2-piece system, you may use 2 new pouches per week and alternate  them. Rinse the pouch with mild soap and warm water and hang it to dry for the next day. Apply the fresh pouch. Alternate the 2 pouches like this for a week. After a week, change the wafer and begin with 2 new pouches. Place the old pouches in a plastic bag, and put them in the trash.   LIVING WITH AN OSTOMY  Emptying Your Pouch Empty your pouch when it is one-third full (of urine, stool, and/or gas). If you wait until your pouch is fuller than this, it will be more difficult to empty and more noticeable. When you empty your pouch, either put toilet paper in the toilet bowl first, or flush  the toilet while you empty the pouch. This will reduce splashing. You can empty the pouch between your legs or to one side while sitting, or while standing or stooping. If you have a 2-piece system, you can snap off the pouch to empty it. Remember that your stoma may function during this time. If you wish to rinse your pouch after you empty it, a [place] baster can be helpful. When using a baster, squirt water up into the pouch through the opening at the bottom. With a 2-piece system, you can snap off the pouch to rinse it. After rinsing  your pouch, empty it into the toilet. When rinsing your pouch at home, put a few granules of Dreft soap in the rinse water. This helps lubricate and freshen your pouch. The inside of your pouch can be sprayed with non-stick cooking oil (Pam spray). This may help reduce stool sticking to the inside of the pouch.  Bathing You may shower or bathe with your pouch on or off. Remember that your stoma may function during this time.  The materials you use to wash your stoma and the skin around it should be clean, but they do not need to be sterile.  Wearing Your Pouch During hot weather, or if you perspire a lot in general, wear a cover over your pouch. This may prevent a rash on your skin under the pouch. Pouch covers are sold at ostomy supply stores. Wear the pouch inside your  underwear for better support. Watch your weight. Any gain or loss of 10 to 15 pounds or more can change the way your pouch fits.  Going Away From Home A collapsible cup (like those that come in travel kits) or a soft plastic squirt bottle with a pull-up top (like a travel bottle for shampoo) can be used for rinsing your pouch when you are away from home. Tilt the opening of the pouch at an upward angle when using a cup to rinse.  Carry wet wipes or extra tissues to use in public bathrooms.  Carry an extra pouching system with you at all times.  Never keep ostomy supplies in the glove compartment of your car. Extreme heat or cold can damage the skin barriers and adhesive wafers on the pouch.  When you travel, carry your ostomy supplies with you at all times. Keep them within easy reach. Do not pack ostomy supplies in baggage that will be checked or otherwise separated from you, because your baggage might be lost. If you're traveling out of the country, it is helpful to have a letter stating that you are carrying ostomy supplies as a medical necessity.  If you need ostomy supplies while traveling, look in the yellow pages of the telephone book under "Surgical Supplies." Or call the local ostomy organization to find out where supplies are available.  Do not let your ostomy supplies get low. Always order new pouches before you use the last one.  Reducing Odor Limit foods such as broccoli, cabbage, onions, fish, and garlic in your diet to help reduce odor. Each time you empty your pouch, carefully clean the opening of the pouch, both inside and outside, with toilet paper. Rinse your pouch 1 or 2 times daily after you empty it (see directions for emptying your pouch and going away from home). Add deodorant (such as Super Banish or Nullo) to your pouch. Use air deodorizers in your bathroom. Do not add aspirin to your pouch. Even though aspirin can help prevent odor, it  could cause ulcers on your  stoma.  When to call the doctor Call the doctor if you have any of the following symptoms: Purple, black, or white stoma Severe cramps lasting more than 6 hours Severe watery discharge from the stoma lasting more than 6 hours No output from the colostomy for 3 days Excessive bleeding from your stoma Swelling of your stoma to more than 1/2-inch larger than usual Pulling inward of your stoma below skin level Severe skin irritation or deep ulcers Bulging or other changes in your abdomen  When to call your ostomy nurse Call your ostomy/enterostomal therapy (WOCN) nurse if any of the following occurs: Frequent leaking of your pouching system Change in size or appearance of your stoma, causing discomfort or problems with your pouch Skin rash or rawness Weight gain or loss that causes problems with your pouch     FREQUENTLY ASKED QUESTIONS   Why haven't you met any of these folks who have an ostomy?  Well, maybe you have! You just did not recognize them because an ostomy doesn't show. It can be kept secret if you wish. Why, maybe some of your best friends, office associates or neighbors have an ostomy ... you never can tell. People facing ostomy surgery have many quality-of-life questions like: Will you bulge? Smell? Make noises? Will you feel waste leaving your body? Will you be a captive of the toilet? Will you starve? Be a social outcast? Get/stay married? Have babies? Easily bathe, go swimming, bend over?  OK, let's look at what you can expect:   Will you bulge?  Remember, without part of the intestine or bladder, and its contents, you should have a flatter tummy than before. You can expect to wear, with little exception, what you wore before surgery ... and this in-cludes tight clothing and bathing suits.   Will you smell?  Today, thanks to modern odor proof pouching systems, you can walk into an ostomy support group meeting and not smell anything that is foul or offensive. And, for  those with an ileostomy or colostomy who are concerned about odor when emptying their pouch, there are in-pouch deodorants that can be used to eliminate any waste odors that may exist.   Will you make noises?  Everyone produces gas, especially if they are an air-swallower. But intestinal sounds that occur from time to time are no differ-ent than a gurgling tummy, and quite often your clothing will muffle any sounds.   Will you feel the waste discharges?  For those with a colostomy or ileostomy there might be a slight pressure when waste leaves your body, but understand that the intestines have no nerve endings, so there will be no unpleasant sensations. Those with a urostomy will probably be unaware of any kidney drainage.   Will you be a captive of the toilet?  Immediately post-op you will spend more time in the bathroom than you will after your body recovers from surgery. Every person is different, but on average those with an ileostomy or urostomy may empty their pouches 4 to 6 times a day; a little  less if you have a colostomy. The average wear time between pouch system changes is 3 to 5 days and the changing process should take less than 30 minutes.   Will I need to be on a special diet? Most people return to their normal diet when they have recovered from surgery. Be sure to chew your food well, eat a well-balanced diet and drink plenty of fluids.  If you experience problems with a certain food, wait a couple of weeks and try it again.  Will there be odor and noises? Pouching systems are designed to be odor-proof or odor-resistant. There are deodorants that can be used in the pouch. Medications are also available to help reduce odor. Limit gas-producing foods and carbonated beverages. You will experience less gas and fewer noises as you heal from surgery.  How much time will it take to care for my ostomy? At first, you may spend a lot of time learning about your ostomy and how to take care  of it. As you become more comfortable and skilled at changing the pouching system, it will take very little time to care for it.   Will I be able to return to work? People with ostomies can perform most jobs. As soon as you have healed from surgery, you should be able to return to work. Heavy lifting (more than 10 pounds) may be discouraged.   What about intimacy? Sexual relationships and intimacy are important and fulfilling aspects of your life. They should continue after ostomy surgery. Intimacy-related concerns should be discussed openly between you and your partner.   Can I wear regular clothing? You do not need to wear special clothing. Ostomy pouches are fairly flat and barely noticeable. Elastic undergarments will not hurt the stoma or prevent the ostomy from functioning.   Can I participate in sports? An ostomy should not limit your involvement in sports. Many people with ostomies are runners, skiers, swimmers or participate in other active lifestyles. Talk with your caregiver first before doing heavy physical activity.  Will you starve?  Not if you follow doctor's orders at each stage of your post-op adjustment. There is no such thing as an "ostomy diet". Some people with an ostomy will be able to eat and tolerate anything; others may find diffi-culty with some foods. Each person is an individual and must determine, by trial, what is best for them. A good practice for all is to drink plenty of water.   Will you be a social outcast?  Have you met anyone who has an ostomy and is a social outcast? Why should you be the first? Only your attitude and self image will effect how you are treated. No confi-dent person is an Investment banker, corporate.    PROFESSIONAL HELP   Resources are available if you need help or have questions about your ostomy.   Specially trained nurses called Wound, Ostomy Continence Nurses (WOCN) are available for consultation in most major medical centers.  Consider getting an  ostomy consult at an outpatient ostomy clinic.   [place] has an Ostomy Clinic run by an Chartered certified accountant at the [facility] campus.  [phone]. [facility] can help set up an appointment   The [employer] (UOA) is a group made up of many local chapters throughout the [place]. These local groups hold meetings and provide support to prospective and existing ostomates. They sponsor educational events and have qualified visitors to make personal or telephone visits. Contact the UOA for the chapter nearest you and for other educational publications.  More detailed information can be found in Colostomy Guide, a publication of the [employer] (UOA). Contact UOA at 1-(812) 722-0311 or visit their web site at YellowSpecialist.at. The website contains links to other sites, suppliers and resources.  Media planner Start Services: Start at the website to enlist for support.  Your Wound Ostomy (WOCN) nurse may have started  this process. [email or web address]/securestart Secure Start services are designed to support people as they live their lives with an ostomy or neurogenic bladder. Enrolling is easy and at no cost to the patient. We realize that each person's needs and life journey are different. Through Secure Start services, we want to help people live their life, their way.  #######################################################

## 2023-05-06 NOTE — Transfer of Care (Signed)
Immediate Anesthesia Transfer of Care Note  Patient: Markon Tardio Arbaugh  Procedure(s) Performed: XI ROBOTIC ASSISTED LOWER ANTERIOR RECTOSIGMOID RESECTION, TAKEDOWN OF OLD COLOSTOMY AND NEW DIVERTING LOOPS COLOSTOMY, SIGMOIDOSCOPY CYSTOSCOPY with FIREFLY INJECTION  Patient Location: PACU  Anesthesia Type:General  Level of Consciousness: awake and alert   Airway & Oxygen Therapy: Patient Spontanous Breathing and Patient connected to face mask oxygen  Post-op Assessment: Report given to RN and Post -op Vital signs reviewed and stable  Post vital signs: Reviewed and stable  Last Vitals:  Vitals Value Taken Time  BP 141/76 05/06/23 1130  Temp    Pulse 58 05/06/23 1135  Resp 12 05/06/23 1135  SpO2 98 % 05/06/23 1135  Vitals shown include unvalidated device data.  Last Pain:  Vitals:   05/06/23 0554  TempSrc: Oral         Complications: No notable events documented.

## 2023-05-06 NOTE — Anesthesia Procedure Notes (Signed)
Procedure Name: Intubation Date/Time: 05/06/2023 7:46 AM  Performed by: Elisabeth Cara, CRNAPre-anesthesia Checklist: Patient identified, Emergency Drugs available, Suction available, Patient being monitored and Timeout performed Patient Re-evaluated:Patient Re-evaluated prior to induction Oxygen Delivery Method: Circle system utilized Preoxygenation: Pre-oxygenation with 100% oxygen Induction Type: IV induction Ventilation: Mask ventilation without difficulty and Oral airway inserted - appropriate to patient size Laryngoscope Size: Mac and 4 Grade View: Grade I Tube type: Oral Tube size: 7.5 mm Number of attempts: 1 Airway Equipment and Method: Stylet Placement Confirmation: ETT inserted through vocal cords under direct vision, positive ETCO2 and breath sounds checked- equal and bilateral Secured at: 22 cm Tube secured with: Tape Dental Injury: Teeth and Oropharynx as per pre-operative assessment

## 2023-05-06 NOTE — Op Note (Signed)
Preoperative diagnosis:  Rectal mass Postoperative diagnosis:  Same   Procedure: Cystoscopy Instillation of ureteral firefly constrast   Surgeon: Crist Fat, MD   Anesthesia: General   Complications: None   Intraoperative findings: large capacity bladder, orthotopic ureteral orifice ease, no significant other abnormality.   EBL: Minimal   Specimens: None   Indication:  [name] [name]  is a 77 y.o.  patient with perforated rectal cancer.  Dr. Michaell Cowing requested cystoscopy and instillation of firefly contrast to help facilitate the dissection of the sigmoid colon.  After reviewing the management options for treatment, he elected to proceed with the above surgical procedure(s). We have discussed the potential benefits and risks of the procedure, side effects of the proposed treatment, the likelihood of the patient achieving the goals of the procedure, and any potential problems that might occur during the procedure or recuperation. Informed consent has been obtained.   Description of procedure:   The patient was taken to the operating room and general anesthesia was induced.  The patient was placed in the dorsal lithotomy position, prepped and draped in the usual sterile fashion, and preoperative antibiotics were administered. A preoperative time-out was performed.    A 21 French 30 degree cystoscope was gently passed through the patient's urethra into the bladder.  The bladder was subsequently emptied and then filled slowly up performing a 360 degrees cystoscopic evaluation.  This demonstrated orthotopic ureteral orifices, normal bladder mucosa with an area of heaped mucosa and bullous edema in the dome -  evidence of colovesical fistula without mucosal abnormality.   I then advanced a 5 [place] open-ended ureteral catheter into the patient's left ureteral orifice and  I then advanced the catheter up into the proximal ureter and then slowly pulled back and injected 7.89ml of the  firefly contrast.  Subsequently turned my attention to the patient's right ureteral orifice and performed a similar task.   I then  placed a 16 [place] Foley.    The surgery was then turned over to Dr. Michaell Cowing for facilitation of the remainder of the case.

## 2023-05-06 NOTE — Consult Note (Addendum)
WOC team consulted for new ileostomy; surgery performed today.  I will plan to perform the initial pouch change and teaching session tomorrow. Pt is familiar with pouch application and emptying since he previously had a colostomy. Mardee Postin MSN, RN, CWOCN, [place], CNS [phone] .

## 2023-05-06 NOTE — Anesthesia Postprocedure Evaluation (Signed)
Anesthesia Post Note  Patient: [name] [name]  Procedure(s) Performed: XI ROBOTIC ASSISTED LOWER ANTERIOR RECTOSIGMOID RESECTION, TAKEDOWN OF OLD COLOSTOMY AND NEW DIVERTING LOOPS COLOSTOMY, SIGMOIDOSCOPY CYSTOSCOPY with FIREFLY INJECTION     Patient location during evaluation: PACU Anesthesia Type: General Level of consciousness: sedated and patient cooperative Pain management: pain level controlled Vital Signs Assessment: post-procedure vital signs reviewed and stable Respiratory status: spontaneous breathing Cardiovascular status: stable Anesthetic complications: no   No notable events documented.  Last Vitals:  Vitals:   05/06/23 1305 05/06/23 1409  BP: 133/71 (!) 123/56  Pulse: (!) 59 61  Resp: 14 14  Temp: 36.4 C 36.8 C  SpO2: 94% 98%    Last Pain:  Vitals:   05/06/23 1409  TempSrc: Oral  PainSc:                  Lewie Loron

## 2023-05-07 ENCOUNTER — Encounter (HOSPITAL_COMMUNITY): Payer: Self-pay | Admitting: Surgery

## 2023-05-07 LAB — BASIC METABOLIC PANEL
Anion gap: 8 (ref 5–15)
BUN: 18 mg/dL (ref 8–23)
CO2: 24 mmol/L (ref 22–32)
Calcium: 8.5 mg/dL — ABNORMAL LOW (ref 8.9–10.3)
Chloride: 104 mmol/L (ref 98–111)
Creatinine, Ser: 0.93 mg/dL (ref 0.61–1.24)
GFR, Estimated: >60 mL/min (ref >60)
Glucose, Bld: 225 mg/dL — ABNORMAL HIGH (ref 70–99)
Potassium: 3.9 mmol/L (ref 3.5–5.1)
Sodium: 136 mmol/L (ref 135–145)

## 2023-05-07 LAB — SURGICAL PATHOLOGY

## 2023-05-07 LAB — CBC
HCT: 30.1 % — ABNORMAL LOW (ref 39.0–52.0)
Hemoglobin: 10.1 g/dL — ABNORMAL LOW (ref 13.0–17.0)
MCH: 33.4 pg (ref 26.0–34.0)
MCHC: 33.6 g/dL (ref 30.0–36.0)
MCV: 99.7 fL (ref 80.0–100.0)
Platelets: 130 10*3/uL — ABNORMAL LOW (ref 150–400)
RBC: 3.02 MIL/uL — ABNORMAL LOW (ref 4.22–5.81)
RDW: 14.8 % (ref 11.5–15.5)
WBC: 10.6 10*3/uL — ABNORMAL HIGH (ref 4.0–10.5)
nRBC: 0 % (ref 0.0–0.2)

## 2023-05-07 LAB — GLUCOSE, CAPILLARY
Glucose-Capillary: 190 mg/dL — ABNORMAL HIGH (ref 70–99)
Glucose-Capillary: 181 mg/dL — ABNORMAL HIGH (ref 70–99)
Glucose-Capillary: 169 mg/dL — ABNORMAL HIGH (ref 70–99)
Glucose-Capillary: 172 mg/dL — ABNORMAL HIGH (ref 70–99)

## 2023-05-07 LAB — MAGNESIUM: Magnesium: 2.1 mg/dL (ref 1.7–2.4)

## 2023-05-07 MED ORDER — TRAMADOL HCL 50 MG PO TABS: 50.0000 mg | ORAL_TABLET | Freq: Four times a day (QID) | ORAL | 0 refills | Status: DC | PRN

## 2023-05-07 NOTE — Progress Notes (Signed)
Mobility Specialist - Progress Note   05/07/23 1145  Mobility  Activity Ambulated independently in hallway  Level of Assistance Independent  Assistive Device None  Distance Ambulated (ft) 480 ft  Activity Response Tolerated well  Mobility Referral Yes  $Mobility charge 1 Mobility  Mobility Specialist Start Time (ACUTE ONLY) 1138  Mobility Specialist Stop Time (ACUTE ONLY) 1145  Mobility Specialist Time Calculation (min) (ACUTE ONLY) 7 min   Pt received in recliner and agreeable to mobility. Pt c/o pain near incisions during ambulation. No other complaints during session. Pt to bathroom after session with all needs met.    [facility]

## 2023-05-07 NOTE — Consult Note (Addendum)
WOC Nurse ostomy consult note Pt is familiar with pouching routines since he previously had a colostomy.  Surgery performed yesterday for revision to an ileostomy. Wife is not present today; patient states she performs pouch changes without assistance at home and they have been using a barrier ring and one pice flat pouch. They can continue to use the current pouching system he has at home.   Stoma type/location: Stoma is red and viable, 2 inches, above skin level Peristomal assessment: intact Output: no stool or flatus, small amt pink drainage  Ostomy pouching: Applied barrier ring and one piece pouch.  Pt is above to open and close to empty without assistance.  Reviewed differences in ileostomy output and importance of avoiding dehydration and dietary precautions.  Pt asked appropriate questions. Educational materials left at the bedside, along with 5 sets of supplies. Use supplies: barrier ring, [place] # [phone], one piece pouch [place] # [id number] Enrolled patient in [employer] DC program: No; Pt was already on a program to order supplies prior to admission. WOC team will check on the patient on Mon to determine if there is a need for further teaching at that time.  Thank-you,  Cammie Mcgee MSN, RN, CWOCN, [place], CNS [phone]

## 2023-05-07 NOTE — TOC CM/SW Note (Signed)
Transition of Care [facility]) - Inpatient Brief Assessment  Patient Details  Name: [name] [name] MRN: [id number] Date of Birth: 18-Jun-1946  [facility] CM/SW Contact:    Ewing Schlein, LCSW Phone Number: 05/07/2023, 11:46 AM  Clinical Narrative: Screening completed. No TOC needs identified as patient has previously had an ostomy and will not need HHRN at discharge.  Transition of Care Asessment: Insurance and Status: Insurance coverage has been reviewed Patient has primary care physician: Yes Home environment has been reviewed: Resides with spouse Prior level of function:: Indpendent at baseline Prior/Current Home Services: No current home services Social Determinants of Health Reivew: SDOH reviewed no interventions necessary Readmission risk has been reviewed: Yes Transition of care needs: no transition of care needs at this time

## 2023-05-07 NOTE — Progress Notes (Addendum)
05/07/2023  [name] [name] [id number] Apr 15, 1946  CARE TEAM: PCP: Miki Kins, FNP  Outpatient Care Team: Patient Care Team: Miki Kins, FNP as PCP - General (Family Medicine) Mariah Milling Tollie Pizza, MD as PCP - Cardiology (Cardiology) Mariah Milling Tollie Pizza, MD as Consulting Physician (Cardiology) Benita Gutter, RN as Oncology Nurse Navigator Orlie Dakin, Tollie Pizza, MD as Consulting Physician (Oncology) [name], MD as Consulting Physician (Colon and Rectal Surgery)  Inpatient Treatment Team: Treatment Team: Attending Provider: [name], MD; WOC Nurse: Leanna Battles, RN; Charge Nurse: Idelia Salm, RN; Technician: Rose Phi, NT; Pharmacist: Armandina Stammer, [facility]; Technician: Christell Constant, NT; Pharmacist: Hessie Knows, [facility]   Problem List:   Principal Problem:   Rectal adenocarcinoma [facility]) Active Problems:   Hypertension associated with diabetes (HCC)   Coronary artery disease   Type 2 diabetes mellitus with diabetic polyneuropathy, without long-term current use of insulin (HCC)   Ileostomy in place (HCC)   05/06/2023  POST-OPERATIVE DIAGNOSIS:   RECTAL CANCER COLON OBSTRUCTION DUE TO RECTAL CANCER S/P DIVERTING LOOP COLOSTOMY   PROCEDURE:   LOW ANTERIOR RECTOSIGMOID RESECTION TAKEDOWN OF LOOP COLOSTOMY WITH ANASTOMOSIS MOBILIZATION OF SPLENIC FLEXURE OF COLON DIVERTING LOOP ILEOSTOMY INTRAOPERATIVE ASSESSMENT OF TISSUE VASCULAR PERFUSION USING ICG (indocyanine green) IMMUNOFLUORESCENCE FLEXIBLE SIGMOIDOSCOPY   SURGEON:  Ardeth Sportsman, MD  OR FINDINGS:    Patient had thickened stellate scarring at peritoneal fraction at the junction tween the proximal and mid rectum.  Seem to have a decent treatment effect.  No obvious carcinomatosis.  No obvious metastatic disease on visceral parietal peritoneum or liver.  Need for loop colostomy takedown and splenic flexure mobilization to get reach.   It is a 29mm EEA anastomosis ( distal  descending colon  connected to  mid/distal rectal junction .)  It rests 7 cm from the anal verge by rigid proctoscopy.   Protective diverting loop ileostomy created.  Positioned at old left upper quadrant colostomy site with some fascial and skin closure/revision.  Proximal end is cephalad.     Assessment [facility] Stay = 1 days) 1 Day Post-Op    Recovering relatively well    Plan:  ERAS protocol  Tolerating clears.  Dysphagia 1/full liquid diet.  Perhaps advance to soft as tolerated.  Awaiting for return of bowel function.  Ileostomy care and teaching.  He is used to a diverting and first colostomy which unfortunately had a lot of issues with prolapse and some mild diarrhea.  Hopefully he will be a little better prepared to avoid high output ileostomy diarrhea.  We will see.  I have wound ostomy nursing just double check he does not need a different type of pouching and to warn him about ileostomy issues.  Family very pleasant but any extra teaching and support would be helpful.  Start with FiberCon twice daily.  Once bowel functions follow output to see if he will need iron and/or Lomotil which she needed in the past.  Set up Home Health for ileostomy care / training  Follow-up on pathology.  Suspect there is still some tumor but he definitely got some partial response.  Diabetic control.  Placed on 1 oral hypoglycemic and sliding scale insulin for breakthrough.  Okay to DC Foley since he is up and mobilizing.  Hypertension control  VTE prophylaxis- SCDs, etc  Mobilize as tolerated to help recovery  -Disposition:  Disposition:  The patient is from: Home Anticipate discharge to:  Home Anticipated Date of Discharge is:  June 24,2024   Barriers to discharge:  Consultant clearance & sign off  , Therapy assessment & Recommendations pending, and Pending Clinical improvement (more likely than not)  Patient currently is NOT MEDICALLY STABLE for discharge from the hospital from a  surgery standpoint.      I reviewed nursing notes, last 24 h vitals and pain scores, last 48 h intake and output, last 24 h labs and trends, and last 24 h imaging results.  I have reviewed this patient's available data, including medical history, events of note, test results, etc as part of my evaluation.   A significant portion of that time was spent in counseling. Care during the described time interval was provided by me.  This care required moderate level of medical decision making.  05/07/2023    Subjective: (Chief complaint)  Patient denies much pain.  Walking hallways.  Tolerating clear liquids.  No severe nausea.  Objective:  Vital signs:  Vitals:   05/06/23 1558 05/06/23 1750 05/06/23 2116 05/07/23 0430  BP: (!) 126/58 (!) 111/56 (!) 124/56 126/61  Pulse: 62 70 70 65  Resp: 14 18 16 18   Temp: 97.7 F (36.5 C) 98.6 F (37 C) 97.6 F (36.4 C) 98.2 F (36.8 C)  TempSrc: Oral  Oral Oral  SpO2: 99% 95% 98% 99%  Weight:      Height:           Intake/Output   Yesterday:  06/20 0701 - 06/21 0700 In: 3282 [P.O.:360; I.V.:2472; IV Piggyback:450] Out: 2145 [Urine:1400; Drains:490; Stool:155; Blood:100] This shift:  No intake/output data recorded.  Bowel function:  Flatus: No  BM:  No  Drain: Serosanguinous   Physical Exam:  General: Pt awake/alert in no acute distress Eyes: PERRL, normal EOM.  Sclera clear.  No icterus Neuro: CN II-XII intact w/o focal sensory/motor deficits. Lymph: No head/neck/groin lymphadenopathy Psych:  No delerium/psychosis/paranoia.  Oriented x 4 HENT: Normocephalic, Mucus membranes moist.  No thrush Neck: Supple, No tracheal deviation.  No obvious thyromegaly Chest: No pain to chest wall compression.  Good respiratory excursion.  No audible wheezing CV:  Pulses intact.  Regular rhythm.  No major extremity edema MS: Normal AROM mjr joints.  No obvious deformity  Abdomen: Soft.  Nondistended.  Mildly tender at incisions  only.  No evidence of peritonitis.  No incarcerated hernias. Left upper quadrant ileostomy pink with some edema.  No flatus or stool yet.  Ext:   No deformity.  No mjr edema.  No cyanosis Skin: No petechiae / purpurea.  No major sores.  Warm and dry    Results:   Cultures: No results found for this or any previous visit (from the past 720 hour(s)).  Labs: Results for orders placed or performed during the hospital encounter of 05/06/23 (from the past 48 hour(s))  Glucose, capillary     Status: Abnormal   Collection Time: 05/06/23  6:00 AM  Result Value Ref Range   Glucose-Capillary 150 (H) 70 - 99 mg/dL    Comment: Glucose reference range applies only to samples taken after fasting for at least 8 hours.   Comment 1 Notify RN    Comment 2 Document in Chart   Glucose, capillary     Status: Abnormal   Collection Time: 05/06/23  7:58 AM  Result Value Ref Range   Glucose-Capillary 130 (H) 70 - 99 mg/dL    Comment: Glucose reference range applies only to samples taken after fasting for at least 8 hours.  Glucose, capillary  Status: Abnormal   Collection Time: 05/06/23 11:45 AM  Result Value Ref Range   Glucose-Capillary 192 (H) 70 - 99 mg/dL    Comment: Glucose reference range applies only to samples taken after fasting for at least 8 hours.  Glucose, capillary     Status: Abnormal   Collection Time: 05/06/23  4:25 PM  Result Value Ref Range   Glucose-Capillary 199 (H) 70 - 99 mg/dL    Comment: Glucose reference range applies only to samples taken after fasting for at least 8 hours.  Glucose, capillary     Status: Abnormal   Collection Time: 05/06/23 10:17 PM  Result Value Ref Range   Glucose-Capillary 268 (H) 70 - 99 mg/dL    Comment: Glucose reference range applies only to samples taken after fasting for at least 8 hours.  Basic metabolic panel     Status: Abnormal   Collection Time: 05/07/23  3:15 AM  Result Value Ref Range   Sodium 136 135 - 145 mmol/L   Potassium 3.9  3.5 - 5.1 mmol/L   Chloride 104 98 - 111 mmol/L   CO2 24 22 - 32 mmol/L   Glucose, Bld 225 (H) 70 - 99 mg/dL    Comment: Glucose reference range applies only to samples taken after fasting for at least 8 hours.   BUN 18 8 - 23 mg/dL   Creatinine, Ser 1.61 0.61 - 1.24 mg/dL   Calcium 8.5 (L) 8.9 - 10.3 mg/dL   GFR, Estimated >09 >60 mL/min    Comment: (NOTE) Calculated using the CKD-EPI Creatinine Equation (2021)    Anion gap 8 5 - 15    Comment: Performed at [facility], [address], [place], [place] [address]  CBC     Status: Abnormal   Collection Time: 05/07/23  3:15 AM  Result Value Ref Range   WBC 10.6 (H) 4.0 - 10.5 K/uL   RBC 3.02 (L) 4.22 - 5.81 MIL/uL   Hemoglobin 10.1 (L) 13.0 - 17.0 g/dL   HCT 81.1 (L) 91.4 - 78.2 %   MCV 99.7 80.0 - 100.0 fL   MCH 33.4 26.0 - 34.0 pg   MCHC 33.6 30.0 - 36.0 g/dL   RDW 95.6 21.3 - 08.6 %   Platelets 130 (L) 150 - 400 K/uL   nRBC 0.0 0.0 - 0.2 %    Comment: Performed at [facility], [address] [place], [place] [address]  Magnesium     Status: None   Collection Time: 05/07/23  3:15 AM  Result Value Ref Range   Magnesium 2.1 1.7 - 2.4 mg/dL    Comment: Performed at [facility], [address] [place], [place] [address]    Imaging / Studies: No results found.  Medications / Allergies: per chart  Antibiotics: Anti-infectives (From admission, onward)    Start     Dose/Rate Route Frequency Ordered Stop   05/06/23 2000  cefoTEtan (CEFOTAN) 2 g in sodium chloride 0.9 % 100 mL IVPB        2 g 200 mL/hr over 30 Minutes Intravenous Every 12 hours 05/06/23 1308 05/06/23 2014   05/06/23 1400  neomycin (MYCIFRADIN) tablet 1,000 mg  Status:  Discontinued       See Hyperspace for full Linked Orders Report.   1,000 mg Oral 3 times per day 05/06/23 0540 05/06/23 0545   05/06/23 1400  metroNIDAZOLE (FLAGYL) tablet 1,000 mg  Status:  Discontinued       See  Hyperspace for full  Linked Orders Report.   1,000 mg Oral 3 times per day 05/06/23 0540 05/06/23 0545   05/06/23 0600  cefoTEtan (CEFOTAN) 2 g in sodium chloride 0.9 % 100 mL IVPB        2 g 200 mL/hr over 30 Minutes Intravenous On call to O.R. 05/06/23 0540 05/06/23 0817         Note: Portions of this report may have been transcribed using voice recognition software. Every effort was made to ensure accuracy; however, inadvertent computerized transcription errors may be present.   Any transcriptional errors that result from this process are unintentional.    Ardeth Sportsman, MD, FACS, MASCRS Esophageal, Gastrointestinal & Colorectal Surgery Robotic and Minimally Invasive Surgery  [facility] [address] [place], [place] [address] [phone] Fax [phone] Main  CONTACT INFORMATION:  Weekday (9AM-5PM): Call [facility] main office at [phone]  Weeknight (5PM-9AM) or Weekend/Holiday: Check [email or web address] (password " [email or web address]") for General Surgery [facility] coverage  (Please, do not use SecureChat as it is not reliable communication to reach operating surgeons for immediate patient care given surgeries/outpatient duties/clinic/cross-coverage/off post-call which would lead to a delay in care.  Epic staff messaging available for outptient concerns, but may not be answered for 48 hours or more).     05/07/2023  7:22 AM

## 2023-05-08 LAB — GLUCOSE, CAPILLARY
Glucose-Capillary: 169 mg/dL — ABNORMAL HIGH (ref 70–99)
Glucose-Capillary: 156 mg/dL — ABNORMAL HIGH (ref 70–99)
Glucose-Capillary: 153 mg/dL — ABNORMAL HIGH (ref 70–99)
Glucose-Capillary: 160 mg/dL — ABNORMAL HIGH (ref 70–99)

## 2023-05-08 LAB — TROPONIN I (HIGH SENSITIVITY)
Troponin I (High Sensitivity): 7 ng/L (ref <18)
Troponin I (High Sensitivity): 6 ng/L (ref <18)

## 2023-05-08 LAB — CREATININE, SERUM
Creatinine, Ser: 0.94 mg/dL (ref 0.61–1.24)
GFR, Estimated: >60 mL/min (ref >60)

## 2023-05-08 LAB — POTASSIUM: Potassium: 3.5 mmol/L (ref 3.5–5.1)

## 2023-05-08 LAB — HEMOGLOBIN: Hemoglobin: 9.5 g/dL — ABNORMAL LOW (ref 13.0–17.0)

## 2023-05-08 NOTE — Progress Notes (Signed)
2 Days Post-Op   Subjective/Chief Complaint: Complains of left chest pain   Objective: Vital signs in last 24 hours: Temp:  [97.5 F (36.4 C)-98.3 F (36.8 C)] 98.3 F (36.8 C) (06/22 1610) Pulse Rate:  [71-77] 73 (06/22 0638) Resp:  [16-19] 16 (06/22 0638) BP: (131-160)/(54-75) 160/75 (06/22 0638) SpO2:  [95 %-100 %] 96 % (06/22 9604) Weight:  [81 kg] 81 kg (06/22 0500) Last BM Date : 05/05/23  Intake/Output from previous day: 06/21 0701 - 06/22 0700 In: 1560 [P.O.:1560] Out: 1010 [Urine:700; Drains:260; Stool:50] Intake/Output this shift: No intake/output data recorded.  General appearance: alert and cooperative Resp: clear to auscultation bilaterally Cardio: regular rate and rhythm GI: soft, appropriately tender. Incisions ok. Not much out of ostomy yet  Lab Results:  Recent Labs    05/07/23 0315 05/08/23 0314  WBC 10.6*  --   HGB 10.1* 9.5*  HCT 30.1*  --   PLT 130*  --    BMET Recent Labs    05/07/23 0315 05/08/23 0314  NA 136  --   K 3.9 3.5  CL 104  --   CO2 24  --   GLUCOSE 225*  --   BUN 18  --   CREATININE 0.93 0.94  CALCIUM 8.5*  --    PT/INR No results for input(s): "LABPROT", "INR" in the last 72 hours. ABG No results for input(s): "PHART", "HCO3" in the last 72 hours.  Invalid input(s): "PCO2", "PO2"  Studies/Results: No results found.  Anti-infectives: Anti-infectives (From admission, onward)    Start     Dose/Rate Route Frequency Ordered Stop   05/06/23 2000  cefoTEtan (CEFOTAN) 2 g in sodium chloride 0.9 % 100 mL IVPB        2 g 200 mL/hr over 30 Minutes Intravenous Every 12 hours 05/06/23 1308 05/06/23 2014   05/06/23 1400  neomycin (MYCIFRADIN) tablet 1,000 mg  Status:  Discontinued       See Hyperspace for full Linked Orders Report.   1,000 mg Oral 3 times per day 05/06/23 0540 05/06/23 0545   05/06/23 1400  metroNIDAZOLE (FLAGYL) tablet 1,000 mg  Status:  Discontinued       See Hyperspace for full Linked Orders Report.    1,000 mg Oral 3 times per day 05/06/23 0540 05/06/23 0545   05/06/23 0600  cefoTEtan (CEFOTAN) 2 g in sodium chloride 0.9 % 100 mL IVPB        2 g 200 mL/hr over 30 Minutes Intravenous On call to O.R. 05/06/23 0540 05/06/23 0817       Assessment/Plan: s/p Procedure(s) with comments: XI ROBOTIC ASSISTED LOWER ANTERIOR RECTOSIGMOID RESECTION, TAKEDOWN OF OLD LOOP COLOSTOMY WITH ANASTOMOSIS, MOBILIZATION OF SPLENIC FLEXURE OF COLON, AND NEW DIVERTING LOOP ILEOSTOMY, INTRAOPERATIVE ASSESSMENT OF TISSUE VASCULAR PERFUSION USING ICG (INDOCYANINE GREEN) IMMUNOFLUORESCENCE, FLEXIBLE SIGMOIDOSCOPY (N/A) - 4.5 HOURS TOTAL INCLUDING [facility] CYSTOSCOPY with FIREFLY INJECTION (N/A) Continue liquid diet until bowel function returns Ambulate Will check EKG and troponins for chest pain  LOS: 2 days    Chevis Pretty III 05/08/2023

## 2023-05-08 NOTE — Progress Notes (Signed)
Mobility Specialist - Progress Note   05/08/23 1000  Mobility  Activity Ambulated independently in hallway  Level of Assistance Independent  Assistive Device None  Distance Ambulated (ft) 230 ft  Activity Response Tolerated well  Mobility Referral Yes  $Mobility charge 1 Mobility  Mobility Specialist Start Time (ACUTE ONLY) 0954  Mobility Specialist Stop Time (ACUTE ONLY) 0959  Mobility Specialist Time Calculation (min) (ACUTE ONLY) 5 min   Pt received in bed and agreeable to mobility. Pt had some LOB that was self corrected throughout session. No complaints during session. Pt to bathroom after session with all needs met.   [facility]

## 2023-05-09 LAB — GLUCOSE, CAPILLARY
Glucose-Capillary: 220 mg/dL — ABNORMAL HIGH (ref 70–99)
Glucose-Capillary: 255 mg/dL — ABNORMAL HIGH (ref 70–99)
Glucose-Capillary: 151 mg/dL — ABNORMAL HIGH (ref 70–99)
Glucose-Capillary: 151 mg/dL — ABNORMAL HIGH (ref 70–99)

## 2023-05-09 LAB — CREATININE, SERUM
Creatinine, Ser: 0.86 mg/dL (ref 0.61–1.24)
GFR, Estimated: >60 mL/min (ref >60)

## 2023-05-09 LAB — HEMOGLOBIN: Hemoglobin: 11.1 g/dL — ABNORMAL LOW (ref 13.0–17.0)

## 2023-05-09 LAB — POTASSIUM: Potassium: 3.8 mmol/L (ref 3.5–5.1)

## 2023-05-09 MED ORDER — SODIUM CHLORIDE 0.9 % IV SOLN: INTRAVENOUS | Status: DC

## 2023-05-09 MED ORDER — CHLORHEXIDINE GLUCONATE CLOTH 2 % EX PADS
6.0000 | MEDICATED_PAD | Freq: Every day | CUTANEOUS | Status: DC
  Administered 2023-05-09: 6 via TOPICAL

## 2023-05-09 MED ORDER — SODIUM CHLORIDE 0.9 % IV SOLN: 250.0000 mL | INTRAVENOUS | Status: DC | PRN

## 2023-05-09 MED ORDER — PANTOPRAZOLE SODIUM 40 MG IV SOLR
40.0000 mg | INTRAVENOUS | Status: DC
  Administered 2023-05-09: 40 mg via INTRAVENOUS
  Filled 2023-05-09: qty 10

## 2023-05-09 MED ORDER — MORPHINE SULFATE (PF) 2 MG/ML IV SOLN: 1.0000 mg | INTRAVENOUS | Status: DC | PRN

## 2023-05-09 NOTE — Progress Notes (Signed)
Mobility Specialist - Progress Note   05/09/23 1339  Mobility  Activity Ambulated independently in hallway  Level of Assistance Independent  Assistive Device None  Distance Ambulated (ft) 500 ft  Range of Motion/Exercises Active  Activity Response Tolerated well  Mobility Referral Yes  $Mobility charge 1 Mobility  Mobility Specialist Start Time (ACUTE ONLY) 1327  Mobility Specialist Stop Time (ACUTE ONLY) 1339  Mobility Specialist Time Calculation (min) (ACUTE ONLY) 12 min   Pt was found in bed and agreeable to ambulate. No complaints during session and at EOS returned to bed with all needs met. Call bell in reach.  Billey Chang Mobility Specialist

## 2023-05-09 NOTE — Progress Notes (Signed)
3 Days Post-Op   Subjective/Chief Complaint: Just vomited and states he doesn't feel well. Nurse says he is depressed   Objective: Vital signs in last 24 hours: Temp:  [97.7 F (36.5 C)-98.3 F (36.8 C)] 98.2 F (36.8 C) (06/23 0501) Pulse Rate:  [73-84] 73 (06/23 0501) Resp:  [16-18] 18 (06/23 0501) BP: (134-147)/(72-76) 134/72 (06/23 0501) SpO2:  [94 %-100 %] 100 % (06/23 0501) Last BM Date : 05/05/23  Intake/Output from previous day: 06/22 0701 - 06/23 0700 In: 1440 [P.O.:1440] Out: 2760 [Urine:1850; Drains:210; Stool:700] Intake/Output this shift: Total I/O In: -  Out: 50 [Drains:50]  General appearance: alert and cooperative Resp: clear to auscultation bilaterally Cardio: regular rate and rhythm GI: soft, small output from ostomy. Drain serosanguinous and leaking around drain  Lab Results:  Recent Labs    05/07/23 0315 05/08/23 0314 05/09/23 0252  WBC 10.6*  --   --   HGB 10.1* 9.5* 11.1*  HCT 30.1*  --   --   PLT 130*  --   --    BMET Recent Labs    05/07/23 0315 05/08/23 0314 05/09/23 0252  NA 136  --   --   K 3.9 3.5 3.8  CL 104  --   --   CO2 24  --   --   GLUCOSE 225*  --   --   BUN 18  --   --   CREATININE 0.93 0.94 0.86  CALCIUM 8.5*  --   --    PT/INR No results for input(s): "LABPROT", "INR" in the last 72 hours. ABG No results for input(s): "PHART", "HCO3" in the last 72 hours.  Invalid input(s): "PCO2", "PO2"  Studies/Results: No results found.  Anti-infectives: Anti-infectives (From admission, onward)    Start     Dose/Rate Route Frequency Ordered Stop   05/06/23 2000  cefoTEtan (CEFOTAN) 2 g in sodium chloride 0.9 % 100 mL IVPB        2 g 200 mL/hr over 30 Minutes Intravenous Every 12 hours 05/06/23 1308 05/06/23 2014   05/06/23 1400  neomycin (MYCIFRADIN) tablet 1,000 mg  Status:  Discontinued       See Hyperspace for full Linked Orders Report.   1,000 mg Oral 3 times per day 05/06/23 0540 05/06/23 0545   05/06/23 1400   metroNIDAZOLE (FLAGYL) tablet 1,000 mg  Status:  Discontinued       See Hyperspace for full Linked Orders Report.   1,000 mg Oral 3 times per day 05/06/23 0540 05/06/23 0545   05/06/23 0600  cefoTEtan (CEFOTAN) 2 g in sodium chloride 0.9 % 100 mL IVPB        2 g 200 mL/hr over 30 Minutes Intravenous On call to O.R. 05/06/23 0540 05/06/23 0817       Assessment/Plan: s/p Procedure(s) with comments: XI ROBOTIC ASSISTED LOWER ANTERIOR RECTOSIGMOID RESECTION, TAKEDOWN OF OLD LOOP COLOSTOMY WITH ANASTOMOSIS, MOBILIZATION OF SPLENIC FLEXURE OF COLON, AND NEW DIVERTING LOOP ILEOSTOMY, INTRAOPERATIVE ASSESSMENT OF TISSUE VASCULAR PERFUSION USING ICG (INDOCYANINE GREEN) IMMUNOFLUORESCENCE, FLEXIBLE SIGMOIDOSCOPY (N/A) - 4.5 HOURS TOTAL INCLUDING [facility] CYSTOSCOPY with FIREFLY INJECTION (N/A) Will go back to liquids with his recent vomiting Continue drain Ambulate POD 3 Ostomy care  LOS: 3 days    Chevis Pretty III 05/09/2023

## 2023-05-10 LAB — GLUCOSE, CAPILLARY
Glucose-Capillary: 164 mg/dL — ABNORMAL HIGH (ref 70–99)
Glucose-Capillary: 237 mg/dL — ABNORMAL HIGH (ref 70–99)
Glucose-Capillary: 149 mg/dL — ABNORMAL HIGH (ref 70–99)
Glucose-Capillary: 206 mg/dL — ABNORMAL HIGH (ref 70–99)

## 2023-05-10 LAB — CREATININE, SERUM
Creatinine, Ser: 0.91 mg/dL (ref 0.61–1.24)
GFR, Estimated: >60 mL/min (ref >60)

## 2023-05-10 LAB — HEMOGLOBIN: Hemoglobin: 9.9 g/dL — ABNORMAL LOW (ref 13.0–17.0)

## 2023-05-10 LAB — POTASSIUM: Potassium: 3.6 mmol/L (ref 3.5–5.1)

## 2023-05-10 MED ORDER — SODIUM CHLORIDE 0.9% FLUSH: 3.0000 mL | Freq: Two times a day (BID) | INTRAVENOUS | Status: DC

## 2023-05-10 MED ORDER — LIDOCAINE-PRILOCAINE 2.5-2.5 % EX CREA: TOPICAL_CREAM | CUTANEOUS | Status: DC | PRN

## 2023-05-10 MED ORDER — LOPERAMIDE HCL 2 MG PO CAPS: 2.0000 mg | ORAL_CAPSULE | Freq: Four times a day (QID) | ORAL | Status: DC | PRN

## 2023-05-10 MED ORDER — SODIUM CHLORIDE 0.9% FLUSH: 3.0000 mL | INTRAVENOUS | Status: DC | PRN

## 2023-05-10 MED ORDER — VITAMIN B-12 1000 MCG PO TABS: 500.0000 ug | ORAL_TABLET | Freq: Every day | ORAL | Status: DC

## 2023-05-10 MED ORDER — PIOGLITAZONE HCL 15 MG PO TABS
15.0000 mg | ORAL_TABLET | Freq: Every day | ORAL | Status: DC
  Administered 2023-05-10 – 2023-05-11 (×2): 15 mg via ORAL
  Filled 2023-05-10 (×2): qty 1

## 2023-05-10 MED ORDER — SODIUM CHLORIDE 0.9 % IV SOLN: 250.0000 mL | INTRAVENOUS | Status: DC | PRN

## 2023-05-10 MED ORDER — LOPERAMIDE HCL 2 MG PO CAPS: 2.0000 mg | ORAL_CAPSULE | Freq: Two times a day (BID) | ORAL | Status: DC

## 2023-05-10 MED ORDER — LACTATED RINGERS IV BOLUS: 1000.0000 mL | Freq: Three times a day (TID) | INTRAVENOUS | Status: DC | PRN

## 2023-05-10 MED ORDER — DIPHENOXYLATE-ATROPINE 2.5-0.025 MG PO TABS
1.0000 | ORAL_TABLET | Freq: Two times a day (BID) | ORAL | Status: DC
  Administered 2023-05-10 – 2023-05-11 (×3): 1 via ORAL
  Filled 2023-05-10 (×3): qty 1

## 2023-05-10 MED ORDER — FERROUS SULFATE 325 (65 FE) MG PO TABS
325.0000 mg | ORAL_TABLET | Freq: Two times a day (BID) | ORAL | Status: DC
  Administered 2023-05-10 – 2023-05-11 (×2): 325 mg via ORAL
  Filled 2023-05-10 (×2): qty 1

## 2023-05-10 NOTE — Progress Notes (Signed)
Patient status post robotic low anterior rectosigmoid resection for obstructing rectal cancer  Pathology consistent with ypT3 ypN0 stage II cancer, downgraded.  Need to add on GI tumor board to discuss but suspect no extra post adjuvant chemotherapy needed. I told the pt the good news

## 2023-05-10 NOTE — Consult Note (Signed)
WOC Nurse ostomy follow up Stoma type/location: diverting loop ileostomy  Stomal assessment/size: 1 3/4" oval shaped, os at 5-6 o'clock and os at 11-12 o'clock  Peristomal assessment: MARSI (medical adhesive skin injury; ruptured serous blister from tape tension, old stoma site with sutures, under tape border of current pouch  Treatment options for stomal/peristomal skin: 2" skin barrier ring; no sting skin prep  Output: liquid, brown  Ostomy pouching: 2pc. 2 3/4 with 2" skin barrier ring  Education provided:  RISK for dehydration related to the type of stoma, replenish fluids with each time you empty pouch.  Patient and wife are independent in care of ostomy. Will use same pouching system/supplies as previously using.  Enrolled patient in [place] Secure Start Discharge program: Yes, previously has established relationship with supplier Spoke with wife via phone, he was using 2pc 2 3/4" lock and roll closure previously, will change today and place in same pouch.  4 pouches (sets) to be sent home with patient as well. Wife to order supplies, as she reports they are down to last box of skin barriers.    NEW PATTERN LEFT IN ROOM FOR WIFE  Surgeon notes indicate DC to home today  WOC Nurse team will follow with you and see patient within 10 days for wound assessments.  Please notify WOC nurses of any acute changes in the wounds or any new areas of concern Paisly F[facility]ut [facility] MSN, RN,CWOCN, CNS, CWON-AP [phone]

## 2023-05-10 NOTE — Progress Notes (Signed)
05/10/2023  [name] [name] [id number] 1946/01/14  CARE TEAM: PCP: Miki Kins, FNP  Outpatient Care Team: Patient Care Team: Miki Kins, FNP as PCP - General (Family Medicine) Mariah Milling, Tollie Pizza, MD as PCP - Cardiology (Cardiology) Mariah Milling Tollie Pizza, MD as Consulting Physician (Cardiology) Benita Gutter, RN as Oncology Nurse Navigator Orlie Dakin, Tollie Pizza, MD as Consulting Physician (Oncology) [name], MD as Consulting Physician (Colon and Rectal Surgery)  Inpatient Treatment Team: Treatment Team: Attending Provider: [name], MD; WOC Nurse: Leanna Battles, RN; Technician: Merwyn Katos, NT; Utilization Review: Jamal Maes, RN; Case Manager: Redmond Baseman, RN; Licensed Practical Nurse: Mertie Clause, LPN; Registered Nurse: Riki Sheer, RN   Problem List:   Principal Problem:   Rectal adenocarcinoma [facility]) Active Problems:   Hypertension associated with diabetes [facility])   Coronary artery disease   Type 2 diabetes mellitus with diabetic polyneuropathy, without long-term current use of insulin (HCC)   Ileostomy in place [facility])   05/06/2023  POST-OPERATIVE DIAGNOSIS:   RECTAL CANCER COLON OBSTRUCTION DUE TO RECTAL CANCER S/P DIVERTING LOOP COLOSTOMY   PROCEDURE:   LOW ANTERIOR RECTOSIGMOID RESECTION TAKEDOWN OF LOOP COLOSTOMY WITH ANASTOMOSIS MOBILIZATION OF SPLENIC FLEXURE OF COLON DIVERTING LOOP ILEOSTOMY INTRAOPERATIVE ASSESSMENT OF TISSUE VASCULAR PERFUSION USING ICG (indocyanine green) IMMUNOFLUORESCENCE FLEXIBLE SIGMOIDOSCOPY   SURGEON:  Ardeth Sportsman, MD  OR FINDINGS:    Patient had thickened stellate scarring at peritoneal fraction at the junction tween the proximal and mid rectum.  Seem to have a decent treatment effect.  No obvious carcinomatosis.  No obvious metastatic disease on visceral parietal peritoneum or liver.  Need for loop colostomy takedown and splenic flexure mobilization to get reach.   It is a 29mm  EEA anastomosis ( distal descending colon  connected to  mid/distal rectal junction .)  It rests 7 cm from the anal verge by rigid proctoscopy.   Protective diverting loop ileostomy created.  Positioned at old left upper quadrant colostomy site with some fascial and skin closure/revision.  Proximal end is cephalad.   PATHOLOGY  FINAL MICROSCOPIC DIAGNOSIS:  A. COLOSTOMY: Colostomy. Negative for malignancy.  B. RECTOSIGMOID, RESECTION: Invasive adenocarcinoma, moderately differentiated associated with extensive fibrosis. Carcinoma extends into perirectal connective tissue. All surgical margins negative for carcinoma. Ten lymph nodes negative for metastatic carcinoma (0/10). Pathologic Stage Classification (pTNM, AJCC 8th Edition): ypT3, ypN0  C. FINAL DISTAL MARGIN: Benign colon. Distal margin negative for carcinoma.    Assessment [facility] Stay = 4 days) 4 Days Post-Op    Recovering relatively well    Plan:  ERAS protocol  With ileostomy function, advance to solid diet.    Ileostomy bowel regimen.  Continue soluble fiber.  Will add oral iron.  Do loperamide twice daily and follow output.  Drain with a little bit of tan drainage.  Suspicious for mild leak, but could be drainage from some fat necrosis.  Patient not clinically deteriorating and looks great with ileus resolved.  Hard to say for certain.  Check labs and follow-up.  If persistent, may do antibiotics.  Ileostomy care and teaching.  He is used to a diverting loop colostomy which unfortunately had a lot of issues with prolapse and some mild diarrhea.  Hopefully he will be a little better prepared to avoid high output ileostomy diarrhea.  We will see.   Pathology c/w Stage II, ypT3ypN0 -clear downstage.  Suspect he will not need any extra post adjuvant chemotherapy but should add tumor board and  discussed.  Diabetic control.  Will add second oral hypoglycemic since he started to have p.o.  Continue sliding  scale insulin for breakthrough.  Hypertension control.  With his ileostomy, he functionally is on a diuretic of sorts so we will hold off on Imdur for now.  VTE prophylaxis- SCDs, etc  Mobilize as tolerated to help recovery  -Disposition:  Disposition:  The patient is from: Home Anticipate discharge to:  Home Anticipated Date of Discharge is:  June 24,2024   Barriers to discharge:  Consultant clearance & sign off  , Therapy assessment & Recommendations pending, and Pending Clinical improvement (more likely than not)  Patient currently is NOT MEDICALLY STABLE for discharge from the hospital from a surgery standpoint.      I reviewed nursing notes, last 24 h vitals and pain scores, last 48 h intake and output, last 24 h labs and trends, and last 24 h imaging results.  I have reviewed this patient's available data, including medical history, events of note, test results, etc as part of my evaluation.   A significant portion of that time was spent in counseling. Care during the described time interval was provided by me.  This care required moderate level of medical decision making.  05/10/2023    Subjective: (Chief complaint)  Patient with ileostomy diarrhea.  Had leaking of bag.  Embarrassed and disappointed.  Reseal the bag and no issues now.  Tolerating full liquids.  No nausea or vomiting.  Pain controlled.  Objective:  Vital signs:  Vitals:   05/09/23 1327 05/09/23 2137 05/10/23 0254 05/10/23 0500  BP: 117/77 (!) 142/74 (!) 144/68   Pulse: 75 73 66   Resp: 18 14 18    Temp: 97.8 F (36.6 C) 98.4 F (36.9 C) 97.9 F (36.6 C)   TempSrc: Oral Oral Oral   SpO2: 97% 96% 98%   Weight:    81 kg  Height:        Last BM Date : 05/09/23  Intake/Output   Yesterday:  06/23 0701 - 06/24 0700 In: 1918.6 [P.O.:720; I.V.:1098.6; IV Piggyback:100] Out: 470 [Urine:200; Drains:170; Stool:100] This shift:  No intake/output data recorded.  Bowel function:  Flatus:  No  BM:  No  Drain:  Mostly serosanguineous but a little bit of tan thickened fluid in tubing.  Stripped and more serosanguineous again.   Physical Exam:  General: Pt awake/alert in no acute distress Eyes: PERRL, normal EOM.  Sclera clear.  No icterus Neuro: CN II-XII intact w/o focal sensory/motor deficits. Lymph: No head/neck/groin lymphadenopathy Psych:  No delerium/psychosis/paranoia.  Oriented x 4 HENT: Normocephalic, Mucus membranes moist.  No thrush Neck: Supple, No tracheal deviation.  No obvious thyromegaly Chest: No pain to chest wall compression.  Good respiratory excursion.  No audible wheezing CV:  Pulses intact.  Regular rhythm.  No major extremity edema MS: Normal AROM mjr joints.  No obvious deformity  Abdomen: Soft.  Nondistended.  Mildly tender at incisions only.  No evidence of peritonitis.  No incarcerated hernias.  Left upper quadrant ileostomy pink with some edema.  Thin bilious effluent - large volume - in bag   Ext:   No deformity.  No mjr edema.  No cyanosis Skin: No petechiae / purpurea.  No major sores.  Warm and dry    Results:    SURGICAL PATHOLOGY  CASE: [phone] PATIENT: [name] [name] Surgical Pathology Report     Clinical History: Rectal cancer ([facility])     FINAL MICROSCOPIC DIAGNOSIS:  A. COLOSTOMY: Colostomy.  Negative for malignancy.  B. COLON, RECTOSIGMOID, RESECTION: Invasive adenocarcinoma, moderately differentiated associated with extensive fibrosis. Carcinoma extends into perirectal connective tissue. All surgical margins negative for carcinoma. Ten lymph nodes negative for metastatic carcinoma (0/10). See oncology table.  C. FINAL DISTAL MARGIN: Benign colon. Distal margin negative for carcinoma.  ONCOLOGY TABLE:   COLON AND RECTUM, CARCINOMA:  Resection Procedure: Lower anterior rectosigmoid resection Tumor Site: Rectum Tumor Size: 1 cm Macroscopic Tumor Perforation: Not identified Macroscopic Evaluation of  Mesorectum: Intact Histologic Type: Colorectal adenocarcinoma Histologic Grade: G2, moderately differentiated Multiple Primary Sites: Not applicable Tumor Extension: Into perirectal connective tissue Lymphovascular Invasion: Not identified Perineural Invasion: Not identified Treatment Effect: Present, partial response, grade 2 Margins:      Margin Status for Invasive Carcinoma: All surgical margins negative for invasive carcinoma      Distance from Invasive Carcinoma to Radial (Circumferential) Margin: 4 cm Regional Lymph Nodes:      Number of Lymph Nodes with Tumor: 0      Number of Lymph Nodes Examined: 10 Tumor Deposits: Not identified Distant Metastasis:      Distant Site(s) Involved: Not applicable Pathologic Stage Classification (pTNM, AJCC 8th Edition): ypT3, ypN0 Ancillary Studies: Can be performed if requested Representative Tumor Block: D4-B6 Comments: The area of stricture shows scattered microscopic foci of residual adenocarcinoma associated with extensive fibrosis consistent with treatment effect. (v4.2.0.1)  Kyandre Okray DESCRIPTION: A: Received fresh is a 4 cm segment of colon with a rim of skin present at one end consistent with an ostomy site.  The mucosa is glistening and tan.  A section is submitted.  B: Specimen: Rectosigmoid colon, received fresh Specimen integrity: Intact Specimen length: 21 cm, the peritoneal reflection is located 2 cm from the distal margin. Mesorectal intactness: Intact Tumor location: Rectum Tumor size: There is a stricture of the lumen located 3 cm from the distal margin which measures 1.0 cm in length.  The lumen is not probe patent at the stricture.  The mucosa proximal and distal to the stricture is edematous and tan.  The cut surface at the stricture shows dense fibrosis 6 extending into the soft tissue.  Residual tumor is not grossly identified. Percent of bowel circumference involved: 100% Tumor distance to margins:                       Proximal: 17 cm                      Distal: 3 cm                      Radial (posterior ascending, posterior descending; lateral and posterior mid-rectum; and entire lower 1/3 rectum): 4 cm Macroscopic extent of tumor invasion: Residual tumor is not grossly identified. Total presumed lymph nodes: There are 16 tan-yellow ovoid nodules grossly consistent with lymph nodes measuring 0.3 to 0.6 cm. Extramural satellite tumor nodules: Not grossly identified. Mucosal polyp(s): Not grossly identified. Additional findings: The uninvolved mucosa is glistening and tan.  There are diverticula present. Block summary: 10 blocks submitted 1 = proximal margin 2 = distal margin 3-5 = entire stricture 6, 7 = sections adjacent to stricture 8-10 = lymph nodes  C: Received fresh is a portion of colon measuring 1 cm in length and 2 cm in diameter.  The mucosa is glistening tan-red.  Sections are submitted in 1 cassette.  [facility] 05/06/2023)  Final Diagnosis performed by Jimmy Picket, MD.  Electronically signed 05/07/2023 Technical component performed at [facility], [address], [place], [place] [address].  Professional component performed at [employer]. [facility], [address], [place], [place] [address].  Immunohistochemistry Technical component (if applicable) was performed at [facility]. [address], [place], [place] [address].   IMMUNOHISTOCHEMISTRY DISCLAIMER (if applicable): Some of these immunohistochemical stains may have been developed and the performance characteristics determine by [facility]. Some may not have been cleared or approved by the U.S. Food and Drug Administration. The FDA has determined that such clearance or approval is not necessary. This test is used for clinical purposes. It should not be regarded as investigational or for research. This laboratory is certified under the Clinical  Laboratory Improvement Amendments of 1988 (CLIA-88) as qualified to perform high complexity clinical laboratory testing.  The controls stained appropriately.   IHC stains are performed on formalin fixed, paraffin embedded tissue using a 3,3"diaminobenzidine (DAB) chromogen and Leica Bond Autostainer System. The staining intensity of the nucleus is score manually and is reported as the percentage of tumor cell nuclei demonstrating specific nuclear staining. The specimens are fixed in 10% Neutral Formalin for at least 6 hours and up to 72hrs. These tests are validated on decalcified tissue. Results should be interpreted with caution given the possibility of false negative results on decalcified specimens. Antibody Clones are as follows ER-clone 25F, PR-clone 16, Ki67- clone MM1. Some of these immunohistochemical stains may have been developed and the performance characteristics determined by [facility].    Cultures: No results found for this or any previous visit (from the past 720 hour(s)).  Labs: Results for orders placed or performed during the hospital encounter of 05/06/23 (from the past 48 hour(s))  Glucose, capillary     Status: Abnormal   Collection Time: 05/08/23  7:23 AM  Result Value Ref Range   Glucose-Capillary 169 (H) 70 - 99 mg/dL    Comment: Glucose reference range applies only to samples taken after fasting for at least 8 hours.  Troponin I (High Sensitivity)     Status: None   Collection Time: 05/08/23 11:30 AM  Result Value Ref Range   Troponin I (High Sensitivity) 7 <18 ng/L    Comment: (NOTE) Elevated high sensitivity troponin I (hsTnI) values and significant  changes across serial measurements may suggest ACS but many other  chronic and acute conditions are known to elevate hsTnI results.  Refer to the "Links" section for chest pain algorithms and additional  guidance. Performed at [facility], [address], [place], [place]  [address]   Glucose, capillary     Status: Abnormal   Collection Time: 05/08/23 11:32 AM  Result Value Ref Range   Glucose-Capillary 156 (H) 70 - 99 mg/dL    Comment: Glucose reference range applies only to samples taken after fasting for at least 8 hours.  Troponin I (High Sensitivity)     Status: None   Collection Time: 05/08/23  1:20 PM  Result Value Ref Range   Troponin I (High Sensitivity) 6 <18 ng/L    Comment: (NOTE) Elevated high sensitivity troponin I (hsTnI) values and significant  changes across serial measurements may suggest ACS but many other  chronic and acute conditions are known to elevate hsTnI results.  Refer to the "Links" section for chest pain algorithms and additional  guidance. Performed at [facility], [address] [place], [place] [address]   Glucose, capillary  Status: Abnormal   Collection Time: 05/08/23  4:11 PM  Result Value Ref Range   Glucose-Capillary 153 (H) 70 - 99 mg/dL    Comment: Glucose reference range applies only to samples taken after fasting for at least 8 hours.  Glucose, capillary     Status: Abnormal   Collection Time: 05/08/23  9:19 PM  Result Value Ref Range   Glucose-Capillary 160 (H) 70 - 99 mg/dL    Comment: Glucose reference range applies only to samples taken after fasting for at least 8 hours.  Hemoglobin     Status: Abnormal   Collection Time: 05/09/23  2:52 AM  Result Value Ref Range   Hemoglobin 11.1 (L) 13.0 - 17.0 g/dL    Comment: Performed at [facility], [address] [place], [place] [address]  Potassium     Status: None   Collection Time: 05/09/23  2:52 AM  Result Value Ref Range   Potassium 3.8 3.5 - 5.1 mmol/L    Comment: Performed at [facility], [address], [place], [place] [address]  Creatinine, serum     Status: None   Collection Time: 05/09/23  2:52 AM  Result Value Ref Range   Creatinine, Ser 0.86 0.61 - 1.24 mg/dL   GFR, Estimated >25  >36 mL/min    Comment: (NOTE) Calculated using the CKD-EPI Creatinine Equation (2021) Performed at [facility], [address] [place], [place] [address]   Glucose, capillary     Status: Abnormal   Collection Time: 05/09/23  7:28 AM  Result Value Ref Range   Glucose-Capillary 220 (H) 70 - 99 mg/dL    Comment: Glucose reference range applies only to samples taken after fasting for at least 8 hours.  Glucose, capillary     Status: Abnormal   Collection Time: 05/09/23 12:20 PM  Result Value Ref Range   Glucose-Capillary 255 (H) 70 - 99 mg/dL    Comment: Glucose reference range applies only to samples taken after fasting for at least 8 hours.  Glucose, capillary     Status: Abnormal   Collection Time: 05/09/23  4:30 PM  Result Value Ref Range   Glucose-Capillary 151 (H) 70 - 99 mg/dL    Comment: Glucose reference range applies only to samples taken after fasting for at least 8 hours.  Glucose, capillary     Status: Abnormal   Collection Time: 05/09/23  9:35 PM  Result Value Ref Range   Glucose-Capillary 151 (H) 70 - 99 mg/dL    Comment: Glucose reference range applies only to samples taken after fasting for at least 8 hours.  Hemoglobin     Status: Abnormal   Collection Time: 05/10/23  3:07 AM  Result Value Ref Range   Hemoglobin 9.9 (L) 13.0 - 17.0 g/dL    Comment: Performed at [facility], [address] [place], [place] [address]  Potassium     Status: None   Collection Time: 05/10/23  3:07 AM  Result Value Ref Range   Potassium 3.6 3.5 - 5.1 mmol/L    Comment: Performed at [facility], [address] [place], [place] [address]  Creatinine, serum     Status: None   Collection Time: 05/10/23  3:07 AM  Result Value Ref Range   Creatinine, Ser 0.91 0.61 - 1.24 mg/dL   GFR, Estimated >75 >64 mL/min    Comment: (NOTE) Calculated using the CKD-EPI Creatinine Equation (2021) Performed at [facility],  [address], [place], [place] [address]     Imaging / Studies: No results found.  Medications / Allergies: per chart  Antibiotics: Anti-infectives (From admission, onward)    Start     Dose/Rate Route Frequency Ordered Stop   05/06/23 2000  cefoTEtan (CEFOTAN) 2 g in sodium chloride 0.9 % 100 mL IVPB        2 g 200 mL/hr over 30 Minutes Intravenous Every 12 hours 05/06/23 1308 05/06/23 2014   05/06/23 1400  neomycin (MYCIFRADIN) tablet 1,000 mg  Status:  Discontinued       See Hyperspace for full Linked Orders Report.   1,000 mg Oral 3 times per day 05/06/23 0540 05/06/23 0545   05/06/23 1400  metroNIDAZOLE (FLAGYL) tablet 1,000 mg  Status:  Discontinued       See Hyperspace for full Linked Orders Report.   1,000 mg Oral 3 times per day 05/06/23 0540 05/06/23 0545   05/06/23 0600  cefoTEtan (CEFOTAN) 2 g in sodium chloride 0.9 % 100 mL IVPB        2 g 200 mL/hr over 30 Minutes Intravenous On call to O.R. 05/06/23 0540 05/06/23 0817         Note: Portions of this report may have been transcribed using voice recognition software. Every effort was made to ensure accuracy; however, inadvertent computerized transcription errors may be present.   Any transcriptional errors that result from this process are unintentional.    Ardeth Sportsman, MD, FACS, MASCRS Esophageal, Gastrointestinal & Colorectal Surgery Robotic and Minimally Invasive Surgery  [facility] [address] [place], [place] [address] [phone] Fax [phone] Main  CONTACT INFORMATION:  Weekday (9AM-5PM): Call [facility] main office at [phone]  Weeknight (5PM-9AM) or Weekend/Holiday: Check [email or web address] (password " [email or web address]") for General Surgery [facility] coverage  (Please, do not use SecureChat as it is not reliable communication to reach operating surgeons for immediate patient care given surgeries/outpatient duties/clinic/cross-coverage/off  post-call which would lead to a delay in care.  Epic staff messaging available for outptient concerns, but may not be answered for 48 hours or more).     05/10/2023  7:16 AM

## 2023-05-10 NOTE — Care Management Important Message (Signed)
Important Message  Patient Details IM Letter given. Name: [name] [name] MRN: [id number] Date of Birth: 06/03/1946   Medicare Important Message Given:  Yes     Caren Macadam 05/10/2023, 1:29 PM

## 2023-05-10 NOTE — Progress Notes (Signed)
Mobility Specialist - Progress Note   05/10/23 0938  Mobility  Activity Ambulated independently in hallway;Ambulated independently to bathroom  Level of Assistance Modified independent, requires aide device or extra time  Assistive Device Other (Comment) (IV Pole)  Distance Ambulated (ft) 500 ft  Range of Motion/Exercises Active  Activity Response Tolerated well  Mobility Referral Yes  $Mobility charge 1 Mobility  Mobility Specialist Start Time (ACUTE ONLY) [id number]  Mobility Specialist Stop Time (ACUTE ONLY) [id number]  Mobility Specialist Time Calculation (min) (ACUTE ONLY) 11 min   Pt was found in bed and agreeable to ambulate. Had no complaints and at EOS returned to bed with all needs met. Call bell in reach and bed alarm on.  Billey Chang Mobility Specialist

## 2023-05-11 LAB — GLUCOSE, CAPILLARY: Glucose-Capillary: 141 mg/dL — ABNORMAL HIGH (ref 70–99)

## 2023-05-11 MED ORDER — HEPARIN SOD (PORK) LOCK FLUSH 100 UNIT/ML IV SOLN
500.0000 [IU] | INTRAVENOUS | Status: AC | PRN
  Administered 2023-05-11: 500 [IU]

## 2023-05-11 MED ORDER — DIPHENOXYLATE-ATROPINE 2.5-0.025 MG PO TABS: 1.0000 | ORAL_TABLET | Freq: Two times a day (BID) | ORAL | 5 refills | Status: AC

## 2023-05-11 NOTE — Discharge Summary (Addendum)
Physician Discharge Summary    Patient ID: [name] [name] MRN: [id number] DOB/AGE: [birth year]  77 y.o.  Patient Care Team: Miki Kins, FNP as PCP - General (Family Medicine) Mariah Milling, Tollie Pizza, MD as PCP - Cardiology (Cardiology) Antonieta Iba, MD as Consulting Physician (Cardiology) Benita Gutter, RN as Oncology Nurse Navigator Jeralyn Ruths, MD as Consulting Physician (Oncology) [name], MD as Consulting Physician (Colon and Rectal Surgery)  Admit date: 05/06/2023  Discharge date: 05/11/2023  Hospital Stay = 5 days    Discharge Diagnoses:  Principal Problem:   Rectal adenocarcinoma [facility]) Active Problems:   Hypertension associated with diabetes (HCC)   Coronary artery disease   Type 2 diabetes mellitus with diabetic polyneuropathy, without long-term current use of insulin (HCC)   Ileostomy in place (HCC)   5 Days Post-Op  05/06/2023  POST-OPERATIVE DIAGNOSIS:   RECTAL CANCER COLON OBSTRUCTION DUE TO RECTAL CANCER S/P DIVERTING LOOP COLOSTOMY   PROCEDURE:   LOW ANTERIOR RECTOSIGMOID RESECTION TAKEDOWN OF LOOP COLOSTOMY WITH ANASTOMOSIS MOBILIZATION OF SPLENIC FLEXURE OF COLON DIVERTING LOOP ILEOSTOMY INTRAOPERATIVE ASSESSMENT OF TISSUE VASCULAR PERFUSION USING ICG (indocyanine green) IMMUNOFLUORESCENCE FLEXIBLE SIGMOIDOSCOPY   SURGEON:  Ardeth Sportsman, MD   OR FINDINGS:    Patient had thickened stellate scarring at peritoneal fraction at the junction tween the proximal and mid rectum.  Seem to have a decent treatment effect.  No obvious carcinomatosis.  No obvious metastatic disease on visceral parietal peritoneum or liver.  Need for loop colostomy takedown and splenic flexure mobilization to get reach.   It is a 29mm EEA anastomosis ( distal descending colon  connected to  mid/distal rectal junction .)  It rests 7 cm from the anal verge by rigid proctoscopy.   Protective diverting loop ileostomy created.  Positioned at old left  upper quadrant colostomy site with some fascial and skin closure/revision.  Proximal end is cephalad.   ##################################################################  Preoperative diagnosis:  Rectal mass Postoperative diagnosis:  Same   Procedure: Cystoscopy Instillation of ureteral firefly constrast   Surgeon: Crist Fat, MD   Intraoperative findings: large capacity bladder, orthotopic ureteral orifice ease, no significant other abnormality.  ##################################################################   PATHOLOGY   FINAL MICROSCOPIC DIAGNOSIS:  A. COLOSTOMY: Colostomy. Negative for malignancy.  B. RECTOSIGMOID, RESECTION: Invasive adenocarcinoma, moderately differentiated associated with extensive fibrosis. Carcinoma extends into perirectal connective tissue. All surgical margins negative for carcinoma. Ten lymph nodes negative for metastatic carcinoma (0/10). Pathologic Stage Classification (pTNM, AJCC 8th Edition): ypT3, ypN0  C. FINAL DISTAL MARGIN: Benign colon. Distal margin negative for carcinoma.    Consults: Pharmacy, Nutrition, Wound ostomy consult nurse (WOCN), and Anesthesia  Hospital Course:   The patient underwent the surgeries above.  Postoperatively, the patient gradually mobilized and advanced to a solid diet.  Pain and other symptoms were treated aggressively.   Patient had return of bowel function and was advanced to a solid diet.  Output controlled with soluble fiber, iron, antidiarrheals.  Wound ostomy nursing consultation made.  Felt no need to radically change the pouches.  Pathology came back consistent with ypT3ypN0 downgraded rectal cancer with negative margins.  Discussed with patient and copy of pathology report in room.  By the time of discharge, the patient was walking well the hallways, eating food, having flatus.  Pain was well-controlled on an oral medications.  His surgical pelvic drain output had tapered off.  Had  a little bit of yellow drainage yesterday but is clearly serosanguineous.  Therefore drain removed on  day of discharge.  Based on meeting discharge criteria and continuing to recover, I felt it was safe for the patient to be discharged from the hospital to further recover with close followup. Postoperative recommendations were discussed in detail.  They are written as well.  Discharged Condition: good  Discharge Exam: Blood pressure (!) 140/65, pulse (!) 59, temperature 98 F (36.7 C), temperature source Oral, resp. rate 18, height 6' (1.829 m), weight 80.4 kg, SpO2 99 %.  General: Pt awake/alert/oriented x4 in No acute distress Eyes: PERRL, normal EOM.  Sclera clear.  No icterus Neuro: CN II-XII intact w/o focal sensory/motor deficits. Lymph: No head/neck/groin lymphadenopathy Psych:  No delerium/psychosis/paranoia HENT: Normocephalic, Mucus membranes moist.  No thrush Neck: Supple, No tracheal deviation Chest:  No chest wall pain w good excursion CV:  Pulses intact.  Regular rhythm MS: Normal AROM mjr joints.  No obvious deformity Abdomen: Soft.  Nondistended.  Nontender.  No evidence of peritonitis.  No incarcerated hernias. Ext:  SCDs BLE.  No mjr edema.  No cyanosis Skin: No petechiae / purpura   Disposition:    Follow-up Information     [name], MD Follow up.   Specialties: General Surgery, Colon and Rectal Surgery Contact information: [address] [place] [address] [phone]                 Discharge disposition: 01-Home or Self Care       Discharge Instructions     Amb Referral to Ostomy Clinic   Complete by: As directed    Patient switch from prolapsing transverse colostomy to new diverting loop ileostomy.  May need help with adjustments for pouching, etc.   Reason for referral modifiers: Pre and post-operative counseling for ostomy management   Call MD for:   Complete by: As directed    FEVER > 101.5 F (Temperatures  <101.59F can occasionally happen and are not significant)   Call MD for:  extreme fatigue   Complete by: As directed    Call MD for:  persistant dizziness or light-headedness   Complete by: As directed    Call MD for:  persistant nausea and vomiting   Complete by: As directed    Call MD for:  redness, tenderness, or signs of infection (pain, swelling, redness, odor or green/yellow discharge around incision site)   Complete by: As directed    Call MD for:  severe uncontrolled pain   Complete by: As directed    Diet - low sodium heart healthy   Complete by: As directed    Follow a light diet the first few days at home.    If you feel full, bloated, or constipated, stay on a liquid diet until you feel better and not constipated. Gradually get back to a solid diet.  Avoid fast food or heavy meals the first week as you are more likely to get nauseated. It is expected for your digestive tract to need a few months to get back to normal.   Discharge wound care:   Complete by: As directed    Sherrill reports that dressings.  Ileostomy care: See instructions.   Driving Restrictions   Complete by: As directed    You may drive when you are no longer taking narcotic prescription pain medication, you can comfortably wear a seatbelt, and you can safely make sudden turns/stops to protect yourself without hesitating due to pain.   Increase activity slowly   Complete by: As directed  Lifting restrictions   Complete by: As directed    Start light daily activities --- self-care, walking, climbing stairs- beginning the day after surgery.   Gradually increase activities as tolerated.   Control your pain to be active.   Stop when you are tired.   Ideally, walk several times a day, eventually an hour a day.   Most people are back to most day-to-day activities in a few weeks.  It takes 4-8 weeks to get back to unrestricted, intense activity. If you can walk 30 minutes without difficulty, it is safe to try  more intense activity such as jogging, treadmill, bicycling, low-impact aerobics, swimming, etc. Save the most intensive and strenuous activity for last (Usually 4-8 weeks after surgery) such as sit-ups, heavy lifting, contact sports, etc.   Refrain from any intense heavy lifting or straining until you are off narcotics for pain control.  You will have off days, but things should improve week-by-week. DO NOT PUSH THROUGH PAIN.   Let pain be your guide: If it hurts to do something, don't do it.  Pain is your body warning you to avoid that activity for another week until the pain goes down.   May shower / Bathe   Complete by: As directed    May walk up steps   Complete by: As directed    Sexual Activity Restrictions   Complete by: As directed    You may have sexual intercourse when it is comfortable. If it hurts to do something, stop.       Allergies as of 05/11/2023   No Known Allergies      Medication List     STOP taking these medications    HYDROcodone-acetaminophen 5-325 MG tablet Commonly known as: NORCO/VICODIN   isosorbide mononitrate 30 MG 24 hr tablet Commonly known as: IMDUR       TAKE these medications    aspirin EC 81 MG tablet Take 81 mg by mouth daily.   atorvastatin 40 MG tablet Commonly known as: LIPITOR Take 1 tablet (40 mg total) by mouth daily.   diphenoxylate-atropine 2.5-0.025 MG tablet Commonly known as: LOMOTIL Take 1 tablet by mouth 2 (two) times daily. What changed:  when to take this reasons to take this   famotidine 40 MG tablet Commonly known as: PEPCID Take 1 tablet (40 mg total) by mouth daily.   glimepiride 4 MG tablet Commonly known as: AMARYL Take 4 mg by mouth daily with breakfast.   lidocaine-prilocaine cream Commonly known as: EMLA Apply to affected area once   metFORMIN 1000 MG tablet Commonly known as: GLUCOPHAGE Take 1,000 mg by mouth 2 (two) times daily with a meal.   nitroGLYCERIN 0.4 MG SL tablet Commonly  known as: NITROSTAT Place 1 tablet (0.4 mg total) under the tongue every 5 (five) minutes as needed for chest pain.   pioglitazone 15 MG tablet Commonly known as: ACTOS Take 15 mg by mouth daily.   traMADol 50 MG tablet Commonly known as: ULTRAM Take 1-2 tablets (50-100 mg total) by mouth every 6 (six) hours as needed for moderate pain.               Discharge Care Instructions  (From admission, onward)           Start     Ordered   05/07/23 0000  Discharge wound care:       Comments: Truett Perna reports that dressings.  Ileostomy care: See instructions.   05/07/23 4098  Significant Diagnostic Studies:  Results for orders placed or performed during the hospital encounter of 05/06/23 (from the past 72 hour(s))  Troponin I (High Sensitivity)     Status: None   Collection Time: 05/08/23 11:30 AM  Result Value Ref Range   Troponin I (High Sensitivity) 7 <18 ng/L    Comment: (NOTE) Elevated high sensitivity troponin I (hsTnI) values and significant  changes across serial measurements may suggest ACS but many other  chronic and acute conditions are known to elevate hsTnI results.  Refer to the "Links" section for chest pain algorithms and additional  guidance. Performed at [facility], [address], [place], [place] [address]   Glucose, capillary     Status: Abnormal   Collection Time: 05/08/23 11:32 AM  Result Value Ref Range   Glucose-Capillary 156 (H) 70 - 99 mg/dL    Comment: Glucose reference range applies only to samples taken after fasting for at least 8 hours.  Troponin I (High Sensitivity)     Status: None   Collection Time: 05/08/23  1:20 PM  Result Value Ref Range   Troponin I (High Sensitivity) 6 <18 ng/L    Comment: (NOTE) Elevated high sensitivity troponin I (hsTnI) values and significant  changes across serial measurements may suggest ACS but many other  chronic and acute conditions are known to elevate hsTnI  results.  Refer to the "Links" section for chest pain algorithms and additional  guidance. Performed at [facility], [address] [place], [place] [address]   Glucose, capillary     Status: Abnormal   Collection Time: 05/08/23  4:11 PM  Result Value Ref Range   Glucose-Capillary 153 (H) 70 - 99 mg/dL    Comment: Glucose reference range applies only to samples taken after fasting for at least 8 hours.  Glucose, capillary     Status: Abnormal   Collection Time: 05/08/23  9:19 PM  Result Value Ref Range   Glucose-Capillary 160 (H) 70 - 99 mg/dL    Comment: Glucose reference range applies only to samples taken after fasting for at least 8 hours.  Hemoglobin     Status: Abnormal   Collection Time: 05/09/23  2:52 AM  Result Value Ref Range   Hemoglobin 11.1 (L) 13.0 - 17.0 g/dL    Comment: Performed at [facility], [address], [place], [place] [address]  Potassium     Status: None   Collection Time: 05/09/23  2:52 AM  Result Value Ref Range   Potassium 3.8 3.5 - 5.1 mmol/L    Comment: Performed at [facility], [address] [place], [place] [address]  Creatinine, serum     Status: None   Collection Time: 05/09/23  2:52 AM  Result Value Ref Range   Creatinine, Ser 0.86 0.61 - 1.24 mg/dL   GFR, Estimated >65 >78 mL/min    Comment: (NOTE) Calculated using the CKD-EPI Creatinine Equation (2021) Performed at [facility], [address] [place], [place] [address]   Glucose, capillary     Status: Abnormal   Collection Time: 05/09/23  7:28 AM  Result Value Ref Range   Glucose-Capillary 220 (H) 70 - 99 mg/dL    Comment: Glucose reference range applies only to samples taken after fasting for at least 8 hours.  Glucose, capillary     Status: Abnormal   Collection Time: 05/09/23 12:20 PM  Result Value Ref Range   Glucose-Capillary 255 (H) 70 - 99  mg/dL    Comment: Glucose reference range applies only  to samples taken after fasting for at least 8 hours.  Glucose, capillary     Status: Abnormal   Collection Time: 05/09/23  4:30 PM  Result Value Ref Range   Glucose-Capillary 151 (H) 70 - 99 mg/dL    Comment: Glucose reference range applies only to samples taken after fasting for at least 8 hours.  Glucose, capillary     Status: Abnormal   Collection Time: 05/09/23  9:35 PM  Result Value Ref Range   Glucose-Capillary 151 (H) 70 - 99 mg/dL    Comment: Glucose reference range applies only to samples taken after fasting for at least 8 hours.  Hemoglobin     Status: Abnormal   Collection Time: 05/10/23  3:07 AM  Result Value Ref Range   Hemoglobin 9.9 (L) 13.0 - 17.0 g/dL    Comment: Performed at [facility], [address], [place], [place] [address]  Potassium     Status: None   Collection Time: 05/10/23  3:07 AM  Result Value Ref Range   Potassium 3.6 3.5 - 5.1 mmol/L    Comment: Performed at [facility], [address], [place], [place] [address]  Creatinine, serum     Status: None   Collection Time: 05/10/23  3:07 AM  Result Value Ref Range   Creatinine, Ser 0.91 0.61 - 1.24 mg/dL   GFR, Estimated >13 >08 mL/min    Comment: (NOTE) Calculated using the CKD-EPI Creatinine Equation (2021) Performed at [facility], [address], [place], [place] [address]   Glucose, capillary     Status: Abnormal   Collection Time: 05/10/23  7:18 AM  Result Value Ref Range   Glucose-Capillary 164 (H) 70 - 99 mg/dL    Comment: Glucose reference range applies only to samples taken after fasting for at least 8 hours.  Glucose, capillary     Status: Abnormal   Collection Time: 05/10/23 11:36 AM  Result Value Ref Range   Glucose-Capillary 237 (H) 70 - 99 mg/dL    Comment: Glucose reference range applies only to samples taken after fasting for at least 8 hours.  Glucose, capillary     Status: Abnormal   Collection Time: 05/10/23  4:51 PM   Result Value Ref Range   Glucose-Capillary 149 (H) 70 - 99 mg/dL    Comment: Glucose reference range applies only to samples taken after fasting for at least 8 hours.  Glucose, capillary     Status: Abnormal   Collection Time: 05/10/23  8:54 PM  Result Value Ref Range   Glucose-Capillary 206 (H) 70 - 99 mg/dL    Comment: Glucose reference range applies only to samples taken after fasting for at least 8 hours.  Glucose, capillary     Status: Abnormal   Collection Time: 05/11/23  7:21 AM  Result Value Ref Range   Glucose-Capillary 141 (H) 70 - 99 mg/dL    Comment: Glucose reference range applies only to samples taken after fasting for at least 8 hours.    No results found.  Past Medical History:  Diagnosis Date   Anemia    Anginal pain (HCC)    Bradycardia    Cancer (HCC)    Coronary artery disease    x 1 stent   Depression    Diabetes mellitus without complication (HCC)    GERD (gastroesophageal reflux disease)    History of kidney stones    Hyperlipidemia  Hypertension    Nephrolithiasis    Partial Large bowel obstruction (HCC)     Past Surgical History:  Procedure Laterality Date   CARDIAC CATHETERIZATION     CATARACT EXTRACTION     COLONOSCOPY WITH PROPOFOL N/A 05/05/2023   Procedure: COLONOSCOPY WITH PROPOFOL;  Surgeon: Regis Bill, MD;  Location: [facility] ENDOSCOPY;  Service: Endoscopy;  Laterality: N/A;  Patient having surgery tomorrow, 05/06/2023. Ok'd per [place] and office   CORONARY ANGIOPLASTY     EYE SURGERY     FLEXIBLE SIGMOIDOSCOPY N/A 07/27/2022   Procedure: FLEXIBLE SIGMOIDOSCOPY;  Surgeon: Regis Bill, MD;  Location: [facility] ENDOSCOPY;  Service: Endoscopy;  Laterality: N/A;   KIDNEY STONE SURGERY     NECK SURGERY  12/18/2011   nephrolithiasis     PORTACATH PLACEMENT Right 08/24/2022   Procedure: INSERTION PORT-A-CATH;  Surgeon: Campbell Lerner, MD;  Location: [facility] ORS;  Service: General;  Laterality: Right;   stent (other)     TRANSVERSE  LOOP COLOSTOMY N/A 07/28/2022   Procedure: TRANSVERSE LOOP COLOSTOMY;  Surgeon: Campbell Lerner, MD;  Location: [facility] ORS;  Service: General;  Laterality: N/A;   XI ROBOTIC ASSISTED LOWER ANTERIOR RESECTION N/A 05/06/2023   Procedure: XI ROBOTIC ASSISTED LOWER ANTERIOR RECTOSIGMOID RESECTION, TAKEDOWN OF OLD LOOP COLOSTOMY WITH ANASTOMOSIS, MOBILIZATION OF SPLENIC FLEXURE OF COLON, AND NEW DIVERTING LOOP ILEOSTOMY, INTRAOPERATIVE ASSESSMENT OF TISSUE VASCULAR PERFUSION USING ICG (INDOCYANINE GREEN) IMMUNOFLUORESCENCE, FLEXIBLE SIGMOIDOSCOPY;  Surgeon: [name], MD;  Location: [facility] ORS;  Service: General;  Lateralit    Social History   Socioeconomic History   Marital status: Married    Spouse name: Doris   Number of children: Not on file   Years of education: Not on file   Highest education level: Not on file  Occupational History   Not on file  Tobacco Use   Smoking status: Every Day    Packs/day: 0.25    Years: 36.00    Additional pack years: 0.00    Total pack years: 9.00    Types: Cigarettes   Smokeless tobacco: Never   Tobacco comments:    Smokes 6 cigarettes daily   Vaping Use   Vaping Use: Never used  Substance and Sexual Activity   Alcohol use: No   Drug use: No   Sexual activity: Not on file  Other Topics Concern   Not on file  Social History Narrative   Not on file   Social Determinants of Health   Financial Resource Strain: Not on file  Food Insecurity: No Food Insecurity (05/06/2023)   Hunger Vital Sign    Worried About Running Out of Food in the Last Year: Never true    Ran Out of Food in the Last Year: Never true  Transportation Needs: No Transportation Needs (05/06/2023)   PRAPARE - Administrator, Civil Service (Medical): No    Lack of Transportation (Non-Medical): No  Physical Activity: Not on file  Stress: Not on file  Social Connections: Not on file  Intimate Partner Violence: Not At Risk (05/06/2023)   Humiliation, Afraid, Rape, and  Kick questionnaire    Fear of Current or Ex-Partner: No    Emotionally Abused: No    Physically Abused: No    Sexually Abused: No    Family History  Problem Relation Age of Onset   Heart disease Father     Current Facility-Administered Medications  Medication Dose Route Frequency Provider Last Rate Last Admin   0.9 %  sodium chloride infusion  250 mL Intravenous PRN [name], MD       acetaminophen (TYLENOL) tablet 1,000 mg  1,000 mg Oral Trecia Rogers, MD   1,000 mg at 05/11/23 0519   alum & mag hydroxide-simeth (MAALOX/MYLANTA) 200-200-20 MG/5ML suspension 30 mL  30 mL Oral Q6H PRN [name], MD       aspirin EC tablet 81 mg  81 mg Oral Daily [name], MD   81 mg at 05/10/23 1104   atorvastatin (LIPITOR) tablet 40 mg  40 mg Oral Daily [name], MD   40 mg at 05/10/23 1104   Chlorhexidine Gluconate Cloth 2 % PADS 6 each  6 each Topical Daily [name], MD   6 each at 05/09/23 1032   diphenhydrAMINE (BENADRYL) 12.5 MG/5ML elixir 12.5 mg  12.5 mg Oral Q6H PRN [name], MD       Or   diphenhydrAMINE (BENADRYL) injection 12.5 mg  12.5 mg Intravenous Q6H PRN [name], MD       diphenoxylate-atropine (LOMOTIL) 2.5-0.025 MG per tablet 1 tablet  1 tablet Oral BID [name], MD   1 tablet at 05/10/23 2111   enalaprilat (VASOTEC) injection 0.625-1.25 mg  0.625-1.25 mg Intravenous Q6H PRN [name], MD       enoxaparin (LOVENOX) injection 40 mg  40 mg Subcutaneous Q24H [name], MD   40 mg at 05/11/23 0749   feeding supplement (ENSURE SURGERY) liquid 237 mL  237 mL Oral BID BM [name], MD   237 mL at 05/10/23 1109   ferrous sulfate tablet 325 mg  325 mg Oral BID WC [name], MD   325 mg at 05/11/23 0747   glimepiride (AMARYL) tablet 4 mg  4 mg Oral Q breakfast [name], MD   4 mg at 05/11/23 2952   Or   glimepiride (AMARYL) tablet 2 mg  2 mg Oral Q breakfast [name], MD       hydrALAZINE (APRESOLINE) injection 10 mg  10 mg  Intravenous Q2H PRN [name], MD       HYDROmorphone (DILAUDID) injection 0.5-2 mg  0.5-2 mg Intravenous Q4H PRN [name], MD   0.5 mg at 05/08/23 1150   insulin aspart (novoLOG) injection 0-15 Units  0-15 Units Subcutaneous TID WC [name], MD   2 Units at 05/11/23 0749   insulin aspart (novoLOG) injection 0-5 Units  0-5 Units Subcutaneous Laurena Slimmer, MD   2 Units at 05/10/23 2111   lactated ringers bolus 1,000 mL  1,000 mL Intravenous Q8H PRN [name], MD       lidocaine-prilocaine (EMLA) cream   Topical PRN [name], MD       loperamide (IMODIUM) capsule 2-4 mg  2-4 mg Oral Q6H PRN [name], MD       magic mouthwash  15 mL Oral QID PRN [name], MD       melatonin tablet 3 mg  3 mg Oral QHS PRN [name], MD       menthol-cetylpyridinium (CEPACOL) lozenge 3 mg  1 lozenge Oral PRN [name], MD       methocarbamol (ROBAXIN) 1,000 mg in dextrose 5 % 100 mL IVPB  1,000 mg Intravenous Q6H PRN [name], MD 220 mL/hr at 05/09/23 2150 1,000 mg at 05/09/23 2150   methocarbamol (ROBAXIN) tablet 1,000 mg  1,000 mg Oral Q6H PRN [name], MD   1,000 mg at 05/10/23 2111   metoprolol tartrate (LOPRESSOR) injection 5 mg  5  mg Intravenous Q6H PRN [name], MD       nitroGLYCERIN (NITROSTAT) SL tablet 0.4 mg  0.4 mg Sublingual Q5 min PRN [name], MD       ondansetron [facility]) tablet 4 mg  4 mg Oral Q6H PRN [name], MD       Or   ondansetron [facility]) injection 4 mg  4 mg Intravenous Q6H PRN [name], MD   4 mg at 05/09/23 0825   phenol (CHLORASEPTIC) mouth spray 2 spray  2 spray Mouth/Throat PRN [name], MD       pioglitazone (ACTOS) tablet 15 mg  15 mg Oral Daily [name], MD   15 mg at 05/10/23 1216   polycarbophil (FIBERCON) tablet 625 mg  625 mg Oral BID [name], MD   625 mg at 05/10/23 2111   prochlorperazine (COMPAZINE) tablet 10 mg  10 mg Oral Q6H PRN [name], MD       Or   prochlorperazine  (COMPAZINE) injection 5-10 mg  5-10 mg Intravenous Q6H PRN [name], MD       simethicone (MYLICON) chewable tablet 40 mg  40 mg Oral Q6H PRN [name], MD   40 mg at 05/07/23 1742   sodium chloride flush (NS) 0.9 % injection 10-40 mL  10-40 mL Intracatheter Catha Gosselin, MD   10 mL at 05/09/23 2150   sodium chloride flush (NS) 0.9 % injection 10-40 mL  10-40 mL Intracatheter PRN [name], MD       sodium chloride flush (NS) 0.9 % injection 3 mL  3 mL Intravenous Catha Gosselin, MD       sodium chloride flush (NS) 0.9 % injection 3 mL  3 mL Intravenous PRN [name], MD       traMADol Janean Sark) tablet 50-100 mg  50-100 mg Oral Q6H PRN [name], MD   100 mg at 05/10/23 2111     No Known Allergies  Signed:   Ardeth Sportsman, MD, FACS, MASCRS Esophageal, Gastrointestinal & Colorectal Surgery Robotic and Minimally Invasive Surgery  [facility] [address], [address] [place], [place] [address] [phone] Fax [phone] Main  CONTACT INFORMATION:  Weekday (9AM-5PM): Call [facility] main office at [phone]  Weeknight (5PM-9AM) or Weekend/Holiday: Check [email or web address] (password " TRH1") for General Surgery [facility] coverage  (Please, do not use SecureChat as it is not reliable communication to reach operating surgeons for immediate patient care given surgeries/outpatient duties/clinic/cross-coverage/off post-call which would lead to a delay in care.  Epic staff messaging available for outptient concerns, but may not be answered for 48 hours or more).     05/11/2023, 8:01 AM

## 2023-05-11 NOTE — Progress Notes (Signed)
Reviewed written discharge instructions with patient. All questions answered. Patient verbalized understanding. Discharged via wheelchair with all belongings... in stable condition. 

## 2023-05-11 NOTE — Plan of Care (Signed)
  Problem: Education: Goal: Ability to describe self-care measures that may prevent or decrease complications (Diabetes Survival Skills Education) will improve Outcome: Adequate for Discharge Goal: Individualized Educational Video(s) Outcome: Adequate for Discharge   Problem: Coping: Goal: Ability to adjust to condition or change in health will improve Outcome: Adequate for Discharge   Problem: Fluid Volume: Goal: Ability to maintain a balanced intake and output will improve Outcome: Adequate for Discharge   Problem: Health Behavior/Discharge Planning: Goal: Ability to identify and utilize available resources and services will improve Outcome: Adequate for Discharge Goal: Ability to manage health-related needs will improve Outcome: Adequate for Discharge   Problem: Metabolic: Goal: Ability to maintain appropriate glucose levels will improve Outcome: Adequate for Discharge   Problem: Nutritional: Goal: Maintenance of adequate nutrition will improve Outcome: Adequate for Discharge Goal: Progress toward achieving an optimal weight will improve Outcome: Adequate for Discharge   Problem: Skin Integrity: Goal: Risk for impaired skin integrity will decrease Outcome: Adequate for Discharge   Problem: Tissue Perfusion: Goal: Adequacy of tissue perfusion will improve Outcome: Adequate for Discharge   Problem: Education: Goal: Understanding of discharge needs will improve Outcome: Adequate for Discharge Goal: Verbalization of understanding of the causes of altered bowel function will improve Outcome: Adequate for Discharge   Problem: Activity: Goal: Ability to tolerate increased activity will improve Outcome: Adequate for Discharge   Problem: Bowel/Gastric: Goal: Gastrointestinal status for postoperative course will improve Outcome: Adequate for Discharge   Problem: Health Behavior/Discharge Planning: Goal: Identification of community resources to assist with postoperative  recovery needs will improve Outcome: Adequate for Discharge   Problem: Nutritional: Goal: Will attain and maintain optimal nutritional status will improve Outcome: Adequate for Discharge   Problem: Clinical Measurements: Goal: Postoperative complications will be avoided or minimized Outcome: Adequate for Discharge   Problem: Respiratory: Goal: Respiratory status will improve Outcome: Adequate for Discharge   Problem: Skin Integrity: Goal: Will show signs of wound healing Outcome: Adequate for Discharge   Problem: Education: Goal: Knowledge of General Education information will improve Description: Including pain rating scale, medication(s)/side effects and non-pharmacologic comfort measures Outcome: Adequate for Discharge   Problem: Health Behavior/Discharge Planning: Goal: Ability to manage health-related needs will improve Outcome: Adequate for Discharge   Problem: Clinical Measurements: Goal: Ability to maintain clinical measurements within normal limits will improve Outcome: Adequate for Discharge Goal: Will remain free from infection Outcome: Adequate for Discharge Goal: Diagnostic test results will improve Outcome: Adequate for Discharge Goal: Respiratory complications will improve Outcome: Adequate for Discharge Goal: Cardiovascular complication will be avoided Outcome: Adequate for Discharge   Problem: Activity: Goal: Risk for activity intolerance will decrease Outcome: Adequate for Discharge   Problem: Nutrition: Goal: Adequate nutrition will be maintained Outcome: Adequate for Discharge   Problem: Coping: Goal: Level of anxiety will decrease Outcome: Adequate for Discharge   Problem: Elimination: Goal: Will not experience complications related to bowel motility Outcome: Adequate for Discharge Goal: Will not experience complications related to urinary retention Outcome: Adequate for Discharge   Problem: Pain Managment: Goal: General experience of  comfort will improve Outcome: Adequate for Discharge   Problem: Safety: Goal: Ability to remain free from injury will improve Outcome: Adequate for Discharge   Problem: Skin Integrity: Goal: Risk for impaired skin integrity will decrease Outcome: Adequate for Discharge

## 2023-05-19 ENCOUNTER — Other Ambulatory Visit: Payer: Self-pay

## 2023-05-19 NOTE — Progress Notes (Signed)
The proposed treatment discussed in conference is for discussion purpose only and is not a binding recommendation.  The patients have not been physically examined, or presented with their treatment options.  Therefore, final treatment plans cannot be decided.  

## 2023-05-21 ENCOUNTER — Other Ambulatory Visit: Payer: Self-pay

## 2023-05-21 ENCOUNTER — Inpatient Hospital Stay
Admission: EM | Admit: 2023-05-21 | Discharge: 2023-05-23 | DRG: 640 | Disposition: A | Discharge status: Home / Self Care | Payer: Medicare HMO | Attending: Internal Medicine | Admitting: Internal Medicine

## 2023-05-21 ENCOUNTER — Emergency Department: Payer: Medicare HMO

## 2023-05-21 ENCOUNTER — Telehealth: Payer: Self-pay | Admitting: *Deleted

## 2023-05-21 ENCOUNTER — Ambulatory Visit: Payer: Medicare HMO | Admitting: Oncology

## 2023-05-21 DIAGNOSIS — Z9221 Personal history of antineoplastic chemotherapy: Secondary | ICD-10-CM

## 2023-05-21 DIAGNOSIS — F17210 Nicotine dependence, cigarettes, uncomplicated: Secondary | ICD-10-CM | POA: Diagnosis present

## 2023-05-21 DIAGNOSIS — Z85048 Personal history of other malignant neoplasm of rectum, rectosigmoid junction, and anus: Secondary | ICD-10-CM

## 2023-05-21 DIAGNOSIS — Z7984 Long term (current) use of oral hypoglycemic drugs: Secondary | ICD-10-CM

## 2023-05-21 DIAGNOSIS — Z79899 Other long term (current) drug therapy: Secondary | ICD-10-CM | POA: Diagnosis not present

## 2023-05-21 DIAGNOSIS — K429 Umbilical hernia without obstruction or gangrene: Secondary | ICD-10-CM | POA: Diagnosis not present

## 2023-05-21 DIAGNOSIS — E871 Hypo-osmolality and hyponatremia: Secondary | ICD-10-CM | POA: Diagnosis present

## 2023-05-21 DIAGNOSIS — R109 Unspecified abdominal pain: Secondary | ICD-10-CM | POA: Diagnosis present

## 2023-05-21 DIAGNOSIS — N179 Acute kidney failure, unspecified: Secondary | ICD-10-CM | POA: Diagnosis not present

## 2023-05-21 DIAGNOSIS — Z7982 Long term (current) use of aspirin: Secondary | ICD-10-CM | POA: Diagnosis not present

## 2023-05-21 DIAGNOSIS — Z932 Ileostomy status: Secondary | ICD-10-CM | POA: Diagnosis not present

## 2023-05-21 DIAGNOSIS — E1165 Type 2 diabetes mellitus with hyperglycemia: Secondary | ICD-10-CM | POA: Diagnosis present

## 2023-05-21 DIAGNOSIS — I9589 Other hypotension: Secondary | ICD-10-CM | POA: Diagnosis present

## 2023-05-21 DIAGNOSIS — I10 Essential (primary) hypertension: Secondary | ICD-10-CM | POA: Diagnosis not present

## 2023-05-21 DIAGNOSIS — Z8249 Family history of ischemic heart disease and other diseases of the circulatory system: Secondary | ICD-10-CM

## 2023-05-21 DIAGNOSIS — Z66 Do not resuscitate: Secondary | ICD-10-CM | POA: Diagnosis present

## 2023-05-21 DIAGNOSIS — N1832 Chronic kidney disease, stage 3b: Secondary | ICD-10-CM | POA: Diagnosis present

## 2023-05-21 DIAGNOSIS — F32A Depression, unspecified: Secondary | ICD-10-CM | POA: Diagnosis present

## 2023-05-21 DIAGNOSIS — I2510 Atherosclerotic heart disease of native coronary artery without angina pectoris: Secondary | ICD-10-CM | POA: Diagnosis present

## 2023-05-21 DIAGNOSIS — E785 Hyperlipidemia, unspecified: Secondary | ICD-10-CM | POA: Diagnosis present

## 2023-05-21 DIAGNOSIS — R112 Nausea with vomiting, unspecified: Secondary | ICD-10-CM | POA: Diagnosis present

## 2023-05-21 DIAGNOSIS — E1142 Type 2 diabetes mellitus with diabetic polyneuropathy: Secondary | ICD-10-CM | POA: Diagnosis present

## 2023-05-21 DIAGNOSIS — E861 Hypovolemia: Secondary | ICD-10-CM | POA: Diagnosis present

## 2023-05-21 DIAGNOSIS — N2889 Other specified disorders of kidney and ureter: Secondary | ICD-10-CM | POA: Diagnosis not present

## 2023-05-21 DIAGNOSIS — E860 Dehydration: Principal | ICD-10-CM | POA: Diagnosis present

## 2023-05-21 DIAGNOSIS — C20 Malignant neoplasm of rectum: Secondary | ICD-10-CM

## 2023-05-21 DIAGNOSIS — J189 Pneumonia, unspecified organism: Secondary | ICD-10-CM | POA: Diagnosis present

## 2023-05-21 DIAGNOSIS — K56609 Unspecified intestinal obstruction, unspecified as to partial versus complete obstruction: Secondary | ICD-10-CM | POA: Diagnosis not present

## 2023-05-21 HISTORY — DX: Chronic kidney disease, stage 3b: N18.32

## 2023-05-21 LAB — COMPREHENSIVE METABOLIC PANEL
ALT: 18 U/L (ref 0–44)
AST: 18 U/L (ref 15–41)
Albumin: 4.4 g/dL (ref 3.5–5.0)
Alkaline Phosphatase: 133 U/L — ABNORMAL HIGH (ref 38–126)
Anion gap: 16 — ABNORMAL HIGH (ref 5–15)
BUN: 98 mg/dL — ABNORMAL HIGH (ref 8–23)
CO2: 21 mmol/L — ABNORMAL LOW (ref 22–32)
Calcium: 9.8 mg/dL (ref 8.9–10.3)
Chloride: 91 mmol/L — ABNORMAL LOW (ref 98–111)
Creatinine, Ser: 2.43 mg/dL — ABNORMAL HIGH (ref 0.61–1.24)
GFR, Estimated: 27 mL/min — ABNORMAL LOW (ref >60)
Glucose, Bld: 272 mg/dL — ABNORMAL HIGH (ref 70–99)
Potassium: 4.9 mmol/L (ref 3.5–5.1)
Sodium: 128 mmol/L — ABNORMAL LOW (ref 135–145)
Total Bilirubin: 1 mg/dL (ref 0.3–1.2)
Total Protein: 8.3 g/dL — ABNORMAL HIGH (ref 6.5–8.1)

## 2023-05-21 LAB — CBC
HCT: 38.9 % — ABNORMAL LOW (ref 39.0–52.0)
Hemoglobin: 13.3 g/dL (ref 13.0–17.0)
MCH: 32.4 pg (ref 26.0–34.0)
MCHC: 34.2 g/dL (ref 30.0–36.0)
MCV: 94.6 fL (ref 80.0–100.0)
Platelets: 391 10*3/uL (ref 150–400)
RBC: 4.11 MIL/uL — ABNORMAL LOW (ref 4.22–5.81)
RDW: 13 % (ref 11.5–15.5)
WBC: 10.8 10*3/uL — ABNORMAL HIGH (ref 4.0–10.5)
nRBC: 0 % (ref 0.0–0.2)

## 2023-05-21 LAB — GLUCOSE, CAPILLARY
Glucose-Capillary: 175 mg/dL — ABNORMAL HIGH (ref 70–99)
Glucose-Capillary: 224 mg/dL — ABNORMAL HIGH (ref 70–99)

## 2023-05-21 LAB — LIPASE, BLOOD: Lipase: 130 U/L — ABNORMAL HIGH (ref 11–51)

## 2023-05-21 MED ORDER — ASPIRIN 81 MG PO TBEC
81.0000 mg | DELAYED_RELEASE_TABLET | Freq: Every day | ORAL | Status: DC
  Administered 2023-05-21 – 2023-05-23 (×3): 81 mg via ORAL
  Filled 2023-05-21 (×3): qty 1

## 2023-05-21 MED ORDER — TRAMADOL HCL 50 MG PO TABS
50.0000 mg | ORAL_TABLET | Freq: Four times a day (QID) | ORAL | Status: DC | PRN
  Administered 2023-05-22: 50 mg via ORAL
  Filled 2023-05-21: qty 1

## 2023-05-21 MED ORDER — SODIUM CHLORIDE 0.9 % IV SOLN: INTRAVENOUS | Status: DC

## 2023-05-21 MED ORDER — ATORVASTATIN CALCIUM 20 MG PO TABS
40.0000 mg | ORAL_TABLET | Freq: Every day | ORAL | Status: DC
  Administered 2023-05-21 – 2023-05-23 (×3): 40 mg via ORAL
  Filled 2023-05-21 (×3): qty 2

## 2023-05-21 MED ORDER — ONDANSETRON HCL 4 MG/2ML IJ SOLN: 4.0000 mg | Freq: Four times a day (QID) | INTRAMUSCULAR | Status: DC | PRN

## 2023-05-21 MED ORDER — SODIUM CHLORIDE 0.9 % IV BOLUS
500.0000 mL | Freq: Once | INTRAVENOUS | Status: AC
  Administered 2023-05-21: 500 mL via INTRAVENOUS

## 2023-05-21 MED ORDER — SODIUM CHLORIDE 0.9 % IV SOLN
1.0000 g | INTRAVENOUS | Status: DC
  Administered 2023-05-21 – 2023-05-22 (×2): 1 g via INTRAVENOUS
  Filled 2023-05-21 (×3): qty 10

## 2023-05-21 MED ORDER — INSULIN ASPART 100 UNIT/ML IJ SOLN
0.0000 [IU] | Freq: Three times a day (TID) | INTRAMUSCULAR | Status: DC
  Administered 2023-05-22: 2 [IU] via SUBCUTANEOUS
  Administered 2023-05-23: 1 [IU] via SUBCUTANEOUS
  Filled 2023-05-21 (×2): qty 1

## 2023-05-21 MED ORDER — SODIUM CHLORIDE 0.9 % IV SOLN
500.0000 mg | INTRAVENOUS | Status: DC
  Administered 2023-05-21: 500 mg via INTRAVENOUS
  Filled 2023-05-21 (×3): qty 5

## 2023-05-21 MED ORDER — ENOXAPARIN SODIUM 30 MG/0.3ML IJ SOSY
30.0000 mg | PREFILLED_SYRINGE | Freq: Every day | INTRAMUSCULAR | Status: DC
  Administered 2023-05-21: 30 mg via SUBCUTANEOUS
  Filled 2023-05-21: qty 0.3

## 2023-05-21 MED ORDER — FAMOTIDINE 20 MG PO TABS
40.0000 mg | ORAL_TABLET | Freq: Every day | ORAL | Status: DC
  Administered 2023-05-21 – 2023-05-23 (×3): 40 mg via ORAL
  Filled 2023-05-21 (×3): qty 2

## 2023-05-21 MED ORDER — INSULIN ASPART 100 UNIT/ML IJ SOLN
0.0000 [IU] | Freq: Every day | INTRAMUSCULAR | Status: DC
  Administered 2023-05-21: 2 [IU] via SUBCUTANEOUS
  Filled 2023-05-21: qty 1

## 2023-05-21 NOTE — ED Provider Notes (Signed)
[facility] Provider Note    Event Date/Time   First MD Initiated Contact with Patient 05/21/23 1403     (approximate)   History   Nausea   HPI  [name] [name] is a 77 y.o. male   recent discharge from the hospital on June 20 after treatment for rectal adenocarcinoma including colonic obstruction requiring diverting loop colostomy.  Patient reports that he since leaving the hospital has had nausea and inability to keep any food on his stomach.  He can have little bits of Jell-O and water.  He sees small amounts of stool output from his new ostomy.  He reports he is not in much pain it is more nausea and at the present time he does not feel nauseated but he feels dehydrated.  He and his family believe that he is dehydrated unable to eat or drink anything and feels like he is very dry  No chest pain or trouble breathing.  No fevers or chills.  Issue has been ongoing for over a week and a half now.    Patient does not currently wish for any pain medication or nausea medicine  Physical Exam   Triage Vital Signs: ED Triage Vitals  Enc Vitals Group     BP 05/21/23 1106 98/83     Pulse Rate 05/21/23 1106 95     Resp 05/21/23 1106 20     Temp 05/21/23 1106 97.6 F (36.4 C)     Temp src --      SpO2 05/21/23 1106 100 %     Weight 05/21/23 1107 170 lb (77.1 kg)     Height 05/21/23 1107 6' (1.829 m)     Head Circumference --      Peak Flow --      Pain Score 05/21/23 1107 5     Pain Loc --      Pain Edu? --      Excl. in GC? --     Most recent vital signs: Vitals:   05/21/23 1106 05/21/23 1418  BP: 98/83 110/83  Pulse: 95 76  Resp: 20 18  Temp: 97.6 F (36.4 C)   SpO2: 100% 98%     General: Awake, no distress.  He appears fatigued but in no distress Mucous membranes are quite dry, and his orbits appear slightly sunken CV:  Good peripheral perfusion.  Normal tones and rate Resp:  Normal effort.  Abd:  No distention.  Soft nontender  nondistended throughout.  He reports the whole of the stomach just feels little "sore" since the time of surgery but nothing sudden or acute. Other:     ED Results / Procedures / Treatments   Labs (all labs ordered are listed, but only abnormal results are displayed) Labs Reviewed  LIPASE, BLOOD - Abnormal; Notable for the following components:      Result Value   Lipase 130 (*)    All other components within normal limits  COMPREHENSIVE METABOLIC PANEL - Abnormal; Notable for the following components:   Sodium 128 (*)    Chloride 91 (*)    CO2 21 (*)    Glucose, Bld 272 (*)    BUN 98 (*)    Creatinine, Ser 2.43 (*)    Total Protein 8.3 (*)    Alkaline Phosphatase 133 (*)    GFR, Estimated 27 (*)    Anion gap 16 (*)    All other components within normal limits  CBC - Abnormal; Notable for the  following components:   WBC 10.8 (*)    RBC 4.11 (*)    HCT 38.9 (*)    All other components within normal limits     EKG     RADIOLOGY  CT ABDOMEN PELVIS WO CONTRAST  Result Date: 05/21/2023 CLINICAL DATA:  Bowel obstruction suspected no contrast, AKI Recent ileostomy surgery 6/20.  Soreness at the surgical site. EXAM: CT ABDOMEN AND PELVIS WITHOUT CONTRAST TECHNIQUE: Multidetector CT imaging of the abdomen and pelvis was performed following the standard protocol without IV contrast. RADIATION DOSE REDUCTION: This exam was performed according to the departmental dose-optimization program which includes automated exposure control, adjustment of the mA and/or kV according to patient size and/or use of iterative reconstruction technique. COMPARISON:  Most recent comparison contrast-enhanced exam 07/26/2022 FINDINGS: Lower chest: Fine tree-in-bud opacities in the dependent right lower lobe. No pleural fluid. Normal heart size with coronary artery calcification. Hepatobiliary: No focal hepatic abnormality on this unenhanced exam. Gallbladder physiologically distended, no calcified stone. No  biliary dilatation. Pancreas: No ductal dilatation or inflammation. Spleen: Normal in size without focal abnormality. Adrenals/Urinary Tract: No adrenal nodule. Cortical scarring in the mid right kidney. Calcifications of both renal hila may represent vascular calcifications or nonobstructing intrarenal stones. Cystic changes at the renal hila correspond to parapelvic cysts on prior CT, needing no further imaging follow-up. No ureteral dilatation or frank hydronephrosis. No evidence of ureteral stone. Unremarkable appearance of the urinary bladder. Stomach/Bowel: The detailed bowel assessment is limited in the absence of enteric contrast. Presumed loop ileostomy in the upper abdomen. Minimal edema in the small bowel mesentery is not unexpected given recent surgery. No peristomal hernia, focal fluid collection or evident complication. Enteric sutures in the transverse colon. Chain sutures at the rectosigmoid junction. No small bowel distension or evidence of obstruction. There may be some adhesions involving small bowel in the mid pelvis with slight tethering. The stomach is nondistended. The appendix is not definitively seen. Vascular/Lymphatic: Advanced aortic atherosclerosis. No definite enlarged lymph nodes in the abdomen or pelvis. Reproductive: Prostate is unremarkable. Other: Mild presacral soft tissue thickening is nonspecific. No ascites, free air or focal fluid collection. Tiny fat containing umbilical hernia. Musculoskeletal: Lumbar degenerative disc disease and facet hypertrophy. Bilateral hip osteoarthritis. There are no acute or suspicious osseous abnormalities. IMPRESSION: 1. Ileostomy in the upper abdomen. Minimal edema in the small bowel mesentery is not unexpected given recent surgery. No peristomal hernia, focal fluid collection or evident complication. 2. There may be some adhesions involving small bowel in the mid pelvis with slight tethering. No bowel distension or evidence of obstruction. 3.  Fine tree-in-bud opacities in the dependent right lower lobe, likely infectious or inflammatory. 4. Bilateral renal calcifications may represent vascular calcifications or nonobstructing intrarenal stones. No hydronephrosis. Aortic Atherosclerosis (ICD10-I70.0). Electronically Signed   By: Narda Rutherford M.D.   On: 05/21/2023 15:08    Of note, patient does not have findings on CT suggestive of acute intra-abdominal complication postsurgery, and imaging noted to have dependent infectious or inflammatory finding.  The patient denies any acute cough, shortness of breath or respiratory symptoms.  Clinically does not seem to have any signs or symptoms suggestive of pneumonia  PROCEDURES:  Critical Care performed: No  Procedures   MEDICATIONS ORDERED IN ED: Medications  sodium chloride 0.9 % bolus 500 mL (500 mLs Intravenous New Bag/Given 05/21/23 1511)     IMPRESSION / MDM / ASSESSMENT AND PLAN / ED COURSE  I reviewed the triage vital signs and the  nursing notes.                              Differential diagnosis includes, but is not limited to, dehydration, AKI, rule out cause such as obstructive finding or postoperative complication, etc.  He appears quite dry and hypovolemic.  Sodium corrects somewhat given his glucose.  History of diabetes.  Poor oral intake.  I did review the patient's presentation clinical history and CT findings with Dr. Aleen Campi.  He did not however formally consult with him, does not appear that the patient has evidence of an acute intra-abdominal process requiring surgical intervention at this time.  Rather does appear that he is quite dehydrated, and I suspect he would benefit from hydration and treatment for AKI dehydration.  Patient understand agreeable with plan for admission.  Consulted with and patient accepted to the hospitalist service by Dr. Meriam Sprague  Patient's presentation is most consistent with acute complicated illness / injury requiring diagnostic  workup.          FINAL CLINICAL IMPRESSION(S) / ED DIAGNOSES   Final diagnoses:  AKI (acute kidney injury) (HCC)  Dehydration, moderate     Rx / DC Orders   ED Discharge Orders     None        Note:  This document was prepared using Dragon voice recognition software and may include unintentional dictation errors.   Sharyn Creamer, MD 05/21/23 1650

## 2023-05-21 NOTE — Plan of Care (Signed)
  Problem: Education: Goal: Understanding of discharge needs will improve Outcome: Progressing Goal: Verbalization of understanding of the causes of altered bowel function will improve Outcome: Progressing   Problem: Activity: Goal: Ability to tolerate increased activity will improve Outcome: Progressing   Problem: Bowel/Gastric: Goal: Gastrointestinal status for postoperative course will improve Outcome: Progressing   Problem: Health Behavior/Discharge Planning: Goal: Identification of community resources to assist with postoperative recovery needs will improve Outcome: Progressing   Problem: Nutritional: Goal: Will attain and maintain optimal nutritional status will improve Outcome: Progressing   Problem: Clinical Measurements: Goal: Postoperative complications will be avoided or minimized Outcome: Progressing   Problem: Respiratory: Goal: Respiratory status will improve Outcome: Progressing   Problem: Skin Integrity: Goal: Will show signs of wound healing Outcome: Progressing   Problem: Education: Goal: Ability to describe self-care measures that may prevent or decrease complications (Diabetes Survival Skills Education) will improve Outcome: Progressing Goal: Individualized Educational Video(s) Outcome: Progressing   Problem: Coping: Goal: Ability to adjust to condition or change in health will improve Outcome: Progressing   Problem: Fluid Volume: Goal: Ability to maintain a balanced intake and output will improve Outcome: Progressing   Problem: Health Behavior/Discharge Planning: Goal: Ability to identify and utilize available resources and services will improve Outcome: Progressing Goal: Ability to manage health-related needs will improve Outcome: Progressing   Problem: Metabolic: Goal: Ability to maintain appropriate glucose levels will improve Outcome: Progressing   Problem: Nutritional: Goal: Maintenance of adequate nutrition will improve Outcome:  Progressing Goal: Progress toward achieving an optimal weight will improve Outcome: Progressing   Problem: Skin Integrity: Goal: Risk for impaired skin integrity will decrease Outcome: Progressing   Problem: Tissue Perfusion: Goal: Adequacy of tissue perfusion will improve Outcome: Progressing   Problem: Education: Goal: Knowledge of General Education information will improve Description: Including pain rating scale, medication(s)/side effects and non-pharmacologic comfort measures Outcome: Progressing   Problem: Health Behavior/Discharge Planning: Goal: Ability to manage health-related needs will improve Outcome: Progressing   Problem: Clinical Measurements: Goal: Ability to maintain clinical measurements within normal limits will improve Outcome: Progressing Goal: Will remain free from infection Outcome: Progressing Goal: Diagnostic test results will improve Outcome: Progressing Goal: Respiratory complications will improve Outcome: Progressing Goal: Cardiovascular complication will be avoided Outcome: Progressing   Problem: Activity: Goal: Risk for activity intolerance will decrease Outcome: Progressing   Problem: Nutrition: Goal: Adequate nutrition will be maintained Outcome: Progressing   Problem: Coping: Goal: Level of anxiety will decrease Outcome: Progressing   Problem: Elimination: Goal: Will not experience complications related to bowel motility Outcome: Progressing Goal: Will not experience complications related to urinary retention Outcome: Progressing   Problem: Pain Managment: Goal: General experience of comfort will improve Outcome: Progressing   Problem: Safety: Goal: Ability to remain free from injury will improve Outcome: Progressing   Problem: Skin Integrity: Goal: Risk for impaired skin integrity will decrease Outcome: Progressing   

## 2023-05-21 NOTE — Telephone Encounter (Signed)
CAll returned to [name] [name]. I informed her per [facility] that she needs to call his surgeon right away and if she does not hear back from them in 20 minutes,to take him to the ER. She agreed to this

## 2023-05-21 NOTE — H&P (Signed)
History and Physical    Patient: [name] [name] ZOX:[id number] DOB: [birth year] DOA: 05/21/2023 DOS: the patient was seen and examined on 05/21/2023 PCP: Corky Downs, MD  Patient coming from: Home  Chief Complaint: Nausea vomiting with poor oral intake Chief Complaint  Patient presents with   Nausea   HPI: [name] [name] is a 77 y.o. male with medical history significant of rectal adenocarcinoma s/p surgery with diverting loop ileostomy done June 2024 who underwent subsequent chemotherapy and completed this April 2024. Patient subsequently was discharged home and according to him ever since he got home his appetite has been very poor.  He has had associated nausea and vomiting but denies diarrhea.  He does have some abdominal pain around ileostomy site and also admits to intermittent leaking at the site of ileostomy.  Abdominal pain is of intensity 5/10 localized around ileostomy bag area.  Denies cough chest pain urinary complaints.  Upon arrival to the emergency Room patient underwent abdominal CT scan that did not show any acute intra-abdominal pathology except some findings of right lower lobe opacities. ED physician discussed with surgeon call and because there is no acute surgical indication for consult surgery team we will be on standby when needed.  Patient presented with significant elevation of renal function in the setting of severe dehydration requiring hospitalization.  Hospitalist service was therefore contacted to admit patient for further management.   Review of Systems: As mentioned in the history of present illness. All other systems reviewed and are negative. Past Medical History:  Diagnosis Date   Anemia    Anginal pain (HCC)    Bradycardia    Cancer (HCC)    Coronary artery disease    x 1 stent   Depression    Diabetes mellitus without complication (HCC)    GERD (gastroesophageal reflux disease)    History of kidney stones    Hyperlipidemia     Hypertension    Nephrolithiasis    Partial Large bowel obstruction (HCC)    Past Surgical History:  Procedure Laterality Date   CARDIAC CATHETERIZATION     CATARACT EXTRACTION     COLONOSCOPY WITH PROPOFOL N/A 05/05/2023   Procedure: COLONOSCOPY WITH PROPOFOL;  Surgeon: Regis Bill, MD;  Location: [facility] ENDOSCOPY;  Service: Endoscopy;  Laterality: N/A;  Patient having surgery tomorrow, 05/06/2023. Ok'd per [place] and office   CORONARY ANGIOPLASTY     EYE SURGERY     FLEXIBLE SIGMOIDOSCOPY N/A 07/27/2022   Procedure: FLEXIBLE SIGMOIDOSCOPY;  Surgeon: Regis Bill, MD;  Location: [facility] ENDOSCOPY;  Service: Endoscopy;  Laterality: N/A;   KIDNEY STONE SURGERY     NECK SURGERY  12/18/2011   nephrolithiasis     PORTACATH PLACEMENT Right 08/24/2022   Procedure: INSERTION PORT-A-CATH;  Surgeon: Campbell Lerner, MD;  Location: [facility] ORS;  Service: General;  Laterality: Right;   stent (other)     TRANSVERSE LOOP COLOSTOMY N/A 07/28/2022   Procedure: TRANSVERSE LOOP COLOSTOMY;  Surgeon: Campbell Lerner, MD;  Location: [facility] ORS;  Service: General;  Laterality: N/A;   XI ROBOTIC ASSISTED LOWER ANTERIOR RESECTION N/A 05/06/2023   Procedure: XI ROBOTIC ASSISTED LOWER ANTERIOR RECTOSIGMOID RESECTION, TAKEDOWN OF OLD LOOP COLOSTOMY WITH ANASTOMOSIS, MOBILIZATION OF SPLENIC FLEXURE OF COLON, AND NEW DIVERTING LOOP ILEOSTOMY, INTRAOPERATIVE ASSESSMENT OF TISSUE VASCULAR PERFUSION USING ICG (INDOCYANINE GREEN) IMMUNOFLUORESCENCE, FLEXIBLE SIGMOIDOSCOPY;  Surgeon: [name], MD;  Location: [facility] ORS;  Service: General;  Lateralit   Social History:  reports that he has been smoking cigarettes. He has  a 9.00 pack-year smoking history. He has never used smokeless tobacco. He reports that he does not drink alcohol and does not use drugs.  No Known Allergies  Family History  Problem Relation Age of Onset   Heart disease Father     Prior to Admission medications   Medication Sig Start Date End  Date Taking? Authorizing Provider  aspirin 81 MG EC tablet Take 81 mg by mouth daily.      [provider]  atorvastatin (LIPITOR) 40 MG tablet Take 1 tablet (40 mg total) by mouth daily. 02/08/23   Antonieta Iba, MD  diphenoxylate-atropine (LOMOTIL) 2.5-0.025 MG tablet Take 1 tablet by mouth 2 (two) times daily. 05/11/23   [name], MD  famotidine (PEPCID) 40 MG tablet Take 1 tablet (40 mg total) by mouth daily. 02/08/23   Antonieta Iba, MD  glimepiride (AMARYL) 4 MG tablet Take 4 mg by mouth daily with breakfast.    [provider]  lidocaine-prilocaine (EMLA) cream Apply to affected area once Patient not taking: Reported on 04/28/2023 08/18/22   Jeralyn Ruths, MD  metFORMIN (GLUCOPHAGE) 1000 MG tablet Take 1,000 mg by mouth 2 (two) times daily with a meal.    [provider]  nitroGLYCERIN (NITROSTAT) 0.4 MG SL tablet Place 1 tablet (0.4 mg total) under the tongue every 5 (five) minutes as needed for chest pain. Patient not taking: Reported on 04/28/2023 08/17/22   Antonieta Iba, MD  pioglitazone (ACTOS) 15 MG tablet Take 15 mg by mouth daily. 05/05/22   [provider]  traMADol (ULTRAM) 50 MG tablet Take 1-2 tablets (50-100 mg total) by mouth every 6 (six) hours as needed for moderate pain. 05/07/23   [name], MD    Physical Exam: Vitals:   05/21/23 1106 05/21/23 1107 05/21/23 1418  BP: 98/83  110/83  Pulse: 95  76  Resp: 20  18  Temp: 97.6 F (36.4 C)    SpO2: 100%  98%  Weight:  77.1 kg   Height:  6' (1.829 m)    General: Appears chronically ill in no distress HEENT: Normocephalic/atraumatic Cardiovascular: Normal heart sounds no murmur Abdomen: Mild tenderness around ileostomy bag site with no erythema noted Respiratory: Normal breath sounds CNS: Alert and oriented x 3 moving all extremities  Data Reviewed: I have reviewed patient's lab data, I discussed the case with ED physician, I reviewed patient's previous  records  Assessment and Plan:  Severe acute renal failure secondary to severe dehydration due to poor oral intake Patient presented with creatinine 2.4 however baseline creatinine 0.9 few weeks ago Continue IV fluid resuscitation Monitor renal function Renally dose all drugs Avoid nephrotoxic agents If renal function does not improve with hydration to consult nephrology Monitor input and output Continue as needed pain medication Plan of care discussed with ED physician during the time of admission I have personally reviewed patient's CT scan of the abdomen did not show any evidence of obstruction  Hyponatremia secondary to hypovolemia Continue normal saline 1 L sodium level closely  Possible right-sided pneumonia as seen on CT scan I will treat empirically with ceftriaxone and azithromycin  Diabetes mellitus type 2 with hyperglycemia as well as diabetic polyneuropathy Monitor glucose closely Placed on insulin therapy  Hypertension We will hold antihypertensives at this time given relative hypotension due to dehydration  Hyperlipidemia Resume statin therapy  Tobacco abuse disorder Patient considering possible nicotine patch  Coronary artery disease Resume home medications   DVT prophylaxis-continue Lovenox  Rectal adenocarcinoma s/p surgery with diverting loop ileostomy No acute intra-abdominal pathology found on CAT scan Surgery team on standby as needed Continue ileostomy care     Advance Care Planning:   Code Status: Prior DNI DNR I discussed CODE STATUS extensively with patient in the presence of patient's son who was present at bedside and at this time patient chose to be DNI DNR.  He however hinted that the wife may find his decision difficult but he will like to stick by it.  Consults: ED physician spoke with surgeon on-call however currently there is no acute postsurgical condition to be managed and surgery will be on standby if needed  Family  Communication: Discussed with patient's son present at bedside  Severity of Illness: The appropriate patient status for this patient is INPATIENT. Inpatient status is judged to be reasonable and necessary in order to provide the required intensity of service to ensure the patient's safety. The patient's presenting symptoms, physical exam findings, and initial radiographic and laboratory data in the context of their chronic comorbidities is felt to place them at high risk for further clinical deterioration. Furthermore, it is not anticipated that the patient will be medically stable for discharge from the hospital within 2 midnights of admission.   * I certify that at the point of admission it is my clinical judgment that the patient will require inpatient hospital care spanning beyond 2 midnights from the point of admission due to high intensity of service, high risk for further deterioration and high frequency of surveillance required.*  Author: Loyce Dys, MD 05/21/2023 4:37 PM  For on call review [email or web address].

## 2023-05-21 NOTE — ED Triage Notes (Signed)
Pt to ED for nausea intermittently for months. States just had surgery to adjust ileostomy on 6/20. C/o soreness to surgical site.

## 2023-05-21 NOTE — ED Notes (Signed)
Hospitalist at bedside 

## 2023-05-21 NOTE — Telephone Encounter (Signed)
Patient wife called reporting that patient had surgery and was discharged with instructions to call physician if he has symptoms. He reports that he is dizzy, is vomiting and unable to eat, has fatigue. She feels he is vomiting up his medications. She is asking if he can come in to be seen Please advise

## 2023-05-22 DIAGNOSIS — J189 Pneumonia, unspecified organism: Secondary | ICD-10-CM

## 2023-05-22 DIAGNOSIS — N179 Acute kidney failure, unspecified: Secondary | ICD-10-CM | POA: Diagnosis not present

## 2023-05-22 LAB — GLUCOSE, CAPILLARY
Glucose-Capillary: 136 mg/dL — ABNORMAL HIGH (ref 70–99)
Glucose-Capillary: 186 mg/dL — ABNORMAL HIGH (ref 70–99)
Glucose-Capillary: 100 mg/dL — ABNORMAL HIGH (ref 70–99)
Glucose-Capillary: 140 mg/dL — ABNORMAL HIGH (ref 70–99)

## 2023-05-22 LAB — CBC WITH DIFFERENTIAL/PLATELET
Abs Immature Granulocytes: 0.03 10*3/uL (ref 0.00–0.07)
Basophils Absolute: 0 10*3/uL (ref 0.0–0.1)
Basophils Relative: 0 %
Eosinophils Absolute: 0.1 10*3/uL (ref 0.0–0.5)
Eosinophils Relative: 1 %
HCT: 33.4 % — ABNORMAL LOW (ref 39.0–52.0)
Hemoglobin: 11.2 g/dL — ABNORMAL LOW (ref 13.0–17.0)
Immature Granulocytes: 0 %
Lymphocytes Relative: 7 %
Lymphs Abs: 0.5 10*3/uL — ABNORMAL LOW (ref 0.7–4.0)
MCH: 32.3 pg (ref 26.0–34.0)
MCHC: 33.5 g/dL (ref 30.0–36.0)
MCV: 96.3 fL (ref 80.0–100.0)
Monocytes Absolute: 0.9 10*3/uL (ref 0.1–1.0)
Monocytes Relative: 13 %
Neutro Abs: 5.6 10*3/uL (ref 1.7–7.7)
Neutrophils Relative %: 79 %
Platelets: 243 10*3/uL (ref 150–400)
RBC: 3.47 MIL/uL — ABNORMAL LOW (ref 4.22–5.81)
RDW: 12.9 % (ref 11.5–15.5)
WBC: 7.2 10*3/uL (ref 4.0–10.5)
nRBC: 0 % (ref 0.0–0.2)

## 2023-05-22 LAB — BASIC METABOLIC PANEL
Anion gap: 12 (ref 5–15)
BUN: 79 mg/dL — ABNORMAL HIGH (ref 8–23)
CO2: 22 mmol/L (ref 22–32)
Calcium: 8.8 mg/dL — ABNORMAL LOW (ref 8.9–10.3)
Chloride: 100 mmol/L (ref 98–111)
Creatinine, Ser: 1.58 mg/dL — ABNORMAL HIGH (ref 0.61–1.24)
GFR, Estimated: 45 mL/min — ABNORMAL LOW (ref >60)
Glucose, Bld: 118 mg/dL — ABNORMAL HIGH (ref 70–99)
Potassium: 3.9 mmol/L (ref 3.5–5.1)
Sodium: 134 mmol/L — ABNORMAL LOW (ref 135–145)

## 2023-05-22 MED ORDER — ENOXAPARIN SODIUM 40 MG/0.4ML IJ SOSY
40.0000 mg | PREFILLED_SYRINGE | Freq: Every day | INTRAMUSCULAR | Status: DC
  Administered 2023-05-22: 40 mg via SUBCUTANEOUS
  Filled 2023-05-22: qty 0.4

## 2023-05-22 MED ORDER — IPRATROPIUM-ALBUTEROL 0.5-2.5 (3) MG/3ML IN SOLN: 3.0000 mL | Freq: Four times a day (QID) | RESPIRATORY_TRACT | Status: DC | PRN

## 2023-05-22 MED ORDER — AZITHROMYCIN 250 MG PO TABS
500.0000 mg | ORAL_TABLET | Freq: Every day | ORAL | Status: DC
  Administered 2023-05-22: 500 mg via ORAL
  Filled 2023-05-22: qty 2

## 2023-05-22 NOTE — Progress Notes (Signed)
PHARMACIST - PHYSICIAN COMMUNICATION  CONCERNING:  Enoxaparin (Lovenox) for DVT Prophylaxis    RECOMMENDATION: Patient was prescribed enoxaprin 30mg  q24 hours for VTE prophylaxis.   Filed Weights   05/21/23 1107  Weight: 77.1 kg (170 lb)    Body mass index is 23.06 kg/m.  Estimated Creatinine Clearance: 44.1 mL/min (A) (by C-G formula based on SCr of 1.58 mg/dL (H)).   Patient is candidate for enoxaparin 40 mg every 24 hours based on CrCl >51ml/min  DESCRIPTION: Pharmacy has adjusted enoxaparin dose per [facility] policy.  Patient is now receiving enoxaparin 40 mg every 24 hours    Elliot Gurney, PharmD, BCPS Clinical Pharmacist  05/22/2023 12:17 PM

## 2023-05-22 NOTE — Progress Notes (Signed)
Mobility Specialist - Progress Note    05/22/23 1553  Mobility  Activity Ambulated independently in hallway;Dangled on edge of bed;Stood at bedside  Level of Assistance Independent  Assistive Device None  Distance Ambulated (ft) 180 ft  Range of Motion/Exercises Active  Activity Response Tolerated well  Mobility Referral Yes  $Mobility charge 1 Mobility  Mobility Specialist Start Time (ACUTE ONLY) 1520  Mobility Specialist Stop Time (ACUTE ONLY) 1540  Mobility Specialist Time Calculation (min) (ACUTE ONLY) 20 min   Pt resting in bed on RA upon entry. Pt STS and ambulates to hallway around NS with no AD. Pt returned to bed and left with needs in reach, bed alarm activated.   Johnathan Hausen Mobility Specialist 05/22/23, 4:06 PM

## 2023-05-22 NOTE — Progress Notes (Signed)
PHARMACIST - PHYSICIAN COMMUNICATION  CONCERNING: Antibiotic IV to Oral Route Change Policy  RECOMMENDATION: This patient is receiving azithromycin by the intravenous route.  Based on criteria approved by the Pharmacy and Therapeutics Committee, the antibiotic(s) is/are being converted to the equivalent oral dose form(s).  DESCRIPTION: These criteria include: Patient being treated for a respiratory tract infection, urinary tract infection, cellulitis or clostridium difficile associated diarrhea if on metronidazole The patient is not neutropenic and does not exhibit a GI malabsorption state The patient is eating (either orally or via tube) and/or has been taking other orally administered medications for a least 24 hours The patient is improving clinically and has a Tmax < 100.5  If you have questions about this conversion, please contact the Pharmacy Department   Tressie Ellis 05/22/23

## 2023-05-22 NOTE — Plan of Care (Signed)
  Problem: Activity: Goal: Ability to tolerate increased activity will improve Outcome: Progressing   Problem: Bowel/Gastric: Goal: Gastrointestinal status for postoperative course will improve Outcome: Progressing   Problem: Skin Integrity: Goal: Will show signs of wound healing Outcome: Progressing

## 2023-05-22 NOTE — Progress Notes (Signed)
PROGRESS NOTE    [name] [name]  MVH:[id number] DOB: 1946/07/25 DOA: 05/21/2023 PCP: Corky Downs, MD    Assessment & Plan:   Principal Problem:   AKI (acute kidney injury) (HCC)  Assessment and Plan: AKI: secondary to severe dehydration due to poor oral intake. Continue on IVFs. Cr is still elevated but trending down.   Hyponatremia: almost WNL today    Possible right-sided pneumonia: as per CT. Continue on IV rocephin, azithromycin, bronchodilators. Encourage incentive spirometry    DM2: well controlled, HbA1c 7.2. Continue on SSI w/ accuchecks    HTN: holding home dose of imdur    HLD: continue on statin    Tobacco abuse disorder: smoking cessation counseling   Hx of CAD: continue on aspirin, statin   Rectal adenocarcinoma: s/p surgery with diverting loop ileostomy. Continue w/ supportive care        DVT prophylaxis: lovenox  Code Status: DNR Family Communication:  Disposition Plan: likely d/c back home  Level of care: Telemetry Medical Status is: Inpatient Remains inpatient appropriate because: requiring IVFs & IV abxs    Consultants:    Procedures:   Antimicrobials: azithromycin, rocephin    Subjective: Pt c/o malaise   Objective: Vitals:   05/21/23 1754 05/21/23 1758 05/21/23 2122 05/22/23 0431  BP:  (!) 148/79 116/74 (!) 117/56  Pulse:  81 74 63  Resp:   20 19  Temp: 98.3 F (36.8 C) 98.3 F (36.8 C) 98 F (36.7 C) (!) 97.5 F (36.4 C)  TempSrc: Oral   Oral  SpO2:  98% 100% 100%  Weight:      Height:        Intake/Output Summary (Last 24 hours) at 05/22/2023 0822 Last data filed at 05/22/2023 0657 Gross per 24 hour  Intake 1581.33 ml  Output 700 ml  Net 881.33 ml   Filed Weights   05/21/23 1107  Weight: 77.1 kg    Examination:  General exam: Appears calm and comfortable  Respiratory system: Clear to auscultation. Respiratory effort normal. Cardiovascular system: S1 & S2 +. No rubs, gallops or clicks.  Gastrointestinal  system: Abdomen is nondistended, soft and nontender. Normal bowel sounds heard. Central nervous system: Alert and oriented. Moves all extremities  Psychiatry: Judgement and insight appear normal. Mood & affect appropriate.     Data Reviewed: I have personally reviewed following labs and imaging studies  CBC: Recent Labs  Lab 05/21/23 1109 05/22/23 0530  WBC 10.8* 7.2  NEUTROABS  --  5.6  HGB 13.3 11.2*  HCT 38.9* 33.4*  MCV 94.6 96.3  PLT 391 243   Basic Metabolic Panel: Recent Labs  Lab 05/21/23 1109 05/22/23 0530  NA 128* 134*  K 4.9 3.9  CL 91* 100  CO2 21* 22  GLUCOSE 272* 118*  BUN 98* 79*  CREATININE 2.43* 1.58*  CALCIUM 9.8 8.8*   GFR: Estimated Creatinine Clearance: 44.1 mL/min (A) (by C-G formula based on SCr of 1.58 mg/dL (H)). Liver Function Tests: Recent Labs  Lab 05/21/23 1109  AST 18  ALT 18  ALKPHOS 133*  BILITOT 1.0  PROT 8.3*  ALBUMIN 4.4   Recent Labs  Lab 05/21/23 1109  LIPASE 130*   No results for input(s): "AMMONIA" in the last 168 hours. Coagulation Profile: No results for input(s): "INR", "PROTIME" in the last 168 hours. Cardiac Enzymes: No results for input(s): "CKTOTAL", "CKMB", "CKMBINDEX", "TROPONINI" in the last 168 hours. BNP (last 3 results) No results for input(s): "PROBNP" in the last 8760 hours. HbA1C:  No results for input(s): "HGBA1C" in the last 72 hours. CBG: Recent Labs  Lab 05/21/23 1810 05/21/23 2222 05/22/23 0801  GLUCAP 175* 224* 136*   Lipid Profile: No results for input(s): "CHOL", "HDL", "LDLCALC", "TRIG", "CHOLHDL", "LDLDIRECT" in the last 72 hours. Thyroid Function Tests: No results for input(s): "TSH", "T4TOTAL", "FREET4", "T3FREE", "THYROIDAB" in the last 72 hours. Anemia Panel: No results for input(s): "VITAMINB12", "FOLATE", "FERRITIN", "TIBC", "IRON", "RETICCTPCT" in the last 72 hours. Sepsis Labs: No results for input(s): "PROCALCITON", "LATICACIDVEN" in the last 168 hours.  No results  found for this or any previous visit (from the past 240 hour(s)).       Radiology Studies: CT ABDOMEN PELVIS WO CONTRAST  Result Date: 05/21/2023 CLINICAL DATA:  Bowel obstruction suspected no contrast, AKI Recent ileostomy surgery 6/20.  Soreness at the surgical site. EXAM: CT ABDOMEN AND PELVIS WITHOUT CONTRAST TECHNIQUE: Multidetector CT imaging of the abdomen and pelvis was performed following the standard protocol without IV contrast. RADIATION DOSE REDUCTION: This exam was performed according to the departmental dose-optimization program which includes automated exposure control, adjustment of the mA and/or kV according to patient size and/or use of iterative reconstruction technique. COMPARISON:  Most recent comparison contrast-enhanced exam 07/26/2022 FINDINGS: Lower chest: Fine tree-in-bud opacities in the dependent right lower lobe. No pleural fluid. Normal heart size with coronary artery calcification. Hepatobiliary: No focal hepatic abnormality on this unenhanced exam. Gallbladder physiologically distended, no calcified stone. No biliary dilatation. Pancreas: No ductal dilatation or inflammation. Spleen: Normal in size without focal abnormality. Adrenals/Urinary Tract: No adrenal nodule. Cortical scarring in the mid right kidney. Calcifications of both renal hila may represent vascular calcifications or nonobstructing intrarenal stones. Cystic changes at the renal hila correspond to parapelvic cysts on prior CT, needing no further imaging follow-up. No ureteral dilatation or frank hydronephrosis. No evidence of ureteral stone. Unremarkable appearance of the urinary bladder. Stomach/Bowel: The detailed bowel assessment is limited in the absence of enteric contrast. Presumed loop ileostomy in the upper abdomen. Minimal edema in the small bowel mesentery is not unexpected given recent surgery. No peristomal hernia, focal fluid collection or evident complication. Enteric sutures in the transverse  colon. Chain sutures at the rectosigmoid junction. No small bowel distension or evidence of obstruction. There may be some adhesions involving small bowel in the mid pelvis with slight tethering. The stomach is nondistended. The appendix is not definitively seen. Vascular/Lymphatic: Advanced aortic atherosclerosis. No definite enlarged lymph nodes in the abdomen or pelvis. Reproductive: Prostate is unremarkable. Other: Mild presacral soft tissue thickening is nonspecific. No ascites, free air or focal fluid collection. Tiny fat containing umbilical hernia. Musculoskeletal: Lumbar degenerative disc disease and facet hypertrophy. Bilateral hip osteoarthritis. There are no acute or suspicious osseous abnormalities. IMPRESSION: 1. Ileostomy in the upper abdomen. Minimal edema in the small bowel mesentery is not unexpected given recent surgery. No peristomal hernia, focal fluid collection or evident complication. 2. There may be some adhesions involving small bowel in the mid pelvis with slight tethering. No bowel distension or evidence of obstruction. 3. Fine tree-in-bud opacities in the dependent right lower lobe, likely infectious or inflammatory. 4. Bilateral renal calcifications may represent vascular calcifications or nonobstructing intrarenal stones. No hydronephrosis. Aortic Atherosclerosis (ICD10-I70.0). Electronically Signed   By: Narda Rutherford M.D.   On: 05/21/2023 15:08        Scheduled Meds:  aspirin EC  81 mg Oral Daily   atorvastatin  40 mg Oral Daily   enoxaparin (LOVENOX) injection  30 mg  Subcutaneous QHS   famotidine  40 mg Oral Daily   insulin aspart  0-5 Units Subcutaneous QHS   insulin aspart  0-9 Units Subcutaneous TID WC   Continuous Infusions:  sodium chloride 100 mL/hr at 05/22/23 0657   azithromycin Stopped (05/21/23 2311)   cefTRIAXone (ROCEPHIN)  IV Stopped (05/21/23 1900)     LOS: 1 day    Time spent: 35 mins     Charise Killian, MD [facility] Pager  [phone]  If 7PM-7AM, please contact night-coverage [email or web address] 05/22/2023, 8:22 AM

## 2023-05-22 NOTE — TOC Initial Note (Signed)
Transition of Care [facility]) - Initial/Assessment Note    Patient Details  Name: [name] [name] MRN: [id number] Date of Birth: [birth year]  Transition of Care [facility]) CM/SW Contact:    Colette Ribas, LCSWA Phone Number: 05/22/2023, 3:54 PM  Clinical Narrative:        Patient known to [facility] was previously admission No TOC needs identified. See below readmission assessment.     Transition of Care Asessment: Insurance and Status: Insurance coverage has been reviewed Patient has primary care physician: Yes Home environment has been reviewed: Resides with spouse Prior level of function:: Indpendent at baseline Prior/Current Home Services: No current home services Social Determinants of Health Reivew: SDOH reviewed no interventions necessary Readmission risk has been reviewed: Yes Transition of care needs: no transition of care needs at this time       Patient Goals and CMS Choice            Expected Discharge Plan and Services                                              Prior Living Arrangements/Services                       Activities of Daily Living Home Assistive Devices/Equipment: CBG Meter, Dentures (specify type) ADL Screening (condition at time of admission) Patient's cognitive ability adequate to safely complete daily activities?: Yes Is the patient deaf or have difficulty hearing?: Yes Does the patient have difficulty seeing, even when wearing glasses/contacts?: No Does the patient have difficulty concentrating, remembering, or making decisions?: No Patient able to express need for assistance with ADLs?: Yes Does the patient have difficulty dressing or bathing?: No Independently performs ADLs?: Yes (appropriate for developmental age) Does the patient have difficulty walking or climbing stairs?: No Weakness of Legs: Both Weakness of Arms/Hands: None  Permission Sought/Granted                  Emotional Assessment               Admission diagnosis:  Dehydration, moderate [E86.0] AKI (acute kidney injury) (HCC) [N17.9] Patient Active Problem List   Diagnosis Date Noted   AKI (acute kidney injury) (HCC) 05/21/2023   Ileostomy in place [facility]) 05/06/2023   Irritant contact dermatitis associated with fecal stoma    Rectal adenocarcinoma (HCC) 08/07/2022   Type 2 diabetes mellitus with diabetic polyneuropathy, without long-term current use of insulin (HCC) 02/06/2020   Back pain 12/04/2014   Hyperlipidemia associated with type 2 diabetes mellitus (HCC) 04/04/2010   Tobacco abuse 04/04/2010   Hypertension associated with diabetes (HCC) 04/04/2010   Coronary artery disease 04/04/2010   PCP:  Corky Downs, MD Pharmacy:   [facility] - [place], [place] - [address] [place] [address] Phone: [phone] Fax: [phone]  [facility] [address] [place] - [address] [place] [address] Phone: [phone] Fax: [phone]  [facility] [address] [place] [address] Phone: [phone] Fax: [phone]     Social Determinants of Health (SDOH) Social History: SDOH Screenings   Food Insecurity: No Food Insecurity (05/21/2023)  Housing: Low Risk  (05/21/2023)  Transportation Needs: No Transportation Needs (05/21/2023)  Utilities: Not At Risk (05/21/2023)  Tobacco Use: High Risk (  05/21/2023)   SDOH Interventions:     Readmission Risk Interventions     No data to display

## 2023-05-23 DIAGNOSIS — N179 Acute kidney failure, unspecified: Secondary | ICD-10-CM | POA: Diagnosis not present

## 2023-05-23 LAB — GLUCOSE, CAPILLARY: Glucose-Capillary: 136 mg/dL — ABNORMAL HIGH (ref 70–99)

## 2023-05-23 LAB — CBC WITH DIFFERENTIAL/PLATELET
Abs Immature Granulocytes: 0.03 10*3/uL (ref 0.00–0.07)
Basophils Absolute: 0 10*3/uL (ref 0.0–0.1)
Basophils Relative: 1 %
Eosinophils Absolute: 0.1 10*3/uL (ref 0.0–0.5)
Eosinophils Relative: 2 %
HCT: 29.7 % — ABNORMAL LOW (ref 39.0–52.0)
Hemoglobin: 9.8 g/dL — ABNORMAL LOW (ref 13.0–17.0)
Immature Granulocytes: 1 %
Lymphocytes Relative: 8 %
Lymphs Abs: 0.4 10*3/uL — ABNORMAL LOW (ref 0.7–4.0)
MCH: 32.1 pg (ref 26.0–34.0)
MCHC: 33 g/dL (ref 30.0–36.0)
MCV: 97.4 fL (ref 80.0–100.0)
Monocytes Absolute: 0.7 10*3/uL (ref 0.1–1.0)
Monocytes Relative: 13 %
Neutro Abs: 4.2 10*3/uL (ref 1.7–7.7)
Neutrophils Relative %: 75 %
Platelets: 202 10*3/uL (ref 150–400)
RBC: 3.05 MIL/uL — ABNORMAL LOW (ref 4.22–5.81)
RDW: 12.9 % (ref 11.5–15.5)
WBC: 5.5 10*3/uL (ref 4.0–10.5)
nRBC: 0 % (ref 0.0–0.2)

## 2023-05-23 LAB — BASIC METABOLIC PANEL
Anion gap: 8 (ref 5–15)
BUN: 60 mg/dL — ABNORMAL HIGH (ref 8–23)
CO2: 23 mmol/L (ref 22–32)
Calcium: 8.2 mg/dL — ABNORMAL LOW (ref 8.9–10.3)
Chloride: 104 mmol/L (ref 98–111)
Creatinine, Ser: 1.31 mg/dL — ABNORMAL HIGH (ref 0.61–1.24)
GFR, Estimated: 57 mL/min — ABNORMAL LOW (ref >60)
Glucose, Bld: 128 mg/dL — ABNORMAL HIGH (ref 70–99)
Potassium: 4 mmol/L (ref 3.5–5.1)
Sodium: 135 mmol/L (ref 135–145)

## 2023-05-23 MED ORDER — AZITHROMYCIN 500 MG PO TABS: 500.0000 mg | ORAL_TABLET | Freq: Every day | ORAL | 0 refills | Status: AC

## 2023-05-23 MED ORDER — TRAMADOL HCL 50 MG PO TABS: 50.0000 mg | ORAL_TABLET | Freq: Four times a day (QID) | ORAL | 0 refills | Status: AC | PRN

## 2023-05-23 NOTE — Discharge Summary (Signed)
Physician Discharge Summary  [name] [name] ZOX:[id number] DOB: 15-Apr-1946 DOA: 05/21/2023  PCP: Corky Downs, MD  Admit date: 05/21/2023 Discharge date: 05/23/2023  Admitted From: home  Disposition:  home   Recommendations for Outpatient Follow-up:  Follow up with PCP in 1-2 weeks F/u w/ onco, Dr. Orlie Dakin, in 1 week   Home Health: no  Equipment/Devices:  Discharge Condition: stable  CODE STATUS: DNR Diet recommendation: Heart Healthy / Carb Modified   Brief/Interim Summary: HPI was taken from Dr. Meriam Sprague: [name] [name] is a 77 y.o. male with medical history significant of rectal adenocarcinoma s/p surgery with diverting loop ileostomy done June 2024 at [facility] who also completed chemotherapy April 2024. Patient was discharged home from his previous hospitalization in June 2024 and according to him ever since he got home his appetite has been very poor.  He has had associated nausea and vomiting but denies diarrhea.  He does have some abdominal pain around ileostomy site and also admits to intermittent leaking at the site of ileostomy.  Abdominal pain is of intensity 5/10 localized around ileostomy bag area.  Denies cough chest pain urinary complaints.  Upon arrival to the emergency Room, patient underwent abdominal CT scan that did not show any acute intra-abdominal pathology except some findings of right lower lobe opacities. ED physician discussed with surgeon on call and because there is no acute surgical indication for consult surgery team will be on standby when needed.  Patient presented with significant elevation of renal function in the setting of severe dehydration requiring hospitalization.  Hospitalist service was therefore contacted to admit patient for further management.  Discharge Diagnoses:  Principal Problem:   AKI (acute kidney injury) (HCC) AKI: secondary to severe dehydration due to poor oral intake & possible NSAID use. Continue on IVFs. Cr is  trending down daily. Received education on NSAID use and its effects on kidneys. Pt verbalized his understanding    Hyponatremia: resolved    Possible right-sided pneumonia: as per CT. Continue on IV rocephin, azithromycin, bronchodilators. Encourage incentive spirometry    DM2: well controlled, HbA1c 7.2. Continue on SSI w/ accuchecks    HTN: restart home dose of imdur at d/c    HLD: continue on statin    Tobacco abuse disorder: smoking cessation counseling   Hx of CAD: continue on aspirin, statin    Rectal adenocarcinoma: s/p surgery with diverting loop ileostomy. Continue w/ supportive care    Discharge Instructions  Discharge Instructions     Diet - low sodium heart healthy   Complete by: As directed    Diet Carb Modified   Complete by: As directed    Discharge instructions   Complete by: As directed    F/u w/ PCP in 1-2 weeks. F/u w/ onco, Dr. Orlie Dakin, w/in 1 week   Increase activity slowly   Complete by: As directed       Allergies as of 05/23/2023   No Known Allergies      Medication List     STOP taking these medications    lidocaine-prilocaine cream Commonly known as: EMLA       TAKE these medications    aspirin EC 81 MG tablet Take 81 mg by mouth daily.   atorvastatin 40 MG tablet Commonly known as: LIPITOR Take 1 tablet (40 mg total) by mouth daily.   azithromycin 500 MG tablet Commonly known as: Zithromax Take 1 tablet (500 mg total) by mouth daily for 2 days.   diphenoxylate-atropine 2.5-0.025 MG tablet  Commonly known as: LOMOTIL Take 1 tablet by mouth 2 (two) times daily.   famotidine 40 MG tablet Commonly known as: PEPCID Take 1 tablet (40 mg total) by mouth daily.   glimepiride 4 MG tablet Commonly known as: AMARYL Take 4 mg by mouth daily with breakfast.   isosorbide mononitrate 30 MG 24 hr tablet Commonly known as: IMDUR Take 30 mg by mouth daily.   metFORMIN 1000 MG tablet Commonly known as: GLUCOPHAGE Take 1,000 mg  by mouth 2 (two) times daily with a meal.   nitroGLYCERIN 0.4 MG SL tablet Commonly known as: NITROSTAT Place 1 tablet (0.4 mg total) under the tongue every 5 (five) minutes as needed for chest pain.   pioglitazone 15 MG tablet Commonly known as: ACTOS Take 15 mg by mouth daily.   prochlorperazine 10 MG tablet Commonly known as: COMPAZINE Take 10 mg by mouth every 6 (six) hours as needed for nausea or vomiting.   traMADol 50 MG tablet Commonly known as: ULTRAM Take 1-2 tablets (50-100 mg total) by mouth every 6 (six) hours as needed for up to 5 days for moderate pain or severe pain. What changed: reasons to take this        No Known Allergies  Consultations:    Procedures/Studies: CT ABDOMEN PELVIS WO CONTRAST  Result Date: 05/21/2023 CLINICAL DATA:  Bowel obstruction suspected no contrast, AKI Recent ileostomy surgery 6/20.  Soreness at the surgical site. EXAM: CT ABDOMEN AND PELVIS WITHOUT CONTRAST TECHNIQUE: Multidetector CT imaging of the abdomen and pelvis was performed following the standard protocol without IV contrast. RADIATION DOSE REDUCTION: This exam was performed according to the departmental dose-optimization program which includes automated exposure control, adjustment of the mA and/or kV according to patient size and/or use of iterative reconstruction technique. COMPARISON:  Most recent comparison contrast-enhanced exam 07/26/2022 FINDINGS: Lower chest: Fine tree-in-bud opacities in the dependent right lower lobe. No pleural fluid. Normal heart size with coronary artery calcification. Hepatobiliary: No focal hepatic abnormality on this unenhanced exam. Gallbladder physiologically distended, no calcified stone. No biliary dilatation. Pancreas: No ductal dilatation or inflammation. Spleen: Normal in size without focal abnormality. Adrenals/Urinary Tract: No adrenal nodule. Cortical scarring in the mid right kidney. Calcifications of both renal hila may represent vascular  calcifications or nonobstructing intrarenal stones. Cystic changes at the renal hila correspond to parapelvic cysts on prior CT, needing no further imaging follow-up. No ureteral dilatation or frank hydronephrosis. No evidence of ureteral stone. Unremarkable appearance of the urinary bladder. Stomach/Bowel: The detailed bowel assessment is limited in the absence of enteric contrast. Presumed loop ileostomy in the upper abdomen. Minimal edema in the small bowel mesentery is not unexpected given recent surgery. No peristomal hernia, focal fluid collection or evident complication. Enteric sutures in the transverse colon. Chain sutures at the rectosigmoid junction. No small bowel distension or evidence of obstruction. There may be some adhesions involving small bowel in the mid pelvis with slight tethering. The stomach is nondistended. The appendix is not definitively seen. Vascular/Lymphatic: Advanced aortic atherosclerosis. No definite enlarged lymph nodes in the abdomen or pelvis. Reproductive: Prostate is unremarkable. Other: Mild presacral soft tissue thickening is nonspecific. No ascites, free air or focal fluid collection. Tiny fat containing umbilical hernia. Musculoskeletal: Lumbar degenerative disc disease and facet hypertrophy. Bilateral hip osteoarthritis. There are no acute or suspicious osseous abnormalities. IMPRESSION: 1. Ileostomy in the upper abdomen. Minimal edema in the small bowel mesentery is not unexpected given recent surgery. No peristomal hernia, focal fluid collection or  evident complication. 2. There may be some adhesions involving small bowel in the mid pelvis with slight tethering. No bowel distension or evidence of obstruction. 3. Fine tree-in-bud opacities in the dependent right lower lobe, likely infectious or inflammatory. 4. Bilateral renal calcifications may represent vascular calcifications or nonobstructing intrarenal stones. No hydronephrosis. Aortic Atherosclerosis (ICD10-I70.0).  Electronically Signed   By: Narda Rutherford M.D.   On: 05/21/2023 15:08   (Echo, Carotid, EGD, Colonoscopy, ERCP)    Subjective: Pt denies any complaints    Discharge Exam: Vitals:   05/23/23 0351 05/23/23 1009  BP: (!) 120/51 (!) 114/53  Pulse: 65 64  Resp: 18 18  Temp: 97.7 F (36.5 C) 98 F (36.7 C)  SpO2: 100% 100%   Vitals:   05/22/23 1551 05/22/23 2028 05/23/23 0351 05/23/23 1009  BP: 115/62 138/77 (!) 120/51 (!) 114/53  Pulse: 70 70 65 64  Resp: 14  18 18   Temp: 98.4 F (36.9 C) 97.8 F (36.6 C) 97.7 F (36.5 C) 98 F (36.7 C)  TempSrc: Oral Oral Oral Oral  SpO2: 100% 100% 100% 100%  Weight:      Height:        General: Pt is alert, awake, not in acute distress Cardiovascular: S1/S2 +, no rubs, no gallops Respiratory: CTA bilaterally, no wheezing, no rhonchi Abdominal: Soft, NT, ND, bowel sounds +. Ostomy bag present w/ brown stool Extremities: no edema, no cyanosis    The results of significant diagnostics from this hospitalization (including imaging, microbiology, ancillary and laboratory) are listed below for reference.     Microbiology: No results found for this or any previous visit (from the past 240 hour(s)).   Labs: BNP (last 3 results) No results for input(s): "BNP" in the last 8760 hours. Basic Metabolic Panel: Recent Labs  Lab 05/21/23 1109 05/22/23 0530 05/23/23 0518  NA 128* 134* 135  K 4.9 3.9 4.0  CL 91* 100 104  CO2 21* 22 23  GLUCOSE 272* 118* 128*  BUN 98* 79* 60*  CREATININE 2.43* 1.58* 1.31*  CALCIUM 9.8 8.8* 8.2*   Liver Function Tests: Recent Labs  Lab 05/21/23 1109  AST 18  ALT 18  ALKPHOS 133*  BILITOT 1.0  PROT 8.3*  ALBUMIN 4.4   Recent Labs  Lab 05/21/23 1109  LIPASE 130*   No results for input(s): "AMMONIA" in the last 168 hours. CBC: Recent Labs  Lab 05/21/23 1109 05/22/23 0530 05/23/23 0518  WBC 10.8* 7.2 5.5  NEUTROABS  --  5.6 4.2  HGB 13.3 11.2* 9.8*  HCT 38.9* 33.4* 29.7*  MCV 94.6  96.3 97.4  PLT 391 243 202   Cardiac Enzymes: No results for input(s): "CKTOTAL", "CKMB", "CKMBINDEX", "TROPONINI" in the last 168 hours. BNP: Invalid input(s): "POCBNP" CBG: Recent Labs  Lab 05/22/23 0801 05/22/23 1209 05/22/23 1803 05/22/23 2129 05/23/23 0755  GLUCAP 136* 186* 100* 140* 136*   D-Dimer No results for input(s): "DDIMER" in the last 72 hours. Hgb A1c No results for input(s): "HGBA1C" in the last 72 hours. Lipid Profile No results for input(s): "CHOL", "HDL", "LDLCALC", "TRIG", "CHOLHDL", "LDLDIRECT" in the last 72 hours. Thyroid function studies No results for input(s): "TSH", "T4TOTAL", "T3FREE", "THYROIDAB" in the last 72 hours.  Invalid input(s): "FREET3" Anemia work up No results for input(s): "VITAMINB12", "FOLATE", "FERRITIN", "TIBC", "IRON", "RETICCTPCT" in the last 72 hours. Urinalysis    Component Value Date/Time   COLORURINE Yellow 12/20/2014 1419   APPEARANCEUR Clear 12/20/2014 1419   LABSPEC 1.036 12/20/2014 1419  PHURINE 5.0 12/20/2014 1419   GLUCOSEU >=500 12/20/2014 1419   HGBUR Negative 12/20/2014 1419   BILIRUBINUR Negative 12/20/2014 1419   KETONESUR 2+ 12/20/2014 1419   PROTEINUR Negative 12/20/2014 1419   NITRITE Negative 12/20/2014 1419   LEUKOCYTESUR Negative 12/20/2014 1419   Sepsis Labs Recent Labs  Lab 05/21/23 1109 05/22/23 0530 05/23/23 0518  WBC 10.8* 7.2 5.5   Microbiology No results found for this or any previous visit (from the past 240 hour(s)).   Time coordinating discharge: Over 30 minutes  SIGNED:   Charise Killian, MD  [facility] 05/23/2023, 12:05 PM Pager   If 7PM-7AM, please contact night-coverage [email or web address]

## 2023-05-23 NOTE — Plan of Care (Signed)
  Problem: Activity: Goal: Ability to tolerate increased activity will improve Outcome: Progressing   Problem: Bowel/Gastric: Goal: Gastrointestinal status for postoperative course will improve Outcome: Progressing   Problem: Coping: Goal: Ability to adjust to condition or change in health will improve Outcome: Progressing   Problem: Nutritional: Goal: Maintenance of adequate nutrition will improve Outcome: Progressing   Problem: Skin Integrity: Goal: Risk for impaired skin integrity will decrease Outcome: Progressing

## 2023-05-23 NOTE — Plan of Care (Signed)
Adequate for discharge. Care plan resolved.  [name] [name]

## 2023-05-24 ENCOUNTER — Inpatient Hospital Stay: Payer: Medicare HMO

## 2023-05-24 ENCOUNTER — Encounter: Payer: Self-pay | Admitting: Oncology

## 2023-05-24 ENCOUNTER — Inpatient Hospital Stay: Payer: Medicare HMO | Attending: Oncology | Admitting: Oncology

## 2023-05-24 VITALS — BP 137/68 | HR 64 | Temp 96.5°F | Resp 18 | Ht 72.0 in | Wt 165.0 lb

## 2023-05-24 DIAGNOSIS — Z87442 Personal history of urinary calculi: Secondary | ICD-10-CM | POA: Diagnosis not present

## 2023-05-24 DIAGNOSIS — C20 Malignant neoplasm of rectum: Secondary | ICD-10-CM | POA: Diagnosis not present

## 2023-05-24 DIAGNOSIS — D649 Anemia, unspecified: Secondary | ICD-10-CM | POA: Insufficient documentation

## 2023-05-24 DIAGNOSIS — R609 Edema, unspecified: Secondary | ICD-10-CM | POA: Insufficient documentation

## 2023-05-24 DIAGNOSIS — Z7984 Long term (current) use of oral hypoglycemic drugs: Secondary | ICD-10-CM | POA: Insufficient documentation

## 2023-05-24 DIAGNOSIS — F17210 Nicotine dependence, cigarettes, uncomplicated: Secondary | ICD-10-CM | POA: Insufficient documentation

## 2023-05-24 DIAGNOSIS — I10 Essential (primary) hypertension: Secondary | ICD-10-CM | POA: Diagnosis not present

## 2023-05-24 DIAGNOSIS — E1165 Type 2 diabetes mellitus with hyperglycemia: Secondary | ICD-10-CM | POA: Diagnosis not present

## 2023-05-24 DIAGNOSIS — Z79899 Other long term (current) drug therapy: Secondary | ICD-10-CM | POA: Insufficient documentation

## 2023-05-24 DIAGNOSIS — E119 Type 2 diabetes mellitus without complications: Secondary | ICD-10-CM | POA: Insufficient documentation

## 2023-05-24 DIAGNOSIS — N289 Disorder of kidney and ureter, unspecified: Secondary | ICD-10-CM | POA: Diagnosis not present

## 2023-05-24 DIAGNOSIS — K219 Gastro-esophageal reflux disease without esophagitis: Secondary | ICD-10-CM | POA: Insufficient documentation

## 2023-05-24 DIAGNOSIS — I2510 Atherosclerotic heart disease of native coronary artery without angina pectoris: Secondary | ICD-10-CM | POA: Insufficient documentation

## 2023-05-24 DIAGNOSIS — Z7982 Long term (current) use of aspirin: Secondary | ICD-10-CM | POA: Insufficient documentation

## 2023-05-24 DIAGNOSIS — K429 Umbilical hernia without obstruction or gangrene: Secondary | ICD-10-CM | POA: Diagnosis not present

## 2023-05-24 DIAGNOSIS — R000 Tachycardia, unspecified: Secondary | ICD-10-CM | POA: Diagnosis not present

## 2023-05-24 DIAGNOSIS — E785 Hyperlipidemia, unspecified: Secondary | ICD-10-CM | POA: Diagnosis not present

## 2023-05-24 DIAGNOSIS — I700 Atherosclerosis of aorta: Secondary | ICD-10-CM | POA: Insufficient documentation

## 2023-05-24 LAB — CBC WITH DIFFERENTIAL (CANCER CENTER ONLY)
Abs Immature Granulocytes: 0.02 10*3/uL (ref 0.00–0.07)
Basophils Absolute: 0 10*3/uL (ref 0.0–0.1)
Basophils Relative: 1 %
Eosinophils Absolute: 0.1 10*3/uL (ref 0.0–0.5)
Eosinophils Relative: 2 %
HCT: 32.9 % — ABNORMAL LOW (ref 39.0–52.0)
Hemoglobin: 10.8 g/dL — ABNORMAL LOW (ref 13.0–17.0)
Immature Granulocytes: 0 %
Lymphocytes Relative: 7 %
Lymphs Abs: 0.4 10*3/uL — ABNORMAL LOW (ref 0.7–4.0)
MCH: 32.1 pg (ref 26.0–34.0)
MCHC: 32.8 g/dL (ref 30.0–36.0)
MCV: 97.9 fL (ref 80.0–100.0)
Monocytes Absolute: 0.5 10*3/uL (ref 0.1–1.0)
Monocytes Relative: 9 %
Neutro Abs: 4.4 10*3/uL (ref 1.7–7.7)
Neutrophils Relative %: 81 %
Platelet Count: 218 10*3/uL (ref 150–400)
RBC: 3.36 MIL/uL — ABNORMAL LOW (ref 4.22–5.81)
RDW: 12.7 % (ref 11.5–15.5)
WBC Count: 5.4 10*3/uL (ref 4.0–10.5)
nRBC: 0 % (ref 0.0–0.2)

## 2023-05-24 LAB — CMP (CANCER CENTER ONLY)
ALT: 17 U/L (ref 0–44)
AST: 20 U/L (ref 15–41)
Albumin: 3.6 g/dL (ref 3.5–5.0)
Alkaline Phosphatase: 98 U/L (ref 38–126)
Anion gap: 8 (ref 5–15)
BUN: 51 mg/dL — ABNORMAL HIGH (ref 8–23)
CO2: 25 mmol/L (ref 22–32)
Calcium: 8.7 mg/dL — ABNORMAL LOW (ref 8.9–10.3)
Chloride: 101 mmol/L (ref 98–111)
Creatinine: 1.25 mg/dL — ABNORMAL HIGH (ref 0.61–1.24)
GFR, Estimated: >60 mL/min (ref >60)
Glucose, Bld: 220 mg/dL — ABNORMAL HIGH (ref 70–99)
Potassium: 3.9 mmol/L (ref 3.5–5.1)
Sodium: 134 mmol/L — ABNORMAL LOW (ref 135–145)
Total Bilirubin: 0.4 mg/dL (ref 0.3–1.2)
Total Protein: 6.9 g/dL (ref 6.5–8.1)

## 2023-05-24 NOTE — Progress Notes (Signed)
[facility]  [phone] [phone]  ID: [name] [name] OB: Mar 06, 1946  MR#: [id number]  QIO#:[id number]  Patient Care Team: Corky Downs, MD as PCP - General (Internal Medicine) Antonieta Iba, MD as PCP - Cardiology (Cardiology) Antonieta Iba, MD as Consulting Physician (Cardiology) Benita Gutter, RN as Oncology Nurse Navigator Orlie Dakin, Tollie Pizza, MD as Consulting Physician (Oncology) [name], MD as Consulting Physician (Colon and Rectal Surgery)  CHIEF COMPLAINT: Pathologic stage IIa rectal adenocarcinoma.  INTERVAL HISTORY: Patient returns to clinic today approximately 2 weeks postoperatively for further evaluation.  He underwent resection on May 06, 2023 which noted residual disease.  He tolerated his procedure well, but feels he has been slow to recover since that time.  He is recently admitted to the hospital for worsening dehydration.  He continues to have weakness and fatigue. He has no neurologic complaints.  He denies any recent fevers or illnesses.  He has no chest pain, shortness of breath, cough, or hemoptysis.  He denies any nausea, vomiting, constipation, or diarrhea.  He does not report any melena or hematochezia.  He has no urinary complaints.  Patient offers no further specific complaints today.  REVIEW OF SYSTEMS:   Review of Systems  Constitutional:  Positive for malaise/fatigue. Negative for fever and weight loss.  Respiratory: Negative.  Negative for cough, hemoptysis and shortness of breath.   Cardiovascular: Negative.  Negative for chest pain and leg swelling.  Gastrointestinal: Negative.  Negative for abdominal pain and diarrhea.  Genitourinary: Negative.  Negative for dysuria.  Musculoskeletal: Negative.  Negative for back pain.  Skin: Negative.  Negative for rash.  Neurological:  Positive for weakness. Negative for dizziness, focal weakness and headaches.  Psychiatric/Behavioral: Negative.  The  patient is not nervous/anxious.     As per HPI. Otherwise, a complete review of systems is negative.  PAST MEDICAL HISTORY: Past Medical History:  Diagnosis Date   Anemia    Anginal pain (HCC)    Bradycardia    Cancer (HCC)    Coronary artery disease    x 1 stent   Depression    Diabetes mellitus without complication (HCC)    GERD (gastroesophageal reflux disease)    History of kidney stones    Hyperlipidemia    Hypertension    Nephrolithiasis    Partial Large bowel obstruction (HCC)     PAST SURGICAL HISTORY: Past Surgical History:  Procedure Laterality Date   CARDIAC CATHETERIZATION     CATARACT EXTRACTION     COLONOSCOPY WITH PROPOFOL N/A 05/05/2023   Procedure: COLONOSCOPY WITH PROPOFOL;  Surgeon: Regis Bill, MD;  Location: [facility];  Service: Endoscopy;  Laterality: N/A;  Patient having surgery tomorrow, 05/06/2023. Ok'd per [place] and office   CORONARY ANGIOPLASTY     EYE SURGERY     FLEXIBLE SIGMOIDOSCOPY N/A 07/27/2022   Procedure: FLEXIBLE SIGMOIDOSCOPY;  Surgeon: Regis Bill, MD;  Location: [facility] ENDOSCOPY;  Service: Endoscopy;  Laterality: N/A;   KIDNEY STONE SURGERY     NECK SURGERY  12/18/2011   nephrolithiasis     PORTACATH PLACEMENT Right 08/24/2022   Procedure: INSERTION PORT-A-CATH;  Surgeon: Campbell Lerner, MD;  Location: [facility] ORS;  Service: General;  Laterality: Right;   stent (other)     TRANSVERSE LOOP COLOSTOMY N/A 07/28/2022   Procedure: TRANSVERSE LOOP COLOSTOMY;  Surgeon: Campbell Lerner, MD;  Location: [facility] ORS;  Service: General;  Laterality: N/A;   XI ROBOTIC ASSISTED LOWER ANTERIOR RESECTION N/A 05/06/2023  Procedure: XI ROBOTIC ASSISTED LOWER ANTERIOR RECTOSIGMOID RESECTION, TAKEDOWN OF OLD LOOP COLOSTOMY WITH ANASTOMOSIS, MOBILIZATION OF SPLENIC FLEXURE OF COLON, AND NEW DIVERTING LOOP ILEOSTOMY, INTRAOPERATIVE ASSESSMENT OF TISSUE VASCULAR PERFUSION USING ICG (INDOCYANINE GREEN) IMMUNOFLUORESCENCE, FLEXIBLE  SIGMOIDOSCOPY;  Surgeon: [name], MD;  Location: [facility] OR[facility];  Service: General;  Lateralit    FAMILY HISTORY: Family History  Problem Relation Age of Onset   Heart disease Father     ADVANCED DIRECTIVES (Y/N):  N  HEALTH MAINTENANCE: Social History   Tobacco Use   Smoking status: Every Day    Packs/day: 0.25    Years: 36.00    Additional pack years: 0.00    Total pack years: 9.00    Types: Cigarettes   Smokeless tobacco: Never   Tobacco comments:    Smokes 6 cigarettes daily   Vaping Use   Vaping Use: Never used  Substance Use Topics   Alcohol use: No   Drug use: No     Colonoscopy:  PAP:  Bone density:  Lipid panel:  No Known Allergies  Current Outpatient Medications  Medication Sig Dispense Refill   aspirin 81 MG EC tablet Take 81 mg by mouth daily.       atorvastatin (LIPITOR) 40 MG tablet Take 1 tablet (40 mg total) by mouth daily. 90 tablet 3   azithromycin (ZITHROMAX) 500 MG tablet Take 1 tablet (500 mg total) by mouth daily for 2 days. 2 tablet 0   diphenoxylate-atropine (LOMOTIL) 2.5-0.025 MG tablet Take 1 tablet by mouth 2 (two) times daily. 30 tablet 5   famotidine (PEPCID) 40 MG tablet Take 1 tablet (40 mg total) by mouth daily. 90 tablet 3   glimepiride (AMARYL) 4 MG tablet Take 4 mg by mouth daily with breakfast.     isosorbide mononitrate (IMDUR) 30 MG 24 hr tablet Take 30 mg by mouth daily.     metFORMIN (GLUCOPHAGE) 1000 MG tablet Take 1,000 mg by mouth 2 (two) times daily with a meal.     nitroGLYCERIN (NITROSTAT) 0.4 MG SL tablet Place 1 tablet (0.4 mg total) under the tongue every 5 (five) minutes as needed for chest pain. 25 tablet 6   pioglitazone (ACTOS) 15 MG tablet Take 15 mg by mouth daily.     prochlorperazine (COMPAZINE) 10 MG tablet Take 10 mg by mouth every 6 (six) hours as needed for nausea or vomiting.     traMADol (ULTRAM) 50 MG tablet Take 1-2 tablets (50-100 mg total) by mouth every 6 (six) hours as needed for up to 5 days for  moderate pain or severe pain. 20 tablet 0   No current facility-administered medications for this visit.    OBJECTIVE: Vitals:   05/24/23 1046  BP: 137/68  Pulse: 64  Resp: 18  Temp: (!) 96.5 F (35.8 C)  SpO2: 100%      Body mass index is 22.38 kg/m.    ECOG FS:0 - Asymptomatic  General: Well-developed, well-nourished, no acute distress. Eyes: Pink conjunctiva, anicteric sclera. HEENT: Normocephalic, moist mucous membranes. Lungs: No audible wheezing or coughing. Heart: Regular rate and rhythm. Abdomen: Soft, nontender, no obvious distention.  Ostomy bag noted. Musculoskeletal: No edema, cyanosis, or clubbing. Neuro: Alert, answering all questions appropriately. Cranial nerves grossly intact. Skin: No rashes or petechiae noted. Psych: Normal affect.  LAB RESULTS:  Lab Results  Component Value Date   NA 134 (L) 05/24/2023   K 3.9 05/24/2023   CL 101 05/24/2023   CO2 25 05/24/2023   GLUCOSE 220 (  H) 05/24/2023   BUN 51 (H) 05/24/2023   CREATININE 1.25 (H) 05/24/2023   CALCIUM 8.7 (L) 05/24/2023   PROT 6.9 05/24/2023   ALBUMIN 3.6 05/24/2023   AST 20 05/24/2023   ALT 17 05/24/2023   ALKPHOS 98 05/24/2023   BILITOT 0.4 05/24/2023   GFRNONAA >60 05/24/2023   GFRAA >60 12/20/2014    Lab Results  Component Value Date   WBC 5.4 05/24/2023   NEUTROABS 4.4 05/24/2023   HGB 10.8 (L) 05/24/2023   HCT 32.9 (L) 05/24/2023   MCV 97.9 05/24/2023   PLT 218 05/24/2023     STUDIES: CT ABDOMEN PELVIS WO CONTRAST  Result Date: 05/21/2023 CLINICAL DATA:  Bowel obstruction suspected no contrast, AKI Recent ileostomy surgery 6/20.  Soreness at the surgical site. EXAM: CT ABDOMEN AND PELVIS WITHOUT CONTRAST TECHNIQUE: Multidetector CT imaging of the abdomen and pelvis was performed following the standard protocol without IV contrast. RADIATION DOSE REDUCTION: This exam was performed according to the departmental dose-optimization program which includes automated exposure  control, adjustment of the mA and/or kV according to patient size and/or use of iterative reconstruction technique. COMPARISON:  Most recent comparison contrast-enhanced exam 07/26/2022 FINDINGS: Lower chest: Fine tree-in-bud opacities in the dependent right lower lobe. No pleural fluid. Normal heart size with coronary artery calcification. Hepatobiliary: No focal hepatic abnormality on this unenhanced exam. Gallbladder physiologically distended, no calcified stone. No biliary dilatation. Pancreas: No ductal dilatation or inflammation. Spleen: Normal in size without focal abnormality. Adrenals/Urinary Tract: No adrenal nodule. Cortical scarring in the mid right kidney. Calcifications of both renal hila may represent vascular calcifications or nonobstructing intrarenal stones. Cystic changes at the renal hila correspond to parapelvic cysts on prior CT, needing no further imaging follow-up. No ureteral dilatation or frank hydronephrosis. No evidence of ureteral stone. Unremarkable appearance of the urinary bladder. Stomach/Bowel: The detailed bowel assessment is limited in the absence of enteric contrast. Presumed loop ileostomy in the upper abdomen. Minimal edema in the small bowel mesentery is not unexpected given recent surgery. No peristomal hernia, focal fluid collection or evident complication. Enteric sutures in the transverse colon. Chain sutures at the rectosigmoid junction. No small bowel distension or evidence of obstruction. There may be some adhesions involving small bowel in the mid pelvis with slight tethering. The stomach is nondistended. The appendix is not definitively seen. Vascular/Lymphatic: Advanced aortic atherosclerosis. No definite enlarged lymph nodes in the abdomen or pelvis. Reproductive: Prostate is unremarkable. Other: Mild presacral soft tissue thickening is nonspecific. No ascites, free air or focal fluid collection. Tiny fat containing umbilical hernia. Musculoskeletal: Lumbar  degenerative disc disease and facet hypertrophy. Bilateral hip osteoarthritis. There are no acute or suspicious osseous abnormalities. IMPRESSION: 1. Ileostomy in the upper abdomen. Minimal edema in the small bowel mesentery is not unexpected given recent surgery. No peristomal hernia, focal fluid collection or evident complication. 2. There may be some adhesions involving small bowel in the mid pelvis with slight tethering. No bowel distension or evidence of obstruction. 3. Fine tree-in-bud opacities in the dependent right lower lobe, likely infectious or inflammatory. 4. Bilateral renal calcifications may represent vascular calcifications or nonobstructing intrarenal stones. No hydronephrosis. Aortic Atherosclerosis (ICD10-I70.0). Electronically Signed   By: Narda Rutherford M.D.   On: 05/21/2023 15:08    ASSESSMENT: Pathologic stage IIa rectal adenocarcinoma.  PLAN:    Pathologic stage IIa rectal adenocarcinoma: Diagnosis confirmed by imaging and biopsy.  MRI and CT scan results reviewed independently and reported as above confirming stage of disease.  Patient completed neoadjuvant FOLFOX on December 30, 2022 and neoadjuvant Xeloda/XRT on February 17, 2023.  He underwent radical resection on May 06, 2023 and was noted to have residual disease.  By report he had clear margins with 0 of 10 lymph nodes positive for malignancy.  No intervention is needed at this time.  Patient reports he has follow-up with surgery in the next several weeks and also believes his colostomy is reversible.  Return to clinic in mid August for further evaluation. Anemia: Chronic and unchanged.  Patient's hemoglobin is 10.8.   Thrombocytopenia: Resolved. Hyperglycemia: Patient's blood glucose is greater than 200 today.  Continue monitoring and treatment per primary care. Renal insufficiency: Creatinine improved to 1.25.  Monitor. Ostomy: Follow-up with surgeon as scheduled.  Patient expressed understanding and was in agreement  with this plan. He also understands that He can call clinic at any time with any questions, concerns, or complaints.    Cancer Staging  Rectal adenocarcinoma [facility]) Staging form: Colon and Rectum, AJCC 8th Edition - Clinical stage from 08/13/2022: Stage IIA (cT3, cN0, cM0) - Signed by Jeralyn Ruths, MD on 08/13/2022 Total positive nodes: 0 - Pathologic stage from 05/24/2023: Stage IIA (ypT3, pN0, cM0) - Signed by Jeralyn Ruths, MD on 05/24/2023 Stage prefix: Post-therapy Total positive nodes: 0  Jeralyn Ruths, MD   05/24/2023 1:55 PM

## 2023-05-27 DIAGNOSIS — Z452 Encounter for adjustment and management of vascular access device: Secondary | ICD-10-CM | POA: Diagnosis not present

## 2023-05-27 DIAGNOSIS — Z933 Colostomy status: Secondary | ICD-10-CM | POA: Diagnosis not present

## 2023-06-01 ENCOUNTER — Ambulatory Visit (HOSPITAL_COMMUNITY)
Admission: RE | Admit: 2023-06-01 | Discharge: 2023-06-01 | Disposition: A | Discharge status: Home / Self Care | Payer: Medicare HMO | Source: Ambulatory Visit | Attending: Nurse Practitioner | Admitting: Nurse Practitioner

## 2023-06-01 DIAGNOSIS — Z932 Ileostomy status: Secondary | ICD-10-CM | POA: Diagnosis not present

## 2023-06-01 DIAGNOSIS — L24B3 Irritant contact dermatitis related to fecal or urinary stoma or fistula: Secondary | ICD-10-CM | POA: Diagnosis not present

## 2023-06-01 DIAGNOSIS — R198 Other specified symptoms and signs involving the digestive system and abdomen: Secondary | ICD-10-CM

## 2023-06-01 DIAGNOSIS — C20 Malignant neoplasm of rectum: Secondary | ICD-10-CM | POA: Diagnosis not present

## 2023-06-01 NOTE — Progress Notes (Signed)
[facility]   Reason for visit:  LUQ loop ileostomy, in site of previous colostomy.   HPI:  REctal cancer with colostomy, revision to loop ileostomy in former colostomy site.  Past Medical History:  Diagnosis Date   Anemia    Anginal pain (HCC)    Bradycardia    Cancer (HCC)    Coronary artery disease    x 1 stent   Depression    Diabetes mellitus without complication (HCC)    GERD (gastroesophageal reflux disease)    History of kidney stones    Hyperlipidemia    Hypertension    Nephrolithiasis    Partial Large bowel obstruction (HCC)    Family History  Problem Relation Age of Onset   Heart disease Father    No Known Allergies Current Outpatient Medications  Medication Sig Dispense Refill Last Dose   aspirin 81 MG EC tablet Take 81 mg by mouth daily.        atorvastatin (LIPITOR) 40 MG tablet Take 1 tablet (40 mg total) by mouth daily. 90 tablet 3    diphenoxylate-atropine (LOMOTIL) 2.5-0.025 MG tablet Take 1 tablet by mouth 2 (two) times daily. 30 tablet 5    famotidine (PEPCID) 40 MG tablet Take 1 tablet (40 mg total) by mouth daily. 90 tablet 3    glimepiride (AMARYL) 4 MG tablet Take 4 mg by mouth daily with breakfast.      isosorbide mononitrate (IMDUR) 30 MG 24 hr tablet Take 30 mg by mouth daily.      metFORMIN (GLUCOPHAGE) 1000 MG tablet Take 1,000 mg by mouth 2 (two) times daily with a meal.      nitroGLYCERIN (NITROSTAT) 0.4 MG SL tablet Place 1 tablet (0.4 mg total) under the tongue every 5 (five) minutes as needed for chest pain. 25 tablet 6    pioglitazone (ACTOS) 15 MG tablet Take 15 mg by mouth daily.      prochlorperazine (COMPAZINE) 10 MG tablet Take 10 mg by mouth every 6 (six) hours as needed for nausea or vomiting.      No current facility-administered medications for this encounter.   ROS  Review of Systems  Constitutional:  Positive for fatigue.       Recent ED visit for IV hydration  Gastrointestinal:        LUQ ileostomy, revision  from colostomy  Genitourinary:        Urine clear and yellow today  Musculoskeletal:        Weakness  Skin:  Positive for rash.       Peristomal breakdown  Psychiatric/Behavioral: Negative.    All other systems reviewed and are negative.  Vital signs:  BP 133/83 (BP Location: Right Arm)   Pulse 92   Temp 97.6 F (36.4 C) (Oral)   Resp 20   SpO2 97%  Exam:  Physical Exam Vitals reviewed.  Constitutional:      Appearance: Normal appearance.  HENT:     Mouth/Throat:     Mouth: Mucous membranes are dry.     Comments: Trying to drink PO fluids, water and tea Abdominal:     Palpations: Abdomen is soft.     Comments: LUQ ileostomy below ribcage  Skin:    General: Skin is warm and dry.     Findings: Erythema and rash present.  Neurological:     Mental Status: He is alert and oriented to person, place, and time.  Psychiatric:        Mood and Affect:  Mood normal.        Behavior: Behavior normal.     Stoma type/location:  LUQ loop colostomy, oval shaped, near ribcage, contributing to seal loss multiple times daily.  Full thickness tissue loss to lower peristomal pouching area.  Stomal assessment/size:  1 3/4" but slightly oval Peristomal assessment:  denuded skin with some full thickness tissue loss along lower half Treatment options for stomal/peristomal skin: 1 piece convex pouch with stoma powder and skin prep, barrier ring and cutting barrier off center to avoid rib cage.  Adding belt for added security.  Output: liquid green stool Ostomy pouching: 1pc. Convex  with barrier ring  and belt.  Protect skin with stoma powder and skin prep Education provided:  Pouch change performed with patient and spouse, Provided new pattern/template for cutting the barrier    Impression/dx  Contact dermatitis High output ileostomy Discussion  Protect skin with powder and skin prep Cut barrier off center, template provided to avoid ribcage and ensure secure fit Add ostomy belt Plan   See back one week    Visit time: 45 minutes.   Maple Hudson FNP-BC

## 2023-06-04 DIAGNOSIS — R198 Other specified symptoms and signs involving the digestive system and abdomen: Secondary | ICD-10-CM | POA: Insufficient documentation

## 2023-06-07 ENCOUNTER — Ambulatory Visit (HOSPITAL_BASED_OUTPATIENT_CLINIC_OR_DEPARTMENT_OTHER)
Admission: RE | Admit: 2023-06-07 | Discharge: 2023-06-07 | Disposition: A | Discharge status: Home / Self Care | Payer: Medicare HMO | Source: Ambulatory Visit

## 2023-06-07 DIAGNOSIS — R112 Nausea with vomiting, unspecified: Secondary | ICD-10-CM | POA: Diagnosis present

## 2023-06-07 DIAGNOSIS — Z433 Encounter for attention to colostomy: Secondary | ICD-10-CM | POA: Insufficient documentation

## 2023-06-07 DIAGNOSIS — R198 Other specified symptoms and signs involving the digestive system and abdomen: Secondary | ICD-10-CM | POA: Diagnosis not present

## 2023-06-07 DIAGNOSIS — I152 Hypertension secondary to endocrine disorders: Secondary | ICD-10-CM | POA: Diagnosis present

## 2023-06-07 DIAGNOSIS — Z7984 Long term (current) use of oral hypoglycemic drugs: Secondary | ICD-10-CM | POA: Diagnosis not present

## 2023-06-07 DIAGNOSIS — Z932 Ileostomy status: Secondary | ICD-10-CM | POA: Diagnosis not present

## 2023-06-07 DIAGNOSIS — Z933 Colostomy status: Secondary | ICD-10-CM | POA: Diagnosis not present

## 2023-06-07 DIAGNOSIS — E785 Hyperlipidemia, unspecified: Secondary | ICD-10-CM | POA: Diagnosis present

## 2023-06-07 DIAGNOSIS — N179 Acute kidney failure, unspecified: Secondary | ICD-10-CM | POA: Diagnosis not present

## 2023-06-07 DIAGNOSIS — R197 Diarrhea, unspecified: Secondary | ICD-10-CM | POA: Diagnosis present

## 2023-06-07 DIAGNOSIS — I2510 Atherosclerotic heart disease of native coronary artery without angina pectoris: Secondary | ICD-10-CM | POA: Diagnosis present

## 2023-06-07 DIAGNOSIS — Z432 Encounter for attention to ileostomy: Secondary | ICD-10-CM | POA: Diagnosis not present

## 2023-06-07 DIAGNOSIS — E860 Dehydration: Secondary | ICD-10-CM | POA: Diagnosis not present

## 2023-06-07 DIAGNOSIS — E43 Unspecified severe protein-calorie malnutrition: Secondary | ICD-10-CM | POA: Diagnosis not present

## 2023-06-07 DIAGNOSIS — R269 Unspecified abnormalities of gait and mobility: Secondary | ICD-10-CM | POA: Diagnosis present

## 2023-06-07 DIAGNOSIS — Z87442 Personal history of urinary calculi: Secondary | ICD-10-CM | POA: Diagnosis not present

## 2023-06-07 DIAGNOSIS — Z7982 Long term (current) use of aspirin: Secondary | ICD-10-CM | POA: Diagnosis not present

## 2023-06-07 DIAGNOSIS — Z6821 Body mass index (BMI) 21.0-21.9, adult: Secondary | ICD-10-CM | POA: Diagnosis not present

## 2023-06-07 DIAGNOSIS — D631 Anemia in chronic kidney disease: Secondary | ICD-10-CM | POA: Diagnosis not present

## 2023-06-07 DIAGNOSIS — L259 Unspecified contact dermatitis, unspecified cause: Secondary | ICD-10-CM | POA: Insufficient documentation

## 2023-06-07 DIAGNOSIS — E1122 Type 2 diabetes mellitus with diabetic chronic kidney disease: Secondary | ICD-10-CM | POA: Diagnosis not present

## 2023-06-07 DIAGNOSIS — E1169 Type 2 diabetes mellitus with other specified complication: Secondary | ICD-10-CM | POA: Diagnosis present

## 2023-06-07 DIAGNOSIS — Z79899 Other long term (current) drug therapy: Secondary | ICD-10-CM | POA: Diagnosis not present

## 2023-06-07 DIAGNOSIS — L24B3 Irritant contact dermatitis related to fecal or urinary stoma or fistula: Secondary | ICD-10-CM

## 2023-06-07 DIAGNOSIS — N1832 Chronic kidney disease, stage 3b: Secondary | ICD-10-CM | POA: Diagnosis not present

## 2023-06-07 DIAGNOSIS — F17210 Nicotine dependence, cigarettes, uncomplicated: Secondary | ICD-10-CM | POA: Diagnosis present

## 2023-06-07 DIAGNOSIS — Z955 Presence of coronary angioplasty implant and graft: Secondary | ICD-10-CM | POA: Diagnosis not present

## 2023-06-07 DIAGNOSIS — Z8249 Family history of ischemic heart disease and other diseases of the circulatory system: Secondary | ICD-10-CM | POA: Diagnosis not present

## 2023-06-07 DIAGNOSIS — E1142 Type 2 diabetes mellitus with diabetic polyneuropathy: Secondary | ICD-10-CM | POA: Diagnosis present

## 2023-06-07 DIAGNOSIS — C20 Malignant neoplasm of rectum: Secondary | ICD-10-CM | POA: Diagnosis not present

## 2023-06-07 NOTE — Discharge Instructions (Signed)
Continue same pouch

## 2023-06-07 NOTE — Progress Notes (Signed)
[facility]   Reason for visit:  LUQ loop ileostomy, in site of previous colostomy HPI:   Past Medical History:  Diagnosis Date   Anemia    Anginal pain (HCC)    Bradycardia    Cancer (HCC)    Coronary artery disease    x 1 stent   Depression    Diabetes mellitus without complication (HCC)    GERD (gastroesophageal reflux disease)    History of kidney stones    Hyperlipidemia    Hypertension    Nephrolithiasis    Partial Large bowel obstruction (HCC)    Family History  Problem Relation Age of Onset   Heart disease Father    No Known Allergies Current Outpatient Medications  Medication Sig Dispense Refill Last Dose   aspirin 81 MG EC tablet Take 81 mg by mouth daily.        atorvastatin (LIPITOR) 40 MG tablet Take 1 tablet (40 mg total) by mouth daily. 90 tablet 3    diphenoxylate-atropine (LOMOTIL) 2.5-0.025 MG tablet Take 1 tablet by mouth 2 (two) times daily. 30 tablet 5    famotidine (PEPCID) 40 MG tablet Take 1 tablet (40 mg total) by mouth daily. 90 tablet 3    glimepiride (AMARYL) 4 MG tablet Take 4 mg by mouth daily with breakfast.      isosorbide mononitrate (IMDUR) 30 MG 24 hr tablet Take 30 mg by mouth daily.      metFORMIN (GLUCOPHAGE) 1000 MG tablet Take 1,000 mg by mouth 2 (two) times daily with a meal.      nitroGLYCERIN (NITROSTAT) 0.4 MG SL tablet Place 1 tablet (0.4 mg total) under the tongue every 5 (five) minutes as needed for chest pain. 25 tablet 6    pioglitazone (ACTOS) 15 MG tablet Take 15 mg by mouth daily.      prochlorperazine (COMPAZINE) 10 MG tablet Take 10 mg by mouth every 6 (six) hours as needed for nausea or vomiting.      No current facility-administered medications for this encounter.   ROS  Review of Systems  Constitutional:  Positive for fatigue.  Gastrointestinal:        LUQ ileostomy, former colostomy site, near rib cage.  Efferent stomal limb is flush, afferent limb slightly budded  Skin:  Positive for color change  and rash.       Peristomal breakdown, improving.  Erythema and scant weeping from 2 to 6, skin dips here and stoma is flush  Psychiatric/Behavioral:  Positive for confusion.        Intermittent confusion  All other systems reviewed and are negative.  Vital signs:  There were no vitals taken for this visit. Exam:  Physical Exam Constitutional:      Appearance: Normal appearance.  HENT:     Mouth/Throat:     Mouth: Mucous membranes are dry.     Comments: Given water. Drank 8 oz and stated he was thirsty. Encouraged PO intake. Reinforced with wife and son.  Abdominal:     Palpations: Abdomen is soft.     Comments: LUQ ileostomy  Skin:    General: Skin is warm and dry.     Findings: Erythema and rash present.  Neurological:     Mental Status: He is alert.     Gait: Gait abnormal.     Comments: weakness  Psychiatric:        Mood and Affect: Mood normal.        Behavior: Behavior normal.  Stoma type/location:  LUQ ileostomy, having some increased loose stools.   Stomal assessment/size:  1 3/4" oval, near rib cage   Efferent (distal) limb is flush with skin and empties into a crease.   Peristomal assessment:  Ribcage close to top half of pouching area.  Umbilicus creasing at 9 o'clock and flush limb of loop stoma create pouching challenges.  Wife is primary caregiver for ostomy care.  Here with son and wife today.  Treatment options for stomal/peristomal skin: Eakin seal to denuded skin, piece of barrier ring applied to umbilicus to create flat surface for pouching   Output: liquid green stool Ostomy pouching: 1pc. Flexible Convex with belt. Eakin seal  and barrier ring to umbilicus Education provided:  pouch change performed, pattern provided, samples provided.  WIll set up with [facility].   Patient and wife are unclear if he is taking Immodium or fiber.  They indicate they are seeing surgeon today after my visit.  I encourage them to discuss medications with Dr Michaell Cowing to ensure they  are following the prescribed regimen to thicken stool.  Discussed dehydration and encouraged PO intake.  I provide him with a glass of ice water due to observed dry mouth.  He drinks it readily and is heading out to have lunch as well.  Son and wife agree to encourage fluids.  States his urine is clear and yellow but volume is less.     Impression/dx  Contact dermatitis Ileostomy,  Discussion  See above Pouching to prevent leaks Immodium and Fiber Dehydration risk, blockage risk Plan  Back in 2 weeks to assess pouching Provided samples    Visit time: 55 minutes.   Maple Hudson FNP-BC

## 2023-06-08 ENCOUNTER — Other Ambulatory Visit (HOSPITAL_COMMUNITY): Payer: Self-pay | Admitting: Nurse Practitioner

## 2023-06-08 ENCOUNTER — Encounter (HOSPITAL_COMMUNITY): Payer: Self-pay | Admitting: Surgery

## 2023-06-08 ENCOUNTER — Other Ambulatory Visit: Payer: Self-pay | Admitting: Surgery

## 2023-06-08 ENCOUNTER — Other Ambulatory Visit: Payer: Self-pay

## 2023-06-08 ENCOUNTER — Inpatient Hospital Stay (HOSPITAL_COMMUNITY)
Admission: AD | Admit: 2023-06-08 | Discharge: 2023-06-10 | DRG: 640 | Disposition: A | Discharge status: Home Health | Payer: Medicare HMO | Attending: Surgery | Admitting: Surgery

## 2023-06-08 ENCOUNTER — Encounter (HOSPITAL_COMMUNITY): Payer: Self-pay

## 2023-06-08 DIAGNOSIS — E860 Dehydration: Secondary | ICD-10-CM

## 2023-06-08 DIAGNOSIS — Z8249 Family history of ischemic heart disease and other diseases of the circulatory system: Secondary | ICD-10-CM

## 2023-06-08 DIAGNOSIS — N1832 Chronic kidney disease, stage 3b: Secondary | ICD-10-CM | POA: Diagnosis present

## 2023-06-08 DIAGNOSIS — E1122 Type 2 diabetes mellitus with diabetic chronic kidney disease: Secondary | ICD-10-CM | POA: Diagnosis present

## 2023-06-08 DIAGNOSIS — R269 Unspecified abnormalities of gait and mobility: Secondary | ICD-10-CM | POA: Diagnosis present

## 2023-06-08 DIAGNOSIS — L24B3 Irritant contact dermatitis related to fecal or urinary stoma or fistula: Secondary | ICD-10-CM | POA: Diagnosis present

## 2023-06-08 DIAGNOSIS — E1169 Type 2 diabetes mellitus with other specified complication: Secondary | ICD-10-CM | POA: Diagnosis present

## 2023-06-08 DIAGNOSIS — Z79899 Other long term (current) drug therapy: Secondary | ICD-10-CM

## 2023-06-08 DIAGNOSIS — E43 Unspecified severe protein-calorie malnutrition: Secondary | ICD-10-CM | POA: Diagnosis present

## 2023-06-08 DIAGNOSIS — C20 Malignant neoplasm of rectum: Secondary | ICD-10-CM | POA: Diagnosis present

## 2023-06-08 DIAGNOSIS — M549 Dorsalgia, unspecified: Secondary | ICD-10-CM | POA: Diagnosis present

## 2023-06-08 DIAGNOSIS — I2510 Atherosclerotic heart disease of native coronary artery without angina pectoris: Secondary | ICD-10-CM | POA: Diagnosis present

## 2023-06-08 DIAGNOSIS — Z7984 Long term (current) use of oral hypoglycemic drugs: Secondary | ICD-10-CM | POA: Diagnosis not present

## 2023-06-08 DIAGNOSIS — Z432 Encounter for attention to ileostomy: Secondary | ICD-10-CM | POA: Diagnosis not present

## 2023-06-08 DIAGNOSIS — Z955 Presence of coronary angioplasty implant and graft: Secondary | ICD-10-CM

## 2023-06-08 DIAGNOSIS — R112 Nausea with vomiting, unspecified: Secondary | ICD-10-CM | POA: Diagnosis present

## 2023-06-08 DIAGNOSIS — Z932 Ileostomy status: Secondary | ICD-10-CM

## 2023-06-08 DIAGNOSIS — D631 Anemia in chronic kidney disease: Secondary | ICD-10-CM | POA: Diagnosis present

## 2023-06-08 DIAGNOSIS — R198 Other specified symptoms and signs involving the digestive system and abdomen: Secondary | ICD-10-CM

## 2023-06-08 DIAGNOSIS — I152 Hypertension secondary to endocrine disorders: Secondary | ICD-10-CM | POA: Diagnosis present

## 2023-06-08 DIAGNOSIS — E1142 Type 2 diabetes mellitus with diabetic polyneuropathy: Secondary | ICD-10-CM | POA: Diagnosis present

## 2023-06-08 DIAGNOSIS — Z7982 Long term (current) use of aspirin: Secondary | ICD-10-CM | POA: Diagnosis not present

## 2023-06-08 DIAGNOSIS — E785 Hyperlipidemia, unspecified: Secondary | ICD-10-CM | POA: Diagnosis present

## 2023-06-08 DIAGNOSIS — F17210 Nicotine dependence, cigarettes, uncomplicated: Secondary | ICD-10-CM | POA: Diagnosis present

## 2023-06-08 DIAGNOSIS — D638 Anemia in other chronic diseases classified elsewhere: Secondary | ICD-10-CM | POA: Insufficient documentation

## 2023-06-08 DIAGNOSIS — Z6821 Body mass index (BMI) 21.0-21.9, adult: Secondary | ICD-10-CM

## 2023-06-08 DIAGNOSIS — Z87442 Personal history of urinary calculi: Secondary | ICD-10-CM

## 2023-06-08 DIAGNOSIS — R197 Diarrhea, unspecified: Secondary | ICD-10-CM | POA: Diagnosis present

## 2023-06-08 DIAGNOSIS — N179 Acute kidney failure, unspecified: Secondary | ICD-10-CM | POA: Diagnosis present

## 2023-06-08 DIAGNOSIS — E1159 Type 2 diabetes mellitus with other circulatory complications: Secondary | ICD-10-CM | POA: Diagnosis present

## 2023-06-08 LAB — GLUCOSE, CAPILLARY
Glucose-Capillary: 164 mg/dL — ABNORMAL HIGH (ref 70–99)
Glucose-Capillary: 111 mg/dL — ABNORMAL HIGH (ref 70–99)

## 2023-06-08 MED ORDER — TRAMADOL HCL 50 MG PO TABS: 50.0000 mg | ORAL_TABLET | Freq: Four times a day (QID) | ORAL | Status: DC | PRN

## 2023-06-08 MED ORDER — ONDANSETRON HCL 4 MG/2ML IJ SOLN: 4.0000 mg | Freq: Four times a day (QID) | INTRAMUSCULAR | Status: DC | PRN

## 2023-06-08 MED ORDER — NITROGLYCERIN 0.4 MG SL SUBL: 0.4000 mg | SUBLINGUAL_TABLET | SUBLINGUAL | Status: DC | PRN

## 2023-06-08 MED ORDER — LACTATED RINGERS IV BOLUS
2000.0000 mL | Freq: Once | INTRAVENOUS | Status: AC
  Administered 2023-06-08: 1000 mL via INTRAVENOUS

## 2023-06-08 MED ORDER — BISACODYL 10 MG RE SUPP: 10.0000 mg | Freq: Two times a day (BID) | RECTAL | Status: DC | PRN

## 2023-06-08 MED ORDER — MAGIC MOUTHWASH: 15.0000 mL | Freq: Four times a day (QID) | ORAL | Status: DC | PRN

## 2023-06-08 MED ORDER — SIMETHICONE 40 MG/0.6ML PO SUSP: 80.0000 mg | Freq: Four times a day (QID) | ORAL | Status: DC | PRN

## 2023-06-08 MED ORDER — SODIUM CHLORIDE 0.9 % IV SOLN: 8.0000 mg | Freq: Four times a day (QID) | INTRAVENOUS | Status: DC | PRN

## 2023-06-08 MED ORDER — PROCHLORPERAZINE EDISYLATE 10 MG/2ML IJ SOLN: 5.0000 mg | INTRAMUSCULAR | Status: DC | PRN

## 2023-06-08 MED ORDER — LOPERAMIDE HCL 2 MG PO CAPS
2.0000 mg | ORAL_CAPSULE | Freq: Two times a day (BID) | ORAL | Status: DC
  Administered 2023-06-08 – 2023-06-10 (×5): 2 mg via ORAL
  Filled 2023-06-08 (×5): qty 1

## 2023-06-08 MED ORDER — METHOCARBAMOL 1000 MG/10ML IJ SOLN: 1000.0000 mg | Freq: Four times a day (QID) | INTRAVENOUS | Status: DC | PRN

## 2023-06-08 MED ORDER — ALUM & MAG HYDROXIDE-SIMETH 200-200-20 MG/5ML PO SUSP: 30.0000 mL | Freq: Four times a day (QID) | ORAL | Status: DC | PRN

## 2023-06-08 MED ORDER — LOPERAMIDE HCL 2 MG PO CAPS: 2.0000 mg | ORAL_CAPSULE | Freq: Four times a day (QID) | ORAL | Status: DC | PRN

## 2023-06-08 MED ORDER — INSULIN ASPART 100 UNIT/ML IJ SOLN
0.0000 [IU] | Freq: Three times a day (TID) | INTRAMUSCULAR | Status: DC
  Administered 2023-06-10: 3 [IU] via SUBCUTANEOUS

## 2023-06-08 MED ORDER — LIDOCAINE 4 % EX CREA
TOPICAL_CREAM | Freq: Once | CUTANEOUS | Status: AC
  Filled 2023-06-08: qty 5

## 2023-06-08 MED ORDER — MENTHOL 3 MG MT LOZG: 1.0000 | LOZENGE | OROMUCOSAL | Status: DC | PRN

## 2023-06-08 MED ORDER — ASPIRIN 81 MG PO TBEC
81.0000 mg | DELAYED_RELEASE_TABLET | Freq: Every day | ORAL | Status: DC
  Administered 2023-06-08 – 2023-06-10 (×3): 81 mg via ORAL
  Filled 2023-06-08 (×3): qty 1

## 2023-06-08 MED ORDER — FAMOTIDINE 20 MG PO TABS
40.0000 mg | ORAL_TABLET | Freq: Every day | ORAL | Status: DC
  Administered 2023-06-08 – 2023-06-10 (×3): 40 mg via ORAL
  Filled 2023-06-08 (×3): qty 2

## 2023-06-08 MED ORDER — LIDOCAINE HCL URETHRAL/MUCOSAL 2 % EX GEL: 1.0000 | Freq: Three times a day (TID) | CUTANEOUS | Status: DC | PRN

## 2023-06-08 MED ORDER — GLIMEPIRIDE 4 MG PO TABS
4.0000 mg | ORAL_TABLET | Freq: Every day | ORAL | Status: DC
  Administered 2023-06-09 – 2023-06-10 (×2): 4 mg via ORAL
  Filled 2023-06-08 (×2): qty 1

## 2023-06-08 MED ORDER — DIPHENHYDRAMINE HCL 12.5 MG/5ML PO ELIX: 12.5000 mg | ORAL_SOLUTION | Freq: Four times a day (QID) | ORAL | Status: DC | PRN

## 2023-06-08 MED ORDER — CALCIUM POLYCARBOPHIL 625 MG PO TABS
625.0000 mg | ORAL_TABLET | Freq: Two times a day (BID) | ORAL | Status: DC
  Administered 2023-06-08 – 2023-06-10 (×5): 625 mg via ORAL
  Filled 2023-06-08 (×5): qty 1

## 2023-06-08 MED ORDER — ATORVASTATIN CALCIUM 40 MG PO TABS
40.0000 mg | ORAL_TABLET | Freq: Every day | ORAL | Status: DC
  Administered 2023-06-08 – 2023-06-10 (×3): 40 mg via ORAL
  Filled 2023-06-08 (×3): qty 1

## 2023-06-08 MED ORDER — ENOXAPARIN SODIUM 30 MG/0.3ML IJ SOSY
30.0000 mg | PREFILLED_SYRINGE | INTRAMUSCULAR | Status: DC
  Administered 2023-06-09: 30 mg via SUBCUTANEOUS
  Filled 2023-06-08: qty 0.3

## 2023-06-08 MED ORDER — LACTATED RINGERS IV BOLUS: 1000.0000 mL | Freq: Three times a day (TID) | INTRAVENOUS | Status: AC | PRN

## 2023-06-08 MED ORDER — PHENOL 1.4 % MT LIQD: 2.0000 | OROMUCOSAL | Status: DC | PRN

## 2023-06-08 MED ORDER — ONDANSETRON 4 MG PO TBDP: 4.0000 mg | ORAL_TABLET | Freq: Four times a day (QID) | ORAL | Status: DC | PRN

## 2023-06-08 MED ORDER — DIPHENHYDRAMINE HCL 50 MG/ML IJ SOLN: 12.5000 mg | Freq: Four times a day (QID) | INTRAMUSCULAR | Status: DC | PRN

## 2023-06-08 MED ORDER — INSULIN ASPART 100 UNIT/ML IJ SOLN: 0.0000 [IU] | Freq: Every day | INTRAMUSCULAR | Status: DC

## 2023-06-08 MED ORDER — METOPROLOL TARTRATE 5 MG/5ML IV SOLN: 5.0000 mg | Freq: Four times a day (QID) | INTRAVENOUS | Status: DC | PRN

## 2023-06-08 MED ORDER — HYDROMORPHONE HCL 1 MG/ML IJ SOLN: 0.5000 mg | INTRAMUSCULAR | Status: DC | PRN

## 2023-06-08 MED ORDER — LACTATED RINGERS IV SOLN: INTRAVENOUS | Status: DC

## 2023-06-08 NOTE — Plan of Care (Signed)
  Problem: Education: Goal: Knowledge of General Education information will improve Description: Including pain rating scale, medication(s)/side effects and non-pharmacologic comfort measures Outcome: Progressing   Problem: Clinical Measurements: Goal: Ability to maintain clinical measurements within normal limits will improve Outcome: Progressing Goal: Cardiovascular complication will be avoided Outcome: Progressing   Problem: Coping: Goal: Level of anxiety will decrease Outcome: Progressing   Problem: Pain Managment: Goal: General experience of comfort will improve Outcome: Progressing   Problem: Safety: Goal: Ability to remain free from injury will improve Outcome: Progressing   Problem: Skin Integrity: Goal: Risk for impaired skin integrity will decrease Outcome: Progressing

## 2023-06-08 NOTE — Consult Note (Signed)
WOC Nurse ostomy consult note Stoma type/location: loop ileostomy; former colostomy; LUQ just below ribcage  Seen in clinic for peristomal skin issues related to leakage/location Stomal assessment/size: 1 3/4"; slightly oval shaped Peristomal assessment: treated in ostomy clinic yesterday for denuded skin along distal aspect of peristomal skin Treatment options for stomal/peristomal skin: crusting with ostomy powder, barrier ring, cutting barrier off center to avoid ribcage Belt added, however needs smaller belt, no medium belts in house.  Output; liquid stool  Ostomy pouching: 1pc.soft convex, barrier rings, belt  Education provided: see clinic notes  Enrolled patient in [employer] DC program: Yes, previously requested medium belt to be sent  K. Mayo WOC, FNP sent script yesterday for Felisa Bonier for supplies   Family aware.   Requested bedside nursing to give imodium scheduled instead of PRN  WOC Nurse will follow along with you for continued support with ostomy teaching and care Gracianna Vink [facility] MSN, RN, [place], CNS, [place] [phone]

## 2023-06-08 NOTE — H&P (Addendum)
06/08/2023    PROVIDER:  Jarrett Soho, MD   Patient Care Team: Corky Downs, MD as PCP - General (Cardiovascular Disease) Mia Creek, Janit Bern, MD as Consulting Provider (Gastroenterology) Michaell Cowing, Shawn Route, MD as Consulting Provider (Colon and Rectal Surgery) Chrystal, Gordy Councilman, MD (Radiation Oncology) Hulen Shouts, MD (Cardiovascular Disease) Eber Hong, MD (Hematology and Oncology)   [facility] MRN: [id number] DOB: 05/16/1946 DATE OF ENCOUNTER: 06/07/2023       Interval History:    The patient returns to the office after undergoing robotic low anterior rectosigmoid resection with takedown of loop colostomy and diverting loop ileostomy 05/06/2023    Pathology: PATHOLOGY   FINAL MICROSCOPIC DIAGNOSIS:  A. COLOSTOMY: Colostomy. Negative for malignancy.  B. RECTOSIGMOID, RESECTION: Invasive adenocarcinoma, moderately differentiated associated with extensive fibrosis. Carcinoma extends into perirectal connective tissue. All surgical margins negative for carcinoma. Ten lymph nodes negative for metastatic carcinoma (0/10). Pathologic Stage Classification (pTNM, AJCC 8th Edition): ypT3, ypN0  C. FINAL DISTAL MARGIN: Benign colon. Distal margin negative for carcinoma.   Patient returns for first postop visit.  He went home postop day #5.  He had some nausea vomiting and called the oncology department.  We never really heard about it.  They went to an outside hospital was admitted for a few days for dehydration.  They never followed up with an ostomy clinic but called our office about this.  We push to set that up 3 weeks later.  Went to ostomy clinic last week.  Some irritation skin.  He is felt rather nauseated and has been hesitant to eat much in the way of solid food.  He has been doing boost and Ensure.  Drinking liquids.  He thinks he has been taking nausea medicines but he do not does not know what it is.  He is here with his wife and she does not  know what it is.  I cannot find it on the formulary.  He says his glucose is running around 150.  He is having about 4-5 bowel movements through the ileostomy which actually is not that bad.  I do not think he is taking any fiber and Imodium at this time.          Labs, Imaging and Diagnostic Testing:   Located in 'Care Everywhere' section of Epic EMR chart     PRIOR [facility] CLINIC NOTES:   Located in 'Care Everywhere' section of Epic EMR chart     SURGERY NOTES:   Located in 'Care Everywhere' section of Epic EMR chart     PATHOLOGY:   Located in 'Care Everywhere' section of Epic EMR chart         Physical Examination:    There is no height or weight on file to calculate BMI.     Constitutional: Even more thin.  Borderline cachectic.  Tired but not toxic.  Hygeine adequate.   Eyes: Normal extraocular movements. Sclera nonicteric Neuro: No major focal sensory defects.  No major motor deficits. Psych:  No severe agitation.  No severe anxiety.  Judgment & insight Adequate, Oriented x4, HENT: Normocephalic, Mucus membranes moist.  No thrush.   Neck: Supple, No tracheal deviation.   Chest: Good respiratory excursion.  No audible wheezing CV:  No major extremity edema Ext: No obvious deformity or contracture.  Edema: not present.  No cyanosis Skin:  Warm and dry Musculoskeletal:   Mobility: no assist device moves with minimal assistance   Abdomen: Incisions Clean & dry with normal healing  ridge Nontender.  Soft.   Nondistended.   Ileostomy left upper quadrant and old ostomy site.  Pink.  Some thin and oatmeal consistency effluent in bag.  There is some skin rash around it but no active leaking today     Gen:  Inguinal hernia: Not present.  Inguinal lymph nodes: without lymphadenopathy.     Rectal: (Deferred)           Assessment and Plan:    [name] [name] is a 77 y.o. male gurgling status post robotic low anterior resection with takedown of diverting distal  transverse colostomy and creation of diverting loop ileostomy to old ostomy site..   Diagnoses and all orders for this visit:   Rectal adenocarcinoma (CMS/HHS-HCC) -     CBC with Diff, Platelet, NLR - [facility]; Future -     CMP14+eGFR - [facility]; Future   Nausea and vomiting, unspecified vomiting type -     CBC with Diff, Platelet, NLR - [facility]; Future -     CMP14+eGFR - [facility]; Future   Ileostomy in place (CMS/HHS-HCC)   Other orders -     ondansetron (ZOFRAN-ODT) 4 MG disintegrating tablet; Take 1 tablet (4 mg total) by mouth every 8 (eight) hours as needed for Nausea for up to 7 days       Struggling after low anterior resection with diverting loop ileostomy.   I think he is having problems with ileostomy diarrhea.  I again strongly recommend he take a fiber supplement such as Benefiber -one dose a day to thicken his ileostomy contents and then start using Imodium to slow things down.  Current taking 2 mg Imodium twice a day.  Can increase up to 2 pills 4 times a day as needed.  If he can control his diarrhea, he will have less problems with leaking and other issues.   Continue following up at the ostomy clinic for help.   Ordered labs to be done as soon as possible.  If he is getting more dehydrated, he may need to be readmitted or they are very least get some IV fluid infusions so he does not get into trouble.   I recommend he try and get some solid food in.  Even try some supplemental shakes.  Keep an eye on his diabetes.  Sent a prescription for some nausea meds since they did not know what he was taking with for nausea.  Last prescription I see his Compazine preoperatively.  Will try ondansetron instead.   Once he gets through this phase, can consider diverting loop ileostomy takedown.  I usually wait until 3 months from the last surgery = 08/06/2019 for the soonest time.  Consider getting a contrasted enema and August next month to make sure the anastomosis is healed up and make  sure there is no delayed leak.  CAT scan done last week was reassuring for no obstruction abscess or leak that could be another cause of his issues.   Continue diabetic control.   ###########################  Addendum: Creatinine elevated 2.47 with persistent nausea.  I think he needs admission with IV fluids PICC line placement and then set up for outpatient IV therapy.  I think he is failed outpatient management and needs to be more aggressively resuscitated and stabilized.  Plan admission to [facility] campus.`     The plan was discussed in detail with the patient today, who expressed understanding & appreciation.  The patient has my contact information, and understands to call me with any  additional questions or concerns in the interval.  I would be happy to see the patient back sooner if the need arises.   Of note, portions of this report may have been transcribed using voice recognition software. Every effort was made to ensure accuracy; however, inadvertent computerized transcription errors may be present.   Any transcriptional errors that result from this process are unintentional.     Ardeth Sportsman, MD, FACS, MASCRS Esophageal, Gastrointestinal & Colorectal Surgery Robotic and Minimally Invasive Surgery   [facility]  [address] [place], [place] [address] [phone] Fax [phone] Main

## 2023-06-08 NOTE — Progress Notes (Signed)
Arrived to patient room to access port.  Pt stated he wanted numbing cream applied first.  Primary RN was made aware and will notify IV team when the pt is ready for port to be accessed.

## 2023-06-09 DIAGNOSIS — D638 Anemia in other chronic diseases classified elsewhere: Secondary | ICD-10-CM | POA: Insufficient documentation

## 2023-06-09 DIAGNOSIS — N1832 Chronic kidney disease, stage 3b: Secondary | ICD-10-CM | POA: Insufficient documentation

## 2023-06-09 HISTORY — DX: Anemia in other chronic diseases classified elsewhere: D63.8

## 2023-06-09 LAB — GLUCOSE, CAPILLARY
Glucose-Capillary: 109 mg/dL — ABNORMAL HIGH (ref 70–99)
Glucose-Capillary: 85 mg/dL (ref 70–99)
Glucose-Capillary: 111 mg/dL — ABNORMAL HIGH (ref 70–99)
Glucose-Capillary: 139 mg/dL — ABNORMAL HIGH (ref 70–99)

## 2023-06-09 LAB — RETICULOCYTES
Immature Retic Fract: 3.8 % (ref 2.3–15.9)
RBC.: 2.92 MIL/uL — ABNORMAL LOW (ref 4.22–5.81)
Retic Count, Absolute: 26.3 10*3/uL (ref 19.0–186.0)
Retic Ct Pct: 0.9 % (ref 0.4–3.1)

## 2023-06-09 LAB — CBC
HCT: 28.4 % — ABNORMAL LOW (ref 39.0–52.0)
Hemoglobin: 9.7 g/dL — ABNORMAL LOW (ref 13.0–17.0)
MCH: 32 pg (ref 26.0–34.0)
MCHC: 34.2 g/dL (ref 30.0–36.0)
MCV: 93.7 fL (ref 80.0–100.0)
Platelets: 117 10*3/uL — ABNORMAL LOW (ref 150–400)
RBC: 3.03 MIL/uL — ABNORMAL LOW (ref 4.22–5.81)
RDW: 12.6 % (ref 11.5–15.5)
WBC: 6.1 10*3/uL (ref 4.0–10.5)
nRBC: 0 % (ref 0.0–0.2)

## 2023-06-09 LAB — HEPATIC FUNCTION PANEL
ALT: 20 U/L (ref 0–44)
AST: 19 U/L (ref 15–41)
Albumin: 2.9 g/dL — ABNORMAL LOW (ref 3.5–5.0)
Alkaline Phosphatase: 70 U/L (ref 38–126)
Bilirubin, Direct: <0.1 mg/dL (ref 0.0–0.2)
Total Bilirubin: 0.7 mg/dL (ref 0.3–1.2)
Total Protein: 5.5 g/dL — ABNORMAL LOW (ref 6.5–8.1)

## 2023-06-09 LAB — IRON AND TIBC
Iron: 91 ug/dL (ref 45–182)
Saturation Ratios: 31 % (ref 17.9–39.5)
TIBC: 295 ug/dL (ref 250–450)
UIBC: 204 ug/dL

## 2023-06-09 LAB — BASIC METABOLIC PANEL
Anion gap: 11 (ref 5–15)
BUN: 79 mg/dL — ABNORMAL HIGH (ref 8–23)
CO2: 23 mmol/L (ref 22–32)
Calcium: 8.6 mg/dL — ABNORMAL LOW (ref 8.9–10.3)
Chloride: 100 mmol/L (ref 98–111)
Creatinine, Ser: 2.14 mg/dL — ABNORMAL HIGH (ref 0.61–1.24)
GFR, Estimated: 31 mL/min — ABNORMAL LOW (ref >60)
Glucose, Bld: 105 mg/dL — ABNORMAL HIGH (ref 70–99)
Potassium: 3.8 mmol/L (ref 3.5–5.1)
Sodium: 134 mmol/L — ABNORMAL LOW (ref 135–145)

## 2023-06-09 LAB — FERRITIN: Ferritin: 100 ng/mL (ref 24–336)

## 2023-06-09 LAB — VITAMIN B12: Vitamin B-12: 2089 pg/mL — ABNORMAL HIGH (ref 180–914)

## 2023-06-09 LAB — LIPASE, BLOOD: Lipase: 52 U/L — ABNORMAL HIGH (ref 11–51)

## 2023-06-09 LAB — PHOSPHORUS: Phosphorus: 4.3 mg/dL (ref 2.5–4.6)

## 2023-06-09 LAB — FOLATE: Folate: 21.8 ng/mL (ref >5.9)

## 2023-06-09 LAB — PREALBUMIN: Prealbumin: 25 mg/dL (ref 18–38)

## 2023-06-09 LAB — MAGNESIUM: Magnesium: 1.9 mg/dL (ref 1.7–2.4)

## 2023-06-09 MED ORDER — ONDANSETRON HCL 4 MG PO TABS: 4.0000 mg | ORAL_TABLET | Freq: Three times a day (TID) | ORAL | Status: DC

## 2023-06-09 MED ORDER — GLUCERNA SHAKE PO LIQD
237.0000 mL | Freq: Two times a day (BID) | ORAL | Status: DC
  Administered 2023-06-09 – 2023-06-10 (×2): 237 mL via ORAL
  Filled 2023-06-09 (×4): qty 237

## 2023-06-09 MED ORDER — LACTATED RINGERS IV BOLUS
1000.0000 mL | Freq: Once | INTRAVENOUS | Status: AC
  Administered 2023-06-09: 1000 mL via INTRAVENOUS

## 2023-06-09 MED ORDER — CHLORHEXIDINE GLUCONATE CLOTH 2 % EX PADS: 6.0000 | MEDICATED_PAD | Freq: Every day | CUTANEOUS | Status: DC

## 2023-06-09 MED ORDER — TAB-A-VITE/IRON PO TABS
1.0000 | ORAL_TABLET | Freq: Every day | ORAL | Status: DC
  Administered 2023-06-09 – 2023-06-10 (×2): 1 via ORAL
  Filled 2023-06-09 (×2): qty 1

## 2023-06-09 MED ORDER — FERROUS SULFATE 325 (65 FE) MG PO TABS
325.0000 mg | ORAL_TABLET | Freq: Two times a day (BID) | ORAL | Status: DC
  Administered 2023-06-09 – 2023-06-10 (×3): 325 mg via ORAL
  Filled 2023-06-09 (×3): qty 1

## 2023-06-09 MED ORDER — CHLORHEXIDINE GLUCONATE CLOTH 2 % EX PADS
6.0000 | MEDICATED_PAD | Freq: Every day | CUTANEOUS | Status: DC
  Administered 2023-06-09 – 2023-06-10 (×2): 6 via TOPICAL

## 2023-06-09 NOTE — TOC Initial Note (Addendum)
Transition of Care [facility]) - Initial/Assessment Note    Patient Details  Name: [name] [name] MRN: [id number] Date of Birth: Nov 19, 1945  Transition of [facility]) CM/SW Contact:    Howell Rucks, RN Phone Number: 06/09/2023, 10:06 AM  Clinical Narrative:  HH RN order for ileostomy care/teaching and IV fluids 3x/week.  NCM  call to [employer] rep Elita Quick, reports she will run insurance to confirm coverage for home IV fluids.   TOC will continue to follow.     -10:35am [facility], rep-Cory accepted for [facility] RN for IV fluids and Ileostomy care/teaching.            -11:57 [employer] rep-Pam , confirmed pt has coverage for IVF, reports she spoke with pt's spouse regarding the plan, plan for dc home July 26th with services to start Monday July 29th, added to AVS.  12:10pm Met with pt at bedside to introduce role of TOC/NCM and review for dc planning, updated pt on arrangements for [facility] RN for administration of home IV fluids and teaching/care of ileostomy, pt voiced understanding. Pt reports he has a PCP and pharmacy in place, home DME: several walkers, reports he has DME that was previously owned by his parents. Pt reports his son will provide transportation at discharge.  Pt had no questions/concerns. TOC will continue to follow.   -4:18pm PT recommendation HH PT, [facility] contacted rep-Cory, confirmed acceptance of pt for [facility] PT.         Patient Goals and CMS Choice            Expected Discharge Plan and Services                                              Prior Living Arrangements/Services                       Activities of Daily Living Home Assistive Devices/Equipment: None ADL Screening (condition at time of admission) Patient's cognitive ability adequate to safely complete daily activities?: Yes Is the patient deaf or have difficulty hearing?: No Does the patient have difficulty seeing, even when wearing glasses/contacts?:  No Does the patient have difficulty concentrating, remembering, or making decisions?: No Patient able to express need for assistance with ADLs?: Yes Does the patient have difficulty dressing or bathing?: No Independently performs ADLs?: Yes (appropriate for developmental age) Does the patient have difficulty walking or climbing stairs?: No Weakness of Legs: None Weakness of Arms/Hands: None  Permission Sought/Granted                  Emotional Assessment              Admission diagnosis:  Dehydration [E86.0] Patient Active Problem List   Diagnosis Date Noted   CKD stage 3b, GFR 30-44 ml/min (HCC) 06/09/2023   Anemia of chronic disease 06/09/2023   Dehydration 06/08/2023   Nausea and vomiting 06/07/2023   High output ileostomy (HCC) 06/04/2023   AKI (acute kidney injury) (HCC) 05/21/2023   Ileostomy in place [facility]) 05/06/2023   Irritant contact dermatitis associated with fecal stoma    Rectal adenocarcinoma (HCC) 08/07/2022   Type 2 diabetes mellitus with diabetic polyneuropathy, without long-term current use of insulin (HCC) 02/06/2020   Back pain 12/04/2014   Hyperlipidemia associated with type 2 diabetes mellitus (HCC) 04/04/2010   Tobacco  abuse 04/04/2010   Hypertension associated with diabetes (HCC) 04/04/2010   Coronary artery disease 04/04/2010   PCP:  Corky Downs, MD Pharmacy:   [facility] - [place], [place] - [address] [place] [address] Phone: [phone] Fax: [phone]  [facility] [address], [place] - [address] [place] [address] Phone: [phone] Fax: [phone]  [facility] [address] [place] [address] Phone: [phone] Fax: [phone]     Social Determinants of Health (SDOH) Social History: SDOH Screenings   Food Insecurity: No Food Insecurity (06/08/2023)  Housing: Low Risk  (06/08/2023)  Transportation Needs: No  Transportation Needs (06/08/2023)  Utilities: Not At Risk (06/08/2023)  Tobacco Use: High Risk (06/08/2023)   SDOH Interventions:     Readmission Risk Interventions    06/09/2023   10:05 AM  Readmission Risk Prevention Plan  Transportation Screening Complete  Medication Review (RN Care Manager) Complete  PCP or Specialist appointment within 3-5 days of discharge Complete  HRI or Home Care Consult Complete  SW Recovery Care/Counseling Consult Complete  Palliative Care Screening Not Applicable  Skilled Nursing Facility Not Applicable

## 2023-06-09 NOTE — Evaluation (Signed)
Physical Therapy Evaluation Patient Details Name: Britt Petroni MRN: [id number] DOB: 14-Apr-1946 Today's Date: 06/09/2023  History of Present Illness  77 yo male presents to therapy following hospital admission due to dehydration associated with poor PO intake and reports of nausea. Pt is post op s/p diverting loop colostomy 05/06/2023 secondary to colon obstruction due to rectal ca. Pt PMH includes but is not limited to: anemia, angina, bradycardia, ca, CAD s/p stent placement, depression, DM II, GERD, kidney stones, HLD, HTN, nephrolithiasis, partial large bowl obstruction, cervical surgery, and tobacco abuse.  Clinical Impression    Pt admitted with above diagnosis.  Pt currently with functional limitations due to the deficits listed below (see PT Problem List). Pt seated EOB when PT arrived. Pt reported he has been in and out of the hospital recently secondary to dehydration. Pt sates he has been nauseated with vomiting, poor PO intake and weight loss. Pt reports abdominal pain at surgery site. Pt is mod I with bed mobility, S for safety with transfer tasks, gait tasks with S and IV pole for 200 feet. Pt left seated EOB, with nursing and all needs in place. Pt will benefit from acute skilled PT to increase their independence and safety with mobility to allow discharge.   Bp semi reclined 108/66 (55 PR) Bp seated EOB 102/57 (56 PR) Bp immediate standing 88/59 (65 PR) Bp s/p 3 min standing 107/63 (77 PR) Pt denies dizziness at time of eval, reports he was having some dizziness prior to admission to hospital.      Assistance Recommended at Discharge Set up Supervision/Assistance  If plan is discharge home, recommend the following:  Can travel by private vehicle  Assistance with cooking/housework;Assist for transportation        Equipment Recommendations None recommended by PT  Recommendations for Other Services       Functional Status Assessment Patient has had a recent decline in  their functional status and demonstrates the ability to make significant improvements in function in a reasonable and predictable amount of time.     Precautions / Restrictions Precautions Precautions: Fall Precaution Comments: ostomy, O2 >88% Restrictions Weight Bearing Restrictions: No      Mobility  Bed Mobility Overal bed mobility: Modified Independent             General bed mobility comments: HOB elevated, pt seated EOB when PT arrived    Transfers Overall transfer level: Needs assistance Equipment used: None Transfers: Sit to/from Stand Sit to Stand: Supervision           General transfer comment: min cues for safety    Ambulation/Gait Ambulation/Gait assistance: Supervision Gait Distance (Feet): 200 Feet Assistive device: IV Pole Gait Pattern/deviations: Step-through pattern Gait velocity: slightly decreased     General Gait Details: O2 monitored s/p exertion with gait tasks 100% on RA and no reprots of SOB  Stairs            Wheelchair Mobility     Tilt Bed    Modified Rankin (Stroke Patients Only)       Balance Overall balance assessment: Needs assistance, History of Falls Sitting-balance support: Feet supported Sitting balance-Leahy Scale: Good     Standing balance support: Bilateral upper extremity supported, During functional activity Standing balance-Leahy Scale: Fair Standing balance comment: UE support at IV pole for gait and no UE support for static standing no evidence of LOB  Pertinent Vitals/Pain Pain Assessment Pain Assessment: 0-10 Pain Score: 5  Pain Location: abdomen Pain Descriptors / Indicators: Aching, Discomfort, Constant Pain Intervention(s): Limited activity within patient's tolerance, Monitored during session    Home Living Family/patient expects to be discharged to:: Private residence Living Arrangements: Spouse/significant other;Children Available Help at  Discharge: Family Type of Home: House Home Access: Stairs to enter Entrance Stairs-Rails: Right;Left;Can reach both Entrance Stairs-Number of Steps: 12   Home Layout: Two level;Laundry or work area in [employer]: None      Prior Function Prior Level of Function : Independent/Modified Independent             Mobility Comments: IND without AD for all ADLs, self care tasks       Hand Dominance        Extremity/Trunk Assessment   Upper Extremity Assessment Upper Extremity Assessment: Overall WFL for tasks assessed    Lower Extremity Assessment Lower Extremity Assessment: Overall WFL for tasks assessed    Cervical / Trunk Assessment Cervical / Trunk Assessment:  (slight head forward)  Communication   Communication: No difficulties  Cognition Arousal/Alertness: Awake/alert Behavior During Therapy: WFL for tasks assessed/performed Overall Cognitive Status: Within Functional Limits for tasks assessed                                          General Comments      Exercises     Assessment/Plan    PT Assessment Patient needs continued PT services  PT Problem List Decreased activity tolerance;Pain       PT Treatment Interventions Gait training;Stair training;Neuromuscular re-education;Patient/family education    PT Goals (Current goals can be found in the Care Plan section)  Acute Rehab PT Goals Patient Stated Goal: to be able to go to the beach, mow the lawn and wash the car PT Goal Formulation: With patient Time For Goal Achievement: 06/30/23 Potential to Achieve Goals: Good    Frequency Min 1X/week     Co-evaluation               AM-PAC PT "6 Clicks" Mobility  Outcome Measure Help needed turning from your back to your side while in a flat bed without using bedrails?: None Help needed moving from lying on your back to sitting on the side of a flat bed without using bedrails?: None Help needed moving to and from a  bed to a chair (including a wheelchair)?: A Little Help needed standing up from a chair using your arms (e.g., wheelchair or bedside chair)?: A Little Help needed to walk in hospital room?: A Little Help needed climbing 3-5 steps with a railing? : A Lot 6 Click Score: 19    End of Session Equipment Utilized During Treatment: Gait belt Activity Tolerance: Patient tolerated treatment well Patient left: in bed;with call bell/phone within reach;with nursing/sitter in room Nurse Communication: Mobility status PT Visit Diagnosis: Unsteadiness on feet (R26.81);History of falling (Z91.81);Pain Pain - Right/Left:  (abdomen)    Time: 6644-0347 PT Time Calculation (min) (ACUTE ONLY): 28 min   Charges:   PT Evaluation $PT Eval Low Complexity: 1 Low PT Treatments $Gait Training: 8-22 mins PT General Charges $$ ACUTE PT VISIT: 1 Visit         Johnny Bridge, PT Acute Rehab   Jacqualyn Posey 06/09/2023, 3:52 PM

## 2023-06-09 NOTE — H&P (Signed)
06/09/2023  [name] [name] [id number] 16-Apr-1946  CARE TEAM: PCP: Corky Downs, MD  Outpatient Care Team: Patient Care Team: Corky Downs, MD as PCP - General (Internal Medicine) Mariah Milling, Tollie Pizza, MD as PCP - Cardiology (Cardiology) Mariah Milling Tollie Pizza, MD as Consulting Physician (Cardiology) Benita Gutter, RN as Oncology Nurse Navigator Orlie Dakin, Tollie Pizza, MD as Consulting Physician (Oncology) [name], MD as Consulting Physician (Colon and Rectal Surgery)  Inpatient Treatment Team: Treatment Team:  [name], MD Vassie Loll, RN Rickards, Alfonso Ramus, RN Elbashir, Elaina Hoops, NT [facility], Md, MD   Problem List:   Principal Problem:   Dehydration Active Problems:   Rectal adenocarcinoma (HCC)   Hypertension associated with diabetes (HCC)   Back pain   Type 2 diabetes mellitus with diabetic polyneuropathy, without long-term current use of insulin (HCC)   Irritant contact dermatitis associated with fecal stoma   Ileostomy in place [facility])   AKI (acute kidney injury) (HCC)   High output ileostomy (HCC)   CKD stage 3b, GFR 30-44 ml/min (HCC)   Nausea and vomiting 05/06/2023   POST-OPERATIVE DIAGNOSIS:   RECTAL CANCER COLON OBSTRUCTION DUE TO RECTAL CANCER S/P DIVERTING LOOP COLOSTOMY   PROCEDURE:   LOW ANTERIOR RECTOSIGMOID RESECTION TAKEDOWN OF LOOP COLOSTOMY WITH ANASTOMOSIS MOBILIZATION OF SPLENIC FLEXURE OF COLON DIVERTING LOOP ILEOSTOMY INTRAOPERATIVE ASSESSMENT OF TISSUE VASCULAR PERFUSION USING ICG (indocyanine green) IMMUNOFLUORESCENCE FLEXIBLE SIGMOIDOSCOPY   SURGEON:  Ardeth Sportsman, MD   OR FINDINGS:    Patient had thickened stellate scarring at peritoneal fraction at the junction tween the proximal and mid rectum.  Seem to have a decent treatment effect.  No obvious carcinomatosis.  No obvious metastatic disease on visceral parietal peritoneum or liver.  Need for loop colostomy takedown and splenic flexure mobilization to get reach.   It is a  29mm EEA anastomosis ( distal descending colon  connected to  mid/distal rectal junction .)  It rests 7 cm from the anal verge by rigid proctoscopy.   Protective diverting loop ileostomy created.  Positioned at old left upper quadrant colostomy site with some fascial and skin closure/revision.  Proximal end is cephalad.     ##################################################################   Preoperative diagnosis:  Rectal mass Postoperative diagnosis:  Same   Procedure: Cystoscopy Instillation of ureteral firefly constrast   Surgeon: Crist Fat, MD   Intraoperative findings: large capacity bladder, orthotopic ureteral orifice ease, no significant other abnormality.   ##################################################################   PATHOLOGY   FINAL MICROSCOPIC DIAGNOSIS:  A. COLOSTOMY: Colostomy. Negative for malignancy.  B. RECTOSIGMOID, RESECTION: Invasive adenocarcinoma, moderately differentiated associated with extensive fibrosis. Carcinoma extends into perirectal connective tissue. All surgical margins negative for carcinoma. Ten lymph nodes negative for metastatic carcinoma (0/10). Pathologic Stage Classification (pTNM, AJCC 8th Edition): ypT3, ypN0  C. FINAL DISTAL MARGIN: Benign colon. Distal margin negative for carcinoma.        Assessment [facility] Stay = 1 days)      Improving     Plan:  -Soft diet.  Calorie counts -antidiarrheal regimen w soluble fiber BID, iron BID, loperamide BID (&PRN) -Wean IVFluids. -ileostomy care with dermatitis - hopefully will be able easier to manage once diarrhea under control -Work to set up IV fluid boluses at home  3 times a week.  2L LR or NS until ileostomy takedown in September.   -DM control - SSI & glimepride only for now.  D/c glucophage -VTE prophylaxis- SCDs, etc -mobilize as tolerated to help recovery  -Disposition:  Disposition:  The patient  is from: Home Anticipate discharge to:   Home with Home Health Anticipated Date of Discharge is:  July 25,2024   Barriers to discharge:  Transitions of Care, Therapy assessment & Recommendations pending, and Pending Clinical improvement (more likely than not)  Patient currently is NOT MEDICALLY STABLE for discharge from the hospital from a surgery standpoint.      I reviewed nursing notes, last 24 h vitals and pain scores, last 48 h intake and output, last 24 h labs and trends, and last 24 h imaging results.  I have reviewed this patient's available data, including medical history, events of note, test results, etc as part of my evaluation.   A significant portion of that time was spent in counseling. Care during the described time interval was provided by me.  This care required moderate level of medical decision making.  06/09/2023    Subjective: (Chief complaint)  Feels better No more emesis - nausea/cramping less  Objective:  Vital signs:  Vitals:   06/08/23 1300 06/08/23 1538 06/08/23 2234 06/09/23 0514  BP:  (!) 114/59 105/66 101/65  Pulse:  60 (!) 59 (!) 54  Resp:  16 16 16   Temp:  (!) 97.5 F (36.4 C) 97.7 F (36.5 C) 97.9 F (36.6 C)  TempSrc:      SpO2:  100% 100% 100%  Weight: 73 kg     Height: 6' (1.829 m)       Last BM Date : 06/08/23  Intake/Output   Yesterday:  07/23 0701 - 07/24 0700 In: 3129 [I.V.:3129] Out: -  This shift:  Total I/O In: 3129 [I.V.:3129] Out: -   Bowel function:  Flatus: YES  BM:  YES  Drain: (No drain)   Physical Exam:  General: Pt awake/alert in no acute distress.  Smiling.  Thin but not toxic/cachetic  Eyes: PERRL, normal EOM.  Sclera clear.  No icterus Neuro: CN II-XII intact w/o focal sensory/motor deficits. Lymph: No head/neck/groin lymphadenopathy Psych:  No delerium/psychosis/paranoia.  Oriented x 4 HENT: Normocephalic, Mucus membranes moist.  No thrush Neck: Supple, No tracheal deviation.  No obvious thyromegaly Chest: No pain to chest wall  compression.  Good respiratory excursion.  No audible wheezing CV:  Pulses intact.  Regular rhythm.  No major extremity edema MS: Normal AROM mjr joints.  No obvious deformity  Abdomen: Soft.  Nondistended.  Mildly tender at incisions only.  No evidence of peritonitis.  No incarcerated hernias.  Ext:   No deformity.  No mjr edema.  No cyanosis Skin: No petechiae / purpurea.  No major sores.  Warm and dry    Results:   Cultures: No results found for this or any previous visit (from the past 720 hour(s)).  Labs: Results for orders placed or performed during the hospital encounter of 06/08/23 (from the past 48 hour(s))  Glucose, capillary     Status: Abnormal   Collection Time: 06/08/23 11:55 AM  Result Value Ref Range   Glucose-Capillary 164 (H) 70 - 99 mg/dL    Comment: Glucose reference range applies only to samples taken after fasting for at least 8 hours.  Glucose, capillary     Status: Abnormal   Collection Time: 06/08/23  9:35 PM  Result Value Ref Range   Glucose-Capillary 111 (H) 70 - 99 mg/dL    Comment: Glucose reference range applies only to samples taken after fasting for at least 8 hours.   Comment 1 Notify RN   Basic metabolic panel     Status: Abnormal  Collection Time: 06/09/23  1:45 AM  Result Value Ref Range   Sodium 134 (L) 135 - 145 mmol/L   Potassium 3.8 3.5 - 5.1 mmol/L   Chloride 100 98 - 111 mmol/L   CO2 23 22 - 32 mmol/L   Glucose, Bld 105 (H) 70 - 99 mg/dL    Comment: Glucose reference range applies only to samples taken after fasting for at least 8 hours.   BUN 79 (H) 8 - 23 mg/dL   Creatinine, Ser 1.61 (H) 0.61 - 1.24 mg/dL   Calcium 8.6 (L) 8.9 - 10.3 mg/dL   GFR, Estimated 31 (L) >60 mL/min    Comment: (NOTE) Calculated using the CKD-EPI Creatinine Equation (2021)    Anion gap 11 5 - 15    Comment: Performed at [facility], [address], [place], [place] [address]  CBC     Status: Abnormal   Collection Time: 06/09/23   1:45 AM  Result Value Ref Range   WBC 6.1 4.0 - 10.5 K/uL   RBC 3.03 (L) 4.22 - 5.81 MIL/uL   Hemoglobin 9.7 (L) 13.0 - 17.0 g/dL   HCT 54.0 (L) 98.1 - 19.1 %   MCV 93.7 80.0 - 100.0 fL   MCH 32.0 26.0 - 34.0 pg   MCHC 34.2 30.0 - 36.0 g/dL   RDW 47.8 29.5 - 62.1 %   Platelets 117 (L) 150 - 400 K/uL   nRBC 0.0 0.0 - 0.2 %    Comment: Performed at [facility], [address] [place], [place] [address]    Imaging / Studies: [place] EKG SITE RITE  Result Date: 06/08/2023 If Site Rite image not attached, placement could not be confirmed due to current cardiac rhythm.   Medications / Allergies: per chart  Antibiotics: Anti-infectives (From admission, onward)    None         Note: Portions of this report may have been transcribed using voice recognition software. Every effort was made to ensure accuracy; however, inadvertent computerized transcription errors may be present.   Any transcriptional errors that result from this process are unintentional.    Ardeth Sportsman, MD, FACS, MASCRS Esophageal, Gastrointestinal & Colorectal Surgery Robotic and Minimally Invasive Surgery  [facility] [address] [place], [place] [address] [phone] Fax [phone] Main  CONTACT INFORMATION: Weekday (9AM-5PM): Call [facility] main office at [phone] Weeknight (5PM-9AM) or Weekend/Holiday: Check EPIC "Web Links" tab & use "AMION" (password " [email or web address]") for General Surgery [facility] coverage  Please, DO NOT use SecureChat  (it is not reliable communication to reach operating surgeons & will lead to a delay in care).   Epic staff messaging available for outptient concerns needing 1-2 business day response.      06/09/2023  6:48 AM

## 2023-06-09 NOTE — Progress Notes (Signed)
Initial Nutrition Assessment  DOCUMENTATION CODES:   Severe malnutrition in context of chronic illness  INTERVENTION:  - 48 hour calorie count to start today (7/24) and run through tomorrow (7/25). - Please document % intake of all foods, drinks, and nutrition supplements patient consumes on meal tickets and place in envelope on patient's door.  - If patient skips/refuses meals please document 0% for that meal.  - Soft diet per MD.  - Glucerna Shake po BID, each supplement provides 220 kcal and 10 grams of protein  - Ileostomy diet education with handout provided to patient.   - Continue Multivitamin with minerals daily to support micronutrient needs.   - Monitor weight trends.   NUTRITION DIAGNOSIS:   Severe Malnutrition related to chronic illness as evidenced by severe fat depletion, severe muscle depletion, percent weight loss (12.5% in 1.5 months).  GOAL:   Patient will meet greater than or equal to 90% of their needs  MONITOR:   PO intake, Supplement acceptance, Diet advancement, Labs, Weight trends  REASON FOR ASSESSMENT:   Consult Diet education, Calorie Count  ASSESSMENT:   77 y.o. male with PMH rectal cancer who underwent rectosigmoid resection with takedown of loop colostomy and creation of diverting loop ileostomy on 6/20. Presented for post-op appointment and noted to be having difficulty with ileostomy diarrhea, admitted with dehydration.   Patient reports a UBW of 180-185# and that his weight has been fluctuating up and down for a year. However, reports since his surgery on 6/20 he has not done well and continued to lose weight.  Per EMR, patient weighed at 183# on 6/12 and now weighed at 160#. This is a 23# or 12.5% weight loss in 1.5 months, which is significant and severe for the time frame.   He reports that since his surgery he has been eating very little secondary to stomach pain, N/V, and diarrhea. Admits he was often afraid to eat in the fear he would  vomit afterwards. Appetite has also been decreased. He has been drinking Boost at home.   Thankfully, he reports he is doing better now and his appetite is returning. He endorses eating all of his breakfast and all his lunch, which is confirmed via RN documentation in flowsheets. Did not receive a Glucerna this morning due to supplement not being available but one sitting at bedside table during visit this afternoon. Patient agreeable to try during visit so RD opened for patient and he enjoyed it, agreeable to continue receiving.   RD consulted for nutrition education regarding new ileostomy. RD provided "Ileostomy Nutrition Therapy" handout from the Academy of Nutrition and Dietetics. Discussed different tips for new ileostomy such as small and frequent meals, consuming adequate fluid intake spaced away from meal times, and eating slowly and chewing food well. Patient notes he doesn't have many teeth so has to choose foods he can chew well. RD discussed food recommended as well as foods not recommended after surgery. Reviewed foods that can cause odor, gas, blockages, and diarrhea. RD discussed why it is important for patient to adhere to diet recommendations. Teach back method used. Patient appreciative of information.   Plan for a calorie count per Surgery. Calorie count to start today, hung calorie count envelope on door.    Medications reviewed and include: 325mg  ferrous sulfate, MVI, Fibercon BID, Imodium, Insulin  Labs reviewed:  Na 134 Creatinine 2.14 HA1C 7.2 Blood Glucose 85-164 x24 hours   NUTRITION - FOCUSED PHYSICAL EXAM:  Flowsheet Row Most Recent Value  Orbital Region Severe depletion  Upper Arm Region Moderate depletion  Thoracic and Lumbar Region Severe depletion  Buccal Region Severe depletion  Temple Region Severe depletion  Clavicle Bone Region Severe depletion  Clavicle and Acromion Bone Region Severe depletion  Scapular Bone Region Unable to assess  Dorsal Hand  Severe depletion  Patellar Region Moderate depletion  Anterior Thigh Region Moderate depletion  Posterior Calf Region Severe depletion  Edema (RD Assessment) None  Hair Reviewed  Eyes Reviewed  Mouth Reviewed  Skin Reviewed  Nails Reviewed       Diet Order:   Diet Order             DIET SOFT Room service appropriate? Yes; Fluid consistency: Thin  Diet effective now                   EDUCATION NEEDS:  Education needs have been addressed  Skin:  Skin Assessment: Reviewed RN Assessment  Last BM:  7/24 - ostomy  Height:  Ht Readings from Last 1 Encounters:  06/08/23 6' (1.829 m)   Weight:  Wt Readings from Last 1 Encounters:  06/08/23 73 kg   BMI:  Body mass index is 21.83 kg/m.  Estimated Nutritional Needs:  Kcal:  1850-2050 kcals Protein:  95-115 grams Fluid:  >/= 1.8L    Shelle Iron RD, LDN For contact information, refer to [facility].

## 2023-06-09 NOTE — Consult Note (Signed)
  [facility] CM Inpatient Consult   [facility]) Accountable Care Organization (ACO) [facility] Liaison Note  06/09/2023  [name] 10-Sep-1946 [id number]  Location: [facility] RN Hospital Liaison screened the patient remotely at [facility].  Screened for less than 30 days readmission list.  Insurance: [facility] HMO Medicare   [name] [name] is a 77 y.o. male who is a Primary Care Patient of [place], Renda Rolls, MD. The patient was screened for 30 day readmission hospitalization with noted extreme risk score for unplanned readmission risk with 3 IP/ 0 ED in 6 months.  The patient was assessed for potential [facility]) Care Management service needs for post hospital transition for care coordination. Review of patient's electronic medical record reveals patient is active with Oncology team.  SDOH reviewed and no needs noted.  Plan: [facility] Liaison will continue to follow progress and disposition to asess for post hospital community care coordination/management needs.  Referral request for community care coordination: no needs per review noted.   [facility] Care Management/Population Health does not replace or interfere with any arrangements made by the Inpatient Transition of Care team.   For questions contact:   Charlesetta Shanks, RN BSN CCM [facility]  [phone] business mobile phone Toll free office [phone]  *Concierge Line  [phone] Fax number: [phone] [email or web address]

## 2023-06-09 NOTE — Plan of Care (Signed)
  Problem: Education: Goal: Knowledge of General Education information will improve Description: Including pain rating scale, medication(s)/side effects and non-pharmacologic comfort measures Outcome: Progressing   Problem: Health Behavior/Discharge Planning: Goal: Ability to manage health-related needs will improve Outcome: Progressing   Problem: Clinical Measurements: Goal: Ability to maintain clinical measurements within normal limits will improve Outcome: Progressing Goal: Will remain free from infection Outcome: Progressing Goal: Diagnostic test results will improve Outcome: Progressing Goal: Respiratory complications will improve Outcome: Progressing Goal: Cardiovascular complication will be avoided Outcome: Progressing   Problem: Activity: Goal: Risk for activity intolerance will decrease Outcome: Progressing   Problem: Nutrition: Goal: Adequate nutrition will be maintained Outcome: Progressing   Problem: Coping: Goal: Level of anxiety will decrease Outcome: Progressing   Problem: Elimination: Goal: Will not experience complications related to bowel motility Outcome: Progressing Goal: Will not experience complications related to urinary retention Outcome: Progressing   Problem: Pain Managment: Goal: General experience of comfort will improve Outcome: Progressing   Problem: Safety: Goal: Ability to remain free from injury will improve Outcome: Progressing   Problem: Skin Integrity: Goal: Risk for impaired skin integrity will decrease Outcome: Progressing   Problem: Education: Goal: Ability to describe self-care measures that may prevent or decrease complications (Diabetes Survival Skills Education) will improve Outcome: Progressing Goal: Individualized Educational Video(s) Outcome: Progressing   Problem: Coping: Goal: Ability to adjust to condition or change in health will improve Outcome: Progressing   Problem: Fluid Volume: Goal: Ability to  maintain a balanced intake and output will improve Outcome: Progressing   Problem: Health Behavior/Discharge Planning: Goal: Ability to identify and utilize available resources and services will improve Outcome: Progressing Goal: Ability to manage health-related needs will improve Outcome: Progressing   Problem: Metabolic: Goal: Ability to maintain appropriate glucose levels will improve Outcome: Progressing   Problem: Nutritional: Goal: Maintenance of adequate nutrition will improve Outcome: Progressing Goal: Progress toward achieving an optimal weight will improve Outcome: Progressing   Problem: Skin Integrity: Goal: Risk for impaired skin integrity will decrease Outcome: Progressing   Problem: Tissue Perfusion: Goal: Adequacy of tissue perfusion will improve Outcome: Progressing  Pt A/Ox4 on RA. IV LR fluids running at 19ml/hr. Pt was complaining of abd cramping relieved by imodium.

## 2023-06-10 DIAGNOSIS — E43 Unspecified severe protein-calorie malnutrition: Secondary | ICD-10-CM | POA: Insufficient documentation

## 2023-06-10 LAB — BASIC METABOLIC PANEL
Anion gap: 9 (ref 5–15)
CO2: 25 mmol/L (ref 22–32)
Chloride: 101 mmol/L (ref 98–111)
Creatinine, Ser: 1.04 mg/dL (ref 0.61–1.24)
GFR, Estimated: >60 mL/min (ref >60)
Glucose, Bld: 112 mg/dL — ABNORMAL HIGH (ref 70–99)
Sodium: 135 mmol/L (ref 135–145)

## 2023-06-10 LAB — GLUCOSE, CAPILLARY
Glucose-Capillary: 102 mg/dL — ABNORMAL HIGH (ref 70–99)
Glucose-Capillary: 160 mg/dL — ABNORMAL HIGH (ref 70–99)

## 2023-06-10 LAB — HEMOGLOBIN: Hemoglobin: 9.1 g/dL — ABNORMAL LOW (ref 13.0–17.0)

## 2023-06-10 MED ORDER — FERROUS SULFATE 325 (65 FE) MG PO TABS: 325.0000 mg | ORAL_TABLET | Freq: Two times a day (BID) | ORAL | 3 refills | Status: DC

## 2023-06-10 MED ORDER — LACTATED RINGERS IV BOLUS: 1000.0000 mL | Freq: Three times a day (TID) | INTRAVENOUS | Status: DC | PRN

## 2023-06-10 MED ORDER — LACTATED RINGERS IV SOLN: 2000.0000 mL | INTRAVENOUS | 21 refills | Status: AC

## 2023-06-10 MED ORDER — LOPERAMIDE HCL 2 MG PO CAPS: 2.0000 mg | ORAL_CAPSULE | Freq: Two times a day (BID) | ORAL | 0 refills | Status: DC

## 2023-06-10 MED ORDER — ONDANSETRON 4 MG PO TBDP: 4.0000 mg | ORAL_TABLET | Freq: Three times a day (TID) | ORAL | 10 refills | Status: AC | PRN

## 2023-06-10 MED ORDER — ENOXAPARIN SODIUM 40 MG/0.4ML IJ SOSY: 40.0000 mg | PREFILLED_SYRINGE | INTRAMUSCULAR | Status: DC

## 2023-06-10 MED ORDER — LACTATED RINGERS IV BOLUS
2000.0000 mL | Freq: Once | INTRAVENOUS | Status: AC
  Administered 2023-06-10: 2000 mL via INTRAVENOUS

## 2023-06-10 MED ORDER — CALCIUM POLYCARBOPHIL 625 MG PO TABS: 625.0000 mg | ORAL_TABLET | Freq: Two times a day (BID) | ORAL | 2 refills | Status: DC

## 2023-06-10 MED ORDER — LOPERAMIDE HCL 2 MG PO CAPS: 2.0000 mg | ORAL_CAPSULE | Freq: Four times a day (QID) | ORAL | 5 refills | Status: DC | PRN

## 2023-06-10 NOTE — TOC Transition Note (Addendum)
Transition of Care [facility]) - CM/SW Discharge Note   Patient Details  Name: [name] [name] MRN: [id number] Date of Birth: [birth year]  [facility]) CM/SW Contact:  Adrian Prows, RN Phone Number: 06/10/2023, 9:15 AM   Clinical Narrative:    D/C orders received; [facility] services previously arranged w/ [facility] and [facility]; notified Cory at [place] and Pam at agencies; no TOC needs.  [id number] clarified w/ Dr Michaell Cowing end date for home IVF is 6 weeks; Pam at [employer] and [place] at [place] notified.  Final next level of care: Home w Hospice Care Barriers to Discharge: No Barriers Identified   Patient Goals and CMS Choice      Discharge Placement                         Discharge Plan and Services Additional resources added to the After Visit Summary for                                       Social Determinants of Health (SDOH) Interventions SDOH Screenings   Food Insecurity: No Food Insecurity (06/08/2023)  Housing: Low Risk  (06/08/2023)  Transportation Needs: No Transportation Needs (06/08/2023)  Utilities: Not At Risk (06/08/2023)  Tobacco Use: High Risk (06/08/2023)     Readmission Risk Interventions    06/09/2023   10:05 AM  Readmission Risk Prevention Plan  Transportation Screening Complete  Medication Review (RN Care Manager) Complete  PCP or Specialist appointment within 3-5 days of discharge Complete  HRI or Home Care Consult Complete  SW Recovery Care/Counseling Consult Complete  Palliative Care Screening Not Applicable  Skilled Nursing Facility Not Applicable

## 2023-06-10 NOTE — Plan of Care (Signed)
  Problem: Education: Goal: Knowledge of General Education information will improve Description: Including pain rating scale, medication(s)/side effects and non-pharmacologic comfort measures Outcome: Progressing   Problem: Health Behavior/Discharge Planning: Goal: Ability to manage health-related needs will improve Outcome: Progressing   Problem: Clinical Measurements: Goal: Ability to maintain clinical measurements within normal limits will improve Outcome: Progressing Goal: Will remain free from infection Outcome: Progressing Goal: Diagnostic test results will improve Outcome: Progressing Goal: Respiratory complications will improve Outcome: Progressing Goal: Cardiovascular complication will be avoided Outcome: Progressing   Problem: Activity: Goal: Risk for activity intolerance will decrease Outcome: Progressing   Problem: Nutrition: Goal: Adequate nutrition will be maintained Outcome: Progressing   Problem: Coping: Goal: Level of anxiety will decrease Outcome: Progressing   Problem: Elimination: Goal: Will not experience complications related to bowel motility Outcome: Progressing Goal: Will not experience complications related to urinary retention Outcome: Progressing   Problem: Pain Managment: Goal: General experience of comfort will improve Outcome: Progressing   Problem: Safety: Goal: Ability to remain free from injury will improve Outcome: Progressing   Problem: Skin Integrity: Goal: Risk for impaired skin integrity will decrease Outcome: Progressing   Problem: Education: Goal: Ability to describe self-care measures that may prevent or decrease complications (Diabetes Survival Skills Education) will improve Outcome: Progressing Goal: Individualized Educational Video(s) Outcome: Progressing   Problem: Coping: Goal: Ability to adjust to condition or change in health will improve Outcome: Progressing   Problem: Fluid Volume: Goal: Ability to  maintain a balanced intake and output will improve Outcome: Progressing   Problem: Health Behavior/Discharge Planning: Goal: Ability to identify and utilize available resources and services will improve Outcome: Progressing Goal: Ability to manage health-related needs will improve Outcome: Progressing   Problem: Metabolic: Goal: Ability to maintain appropriate glucose levels will improve Outcome: Progressing   Problem: Nutritional: Goal: Maintenance of adequate nutrition will improve Outcome: Progressing Goal: Progress toward achieving an optimal weight will improve Outcome: Progressing   Problem: Skin Integrity: Goal: Risk for impaired skin integrity will decrease Outcome: Progressing   Problem: Tissue Perfusion: Goal: Adequacy of tissue perfusion will improve Outcome: Progressing  Pt A/Ox4, up to bathroom mod independent. IVF running at 81ml/hr.

## 2023-06-10 NOTE — Progress Notes (Signed)
Calorie Count Note  48 hour calorie count ordered.  Diet: Heart Healthy  Supplements: Glucerna BID  Day 1: Breakfast: 100% Lunch: 100% Dinner: 100% Supplements: 1 Glucerna  Total intake: 2006 kcal (100% of minimum estimated needs)  61 protein (64% of minimum estimated needs)  Plans/Intervention:  Patient ate 100% of all meals yesterday and drinking supplements. Plan for patient to discharge home today.   Shelle Iron RD, LDN For contact information, refer to [facility].

## 2023-06-10 NOTE — Discharge Summary (Addendum)
Physician Discharge Summary    Patient ID: [name] [name] MRN: [id number] DOB/AGE: 02-19-1946  77 y.o.  Patient Care Team: Corky Downs, MD as PCP - General (Internal Medicine) Antonieta Iba, MD as PCP - Cardiology (Cardiology) Antonieta Iba, MD as Consulting Physician (Cardiology) Benita Gutter, RN as Oncology Nurse Navigator Orlie Dakin, Tollie Pizza, MD as Consulting Physician (Oncology) [name], MD as Consulting Physician (Colon and Rectal Surgery)  Admit date: 06/08/2023  Discharge date: 06/10/2023  Hospital Stay = 2 days    Discharge Diagnoses:  Principal Problem:   Dehydration Active Problems:   Rectal adenocarcinoma (HCC)   Hypertension associated with diabetes (HCC)   Back pain   Type 2 diabetes mellitus with diabetic polyneuropathy, without long-term current use of insulin (HCC)   Irritant contact dermatitis associated with fecal stoma   Ileostomy in place [facility])   AKI (acute kidney injury) (HCC)   High output ileostomy (HCC)   CKD stage 3b, GFR 30-44 ml/min (HCC)   Nausea and vomiting   Anemia of chronic disease   Protein-calorie malnutrition, severe      * No surgery found *  POST-OPERATIVE DIAGNOSIS:   * No surgery found *  SURGERY:  * No surgery found *    SURGEON:    * Surgery not found *  Consults: Case Management / Social Work, Administrator, arts, Nutrition, and Wound ostomy [facility]  Hospital Course:   Patient requiring anterior rectosigmoid resection with takedown of diverting transverse colostomy and creating a new ileostomy.  Struggling with nausea vomiting and can get dehydrated.  AKI.  Aggressive IV fluids given.  Aggressive nausea control flank.  No evidence of infection.  Ileostomy care done.  Ileostomy regimen with fiber/laxative/Imodium.  Patient advanced diet as tolerated. Creatinine normalized.  The patient gradually mobilized and advanced to a solid diet.  Pain and other symptoms were treated aggressively.     By the time of discharge, the patient was walking well the hallways, eating food, having flatus.  Pain was well-controlled on an oral medications.  Based on meeting discharge criteria and continuing to recover, I felt it was safe for the patient to be discharged from the hospital to further recover with close followup.  Home Health IVF boluses 3x/week for 6 weeks.  Postoperative recommendations were discussed in detail.  They are written as well.  Discharged Condition: good  Discharge Exam: Blood pressure 116/62, pulse (!) 52, temperature 97.7 F (36.5 C), resp. rate 12, height 6' (1.829 m), weight 73 kg, SpO2 100%.  General: Pt awake/alert/oriented x4 in No acute distress Eyes: PERRL, normal EOM.  Sclera clear.  No icterus Neuro: CN II-XII intact w/o focal sensory/motor deficits. Lymph: No head/neck/groin lymphadenopathy Psych:  No delerium/psychosis/paranoia HENT: Normocephalic, Mucus membranes moist.  No thrush Neck: Supple, No tracheal deviation Chest:  No chest wall pain w good excursion CV:  Pulses intact.  Regular rhythm MS: Normal AROM mjr joints.  No obvious deformity Abdomen: Soft.  Nondistended.  Nontender. Ileostomy pink w oatmeal thick succus in bag.  No leak/dermatitis.   No evidence of peritonitis.  No incarcerated hernias. Ext:  SCDs BLE.  No mjr edema.  No cyanosis Skin: No petechiae / purpura   Disposition:    Follow-up Information     Care, [facility] Follow up.   Specialty: Home Health Services Why: Home Health Nurse for IV fluids and Ileostomy care/teaching Home Health Physical Therapy Contact information: [address] [place] [address] [phone]  [name], MD. Schedule an appointment as soon as possible for a visit in 3 week(s).   Specialties: General Surgery, Colon and Rectal Surgery Why: To follow up after your hospital stay Contact information: [address] [place]  [address] [phone]                 Discharge disposition: 01-Home or Self Care       Discharge Instructions     Call MD for:   Complete by: As directed    FEVER > 101.5 F  (temperatures < 101.5 F are not significant)   Call MD for:  extreme fatigue   Complete by: As directed    Call MD for:  persistant dizziness or light-headedness   Complete by: As directed    Call MD for:  persistant nausea and vomiting   Complete by: As directed    Call MD for:  redness, tenderness, or signs of infection (pain, swelling, redness, odor or green/yellow discharge around incision site)   Complete by: As directed    Call MD for:  severe uncontrolled pain   Complete by: As directed    Diet - low sodium heart healthy   Complete by: As directed    Start with a bland diet such as soups, liquids, starchy foods, low fat foods, etc. the first few days at home. Gradually advance to a solid, low-fat, high fiber diet by the end of the first week at home.   Add a fiber supplement to your diet (Metamucil, etc) If you feel full, bloated, or constipated, stay on a full liquid or pureed/blenderized diet for a few days until you feel better and are no longer constipated.   Discharge instructions   Complete by: As directed    See Discharge Instructions If you are not getting better after two weeks or are noticing you are getting worse, contact our office [phone] for further advice.  We may need to adjust your medications, re-evaluate you in the office, send you to the emergency room, or see what other things we can do to help. The clinic staff is available to answer your questions during regular business hours (8:30am-5pm).  Please don't hesitate to call and ask to speak to one of our nurses for clinical concerns.    A surgeon from [facility] is always on call at the hospitals 24 hours/day If you have a medical emergency, go to the nearest emergency room or call 911.   Driving  Restrictions   Complete by: As directed    You may drive when: - you are no longer taking narcotic prescription pain medication - you can comfortably wear a seatbelt - you can safely make sudden turns/stops without pain.   Increase activity slowly   Complete by: As directed    Start light daily activities --- self-care, walking, climbing stairs- beginning the day after surgery.  Gradually increase activities as tolerated.  Control your pain to be active.  Stop when you are tired.  Ideally, walk several times a day, eventually an hour a day.   Most people are back to most day-to-day activities in a few weeks.  It takes 4-6 weeks to get back to unrestricted, intense activity. If you can walk 30 minutes without difficulty, it is safe to try more intense activity such as jogging, treadmill, bicycling, low-impact aerobics, swimming, etc. Save the most intensive and strenuous activity for last (Usually 4-8 weeks after surgery) such as sit-ups, heavy lifting,  contact sports, etc.  Refrain from any intense heavy lifting or straining until you are off narcotics for pain control.  You will have off days, but things should improve week-by-week. DO NOT PUSH THROUGH PAIN.  Let pain be your guide: If it hurts to do something, don't do it.   Lifting restrictions   Complete by: As directed    If you can walk 30 minutes without difficulty, it is safe to try more intense activity such as jogging, treadmill, bicycling, low-impact aerobics, swimming, etc. Save the most intensive and strenuous activity for last (Usually 4-8 weeks after surgery) such as sit-ups, heavy lifting, contact sports, etc.   Refrain from any intense heavy lifting or straining until you are off narcotics for pain control.  You will have off days, but things should improve week-by-week. DO NOT PUSH THROUGH PAIN.  Let pain be your guide: If it hurts to do something, don't do it.  Pain is your body warning you to avoid that activity for another week  until the pain goes down.   May shower / Bathe   Complete by: As directed    May walk up steps   Complete by: As directed    Sexual Activity Restrictions   Complete by: As directed    You may have sexual intercourse when it is comfortable. If it hurts to do something, stop.       Allergies as of 06/10/2023   No Known Allergies      Medication List     TAKE these medications    aspirin EC 81 MG tablet Take 81 mg by mouth daily.   atorvastatin 40 MG tablet Commonly known as: LIPITOR Take 1 tablet (40 mg total) by mouth daily.   diphenoxylate-atropine 2.5-0.025 MG tablet Commonly known as: LOMOTIL Take 1 tablet by mouth 2 (two) times daily.   famotidine 40 MG tablet Commonly known as: PEPCID Take 1 tablet (40 mg total) by mouth daily.   ferrous sulfate 325 (65 FE) MG tablet Take 1 tablet (325 mg total) by mouth 2 (two) times daily with a meal.   glimepiride 4 MG tablet Commonly known as: AMARYL Take 4 mg by mouth daily with breakfast.   isosorbide mononitrate 30 MG 24 hr tablet Commonly known as: IMDUR Take 30 mg by mouth daily.   lactated ringers infusion Inject 2,000 mLs into the vein every Monday, Wednesday, and Friday. Start taking on: June 11, 2023   loperamide 2 MG capsule Commonly known as: IMODIUM Take 1-2 capsules (2-4 mg total) by mouth every 6 (six) hours as needed for diarrhea or loose stools (Use if >2 BM every 8 hours).   loperamide 2 MG capsule Commonly known as: IMODIUM Take 1 capsule (2 mg total) by mouth 2 (two) times daily.   nitroGLYCERIN 0.4 MG SL tablet Commonly known as: NITROSTAT Place 1 tablet (0.4 mg total) under the tongue every 5 (five) minutes as needed for chest pain.   ondansetron 4 MG disintegrating tablet Commonly known as: ZOFRAN-ODT Take 1 tablet (4 mg total) by mouth every 8 (eight) hours as needed for up to 7 days for nausea.   pioglitazone 15 MG tablet Commonly known as: ACTOS Take 15 mg by mouth daily.    polycarbophil 625 MG tablet Commonly known as: FIBERCON Take 1 tablet (625 mg total) by mouth 2 (two) times daily.   prochlorperazine 10 MG tablet Commonly known as: COMPAZINE Take 10 mg by mouth every 6 (six) hours as needed for nausea  or vomiting.         Significant Diagnostic Studies:  Results for orders placed or performed during the hospital encounter of 06/08/23 (from the past 72 hour(s))  Glucose, capillary     Status: Abnormal   Collection Time: 06/08/23 11:55 AM  Result Value Ref Range   Glucose-Capillary 164 (H) 70 - 99 mg/dL    Comment: Glucose reference range applies only to samples taken after fasting for at least 8 hours.  Glucose, capillary     Status: Abnormal   Collection Time: 06/08/23  9:35 PM  Result Value Ref Range   Glucose-Capillary 111 (H) 70 - 99 mg/dL    Comment: Glucose reference range applies only to samples taken after fasting for at least 8 hours.   Comment 1 Notify RN   Basic metabolic panel     Status: Abnormal   Collection Time: 06/09/23  1:45 AM  Result Value Ref Range   Sodium 134 (L) 135 - 145 mmol/L   Potassium 3.8 3.5 - 5.1 mmol/L   Chloride 100 98 - 111 mmol/L   CO2 23 22 - 32 mmol/L   Glucose, Bld 105 (H) 70 - 99 mg/dL    Comment: Glucose reference range applies only to samples taken after fasting for at least 8 hours.   BUN 79 (H) 8 - 23 mg/dL   Creatinine, Ser 2.84 (H) 0.61 - 1.24 mg/dL   Calcium 8.6 (L) 8.9 - 10.3 mg/dL   GFR, Estimated 31 (L) >60 mL/min    Comment: (NOTE) Calculated using the CKD-EPI Creatinine Equation (2021)    Anion gap 11 5 - 15    Comment: Performed at [facility] [address], [place], [place] [address]  CBC     Status: Abnormal   Collection Time: 06/09/23  1:45 AM  Result Value Ref Range   WBC 6.1 4.0 - 10.5 K/uL   RBC 3.03 (L) 4.22 - 5.81 MIL/uL   Hemoglobin 9.7 (L) 13.0 - 17.0 g/dL   HCT 01.0 (L) 27.2 - 53.6 %   MCV 93.7 80.0 - 100.0 fL   MCH 32.0 26.0 - 34.0 pg   MCHC  34.2 30.0 - 36.0 g/dL   RDW 64.4 03.4 - 74.2 %   Platelets 117 (L) 150 - 400 K/uL   nRBC 0.0 0.0 - 0.2 %    Comment: Performed at [facility], [address], [place], [place] [address]  Phosphorus     Status: None   Collection Time: 06/09/23  1:45 AM  Result Value Ref Range   Phosphorus 4.3 2.5 - 4.6 mg/dL    Comment: Performed at [facility], [address], [place], [place] [address]  Magnesium     Status: None   Collection Time: 06/09/23  1:45 AM  Result Value Ref Range   Magnesium 1.9 1.7 - 2.4 mg/dL    Comment: Performed at [facility], [address] [place], [place] [address]  Glucose, capillary     Status: Abnormal   Collection Time: 06/09/23  8:11 AM  Result Value Ref Range   Glucose-Capillary 109 (H) 70 - 99 mg/dL    Comment: Glucose reference range applies only to samples taken after fasting for at least 8 hours.  Vitamin B12     Status: Abnormal   Collection Time: 06/09/23 10:29 AM  Result Value Ref Range   Vitamin B-12 2,089 (H) 180 - 914 pg/mL    Comment: RESULT CONFIRMED BY MANUAL  DILUTION (NOTE) This assay is not validated for testing neonatal or myeloproliferative syndrome specimens for Vitamin B12 levels. Performed at [facility], [address] [place], [place] [address]   Folate     Status: None   Collection Time: 06/09/23 10:29 AM  Result Value Ref Range   Folate 21.8 >5.9 ng/mL    Comment: Performed at [facility], [address], [place], [place] [address]  Iron and TIBC     Status: None   Collection Time: 06/09/23 10:29 AM  Result Value Ref Range   Iron 91 45 - 182 ug/dL   TIBC 098 119 - 147 ug/dL   Saturation Ratios 31 17.9 - 39.5 %   UIBC 204 ug/dL    Comment: Performed at [facility], [address] [place], [place] [address]  Ferritin     Status: None   Collection Time: 06/09/23 10:29 AM  Result Value Ref Range    Ferritin 100 24 - 336 ng/mL    Comment: Performed at [facility], [address] [place], [place] [address]  Reticulocytes     Status: Abnormal   Collection Time: 06/09/23 10:29 AM  Result Value Ref Range   Retic Ct Pct 0.9 0.4 - 3.1 %   RBC. 2.92 (L) 4.22 - 5.81 MIL/uL   Retic Count, Absolute 26.3 19.0 - 186.0 K/uL   Immature Retic Fract 3.8 2.3 - 15.9 %    Comment: Performed at [facility], [address] [place], [place] [address]  Lipase, blood     Status: Abnormal   Collection Time: 06/09/23 10:29 AM  Result Value Ref Range   Lipase 52 (H) 11 - 51 U/L    Comment: Performed at [facility], [address] [place], [place] [address]  Hepatic function panel     Status: Abnormal   Collection Time: 06/09/23 10:29 AM  Result Value Ref Range   Total Protein 5.5 (L) 6.5 - 8.1 g/dL   Albumin 2.9 (L) 3.5 - 5.0 g/dL   AST 19 15 - 41 U/L   ALT 20 0 - 44 U/L   Alkaline Phosphatase 70 38 - 126 U/L   Total Bilirubin 0.7 0.3 - 1.2 mg/dL   Bilirubin, Direct <5.2 0.0 - 0.2 mg/dL   Indirect Bilirubin NOT CALCULATED 0.3 - 0.9 mg/dL    Comment: Performed at [facility], [address] [place] [address] [place], [place] [address]  Prealbumin     Status: None   Collection Time: 06/09/23 10:29 AM  Result Value Ref Range   Prealbumin 25 18 - 38 mg/dL    Comment: Performed at [facility], [address], [place], [place] [address]  Glucose, capillary     Status: None   Collection Time: 06/09/23 12:09 PM  Result Value Ref Range   Glucose-Capillary 85 70 - 99 mg/dL    Comment: Glucose reference range applies only to samples taken after fasting for at least 8 hours.  Glucose, capillary     Status: Abnormal   Collection Time: 06/09/23  4:47 PM  Result Value Ref Range   Glucose-Capillary 111 (H) 70 - 99 mg/dL    Comment: Glucose reference range applies only to samples taken after fasting for at least 8 hours.  Glucose, capillary      Status: Abnormal   Collection Time: 06/09/23 10:21 PM  Result Value Ref Range   Glucose-Capillary 139 (H) 70 - 99 mg/dL  Comment: Glucose reference range applies only to samples taken after fasting for at least 8 hours.   Comment 1 Notify RN    Comment 2 Document in Chart   Hemoglobin     Status: Abnormal   Collection Time: 06/10/23  3:28 AM  Result Value Ref Range   Hemoglobin 9.1 (L) 13.0 - 17.0 g/dL    Comment: Performed at [facility], [address] [place], [place] [address]  Basic metabolic panel     Status: Abnormal   Collection Time: 06/10/23  3:28 AM  Result Value Ref Range   Sodium 135 135 - 145 mmol/L   Potassium 3.8 3.5 - 5.1 mmol/L   Chloride 101 98 - 111 mmol/L   CO2 25 22 - 32 mmol/L   Glucose, Bld 112 (H) 70 - 99 mg/dL    Comment: Glucose reference range applies only to samples taken after fasting for at least 8 hours.   BUN 47 (H) 8 - 23 mg/dL   Creatinine, Ser 1.91 0.61 - 1.24 mg/dL    Comment: DELTA CHECK NOTED   Calcium 8.5 (L) 8.9 - 10.3 mg/dL   GFR, Estimated >47 >82 mL/min    Comment: (NOTE) Calculated using the CKD-EPI Creatinine Equation (2021)    Anion gap 9 5 - 15    Comment: Performed at [facility], [address], [place], [place] [address]  Glucose, capillary     Status: Abnormal   Collection Time: 06/10/23  8:10 AM  Result Value Ref Range   Glucose-Capillary 102 (H) 70 - 99 mg/dL    Comment: Glucose reference range applies only to samples taken after fasting for at least 8 hours.    [place] EKG SITE RITE  Result Date: 06/08/2023 If [facility] image not attached, placement could not be confirmed due to current cardiac rhythm.   Past Medical History:  Diagnosis Date   Anemia    Anginal pain (HCC)    Bradycardia    Cancer (HCC)    Coronary artery disease    x 1 stent   Depression    Diabetes mellitus without complication (HCC)    GERD (gastroesophageal reflux disease)    History of kidney  stones    Hyperlipidemia    Hypertension    Nephrolithiasis    Partial Large bowel obstruction (HCC)     Past Surgical History:  Procedure Laterality Date   CARDIAC CATHETERIZATION     CATARACT EXTRACTION     COLONOSCOPY WITH PROPOFOL N/A 05/05/2023   Procedure: COLONOSCOPY WITH PROPOFOL;  Surgeon: Regis Bill, MD;  Location: [facility] ENDOSCOPY;  Service: Endoscopy;  Laterality: N/A;  Patient having surgery tomorrow, 05/06/2023. Ok'd per Echo and office   CORONARY ANGIOPLASTY     EYE SURGERY     FLEXIBLE SIGMOIDOSCOPY N/A 07/27/2022   Procedure: FLEXIBLE SIGMOIDOSCOPY;  Surgeon: Regis Bill, MD;  Location: [facility] ENDOSCOPY;  Service: Endoscopy;  Laterality: N/A;   KIDNEY STONE SURGERY     NECK SURGERY  12/18/2011   nephrolithiasis     PORTACATH PLACEMENT Right 08/24/2022   Procedure: INSERTION PORT-A-CATH;  Surgeon: Campbell Lerner, MD;  Location: [facility] ORS;  Service: General;  Laterality: Right;   stent (other)     TRANSVERSE LOOP COLOSTOMY N/A 07/28/2022   Procedure: TRANSVERSE LOOP COLOSTOMY;  Surgeon: Campbell Lerner, MD;  Location: [facility] ORS;  Service: General;  Laterality: N/A;   XI ROBOTIC ASSISTED LOWER ANTERIOR RESECTION N/A 05/06/2023   Procedure: XI ROBOTIC ASSISTED LOWER ANTERIOR  RECTOSIGMOID RESECTION, TAKEDOWN OF OLD LOOP COLOSTOMY WITH ANASTOMOSIS, MOBILIZATION OF SPLENIC FLEXURE OF COLON, AND NEW DIVERTING LOOP ILEOSTOMY, INTRAOPERATIVE ASSESSMENT OF TISSUE VASCULAR PERFUSION USING ICG (INDOCYANINE GREEN) IMMUNOFLUORESCENCE, FLEXIBLE SIGMOIDOSCOPY;  Surgeon: [name], MD;  Location: [facility] ORS;  Service: General;  Lateralit    Social History   Socioeconomic History   Marital status: Married    Spouse name: Doris   Number of children: Not on file   Years of education: Not on file   Highest education level: Not on file  Occupational History   Not on file  Tobacco Use   Smoking status: Every Day    Current packs/day: 0.25    Average packs/day: 0.3  packs/day for 36.0 years (9.0 ttl pk-yrs)    Types: Cigarettes   Smokeless tobacco: Never   Tobacco comments:    Smokes 6 cigarettes daily   Vaping Use   Vaping status: Never Used  Substance and Sexual Activity   Alcohol use: No   Drug use: No   Sexual activity: Not on file  Other Topics Concern   Not on file  Social History Narrative   Not on file   Social Determinants of Health   Financial Resource Strain: Not on file  Food Insecurity: No Food Insecurity (06/08/2023)   Hunger Vital Sign    Worried About Running Out of Food in the Last Year: Never true    Ran Out of Food in the Last Year: Never true  Transportation Needs: No Transportation Needs (06/08/2023)   PRAPARE - Administrator, Civil Service (Medical): No    Lack of Transportation (Non-Medical): No  Physical Activity: Not on file  Stress: Not on file  Social Connections: Not on file  Intimate Partner Violence: Not At Risk (06/08/2023)   Humiliation, Afraid, Rape, and Kick questionnaire    Fear of Current or Ex-Partner: No    Emotionally Abused: No    Physically Abused: No    Sexually Abused: No    Family History  Problem Relation Age of Onset   Heart disease Father     Current Facility-Administered Medications  Medication Dose Route Frequency Provider Last Rate Last Admin   alum & mag hydroxide-simeth (MAALOX/MYLANTA) 200-200-20 MG/5ML suspension 30 mL  30 mL Oral Q6H PRN [name], MD       aspirin EC tablet 81 mg  81 mg Oral Daily [name], MD   81 mg at 06/10/23 1005   atorvastatin (LIPITOR) tablet 40 mg  40 mg Oral Daily [name], MD   40 mg at 06/10/23 1004   Chlorhexidine Gluconate Cloth 2 % PADS 6 each  6 each Topical Q6578 [name], MD   6 each at 06/10/23 0503   diphenhydrAMINE (BENADRYL) 12.5 MG/5ML elixir 12.5 mg  12.5 mg Oral Q6H PRN [name], MD       Or   diphenhydrAMINE (BENADRYL) injection 12.5 mg  12.5 mg Intravenous Q6H PRN [name], MD        enoxaparin (LOVENOX) injection 40 mg  40 mg Subcutaneous Q24H Bell, Michelle T, RPH       famotidine (PEPCID) tablet 40 mg  40 mg Oral Daily [name], MD   40 mg at 06/10/23 1003   feeding supplement (GLUCERNA SHAKE) (GLUCERNA SHAKE) liquid 237 mL  237 mL Oral BID BM [name], MD   237 mL at 06/10/23 1006   ferrous sulfate tablet 325 mg  325 mg Oral BID WC [name], MD  325 mg at 06/10/23 1004   glimepiride (AMARYL) tablet 4 mg  4 mg Oral Q breakfast [name], MD   4 mg at 06/10/23 1004   HYDROmorphone (DILAUDID) injection 0.5-2 mg  0.5-2 mg Intravenous Q2H PRN [name], MD       insulin aspart (novoLOG) injection 0-15 Units  0-15 Units Subcutaneous TID WC [name], MD       insulin aspart (novoLOG) injection 0-5 Units  0-5 Units Subcutaneous QHS [name], MD       lactated ringers bolus 1,000 mL  1,000 mL Intravenous Q8H PRN [name], MD       lactated ringers infusion   Intravenous Continuous [name], MD 50 mL/hr at 06/09/23 1654 New Bag at 06/09/23 1654   lidocaine (XYLOCAINE) 2 % jelly 1 Application  1 Application Topical Q8H PRN [name], MD       loperamide (IMODIUM) capsule 2 mg  2 mg Oral BID [name], MD   2 mg at 06/10/23 1004   loperamide (IMODIUM) capsule 2-4 mg  2-4 mg Oral Q6H PRN [name], MD       magic mouthwash  15 mL Oral QID PRN [name], MD       menthol-cetylpyridinium (CEPACOL) lozenge 3 mg  1 lozenge Oral PRN [name], MD       methocarbamol (ROBAXIN) 1,000 mg in dextrose 5 % 100 mL IVPB  1,000 mg Intravenous Q6H PRN [name], MD       metoprolol tartrate (LOPRESSOR) injection 5 mg  5 mg Intravenous Q6H PRN [name], MD       multivitamins with iron tablet 1 tablet  1 tablet Oral Daily [name], MD   1 tablet at 06/10/23 1003   nitroGLYCERIN (NITROSTAT) SL tablet 0.4 mg  0.4 mg Sublingual Q5 min PRN [name], MD       ondansetron [facility]) injection 4 mg  4 mg Intravenous Q6H PRN  [name], MD       Or   ondansetron (ZOFRAN) 8 mg in sodium chloride 0.9 % 50 mL IVPB  8 mg Intravenous Q6H PRN [name], MD       ondansetron (ZOFRAN-ODT) disintegrating tablet 4 mg  4 mg Oral Q6H PRN [name], MD       Or   ondansetron [facility]) injection 4 mg  4 mg Intravenous Q6H PRN [name], MD       phenol (CHLORASEPTIC) mouth spray 2 spray  2 spray Mouth/Throat PRN [name], MD       polycarbophil (FIBERCON) tablet 625 mg  625 mg Oral BID [name], MD   625 mg at 06/10/23 1004   prochlorperazine (COMPAZINE) injection 5-10 mg  5-10 mg Intravenous Q4H PRN [name], MD       simethicone (MYLICON) 40 MG/0.6ML suspension 80 mg  80 mg Oral QID PRN [name], MD       traMADol Janean Sark) tablet 50-100 mg  50-100 mg Oral Q6H PRN [name], MD         No Known Allergies  Signed:   Ardeth Sportsman, MD, FACS, MASCRS Esophageal, Gastrointestinal & Colorectal Surgery Robotic and Minimally Invasive Surgery  [facility] [address], [address] [place], [place] [address] [phone] Fax [phone] Main  CONTACT INFORMATION: Weekday (9AM-5PM): Call [facility] main office at [phone] Weeknight (5PM-9AM) or Weekend/Holiday: Check EPIC "Web Links" tab & use "AMION" (password " [email or web address]") for  General Surgery [facility] coverage  Please, DO NOT use SecureChat  (it is not reliable communication to reach operating surgeons & will lead to a delay in care).   Epic staff messaging available for outptient concerns needing 1-2 business day response.      06/10/2023, 10:56 AM

## 2023-06-10 NOTE — Discharge Instructions (Addendum)
#######################################################  Ostomy Support Information  You've heard that people get along just fine with only one of their eyes, or one of their lungs, or one of their kidneys. But you also know that you have only one intestine and only one bladder, and that leaves you feeling awfully empty, both physically and emotionally: You think no other people go around without part of their intestine with the ends of their intestines sticking out through their abdominal walls.   YOU ARE NOT ALONE.  There are nearly three quarters of a million people in the [place] who have an ostomy; people who have had surgery to remove all or part of their colons or bladders.   There is even a national association, the [place] [employer] [place] with over 350 local affiliated support groups that are organized by volunteers who provide peer support and counseling. [name] [name] has a toll free telephone num-ber, [phone] and an educational, interactive website, www.ostomy.org   An ostomy is an opening in the belly (abdominal wall) made by surgery. Ostomates are people who have had this procedure. The opening (stoma) allows the kidney or bowel to grdischarge waste. An external pouch covers the stoma to collect waste. Pouches are are a simple bag and are odor free. Different companies have disposable or reusable pouches to fit one's lifestyle. An ostomy can either be temporary or permanent.   THERE ARE THREE MAIN TYPES OF OSTOMIES Colostomy. A colostomy is a surgically created opening in the large intestine (colon). Ileostomy. An ileostomy is a surgically created opening in the small intestine. Urostomy. A urostomy is a surgically created opening to divert urine away from the bladder.  OSTOMY Care  The following guidelines will make care of your colostomy easier. Keep this information close by for quick reference.  Helpful DIET hints Eat a well-balanced diet including vegetables and fresh  fruits. Eat on a regular schedule.  Drink at least 6 to 8 glasses of fluids daily. Eat slowly in a relaxed atmosphere. Chew your food thoroughly. Avoid chewing gum, smoking, and drinking from a straw. This will help decrease the amount of air you swallow, which may help reduce gas. Eating yogurt or drinking buttermilk may help reduce gas.  To control gas at night, do not eat after 8 p.m. This will give your bowel time to quiet down before you go to bed.  If gas is a problem, you can purchase Beano. Sprinkle Beano on the first bite of food before eating to reduce gas. It has no flavor and should not change the taste of your food. You can buy Beano over the counter at your local drugstore.  Foods like fish, onions, garlic, broccoli, asparagus, and cabbage produce odor. Although your pouch is odor-proof, if you eat these foods you may notice a stronger odor when emptying your pouch. If this is a concern, you may want to limit these foods in your diet.  If you have an ileostomy, you will have chronic diarrhea & need to drink more liquids to avoid getting dehydrated.  Consider antidiarrheal medicine like imodium (loperamide) or Lomotil to help slow down bowel movements / diarrhea into your ileostomy bag.  GETTING TO GOOD BOWEL HEALTH WITH AN ILEOSTOMY    With the colon bypassed & not in use, you will have small bowel diarrhea.   It is important to thicken & slow your bowel movements down.   The goal: 4-6 small BOWEL MOVEMENTS A DAY It is important to drink plenty of liquids to avoid  getting dehydrated  CONTROLLING ILEOSTOMY DIARRHEA  TAKE A FIBER SUPPLEMENT (FiberCon or Benefiner soluble fiber) twice a day - to thicken stools by absorbing excess fluid and retrain the intestines to act more normally.  Slowly increase the dose over a few weeks.  Too much fiber too soon can backfire and cause cramping & bloating.  TAKE AN IRON SUPPLEMENT twice a day to naturally constipate your bowels.  Usually  ferrous sulfate 325mg  twice a day)  TAKE ANTI-DIARRHEAL MEDICINES: Loperamide (Imodium) can slow down diarrhea.  Start with two tablets (= 4mg ) first and then try one tablet every 6 hours.  Can go up to 2 pills four times day (8 pills of 2mg  max) Avoid if you are having fevers or severe pain.  If you are not better or start feeling worse, stop all medicines and call your doctor for advice LoMotil (Diphenoxylate / Atropine) is another medicine that can constipate & slow down bowel moevements Pepto Bismol (bismuth) can gently thicken bowels as well  If diarrhea is worse,: drink plenty of liquids and try simpler foods for a few days to avoid stressing your intestines further. Avoid dairy products (especially milk & ice cream) for a short time.  The intestines often can lose the ability to digest lactose when stressed. Avoid foods that cause gassiness or bloating.  Typical foods include beans and other legumes, cabbage, broccoli, and dairy foods.  Every person has some sensitivity to other foods, so listen to our body and avoid those foods that trigger problems for you.Call your doctor if you are getting worse or not better.  Sometimes further testing (cultures, endoscopy, X-ray studies, bloodwork, etc) may be needed to help diagnose and treat the cause of the diarrhea. Take extra anti-diarrheal medicines (maximum is 8 pills of 2mg  loperamide a day)   Tips for POUCHING an OSTOMY   Changing Your Pouch The best time to change your pouch is in the morning, before eating or drinking anything. Your stoma can function at any time, but it will function more after eating or drinking.   Applying the pouching system  Place all your equipment close at hand before removing your pouch.  Wash your hands.  Stand or sit in front of a mirror. Use the position that works best for you. Remember that you must keep the skin around the stoma wrinkle-free for a good seal.  Gently remove the used pouch (1-piece  system) or the pouch and old wafer (2-piece system). Empty the pouch into the toilet. Save the closure clip to use again.  Wash the stoma itself and the skin around the stoma. Your stoma may bleed a little when being washed. This is normal. Rinse and pat dry. You may use a wash cloth or soft paper towels (like Bounty), mild soap (like Dial, Safeguard, or [place]), and water. Avoid soaps that contain perfumes or lotions.  For a new pouch (1-piece system) or a new wafer (2-piece system), measure your stoma using the stoma guide in each box of supplies.  Trace the shape of your stoma onto the back of the new pouch or the back of the new wafer. Cut out the opening. Remove the paper backing and set it aside.  Optional: Apply a skin barrier powder to surrounding skin if it is irritated (bare or weeping), and dust off the excess. Optional: Apply a skin-prep wipe (such as Skin Prep or All-Kare) to the skin around the stoma, and let it dry. Do not apply this solution if the  skin is irritated (red, tender, or broken) or if you have shaved around the stoma. Optional: Apply a skin barrier paste (such as Stomahesive, Coloplast, or Premium) around the opening cut in the back of the pouch or wafer. Allow it to dry for 30 to 60 seconds.  Hold the pouch (1-piece system) or wafer (2-piece system) with the sticky side toward your body. Make sure the skin around the stoma is wrinkle-free. Center the opening on the stoma, then press firmly to your abdomen (Fig. 4). Look in the mirror to check if you are placing the pouch, or wafer, in the right position. For a 2-piece system, snap the pouch onto the wafer. Make sure it snaps into place securely.  Place your hand over the stoma and the pouch or wafer for about 30 seconds. The heat from your hand can help the pouch or wafer stick to your skin.  Add deodorant (such as Super Banish or Nullo) to your pouch. Other options include food extracts such as vanilla oil and peppermint  extract. Add about 10 drops of the deodorant to the pouch. Then apply the closure clamp. Note: Do not use toxic  chemicals or commercial cleaning agents in your pouch. These substances may harm the stoma.  Optional: For extra seal, apply tape to all 4 sides around the pouch or wafer, as if you were framing a picture. You may use any brand of medical adhesive tape. Change your pouch every 5 to 7 days. Change it immediately if a leak occurs.  Wash your hands afterwards.  If you are wearing a 2-piece system, you may use 2 new pouches per week and alternate them. Rinse the pouch with mild soap and warm water and hang it to dry for the next day. Apply the fresh pouch. Alternate the 2 pouches like this for a week. After a week, change the wafer and begin with 2 new pouches. Place the old pouches in a plastic bag, and put them in the trash.   LIVING WITH AN OSTOMY  Emptying Your Pouch Empty your pouch when it is one-third full (of urine, stool, and/or gas). If you wait until your pouch is fuller than this, it will be more difficult to empty and more noticeable. When you empty your pouch, either put toilet paper in the toilet bowl first, or flush the toilet while you empty the pouch. This will reduce splashing. You can empty the pouch between your legs or to one side while sitting, or while standing or stooping. If you have a 2-piece system, you can snap off the pouch to empty it. Remember that your stoma may function during this time. If you wish to rinse your pouch after you empty it, a [place] baster can be helpful. When using a baster, squirt water up into the pouch through the opening at the bottom. With a 2-piece system, you can snap off the pouch to rinse it. After rinsing  your pouch, empty it into the toilet. When rinsing your pouch at home, put a few granules of Dreft soap in the rinse water. This helps lubricate and freshen your pouch. The inside of your pouch can be sprayed with non-stick cooking  oil (Pam spray). This may help reduce stool sticking to the inside of the pouch.  Bathing You may shower or bathe with your pouch on or off. Remember that your stoma may function during this time.  The materials you use to wash your stoma and the skin around it should be  clean, but they do not need to be sterile.  Wearing Your Pouch During hot weather, or if you perspire a lot in general, wear a cover over your pouch. This may prevent a rash on your skin under the pouch. Pouch covers are sold at ostomy supply stores. Wear the pouch inside your underwear for better support. Watch your weight. Any gain or loss of 10 to 15 pounds or more can change the way your pouch fits.  Going Away From Home A collapsible cup (like those that come in travel kits) or a soft plastic squirt bottle with a pull-up top (like a travel bottle for shampoo) can be used for rinsing your pouch when you are away from home. Tilt the opening of the pouch at an upward angle when using a cup to rinse.  Carry wet wipes or extra tissues to use in public bathrooms.  Carry an extra pouching system with you at all times.  Never keep ostomy supplies in the glove compartment of your car. Extreme heat or cold can damage the skin barriers and adhesive wafers on the pouch.  When you travel, carry your ostomy supplies with you at all times. Keep them within easy reach. Do not pack ostomy supplies in baggage that will be checked or otherwise separated from you, because your baggage might be lost. If you're traveling out of the country, it is helpful to have a letter stating that you are carrying ostomy supplies as a medical necessity.  If you need ostomy supplies while traveling, look in the yellow pages of the telephone book under "Surgical Supplies." Or call the local ostomy organization to find out where supplies are available.  Do not let your ostomy supplies get low. Always order new pouches before you use the last one.  Reducing  Odor Limit foods such as broccoli, cabbage, onions, fish, and garlic in your diet to help reduce odor. Each time you empty your pouch, carefully clean the opening of the pouch, both inside and outside, with toilet paper. Rinse your pouch 1 or 2 times daily after you empty it (see directions for emptying your pouch and going away from home). Add deodorant (such as Super Banish or Nullo) to your pouch. Use air deodorizers in your bathroom. Do not add aspirin to your pouch. Even though aspirin can help prevent odor, it could cause ulcers on your stoma.  When to call the doctor Call the doctor if you have any of the following symptoms: Purple, black, or white stoma Severe cramps lasting more than 6 hours Severe watery discharge from the stoma lasting more than 6 hours No output from the colostomy for 3 days Excessive bleeding from your stoma Swelling of your stoma to more than 1/2-inch larger than usual Pulling inward of your stoma below skin level Severe skin irritation or deep ulcers Bulging or other changes in your abdomen  When to call your ostomy nurse Call your ostomy/enterostomal therapy (WOCN) nurse if any of the following occurs: Frequent leaking of your pouching system Change in size or appearance of your stoma, causing discomfort or problems with your pouch Skin rash or rawness Weight gain or loss that causes problems with your pouch     FREQUENTLY ASKED QUESTIONS   Why haven't you met any of these folks who have an ostomy?  Well, maybe you have! You just did not recognize them because an ostomy doesn't show. It can be kept secret if you wish. Why, maybe some of your best friends, office  associates or neighbors have an ostomy ... you never can tell. People facing ostomy surgery have many quality-of-life questions like: Will you bulge? Smell? Make noises? Will you feel waste leaving your body? Will you be a captive of the toilet? Will you starve? Be a social outcast? Get/stay  married? Have babies? Easily bathe, go swimming, bend over?  OK, let's look at what you can expect:   Will you bulge?  Remember, without part of the intestine or bladder, and its contents, you should have a flatter tummy than before. You can expect to wear, with little exception, what you wore before surgery ... and this in-cludes tight clothing and bathing suits.   Will you smell?  Today, thanks to modern odor proof pouching systems, you can walk into an ostomy support group meeting and not smell anything that is foul or offensive. And, for those with an ileostomy or colostomy who are concerned about odor when emptying their pouch, there are in-pouch deodorants that can be used to eliminate any waste odors that may exist.   Will you make noises?  Everyone produces gas, especially if they are an air-swallower. But intestinal sounds that occur from time to time are no differ-ent than a gurgling tummy, and quite often your clothing will muffle any sounds.   Will you feel the waste discharges?  For those with a colostomy or ileostomy there might be a slight pressure when waste leaves your body, but understand that the intestines have no nerve endings, so there will be no unpleasant sensations. Those with a urostomy will probably be unaware of any kidney drainage.   Will you be a captive of the toilet?  Immediately post-op you will spend more time in the bathroom than you will after your body recovers from surgery. Every person is different, but on average those with an ileostomy or urostomy may empty their pouches 4 to 6 times a day; a little  less if you have a colostomy. The average wear time between pouch system changes is 3 to 5 days and the changing process should take less than 30 minutes.   Will I need to be on a special diet? Most people return to their normal diet when they have recovered from surgery. Be sure to chew your food well, eat a well-balanced diet and drink plenty of fluids. If  you experience problems with a certain food, wait a couple of weeks and try it again.  Will there be odor and noises? Pouching systems are designed to be odor-proof or odor-resistant. There are deodorants that can be used in the pouch. Medications are also available to help reduce odor. Limit gas-producing foods and carbonated beverages. You will experience less gas and fewer noises as you heal from surgery.  How much time will it take to care for my ostomy? At first, you may spend a lot of time learning about your ostomy and how to take care of it. As you become more comfortable and skilled at changing the pouching system, it will take very little time to care for it.   Will I be able to return to work? People with ostomies can perform most jobs. As soon as you have healed from surgery, you should be able to return to work. Heavy lifting (more than 10 pounds) may be discouraged.   What about intimacy? Sexual relationships and intimacy are important and fulfilling aspects of your life. They should continue after ostomy surgery. Intimacy-related concerns should be discussed openly between you and  your partner.   Can I wear regular clothing? You do not need to wear special clothing. Ostomy pouches are fairly flat and barely noticeable. Elastic undergarments will not hurt the stoma or prevent the ostomy from functioning.   Can I participate in sports? An ostomy should not limit your involvement in sports. Many people with ostomies are runners, skiers, swimmers or participate in other active lifestyles. Talk with your caregiver first before doing heavy physical activity.  Will you starve?  Not if you follow doctor's orders at each stage of your post-op adjustment. There is no such thing as an "ostomy diet". Some people with an ostomy will be able to eat and tolerate anything; others may find diffi-culty with some foods. Each person is an individual and must determine, by trial, what is best for  them. A good practice for all is to drink plenty of water.   Will you be a social outcast?  Have you met anyone who has an ostomy and is a social outcast? Why should you be the first? Only your attitude and self image will effect how you are treated. No confi-dent person is an Investment banker, corporate.    PROFESSIONAL HELP   Resources are available if you need help or have questions about your ostomy.   Specially trained nurses called Wound, Ostomy Continence Nurses (WOCN) are available for consultation in most major medical centers.  Consider getting an ostomy consult at an outpatient ostomy clinic.   [place] has an Ostomy Clinic run by an Chartered certified accountant at the [facility] campus.  [phone]. [facility] can help set up an appointment   The [employer] (UOA) is a group made up of many local chapters throughout the [place]. These local groups hold meetings and provide support to prospective and existing ostomates. They sponsor educational events and have qualified visitors to make personal or telephone visits. Contact the UOA for the chapter nearest you and for other educational publications.  More detailed information can be found in Colostomy Guide, a publication of the [employer] (UOA). Contact UOA at 1-770 536 3624 or visit their web site at YellowSpecialist.at. The website contains links to other sites, suppliers and resources.  Media planner Start Services: Start at the website to enlist for support.  Your Wound Ostomy (WOCN) nurse may have started this process. https://www.hollister.com/en/securestart Secure Start services are designed to support people as they live their lives with an ostomy or neurogenic bladder. Enrolling is easy and at no cost to the patient. We realize that each person's needs and life journey are different. Through Secure Start services, we want to help people live their life, their  way.  #######################################################     CONTROLLING DIARRHEA  TAKE A FIBER SUPPLEMENT (FiberCon or Benefiner soluble fiber) twice a day - to thicken stools by absorbing excess fluid and retrain the intestines to act more normally.  Slowly increase the dose over a few weeks.  Too much fiber too soon can backfire and cause cramping & bloating.  TAKE AN IRON SUPPLEMENT twice a day to naturally constipate your bowels.  Usually ferrous sulfate 325mg  twice a day)  TAKE ANTI-DIARRHEAL MEDICINES: Loperamide (Imodium) can slow down diarrhea.  Start with two tablets (= 4mg ) first and then try one tablet every 6 hours.  Can go up to 2 pills four times day (8 pills of 2mg  max) Avoid if you are having fevers or severe pain.  If you are not better or start feeling worse, stop  all medicines and call your doctor for advice LoMotil (Diphenoxylate / Atropine) is another medicine that can constipate & slow down bowel moevements Pepto Bismol (bismuth) can gently thicken bowels as well  If diarrhea is worse,: drink plenty of liquids and try simpler foods for a few days to avoid stressing your intestines further. Avoid dairy products (especially milk & ice cream) for a short time.  The intestines often can lose the ability to digest lactose when stressed. Avoid foods that cause gassiness or bloating.  Typical foods include beans and other legumes, cabbage, broccoli, and dairy foods.  Every person has some sensitivity to other foods, so listen to our body and avoid those foods that trigger problems for you.Call your doctor if you are getting worse or not better.  Sometimes further testing (cultures, endoscopy, X-ray studies, bloodwork, etc) may be needed to help diagnose and treat the cause of the diarrhea. Take extra anti-diarrheal medicines (maximum is 8 pills of 2mg  loperamide a day)  TROUBLESHOOTING IRREGULAR BOWELS 1) Avoid extremes of bowel movements (no bad  constipation/diarrhea) 2) Miralax 17gm mixed in 8oz. water or juice-daily. May use BID as needed.  3) Gas-x,Phazyme, etc. as needed for gas & bloating.  4) Soft,bland diet. No spicy,greasy,fried foods.  5) Prilosec over-the-counter as needed  6) May hold gluten/wheat products from diet to see if symptoms improve.  7)  May try probiotics (Align, Activa, etc) to help calm the bowels down 7) If symptoms become worse call back immediately.

## 2023-06-11 ENCOUNTER — Ambulatory Visit (HOSPITAL_COMMUNITY)
Admission: RE | Admit: 2023-06-11 | Discharge: 2023-06-11 | Disposition: A | Discharge status: Home / Self Care | Payer: Medicare HMO | Source: Ambulatory Visit | Attending: Nurse Practitioner | Admitting: Nurse Practitioner

## 2023-06-11 DIAGNOSIS — L249 Irritant contact dermatitis, unspecified cause: Secondary | ICD-10-CM

## 2023-06-11 DIAGNOSIS — K9419 Other complications of enterostomy: Secondary | ICD-10-CM | POA: Diagnosis not present

## 2023-06-11 DIAGNOSIS — L258 Unspecified contact dermatitis due to other agents: Secondary | ICD-10-CM | POA: Diagnosis not present

## 2023-06-11 DIAGNOSIS — E860 Dehydration: Secondary | ICD-10-CM | POA: Diagnosis not present

## 2023-06-11 DIAGNOSIS — Z932 Ileostomy status: Secondary | ICD-10-CM | POA: Diagnosis not present

## 2023-06-11 DIAGNOSIS — Z432 Encounter for attention to ileostomy: Secondary | ICD-10-CM | POA: Insufficient documentation

## 2023-06-11 DIAGNOSIS — N179 Acute kidney failure, unspecified: Secondary | ICD-10-CM | POA: Diagnosis not present

## 2023-06-11 DIAGNOSIS — R198 Other specified symptoms and signs involving the digestive system and abdomen: Secondary | ICD-10-CM

## 2023-06-11 NOTE — Discharge Instructions (Signed)
FOr [facility] nurse:  Clean skin with soap and water and pat dry.   Place 1/2  barrier ring in umbilicus and [place]akin seal around stoma or barrier ring CUt barrier opening 1 1/2"  Apply barrier strips around barrier Wear ostomy belt at all times except shower

## 2023-06-11 NOTE — Progress Notes (Signed)
[facility]   Reason for visit:  LUQ ileostomy in former colostomy site HPI:  Dehydration and acute kidney injury.  Will be receiving IV fluids three times weekly at home Past Medical History:  Diagnosis Date  . Anemia   . Anginal pain (HCC)   . Bradycardia   . Cancer (HCC)   . Coronary artery disease    x 1 stent  . Depression   . Diabetes mellitus without complication (HCC)   . GERD (gastroesophageal reflux disease)   . History of kidney stones   . Hyperlipidemia   . Hypertension   . Nephrolithiasis   . Partial Large bowel obstruction (HCC)    Family History  Problem Relation Age of Onset  . Heart disease Father    No Known Allergies Current Outpatient Medications  Medication Sig Dispense Refill Last Dose  . aspirin 81 MG EC tablet Take 81 mg by mouth daily.       Marland Kitchen atorvastatin (LIPITOR) 40 MG tablet Take 1 tablet (40 mg total) by mouth daily. 90 tablet 3   . diphenoxylate-atropine (LOMOTIL) 2.5-0.025 MG tablet Take 1 tablet by mouth 2 (two) times daily. 30 tablet 5   . famotidine (PEPCID) 40 MG tablet Take 1 tablet (40 mg total) by mouth daily. 90 tablet 3   . ferrous sulfate 325 (65 FE) MG tablet Take 1 tablet (325 mg total) by mouth 2 (two) times daily with a meal. 60 tablet 3   . glimepiride (AMARYL) 4 MG tablet Take 4 mg by mouth daily with breakfast.     . isosorbide mononitrate (IMDUR) 30 MG 24 hr tablet Take 30 mg by mouth daily.     Marland Kitchen lactated ringers infusion Inject 2,000 mLs into the vein every Monday, Wednesday, and Friday. 2000 mL 21   . loperamide (IMODIUM) 2 MG capsule Take 1-2 capsules (2-4 mg total) by mouth every 6 (six) hours as needed for diarrhea or loose stools (Use if >2 BM every 8 hours). 60 capsule 5   . loperamide (IMODIUM) 2 MG capsule Take 1 capsule (2 mg total) by mouth 2 (two) times daily. 30 capsule 0   . nitroGLYCERIN (NITROSTAT) 0.4 MG SL tablet Place 1 tablet (0.4 mg total) under the tongue every 5 (five) minutes as needed for  chest pain. 25 tablet 6   . ondansetron (ZOFRAN-ODT) 4 MG disintegrating tablet Take 1 tablet (4 mg total) by mouth every 8 (eight) hours as needed for up to 7 days for nausea. 10 tablet 10   . pioglitazone (ACTOS) 15 MG tablet Take 15 mg by mouth daily.     . polycarbophil (FIBERCON) 625 MG tablet Take 1 tablet (625 mg total) by mouth 2 (two) times daily. 60 tablet 2   . prochlorperazine (COMPAZINE) 10 MG tablet Take 10 mg by mouth every 6 (six) hours as needed for nausea or vomiting.      No current facility-administered medications for this encounter.   ROS  Review of Systems  Constitutional:  Positive for fatigue.  Gastrointestinal:        LUQ ileostomy  Skin:  Positive for color change and rash.       Medical adhesive related skin injury   Psychiatric/Behavioral:  The patient is nervous/anxious.   All other systems reviewed and are negative. Vital signs:  BP 128/63 (BP Location: Right Arm)   Pulse 63   Temp 98 F (36.7 C) (Oral)   Resp 20   SpO2 98%  Exam:  Physical Exam Vitals reviewed.  Constitutional:      Appearance: Normal appearance.  Abdominal:     Palpations: Abdomen is soft.  Musculoskeletal:     Comments: States he feels much stronger today.   Skin:    General: Skin is warm and dry.     Findings: Rash present.  Neurological:     Mental Status: He is alert and oriented to person, place, and time.  Psychiatric:        Mood and Affect: Mood normal.        Behavior: Behavior normal.    Stoma type/location:  LUQ ileostomy Stomal assessment/size:  1 1/2" pink and moist Peristomal assessment:  peristomal breakdown has resolved.  Pouch applied 06/08/23 in office remains intact.  When we change today, medical adhesive related skin injury noted to periphery of pouching area.  We had sealed with pink tape (zinc oxide backing, waterproof due to excessive leaks )  Resolved and we will use barrier strips instead today.  Treatment options for stomal/peristomal skin:  barrier ring to peristomal skin AND 1/2 ring in umbilicus to create flat pouching surface  ostomy belt.  Output: liquid effluent Ostomy pouching: 1pc. Convex with barrier ring, (1 1/2) barrier strips and belt. .  Education provided:  performed pouch change again.  Wife feels more comfortable with process.  She states she is administering IV fluids tomorrow via port.  She has watched videos and we discuss cleansing technique of the port.     Impression/dx  Contact dermatitis  medical adhesive related skin injury High output ileostomy dehydration Discussion  See back 2 weeks  Plan  Supplies provided  [facility] should be providing supplies.  Step by step instructions given to wife to show clinician    Visit time: 45 minutes.   Maple Hudson FNP-BC

## 2023-06-12 DIAGNOSIS — C20 Malignant neoplasm of rectum: Secondary | ICD-10-CM | POA: Diagnosis not present

## 2023-06-12 DIAGNOSIS — E860 Dehydration: Secondary | ICD-10-CM | POA: Diagnosis not present

## 2023-06-12 DIAGNOSIS — E1122 Type 2 diabetes mellitus with diabetic chronic kidney disease: Secondary | ICD-10-CM | POA: Diagnosis not present

## 2023-06-12 DIAGNOSIS — Z452 Encounter for adjustment and management of vascular access device: Secondary | ICD-10-CM | POA: Diagnosis not present

## 2023-06-12 DIAGNOSIS — N179 Acute kidney failure, unspecified: Secondary | ICD-10-CM | POA: Diagnosis not present

## 2023-06-12 DIAGNOSIS — E1159 Type 2 diabetes mellitus with other circulatory complications: Secondary | ICD-10-CM | POA: Diagnosis not present

## 2023-06-12 DIAGNOSIS — N1832 Chronic kidney disease, stage 3b: Secondary | ICD-10-CM | POA: Diagnosis not present

## 2023-06-12 DIAGNOSIS — I2510 Atherosclerotic heart disease of native coronary artery without angina pectoris: Secondary | ICD-10-CM | POA: Diagnosis not present

## 2023-06-12 DIAGNOSIS — I152 Hypertension secondary to endocrine disorders: Secondary | ICD-10-CM | POA: Diagnosis not present

## 2023-06-15 DIAGNOSIS — E1159 Type 2 diabetes mellitus with other circulatory complications: Secondary | ICD-10-CM | POA: Diagnosis not present

## 2023-06-15 DIAGNOSIS — I2510 Atherosclerotic heart disease of native coronary artery without angina pectoris: Secondary | ICD-10-CM | POA: Diagnosis not present

## 2023-06-15 DIAGNOSIS — N179 Acute kidney failure, unspecified: Secondary | ICD-10-CM | POA: Diagnosis not present

## 2023-06-15 DIAGNOSIS — E1122 Type 2 diabetes mellitus with diabetic chronic kidney disease: Secondary | ICD-10-CM | POA: Diagnosis not present

## 2023-06-15 DIAGNOSIS — N1832 Chronic kidney disease, stage 3b: Secondary | ICD-10-CM | POA: Diagnosis not present

## 2023-06-15 DIAGNOSIS — Z452 Encounter for adjustment and management of vascular access device: Secondary | ICD-10-CM | POA: Diagnosis not present

## 2023-06-15 DIAGNOSIS — C20 Malignant neoplasm of rectum: Secondary | ICD-10-CM | POA: Diagnosis not present

## 2023-06-15 DIAGNOSIS — E860 Dehydration: Secondary | ICD-10-CM | POA: Diagnosis not present

## 2023-06-15 DIAGNOSIS — I152 Hypertension secondary to endocrine disorders: Secondary | ICD-10-CM | POA: Diagnosis not present

## 2023-06-19 DIAGNOSIS — C20 Malignant neoplasm of rectum: Secondary | ICD-10-CM | POA: Diagnosis not present

## 2023-06-19 DIAGNOSIS — N179 Acute kidney failure, unspecified: Secondary | ICD-10-CM | POA: Diagnosis not present

## 2023-06-19 DIAGNOSIS — E1159 Type 2 diabetes mellitus with other circulatory complications: Secondary | ICD-10-CM | POA: Diagnosis not present

## 2023-06-19 DIAGNOSIS — I2510 Atherosclerotic heart disease of native coronary artery without angina pectoris: Secondary | ICD-10-CM | POA: Diagnosis not present

## 2023-06-19 DIAGNOSIS — I152 Hypertension secondary to endocrine disorders: Secondary | ICD-10-CM | POA: Diagnosis not present

## 2023-06-19 DIAGNOSIS — Z452 Encounter for adjustment and management of vascular access device: Secondary | ICD-10-CM | POA: Diagnosis not present

## 2023-06-19 DIAGNOSIS — N1832 Chronic kidney disease, stage 3b: Secondary | ICD-10-CM | POA: Diagnosis not present

## 2023-06-19 DIAGNOSIS — E1122 Type 2 diabetes mellitus with diabetic chronic kidney disease: Secondary | ICD-10-CM | POA: Diagnosis not present

## 2023-06-19 DIAGNOSIS — E860 Dehydration: Secondary | ICD-10-CM | POA: Diagnosis not present

## 2023-06-23 DIAGNOSIS — E860 Dehydration: Secondary | ICD-10-CM | POA: Diagnosis not present

## 2023-06-23 DIAGNOSIS — N179 Acute kidney failure, unspecified: Secondary | ICD-10-CM | POA: Diagnosis not present

## 2023-06-23 DIAGNOSIS — C20 Malignant neoplasm of rectum: Secondary | ICD-10-CM | POA: Diagnosis not present

## 2023-06-23 DIAGNOSIS — E1169 Type 2 diabetes mellitus with other specified complication: Secondary | ICD-10-CM | POA: Diagnosis not present

## 2023-06-23 DIAGNOSIS — Z452 Encounter for adjustment and management of vascular access device: Secondary | ICD-10-CM | POA: Diagnosis not present

## 2023-06-23 DIAGNOSIS — N1832 Chronic kidney disease, stage 3b: Secondary | ICD-10-CM | POA: Diagnosis not present

## 2023-06-23 DIAGNOSIS — E1122 Type 2 diabetes mellitus with diabetic chronic kidney disease: Secondary | ICD-10-CM | POA: Diagnosis not present

## 2023-06-23 DIAGNOSIS — E1159 Type 2 diabetes mellitus with other circulatory complications: Secondary | ICD-10-CM | POA: Diagnosis not present

## 2023-06-23 DIAGNOSIS — E1142 Type 2 diabetes mellitus with diabetic polyneuropathy: Secondary | ICD-10-CM | POA: Diagnosis not present

## 2023-06-23 DIAGNOSIS — I152 Hypertension secondary to endocrine disorders: Secondary | ICD-10-CM | POA: Diagnosis not present

## 2023-06-23 DIAGNOSIS — E785 Hyperlipidemia, unspecified: Secondary | ICD-10-CM | POA: Diagnosis not present

## 2023-06-23 DIAGNOSIS — I2510 Atherosclerotic heart disease of native coronary artery without angina pectoris: Secondary | ICD-10-CM | POA: Diagnosis not present

## 2023-06-25 ENCOUNTER — Ambulatory Visit (HOSPITAL_COMMUNITY)
Admission: RE | Admit: 2023-06-25 | Discharge: 2023-06-25 | Disposition: A | Discharge status: Home / Self Care | Payer: Medicare HMO | Source: Ambulatory Visit

## 2023-06-25 DIAGNOSIS — L24B3 Irritant contact dermatitis related to fecal or urinary stoma or fistula: Secondary | ICD-10-CM

## 2023-06-25 DIAGNOSIS — Z932 Ileostomy status: Secondary | ICD-10-CM

## 2023-06-25 DIAGNOSIS — Z432 Encounter for attention to ileostomy: Secondary | ICD-10-CM | POA: Insufficient documentation

## 2023-06-25 NOTE — Progress Notes (Signed)
[facility]   Reason for visit:  LUQ ileostomy in former colostomy site.  Wife administering IV hydration at home via port.  HPI:   Past Medical History:  Diagnosis Date   Anemia    Anginal pain (HCC)    Bradycardia    Cancer (HCC)    Coronary artery disease    x 1 stent   Depression    Diabetes mellitus without complication (HCC)    GERD (gastroesophageal reflux disease)    History of kidney stones    Hyperlipidemia    Hypertension    Nephrolithiasis    Partial Large bowel obstruction (HCC)    Family History  Problem Relation Age of Onset   Heart disease Father    No Known Allergies Current Outpatient Medications  Medication Sig Dispense Refill Last Dose   aspirin 81 MG EC tablet Take 81 mg by mouth daily.        atorvastatin (LIPITOR) 40 MG tablet Take 1 tablet (40 mg total) by mouth daily. 90 tablet 3    diphenoxylate-atropine (LOMOTIL) 2.5-0.025 MG tablet Take 1 tablet by mouth 2 (two) times daily. 30 tablet 5    famotidine (PEPCID) 40 MG tablet Take 1 tablet (40 mg total) by mouth daily. 90 tablet 3    ferrous sulfate 325 (65 FE) MG tablet Take 1 tablet (325 mg total) by mouth 2 (two) times daily with a meal. 60 tablet 3    glimepiride (AMARYL) 4 MG tablet Take 4 mg by mouth daily with breakfast.      isosorbide mononitrate (IMDUR) 30 MG 24 hr tablet Take 30 mg by mouth daily.      lactated ringers infusion Inject 2,000 mLs into the vein every Monday, Wednesday, and Friday. 2000 mL 21    loperamide (IMODIUM) 2 MG capsule Take 1-2 capsules (2-4 mg total) by mouth every 6 (six) hours as needed for diarrhea or loose stools (Use if >2 BM every 8 hours). 60 capsule 5    loperamide (IMODIUM) 2 MG capsule Take 1 capsule (2 mg total) by mouth 2 (two) times daily. 30 capsule 0    nitroGLYCERIN (NITROSTAT) 0.4 MG SL tablet Place 1 tablet (0.4 mg total) under the tongue every 5 (five) minutes as needed for chest pain. 25 tablet 6    pioglitazone (ACTOS) 15 MG tablet  Take 15 mg by mouth daily.      polycarbophil (FIBERCON) 625 MG tablet Take 1 tablet (625 mg total) by mouth 2 (two) times daily. 60 tablet 2    prochlorperazine (COMPAZINE) 10 MG tablet Take 10 mg by mouth every 6 (six) hours as needed for nausea or vomiting.      No current facility-administered medications for this encounter.   ROS  Review of Systems  Gastrointestinal:        LUQ ileostomy  Endocrine:       Dehydration improved  Psychiatric/Behavioral: Negative.    All other systems reviewed and are negative.  Vital signs:  BP (!) 151/85   Pulse 70   Temp 98 F (36.7 C)   Resp 18   SpO2 98%  Exam:  Physical Exam Vitals reviewed.  Constitutional:      Appearance: Normal appearance.  HENT:     Mouth/Throat:     Mouth: Mucous membranes are moist.  Abdominal:     Palpations: Abdomen is soft.  Skin:    General: Skin is warm and dry.     Findings: Erythema present.  Comments: Redness around stoma  Neurological:     Mental Status: He is alert and oriented to person, place, and time.  Psychiatric:        Mood and Affect: Mood normal.        Behavior: Behavior normal.     Stoma type/location:  LUQ ileostomy Stomal assessment/size:  1 1/2" pink and moist Peristomal assessment: erythema around stoma.  Barrier ring was stretched too large and skin exposed.  Breakdown to periphery of pouching area is resolved.   Treatment options for stomal/peristomal skin: 1 piece convex with barrier ring   use 1 1/2 barrier rings and belt Output: liquid brown Ostomy pouching: 1pc. Education provided:  Wife continues to gain confidence in ostomy care and IV hydration administration    Impression/dx  Ileostomy, high output Irritant contact dermatitis around stoma Discussion  Proper fit barrier ring  Plan  Will call clinic as needed.  Feels more comfortable with pouching now.     Visit time: 40 minutes.   Maple Hudson FNP-BC

## 2023-06-29 DIAGNOSIS — I152 Hypertension secondary to endocrine disorders: Secondary | ICD-10-CM | POA: Diagnosis not present

## 2023-06-29 DIAGNOSIS — E1122 Type 2 diabetes mellitus with diabetic chronic kidney disease: Secondary | ICD-10-CM | POA: Diagnosis not present

## 2023-06-29 DIAGNOSIS — I2510 Atherosclerotic heart disease of native coronary artery without angina pectoris: Secondary | ICD-10-CM | POA: Diagnosis not present

## 2023-06-29 DIAGNOSIS — N1832 Chronic kidney disease, stage 3b: Secondary | ICD-10-CM | POA: Diagnosis not present

## 2023-06-29 DIAGNOSIS — E860 Dehydration: Secondary | ICD-10-CM | POA: Diagnosis not present

## 2023-06-29 DIAGNOSIS — Z452 Encounter for adjustment and management of vascular access device: Secondary | ICD-10-CM | POA: Diagnosis not present

## 2023-06-29 DIAGNOSIS — N179 Acute kidney failure, unspecified: Secondary | ICD-10-CM | POA: Diagnosis not present

## 2023-06-29 DIAGNOSIS — E1159 Type 2 diabetes mellitus with other circulatory complications: Secondary | ICD-10-CM | POA: Diagnosis not present

## 2023-06-29 DIAGNOSIS — C20 Malignant neoplasm of rectum: Secondary | ICD-10-CM | POA: Diagnosis not present

## 2023-06-29 NOTE — Discharge Instructions (Signed)
See back as needed 

## 2023-07-01 ENCOUNTER — Encounter: Payer: Self-pay | Admitting: Oncology

## 2023-07-01 ENCOUNTER — Inpatient Hospital Stay: Payer: Medicare HMO | Admitting: Oncology

## 2023-07-01 ENCOUNTER — Inpatient Hospital Stay: Payer: Medicare HMO | Attending: Oncology

## 2023-07-01 VITALS — BP 117/87 | HR 78 | Temp 96.5°F | Resp 16 | Ht 72.0 in | Wt 168.0 lb

## 2023-07-01 DIAGNOSIS — N179 Acute kidney failure, unspecified: Secondary | ICD-10-CM | POA: Diagnosis not present

## 2023-07-01 DIAGNOSIS — I2510 Atherosclerotic heart disease of native coronary artery without angina pectoris: Secondary | ICD-10-CM | POA: Diagnosis not present

## 2023-07-01 DIAGNOSIS — E1165 Type 2 diabetes mellitus with hyperglycemia: Secondary | ICD-10-CM | POA: Diagnosis not present

## 2023-07-01 DIAGNOSIS — D649 Anemia, unspecified: Secondary | ICD-10-CM | POA: Insufficient documentation

## 2023-07-01 DIAGNOSIS — F17210 Nicotine dependence, cigarettes, uncomplicated: Secondary | ICD-10-CM | POA: Insufficient documentation

## 2023-07-01 DIAGNOSIS — E860 Dehydration: Secondary | ICD-10-CM | POA: Diagnosis not present

## 2023-07-01 DIAGNOSIS — E1159 Type 2 diabetes mellitus with other circulatory complications: Secondary | ICD-10-CM | POA: Diagnosis not present

## 2023-07-01 DIAGNOSIS — Z452 Encounter for adjustment and management of vascular access device: Secondary | ICD-10-CM | POA: Diagnosis not present

## 2023-07-01 DIAGNOSIS — N1832 Chronic kidney disease, stage 3b: Secondary | ICD-10-CM | POA: Diagnosis not present

## 2023-07-01 DIAGNOSIS — C20 Malignant neoplasm of rectum: Secondary | ICD-10-CM | POA: Insufficient documentation

## 2023-07-01 DIAGNOSIS — I152 Hypertension secondary to endocrine disorders: Secondary | ICD-10-CM | POA: Diagnosis not present

## 2023-07-01 DIAGNOSIS — E1122 Type 2 diabetes mellitus with diabetic chronic kidney disease: Secondary | ICD-10-CM | POA: Diagnosis not present

## 2023-07-01 LAB — CBC WITH DIFFERENTIAL/PLATELET
Abs Immature Granulocytes: 0.02 10*3/uL (ref 0.00–0.07)
Basophils Absolute: 0 10*3/uL (ref 0.0–0.1)
Basophils Relative: 1 %
Eosinophils Absolute: 0.2 10*3/uL (ref 0.0–0.5)
Eosinophils Relative: 3 %
HCT: 31.7 % — ABNORMAL LOW (ref 39.0–52.0)
Hemoglobin: 10.4 g/dL — ABNORMAL LOW (ref 13.0–17.0)
Immature Granulocytes: 0 %
Lymphocytes Relative: 6 %
Lymphs Abs: 0.3 10*3/uL — ABNORMAL LOW (ref 0.7–4.0)
MCH: 31.9 pg (ref 26.0–34.0)
MCHC: 32.8 g/dL (ref 30.0–36.0)
MCV: 97.2 fL (ref 80.0–100.0)
Monocytes Absolute: 0.4 10*3/uL (ref 0.1–1.0)
Monocytes Relative: 7 %
Neutro Abs: 5 10*3/uL (ref 1.7–7.7)
Neutrophils Relative %: 83 %
Platelets: 193 10*3/uL (ref 150–400)
RBC: 3.26 MIL/uL — ABNORMAL LOW (ref 4.22–5.81)
RDW: 13.5 % (ref 11.5–15.5)
WBC: 5.9 10*3/uL (ref 4.0–10.5)
nRBC: 0 % (ref 0.0–0.2)

## 2023-07-01 LAB — CMP (CANCER CENTER ONLY)
ALT: 13 U/L (ref 0–44)
AST: 17 U/L (ref 15–41)
Albumin: 3.6 g/dL (ref 3.5–5.0)
Alkaline Phosphatase: 82 U/L (ref 38–126)
Anion gap: 8 (ref 5–15)
BUN: 22 mg/dL (ref 8–23)
CO2: 23 mmol/L (ref 22–32)
Calcium: 9.2 mg/dL (ref 8.9–10.3)
Chloride: 105 mmol/L (ref 98–111)
Creatinine: 1.24 mg/dL (ref 0.61–1.24)
GFR, Estimated: >60 mL/min (ref >60)
Glucose, Bld: 146 mg/dL — ABNORMAL HIGH (ref 70–99)
Potassium: 4.7 mmol/L (ref 3.5–5.1)
Sodium: 136 mmol/L (ref 135–145)
Total Bilirubin: 0.4 mg/dL (ref 0.3–1.2)
Total Protein: 6.9 g/dL (ref 6.5–8.1)

## 2023-07-01 NOTE — Progress Notes (Signed)
[facility]  [phone] Fax:(336) [phone]  ID: [name] [name] OB: 01/05/1946  MR#: [id number]  GLO#:[id number]  Patient Care Team: Corky Downs, MD as PCP - General (Internal Medicine) Antonieta Iba, MD as PCP - Cardiology (Cardiology) Antonieta Iba, MD as Consulting Physician (Cardiology) Benita Gutter, RN as Oncology Nurse Navigator Orlie Dakin, Tollie Pizza, MD as Consulting Physician (Oncology) [name], MD as Consulting Physician (Colon and Rectal Surgery)  CHIEF COMPLAINT: Pathologic stage IIa rectal adenocarcinoma.  INTERVAL HISTORY: Patient returns to clinic today for repeat laboratory work and further evaluation.  He continues to have significant weakness and fatigue and receives IV fluids with home health at least 3 times per week. He has no neurologic complaints.  He denies any recent fevers or illnesses.  He has no chest pain, shortness of breath, cough, or hemoptysis.  He denies any nausea, vomiting, constipation, or diarrhea.  He does not report any melena or hematochezia.  He has no urinary complaints.  Patient offers no further specific complaints today.  REVIEW OF SYSTEMS:   Review of Systems  Constitutional:  Positive for malaise/fatigue. Negative for fever and weight loss.  Respiratory: Negative.  Negative for cough, hemoptysis and shortness of breath.   Cardiovascular: Negative.  Negative for chest pain and leg swelling.  Gastrointestinal: Negative.  Negative for abdominal pain and diarrhea.  Genitourinary: Negative.  Negative for dysuria.  Musculoskeletal: Negative.  Negative for back pain.  Skin: Negative.  Negative for rash.  Neurological:  Positive for weakness. Negative for dizziness, focal weakness and headaches.  Psychiatric/Behavioral: Negative.  The patient is not nervous/anxious.     As per HPI. Otherwise, a complete review of systems is negative.  PAST MEDICAL HISTORY: Past Medical History:  Diagnosis  Date   Anemia    Anginal pain (HCC)    Bradycardia    Cancer (HCC)    Coronary artery disease    x 1 stent   Depression    Diabetes mellitus without complication (HCC)    GERD (gastroesophageal reflux disease)    History of kidney stones    Hyperlipidemia    Hypertension    Nephrolithiasis    Partial Large bowel obstruction (HCC)     PAST SURGICAL HISTORY: Past Surgical History:  Procedure Laterality Date   CARDIAC CATHETERIZATION     CATARACT EXTRACTION     COLONOSCOPY WITH PROPOFOL N/A 05/05/2023   Procedure: COLONOSCOPY WITH PROPOFOL;  Surgeon: Regis Bill, MD;  Location: [facility];  Service: Endoscopy;  Laterality: N/A;  Patient having surgery tomorrow, 05/06/2023. Ok'd per [place] and office   CORONARY ANGIOPLASTY     EYE SURGERY     FLEXIBLE SIGMOIDOSCOPY N/A 07/27/2022   Procedure: FLEXIBLE SIGMOIDOSCOPY;  Surgeon: Regis Bill, MD;  Location: [facility];  Service: Endoscopy;  Laterality: N/A;   KIDNEY STONE SURGERY     NECK SURGERY  12/18/2011   nephrolithiasis     PORTACATH PLACEMENT Right 08/24/2022   Procedure: INSERTION PORT-A-CATH;  Surgeon: Campbell Lerner, MD;  Location: [facility] ORS;  Service: General;  Laterality: Right;   stent (other)     TRANSVERSE LOOP COLOSTOMY N/A 07/28/2022   Procedure: TRANSVERSE LOOP COLOSTOMY;  Surgeon: Campbell Lerner, MD;  Location: [facility];  Service: General;  Laterality: N/A;   XI ROBOTIC ASSISTED LOWER ANTERIOR RESECTION N/A 05/06/2023   Procedure: XI ROBOTIC ASSISTED LOWER ANTERIOR RECTOSIGMOID RESECTION, TAKEDOWN OF OLD LOOP COLOSTOMY WITH ANASTOMOSIS, MOBILIZATION OF SPLENIC FLEXURE OF COLON, AND NEW DIVERTING LOOP ILEOSTOMY,  INTRAOPERATIVE ASSESSMENT OF TISSUE VASCULAR PERFUSION USING ICG (INDOCYANINE GREEN) IMMUNOFLUORESCENCE, FLEXIBLE SIGMOIDOSCOPY;  Surgeon: [name], MD;  Location: [facility];  Service: General;  Lateralit    FAMILY HISTORY: Family History  Problem Relation Age of Onset   Heart  disease Father     ADVANCED DIRECTIVES (Y/N):  N  HEALTH MAINTENANCE: Social History   Tobacco Use   Smoking status: Every Day    Current packs/day: 0.25    Average packs/day: 0.3 packs/day for 36.0 years (9.0 ttl pk-yrs)    Types: Cigarettes   Smokeless tobacco: Never   Tobacco comments:    Smokes 6 cigarettes daily   Vaping Use   Vaping status: Never Used  Substance Use Topics   Alcohol use: No   Drug use: No     Colonoscopy:  PAP:  Bone density:  Lipid panel:  No Known Allergies  Current Outpatient Medications  Medication Sig Dispense Refill   aspirin 81 MG EC tablet Take 81 mg by mouth daily.       atorvastatin (LIPITOR) 40 MG tablet Take 1 tablet (40 mg total) by mouth daily. 90 tablet 3   diphenoxylate-atropine (LOMOTIL) 2.5-0.025 MG tablet Take 1 tablet by mouth 2 (two) times daily. 30 tablet 5   famotidine (PEPCID) 40 MG tablet Take 1 tablet (40 mg total) by mouth daily. 90 tablet 3   ferrous sulfate 325 (65 FE) MG tablet Take 1 tablet (325 mg total) by mouth 2 (two) times daily with a meal. 60 tablet 3   glimepiride (AMARYL) 4 MG tablet Take 4 mg by mouth daily with breakfast.     isosorbide mononitrate (IMDUR) 30 MG 24 hr tablet Take 30 mg by mouth daily.     lactated ringers infusion Inject 2,000 mLs into the vein every Monday, Wednesday, and Friday. 2000 mL 21   loperamide (IMODIUM) 2 MG capsule Take 1-2 capsules (2-4 mg total) by mouth every 6 (six) hours as needed for diarrhea or loose stools (Use if >2 BM every 8 hours). 60 capsule 5   loperamide (IMODIUM) 2 MG capsule Take 1 capsule (2 mg total) by mouth 2 (two) times daily. 30 capsule 0   nitroGLYCERIN (NITROSTAT) 0.4 MG SL tablet Place 1 tablet (0.4 mg total) under the tongue every 5 (five) minutes as needed for chest pain. 25 tablet 6   pioglitazone (ACTOS) 15 MG tablet Take 15 mg by mouth daily.     polycarbophil (FIBERCON) 625 MG tablet Take 1 tablet (625 mg total) by mouth 2 (two) times daily. 60  tablet 2   prochlorperazine (COMPAZINE) 10 MG tablet Take 10 mg by mouth every 6 (six) hours as needed for nausea or vomiting.     No current facility-administered medications for this visit.    OBJECTIVE: Vitals:   07/01/23 1047  BP: 117/87  Pulse: 78  Resp: 16  Temp: (!) 96.5 F (35.8 C)  SpO2: 99%      Body mass index is 22.78 kg/m.    ECOG FS:0 - Asymptomatic  General: Well-developed, well-nourished, no acute distress. Eyes: Pink conjunctiva, anicteric sclera. HEENT: Normocephalic, moist mucous membranes. Lungs: No audible wheezing or coughing. Heart: Regular rate and rhythm. Abdomen: Soft, nontender, no obvious distention.  Colostomy bag noted. Musculoskeletal: No edema, cyanosis, or clubbing. Neuro: Alert, answering all questions appropriately. Cranial nerves grossly intact. Skin: No rashes or petechiae noted. Psych: Normal affect.  LAB RESULTS:  Lab Results  Component Value Date   NA 136 07/01/2023   K  4.7 07/01/2023   CL 105 07/01/2023   CO2 23 07/01/2023   GLUCOSE 146 (H) 07/01/2023   BUN 22 07/01/2023   CREATININE 1.24 07/01/2023   CALCIUM 9.2 07/01/2023   PROT 6.9 07/01/2023   ALBUMIN 3.6 07/01/2023   AST 17 07/01/2023   ALT 13 07/01/2023   ALKPHOS 82 07/01/2023   BILITOT 0.4 07/01/2023   GFRNONAA >60 07/01/2023   GFRAA >60 12/20/2014    Lab Results  Component Value Date   WBC 5.9 07/01/2023   NEUTROABS 5.0 07/01/2023   HGB 10.4 (L) 07/01/2023   HCT 31.7 (L) 07/01/2023   MCV 97.2 07/01/2023   PLT 193 07/01/2023     STUDIES: [place] EKG SITE RITE  Result Date: 06/08/2023 If Site Rite image not attached, placement could not be confirmed due to current cardiac rhythm.   ASSESSMENT: Pathologic stage IIa rectal adenocarcinoma.  PLAN:    Pathologic stage IIa rectal adenocarcinoma: Diagnosis confirmed by imaging and biopsy.  MRI and CT scan results reviewed independently confirming stage of disease.  Patient completed neoadjuvant FOLFOX on  December 30, 2022 and neoadjuvant Xeloda/XRT on February 17, 2023.  He underwent surgical resection on May 06, 2023 and was noted to have residual disease.  By report he had clear margins with 0 of 10 lymph nodes positive for malignancy.  Patient has an appointment with surgery later in the month to determine whether his colostomy is reversible.  No further intervention is needed at this time.  Return to clinic in 3 months with repeat imaging, laboratory work, and further evaluation. Anemia: Chronic and unchanged.  Patient's hemoglobin is 10.4. Hyperglycemia: Patient noted to have improved blood glucose control.  Continue monitoring and treatment per primary care. Renal insufficiency: Resolved.  Patient continues to receive IV fluids through home health 3-4 times per week.  He expressed hope that this will be discontinued after he meets with the surgeon at the end of the month.   Ostomy: Follow-up with surgeon as scheduled.  Patient expressed understanding and was in agreement with this plan. He also understands that He can call clinic at any time with any questions, concerns, or complaints.    Cancer Staging  Rectal adenocarcinoma [facility]) Staging form: Colon and Rectum, AJCC 8th Edition - Clinical stage from 08/13/2022: Stage IIA (cT3, cN0, cM0) - Signed by Jeralyn Ruths, MD on 08/13/2022 Total positive nodes: 0 - Pathologic stage from 05/24/2023: Stage IIA (ypT3, pN0, cM0) - Signed by Jeralyn Ruths, MD on 05/24/2023 Stage prefix: Post-therapy Total positive nodes: 0  Jeralyn Ruths, MD   07/01/2023 11:18 AM

## 2023-07-02 DIAGNOSIS — R198 Other specified symptoms and signs involving the digestive system and abdomen: Secondary | ICD-10-CM | POA: Diagnosis not present

## 2023-07-02 DIAGNOSIS — N179 Acute kidney failure, unspecified: Secondary | ICD-10-CM | POA: Diagnosis not present

## 2023-07-02 DIAGNOSIS — E860 Dehydration: Secondary | ICD-10-CM | POA: Diagnosis not present

## 2023-07-02 DIAGNOSIS — Z932 Ileostomy status: Secondary | ICD-10-CM | POA: Diagnosis not present

## 2023-07-02 LAB — CEA: CEA: 4.6 ng/mL (ref 0.0–4.7)

## 2023-07-03 ENCOUNTER — Other Ambulatory Visit: Payer: Self-pay

## 2023-07-08 DIAGNOSIS — E860 Dehydration: Secondary | ICD-10-CM | POA: Diagnosis not present

## 2023-07-08 DIAGNOSIS — I2510 Atherosclerotic heart disease of native coronary artery without angina pectoris: Secondary | ICD-10-CM | POA: Diagnosis not present

## 2023-07-08 DIAGNOSIS — E1159 Type 2 diabetes mellitus with other circulatory complications: Secondary | ICD-10-CM | POA: Diagnosis not present

## 2023-07-08 DIAGNOSIS — I152 Hypertension secondary to endocrine disorders: Secondary | ICD-10-CM | POA: Diagnosis not present

## 2023-07-08 DIAGNOSIS — E1122 Type 2 diabetes mellitus with diabetic chronic kidney disease: Secondary | ICD-10-CM | POA: Diagnosis not present

## 2023-07-08 DIAGNOSIS — C20 Malignant neoplasm of rectum: Secondary | ICD-10-CM | POA: Diagnosis not present

## 2023-07-08 DIAGNOSIS — Z452 Encounter for adjustment and management of vascular access device: Secondary | ICD-10-CM | POA: Diagnosis not present

## 2023-07-08 DIAGNOSIS — N1832 Chronic kidney disease, stage 3b: Secondary | ICD-10-CM | POA: Diagnosis not present

## 2023-07-08 DIAGNOSIS — N179 Acute kidney failure, unspecified: Secondary | ICD-10-CM | POA: Diagnosis not present

## 2023-07-12 ENCOUNTER — Other Ambulatory Visit: Payer: Self-pay

## 2023-07-12 ENCOUNTER — Ambulatory Visit: Payer: Self-pay | Admitting: Surgery

## 2023-07-13 ENCOUNTER — Other Ambulatory Visit: Payer: Self-pay | Admitting: Surgery

## 2023-07-13 DIAGNOSIS — C20 Malignant neoplasm of rectum: Secondary | ICD-10-CM

## 2023-07-15 DIAGNOSIS — C20 Malignant neoplasm of rectum: Secondary | ICD-10-CM | POA: Diagnosis not present

## 2023-07-15 DIAGNOSIS — E1159 Type 2 diabetes mellitus with other circulatory complications: Secondary | ICD-10-CM | POA: Diagnosis not present

## 2023-07-15 DIAGNOSIS — I152 Hypertension secondary to endocrine disorders: Secondary | ICD-10-CM | POA: Diagnosis not present

## 2023-07-15 DIAGNOSIS — N179 Acute kidney failure, unspecified: Secondary | ICD-10-CM | POA: Diagnosis not present

## 2023-07-15 DIAGNOSIS — N1832 Chronic kidney disease, stage 3b: Secondary | ICD-10-CM | POA: Diagnosis not present

## 2023-07-15 DIAGNOSIS — E860 Dehydration: Secondary | ICD-10-CM | POA: Diagnosis not present

## 2023-07-15 DIAGNOSIS — Z452 Encounter for adjustment and management of vascular access device: Secondary | ICD-10-CM | POA: Diagnosis not present

## 2023-07-15 DIAGNOSIS — I2510 Atherosclerotic heart disease of native coronary artery without angina pectoris: Secondary | ICD-10-CM | POA: Diagnosis not present

## 2023-07-15 DIAGNOSIS — E1122 Type 2 diabetes mellitus with diabetic chronic kidney disease: Secondary | ICD-10-CM | POA: Diagnosis not present

## 2023-07-23 DIAGNOSIS — E1122 Type 2 diabetes mellitus with diabetic chronic kidney disease: Secondary | ICD-10-CM | POA: Diagnosis not present

## 2023-07-23 DIAGNOSIS — I152 Hypertension secondary to endocrine disorders: Secondary | ICD-10-CM | POA: Diagnosis not present

## 2023-07-23 DIAGNOSIS — I2510 Atherosclerotic heart disease of native coronary artery without angina pectoris: Secondary | ICD-10-CM | POA: Diagnosis not present

## 2023-07-23 DIAGNOSIS — N179 Acute kidney failure, unspecified: Secondary | ICD-10-CM | POA: Diagnosis not present

## 2023-07-23 DIAGNOSIS — E1159 Type 2 diabetes mellitus with other circulatory complications: Secondary | ICD-10-CM | POA: Diagnosis not present

## 2023-07-23 DIAGNOSIS — C20 Malignant neoplasm of rectum: Secondary | ICD-10-CM | POA: Diagnosis not present

## 2023-07-23 DIAGNOSIS — N1832 Chronic kidney disease, stage 3b: Secondary | ICD-10-CM | POA: Diagnosis not present

## 2023-07-23 DIAGNOSIS — Z452 Encounter for adjustment and management of vascular access device: Secondary | ICD-10-CM | POA: Diagnosis not present

## 2023-07-23 DIAGNOSIS — E860 Dehydration: Secondary | ICD-10-CM | POA: Diagnosis not present

## 2023-08-06 ENCOUNTER — Ambulatory Visit (INDEPENDENT_AMBULATORY_CARE_PROVIDER_SITE_OTHER): Payer: Medicare HMO | Admitting: Family

## 2023-08-06 VITALS — BP 122/60 | HR 79 | Ht 72.0 in | Wt 168.0 lb

## 2023-08-06 DIAGNOSIS — I152 Hypertension secondary to endocrine disorders: Secondary | ICD-10-CM

## 2023-08-06 DIAGNOSIS — E1142 Type 2 diabetes mellitus with diabetic polyneuropathy: Secondary | ICD-10-CM

## 2023-08-06 DIAGNOSIS — E1159 Type 2 diabetes mellitus with other circulatory complications: Secondary | ICD-10-CM | POA: Diagnosis not present

## 2023-08-06 DIAGNOSIS — R7303 Prediabetes: Secondary | ICD-10-CM

## 2023-08-06 DIAGNOSIS — C20 Malignant neoplasm of rectum: Secondary | ICD-10-CM

## 2023-08-06 DIAGNOSIS — E1169 Type 2 diabetes mellitus with other specified complication: Secondary | ICD-10-CM

## 2023-08-06 DIAGNOSIS — E785 Hyperlipidemia, unspecified: Secondary | ICD-10-CM

## 2023-08-06 LAB — POC CREATINE & ALBUMIN,URINE
Creatinine, POC: 200 mg/dL
Microalbumin Ur, POC: 80 mg/L

## 2023-08-07 ENCOUNTER — Other Ambulatory Visit: Payer: Self-pay

## 2023-08-12 ENCOUNTER — Inpatient Hospital Stay: Payer: Medicare HMO | Attending: Oncology

## 2023-08-12 ENCOUNTER — Ambulatory Visit
Admission: RE | Admit: 2023-08-12 | Discharge: 2023-08-12 | Disposition: A | Discharge status: Home / Self Care | Payer: Medicare HMO | Source: Ambulatory Visit | Attending: Surgery | Admitting: Surgery

## 2023-08-12 DIAGNOSIS — K5730 Diverticulosis of large intestine without perforation or abscess without bleeding: Secondary | ICD-10-CM | POA: Diagnosis not present

## 2023-08-12 DIAGNOSIS — C20 Malignant neoplasm of rectum: Secondary | ICD-10-CM | POA: Insufficient documentation

## 2023-08-12 MED ORDER — IOHEXOL 300 MG/ML  SOLN
300.0000 mL | Freq: Once | INTRAMUSCULAR | Status: DC | PRN
  Administered 2023-08-12: 450 mL

## 2023-08-18 ENCOUNTER — Ambulatory Visit: Payer: Medicare HMO | Admitting: Radiation Oncology

## 2023-08-22 ENCOUNTER — Encounter: Payer: Self-pay | Admitting: Family

## 2023-08-22 NOTE — Assessment & Plan Note (Signed)
Blood pressure well controlled with current medications.  Continue current therapy.  Will reassess at follow up.  

## 2023-08-22 NOTE — Assessment & Plan Note (Signed)
Checking labs today. Will call pt. With results  Continue current diabetes POC, as patient has been well controlled on current regimen.  Will adjust meds if needed based on labs.  

## 2023-08-22 NOTE — Assessment & Plan Note (Signed)
Patient is seen by Oncology, who manage this condition.  He is well controlled with current therapy.   Will defer to them for further changes to plan of care.

## 2023-08-22 NOTE — Progress Notes (Signed)
Established Patient Office Visit  Subjective:  Patient ID: [name] [name], male    DOB: 09/02/1946  Age: 77 y.o. MRN: [id number]  Chief Complaint  Patient presents with   Follow-up    6 Months Follow Up    Patient is here today for his 3 months follow up.  He has been feeling well since last appointment.   He does not have additional concerns to discuss today.  Labs are due today. He needs refills.   I have reviewed his active problem list, medication list, allergies, notes from last encounter, lab results for his appointment today.      No other concerns at this time.   Past Medical History:  Diagnosis Date   Anemia    Anginal pain (HCC)    Bradycardia    Cancer (HCC)    Coronary artery disease    x 1 stent   Depression    Diabetes mellitus without complication (HCC)    GERD (gastroesophageal reflux disease)    History of kidney stones    Hyperlipidemia    Hypertension    Nephrolithiasis    Partial Large bowel obstruction (HCC)     Past Surgical History:  Procedure Laterality Date   CARDIAC CATHETERIZATION     CATARACT EXTRACTION     COLONOSCOPY WITH PROPOFOL N/A 05/05/2023   Procedure: COLONOSCOPY WITH PROPOFOL;  Surgeon: Regis Bill, MD;  Location: [facility];  Service: Endoscopy;  Laterality: N/A;  Patient having surgery tomorrow, 05/06/2023. Ok'd per [place] and office   CORONARY ANGIOPLASTY     EYE SURGERY     FLEXIBLE SIGMOIDOSCOPY N/A 07/27/2022   Procedure: FLEXIBLE SIGMOIDOSCOPY;  Surgeon: Regis Bill, MD;  Location: [facility];  Service: Endoscopy;  Laterality: N/A;   KIDNEY STONE SURGERY     NECK SURGERY  12/18/2011   nephrolithiasis     PORTACATH PLACEMENT Right 08/24/2022   Procedure: INSERTION PORT-A-CATH;  Surgeon: Campbell Lerner, MD;  Location: [facility] OR[facility];  Service: General;  Laterality: Right;   stent (other)     TRANSVERSE LOOP COLOSTOMY N/A 07/28/2022   Procedure: TRANSVERSE LOOP COLOSTOMY;  Surgeon:  Campbell Lerner, MD;  Location: [facility];  Service: General;  Laterality: N/A;   XI ROBOTIC ASSISTED LOWER ANTERIOR RESECTION N/A 05/06/2023   Procedure: XI ROBOTIC ASSISTED LOWER ANTERIOR RECTOSIGMOID RESECTION, TAKEDOWN OF OLD LOOP COLOSTOMY WITH ANASTOMOSIS, MOBILIZATION OF SPLENIC FLEXURE OF COLON, AND NEW DIVERTING LOOP ILEOSTOMY, INTRAOPERATIVE ASSESSMENT OF TISSUE VASCULAR PERFUSION USING ICG (INDOCYANINE GREEN) IMMUNOFLUORESCENCE, FLEXIBLE SIGMOIDOSCOPY;  Surgeon: [name], MD;  Location: [facility];  Service: General;  Lateralit    Social History   Socioeconomic History   Marital status: Married    Spouse name: Doris   Number of children: Not on file   Years of education: Not on file   Highest education level: Not on file  Occupational History   Not on file  Tobacco Use   Smoking status: Every Day    Current packs/day: 0.25    Average packs/day: 0.3 packs/day for 36.0 years (9.0 ttl pk-yrs)    Types: Cigarettes   Smokeless tobacco: Never   Tobacco comments:    Smokes 6 cigarettes daily   Vaping Use   Vaping status: Never Used  Substance and Sexual Activity   Alcohol use: No   Drug use: No   Sexual activity: Not on file  Other Topics Concern   Not on file  Social History Narrative   Not on file   Social Determinants  of Health   Financial Resource Strain: Not on file  Food Insecurity: No Food Insecurity (06/08/2023)   Hunger Vital Sign    Worried About Running Out of Food in the Last Year: Never true    Ran Out of Food in the Last Year: Never true  Transportation Needs: No Transportation Needs (06/08/2023)   PRAPARE - Administrator, Civil Service (Medical): No    Lack of Transportation (Non-Medical): No  Physical Activity: Not on file  Stress: Not on file  Social Connections: Not on file  Intimate Partner Violence: Not At Risk (06/08/2023)   Humiliation, Afraid, Rape, and Kick questionnaire    Fear of Current or Ex-Partner: No    Emotionally Abused:  No    Physically Abused: No    Sexually Abused: No    Family History  Problem Relation Age of Onset   Heart disease Father     No Known Allergies  Review of Systems  All other systems reviewed and are negative.      Objective:   BP 122/60   Pulse 79   Ht 6' (1.829 m)   Wt 168 lb (76.2 kg)   SpO2 97%   BMI 22.78 kg/m   Vitals:   08/06/23 0954  BP: 122/60  Pulse: 79  Height: 6' (1.829 m)  Weight: 168 lb (76.2 kg)  SpO2: 97%  BMI (Calculated): 22.78    Physical Exam Vitals and nursing note reviewed.  Constitutional:      Appearance: Normal appearance. He is normal weight.  Eyes:     Extraocular Movements: Extraocular movements intact.     Conjunctiva/sclera: Conjunctivae normal.     Pupils: Pupils are equal, round, and reactive to light.  Cardiovascular:     Rate and Rhythm: Normal rate and regular rhythm.     Pulses: Normal pulses.     Heart sounds: Normal heart sounds.  Pulmonary:     Effort: Pulmonary effort is normal.     Breath sounds: Normal breath sounds.  Musculoskeletal:        General: Normal range of motion.  Neurological:     General: No focal deficit present.     Mental Status: He is alert and oriented to person, place, and time. Mental status is at baseline.  Psychiatric:        Mood and Affect: Mood normal.        Behavior: Behavior normal.        Thought Content: Thought content normal.        Judgment: Judgment normal.      Results for orders placed or performed in visit on 08/06/23  POC CREATINE & ALBUMIN,URINE  Result Value Ref Range   Microalbumin Ur, POC 80 mg/L   Creatinine, POC 200 mg/dL   Albumin/Creatinine Ratio, Urine, POC 30-300     Recent Results (from the past 2160 hour(s))  Glucose, capillary     Status: Abnormal   Collection Time: 06/08/23 11:55 AM  Result Value Ref Range   Glucose-Capillary 164 (H) 70 - 99 mg/dL    Comment: Glucose reference range applies only to samples taken after fasting for at least 8  hours.  Glucose, capillary     Status: Abnormal   Collection Time: 06/08/23  9:35 PM  Result Value Ref Range   Glucose-Capillary 111 (H) 70 - 99 mg/dL    Comment: Glucose reference range applies only to samples taken after fasting for at least 8 hours.   Comment 1 Notify RN  Basic metabolic panel     Status: Abnormal   Collection Time: 06/09/23  1:45 AM  Result Value Ref Range   Sodium 134 (L) 135 - 145 mmol/L   Potassium 3.8 3.5 - 5.1 mmol/L   Chloride 100 98 - 111 mmol/L   CO2 23 22 - 32 mmol/L   Glucose, Bld 105 (H) 70 - 99 mg/dL    Comment: Glucose reference range applies only to samples taken after fasting for at least 8 hours.   BUN 79 (H) 8 - 23 mg/dL   Creatinine, Ser 1.61 (H) 0.61 - 1.24 mg/dL   Calcium 8.6 (L) 8.9 - 10.3 mg/dL   GFR, Estimated 31 (L) >60 mL/min    Comment: (NOTE) Calculated using the CKD-EPI Creatinine Equation (2021)    Anion gap 11 5 - 15    Comment: Performed at [facility], [address] [place], [place] [address]  CBC     Status: Abnormal   Collection Time: 06/09/23  1:45 AM  Result Value Ref Range   WBC 6.1 4.0 - 10.5 K/uL   RBC 3.03 (L) 4.22 - 5.81 MIL/uL   Hemoglobin 9.7 (L) 13.0 - 17.0 g/dL   HCT 54.0 (L) 98.1 - 19.1 %   MCV 93.7 80.0 - 100.0 fL   MCH 32.0 26.0 - 34.0 pg   MCHC 34.2 30.0 - 36.0 g/dL   RDW 47.8 29.5 - 62.1 %   Platelets 117 (L) 150 - 400 K/uL   nRBC 0.0 0.0 - 0.2 %    Comment: Performed at [facility], [address] [place], [place] [address]  Phosphorus     Status: None   Collection Time: 06/09/23  1:45 AM  Result Value Ref Range   Phosphorus 4.3 2.5 - 4.6 mg/dL    Comment: Performed at [facility], [address], [place], [place] [address]  Magnesium     Status: None   Collection Time: 06/09/23  1:45 AM  Result Value Ref Range   Magnesium 1.9 1.7 - 2.4 mg/dL    Comment: Performed at [facility], [address], [place], [place]  [address]  Glucose, capillary     Status: Abnormal   Collection Time: 06/09/23  8:11 AM  Result Value Ref Range   Glucose-Capillary 109 (H) 70 - 99 mg/dL    Comment: Glucose reference range applies only to samples taken after fasting for at least 8 hours.  Vitamin B12     Status: Abnormal   Collection Time: 06/09/23 10:29 AM  Result Value Ref Range   Vitamin B-12 2,089 (H) 180 - 914 pg/mL    Comment: RESULT CONFIRMED BY MANUAL DILUTION (NOTE) This assay is not validated for testing neonatal or myeloproliferative syndrome specimens for Vitamin B12 levels. Performed at [facility], [address], [place], [place] [address]   Folate     Status: None   Collection Time: 06/09/23 10:29 AM  Result Value Ref Range   Folate 21.8 >5.9 ng/mL    Comment: Performed at [facility], [address] [place], [place] [address]  Iron and TIBC     Status: None   Collection Time: 06/09/23 10:29 AM  Result Value Ref Range   Iron 91 45 - 182 ug/dL   TIBC 272 536 - 644 ug/dL   Saturation Ratios 31 17.9 - 39.5 %   UIBC 204 ug/dL    Comment: Performed at [facility], [address] [place], [place] [address]  Ferritin     Status: None   Collection Time: 06/09/23 10:29 AM  Result Value Ref Range   Ferritin 100 24 - 336 ng/mL    Comment: Performed at [facility], [address] [place], [place] [address]  Reticulocytes     Status: Abnormal   Collection Time: 06/09/23 10:29 AM  Result Value Ref Range   Retic Ct Pct 0.9 0.4 - 3.1 %   RBC. 2.92 (L) 4.22 - 5.81 MIL/uL   Retic Count, Absolute 26.3 19.0 - 186.0 K/uL   Immature Retic Fract 3.8 2.3 - 15.9 %    Comment: Performed at [facility], [address], [place], [place] [address]  Lipase, blood     Status: Abnormal   Collection Time: 06/09/23 10:29 AM  Result Value Ref Range   Lipase 52 (H) 11 - 51 U/L    Comment: Performed at [facility], [address] [place], [place] [address]  Hepatic function panel     Status: Abnormal   Collection Time: 06/09/23 10:29 AM  Result Value Ref Range   Total Protein 5.5 (L) 6.5 - 8.1 g/dL   Albumin 2.9 (L) 3.5 - 5.0 g/dL   AST 19 15 - 41 U/L   ALT 20 0 - 44 U/L   Alkaline Phosphatase 70 38 - 126 U/L   Total Bilirubin 0.7 0.3 - 1.2 mg/dL   Bilirubin, Direct <4.6 0.0 - 0.2 mg/dL   Indirect Bilirubin NOT CALCULATED 0.3 - 0.9 mg/dL    Comment: Performed at [facility], [address] [place], [place] [address]  Prealbumin     Status: None   Collection Time: 06/09/23 10:29 AM  Result Value Ref Range   Prealbumin 25 18 - 38 mg/dL    Comment: Performed at [facility], [address], [place], [place] [address]  Glucose, capillary     Status: None   Collection Time: 06/09/23 12:09 PM  Result Value Ref Range   Glucose-Capillary 85 70 - 99 mg/dL    Comment: Glucose reference range applies only to samples taken after fasting for at least 8 hours.  Glucose, capillary     Status: Abnormal   Collection Time: 06/09/23  4:47 PM  Result Value Ref Range   Glucose-Capillary 111 (H) 70 - 99 mg/dL    Comment: Glucose reference range applies only to samples taken after fasting for at least 8 hours.  Glucose, capillary     Status: Abnormal   Collection Time: 06/09/23 10:21 PM  Result Value Ref Range   Glucose-Capillary 139 (H) 70 - 99 mg/dL    Comment: Glucose reference range applies only to samples taken after fasting for at least 8 hours.   Comment 1 Notify RN    Comment 2 Document in Chart   Hemoglobin     Status: Abnormal   Collection Time: 06/10/23  3:28 AM  Result Value Ref Range   Hemoglobin 9.1 (L) 13.0 - 17.0 g/dL    Comment: Performed at [facility], [address] [place], [place] [address]  Basic metabolic panel     Status: Abnormal   Collection Time: 06/10/23  3:28 AM  Result Value Ref Range   Sodium 135 135 - 145 mmol/L    Potassium 3.8 3.5 - 5.1 mmol/L   Chloride 101 98 - 111 mmol/L   CO2 25 22 - 32 mmol/L   Glucose, Bld  112 (H) 70 - 99 mg/dL    Comment: Glucose reference range applies only to samples taken after fasting for at least 8 hours.   BUN 47 (H) 8 - 23 mg/dL   Creatinine, Ser 8.65 0.61 - 1.24 mg/dL    Comment: DELTA CHECK NOTED   Calcium 8.5 (L) 8.9 - 10.3 mg/dL   GFR, Estimated >78 >46 mL/min    Comment: (NOTE) Calculated using the CKD-EPI Creatinine Equation (2021)    Anion gap 9 5 - 15    Comment: Performed at [facility], [address] [place], [place] [address]  Glucose, capillary     Status: Abnormal   Collection Time: 06/10/23  8:10 AM  Result Value Ref Range   Glucose-Capillary 102 (H) 70 - 99 mg/dL    Comment: Glucose reference range applies only to samples taken after fasting for at least 8 hours.  Glucose, capillary     Status: Abnormal   Collection Time: 06/10/23 12:04 PM  Result Value Ref Range   Glucose-Capillary 160 (H) 70 - 99 mg/dL    Comment: Glucose reference range applies only to samples taken after fasting for at least 8 hours.  CEA     Status: None   Collection Time: 07/01/23 10:14 AM  Result Value Ref Range   CEA 4.6 0.0 - 4.7 ng/mL    Comment: (NOTE)                             Nonsmokers          <3.9                             Smokers             <5.6 [employer] Electrochemiluminescence Immunoassay (ECLIA) Values obtained with different assay methods or kits cannot be used interchangeably.  Results cannot be interpreted as absolute evidence of the presence or absence of malignant disease. Performed At: [facility] [address] [place], [place] [id number] Jolene Schimke MD NU:[id number]   CMP (Cancer Center only)     Status: Abnormal   Collection Time: 07/01/23 10:14 AM  Result Value Ref Range   Sodium 136 135 - 145 mmol/L   Potassium 4.7 3.5 - 5.1 mmol/L   Chloride 105 98 - 111 mmol/L   CO2 23 22 - 32 mmol/L    Glucose, Bld 146 (H) 70 - 99 mg/dL    Comment: Glucose reference range applies only to samples taken after fasting for at least 8 hours.   BUN 22 8 - 23 mg/dL   Creatinine 3.47 4.25 - 1.24 mg/dL   Calcium 9.2 8.9 - 95.6 mg/dL   Total Protein 6.9 6.5 - 8.1 g/dL   Albumin 3.6 3.5 - 5.0 g/dL   AST 17 15 - 41 U/L   ALT 13 0 - 44 U/L   Alkaline Phosphatase 82 38 - 126 U/L   Total Bilirubin 0.4 0.3 - 1.2 mg/dL   GFR, Estimated >38 >75 mL/min    Comment: (NOTE) Calculated using the CKD-EPI Creatinine Equation (2021)    Anion gap 8 5 - 15    Comment: Performed at [facility], [address] [place], [place] [address]  CBC with Differential/Platelet     Status: Abnormal   Collection Time: 07/01/23 10:14 AM  Result Value Ref Range   WBC 5.9 4.0 - 10.5 K/uL  RBC 3.26 (L) 4.22 - 5.81 MIL/uL   Hemoglobin 10.4 (L) 13.0 - 17.0 g/dL   HCT 11.9 (L) 14.7 - 82.9 %   MCV 97.2 80.0 - 100.0 fL   MCH 31.9 26.0 - 34.0 pg   MCHC 32.8 30.0 - 36.0 g/dL   RDW 56.2 13.0 - 86.5 %   Platelets 193 150 - 400 K/uL   nRBC 0.0 0.0 - 0.2 %   Neutrophils Relative % 83 %   Neutro Abs 5.0 1.7 - 7.7 K/uL   Lymphocytes Relative 6 %   Lymphs Abs 0.3 (L) 0.7 - 4.0 K/uL   Monocytes Relative 7 %   Monocytes Absolute 0.4 0.1 - 1.0 K/uL   Eosinophils Relative 3 %   Eosinophils Absolute 0.2 0.0 - 0.5 K/uL   Basophils Relative 1 %   Basophils Absolute 0.0 0.0 - 0.1 K/uL   Immature Granulocytes 0 %   Abs Immature Granulocytes 0.02 0.00 - 0.07 K/uL    Comment: Performed at [facility], [address] [place], [place] [address]  POC CREATINE & ALBUMIN,URINE     Status: Abnormal   Collection Time: 08/06/23 10:24 AM  Result Value Ref Range   Microalbumin Ur, POC 80 mg/L   Creatinine, POC 200 mg/dL   Albumin/Creatinine Ratio, Urine, POC 30-300        Assessment & Plan:   Problem List Items Addressed This Visit       Active Problems   Hyperlipidemia associated with type 2 diabetes mellitus (HCC)     Checking labs today.  Continue current therapy for lipid control. Will modify as needed based on labwork results.       Hypertension associated with diabetes (HCC)    Blood pressure well controlled with current medications.  Continue current therapy.  Will reassess at follow up.       Type 2 diabetes mellitus with diabetic polyneuropathy, without long-term current use of insulin (HCC) - Primary    Checking labs today. Will call pt. With results  Continue current diabetes POC, as patient has been well controlled on current regimen.  Will adjust meds if needed based on labs.        Relevant Orders   POC CREATINE & ALBUMIN,URINE (Completed)   Rectal adenocarcinoma (HCC)    Patient is seen by Oncology, who manage this condition.  He is well controlled with current therapy.   Will defer to them for further changes to plan of care.        Return in about 4 months (around 12/06/2023).   Total time spent: 20 minutes  Miki Kins, FNP  08/06/2023   This document may have been prepared by [facility] Voice Recognition software and as such may include unintentional dictation errors.

## 2023-08-22 NOTE — Assessment & Plan Note (Signed)
Checking labs today.  Continue current therapy for lipid control. Will modify as needed based on labwork results.  

## 2023-08-23 ENCOUNTER — Ambulatory Visit
Admission: RE | Admit: 2023-08-23 | Discharge: 2023-08-23 | Disposition: A | Discharge status: Home / Self Care | Payer: Medicare HMO | Source: Ambulatory Visit | Attending: Radiation Oncology | Admitting: Radiation Oncology

## 2023-08-23 ENCOUNTER — Encounter: Payer: Self-pay | Admitting: Radiation Oncology

## 2023-08-23 VITALS — BP 118/56 | HR 72 | Temp 96.7°F | Resp 16 | Ht 72.0 in | Wt 167.6 lb

## 2023-08-23 DIAGNOSIS — Z933 Colostomy status: Secondary | ICD-10-CM | POA: Diagnosis not present

## 2023-08-23 DIAGNOSIS — Z923 Personal history of irradiation: Secondary | ICD-10-CM | POA: Diagnosis not present

## 2023-08-23 DIAGNOSIS — C20 Malignant neoplasm of rectum: Secondary | ICD-10-CM | POA: Insufficient documentation

## 2023-08-23 DIAGNOSIS — E860 Dehydration: Secondary | ICD-10-CM | POA: Insufficient documentation

## 2023-08-23 NOTE — Progress Notes (Signed)
Radiation Oncology Follow up Note  Name: [name] [name]   Date:   08/23/2023 MRN:  [id number] DOB: Mar 26, 1946    This 77 y.o. male presents to the clinic today for 44-month follow-up status post concurrent chemoradiation therapy for stage IIa (P T3a N0 M0) adenocarcinoma the rectum.  REFERRING PROVIDER: Miki Kins, FNP  HPI: Patient is a 77 year old male now out 6 months having completed both FOLFOX chemotherapy neoadjuvantly as well as concurrent chemoradiation therapy for stage IIa (T3a N0 M0) adenocarcinoma the rectum.  Patient had undergone surgery with margins clear no lymph nodes involved.  He is awaiting colostomy reversal at this time.  He continues to have problems with fluids being dehydrated frequently and feels quite weak and washed out..  Colostomy is functioning well his appetite seems to be slowly improving  COMPLICATIONS OF TREATMENT: none  FOLLOW UP COMPLIANCE: keeps appointments   PHYSICAL EXAM:  BP (!) 118/56   Pulse 72   Temp (!) 96.7 F (35.9 C)   Resp 16   Ht 6' (1.829 m)   Wt 167 lb 9.6 oz (76 kg)   BMI 22.73 kg/m  Patient is a functioning colostomy.  Well-developed well-nourished patient in NAD. HEENT reveals PERLA, EOMI, discs not visualized.  Oral cavity is clear. No oral mucosal lesions are identified. Neck is clear without evidence of cervical or supraclavicular adenopathy. Lungs are clear to A&P. Cardiac examination is essentially unremarkable with regular rate and rhythm without murmur rub or thrill. Abdomen is benign with no organomegaly or masses noted. Motor sensory and DTR levels are equal and symmetric in the upper and lower extremities. Cranial nerves II through XII are grossly intact. Proprioception is intact. No peripheral adenopathy or edema is identified. No motor or sensory levels are noted. Crude visual fields are within normal range.  RADIOLOGY RESULTS: CT scan in July showed minimal edema in the small bowel mesentery with some  possible possible adhesions of the small bowel in the mid pelvis no evidence of recurrent or progressive disease.  PLAN: Present time patient is doing well he is awaiting reversal of his colostomy at this time.  I have asked to see him back in 6 months for follow-up.  Patient knows to call with any concerns.  I would like to take this opportunity to thank you for allowing me to participate in the care of your patient.Carmina Miller, MD

## 2023-08-24 ENCOUNTER — Other Ambulatory Visit: Payer: Self-pay

## 2023-09-13 DIAGNOSIS — Z933 Colostomy status: Secondary | ICD-10-CM | POA: Diagnosis not present

## 2023-09-13 DIAGNOSIS — Z452 Encounter for adjustment and management of vascular access device: Secondary | ICD-10-CM | POA: Diagnosis not present

## 2023-09-22 NOTE — Progress Notes (Signed)
COVID Vaccine Completed:  Date of COVID positive in last 90 days:  PCP - Grayling Congress, FNP Cardiologist - Julien Nordmann, MD LOV 02/08/23  Chest x-ray -  EKG - 05/08/23 Epic Stress Test -  ECHO -  Cardiac Cath -  Pacemaker/ICD device last checked: Spinal Cord Stimulator:  Bowel Prep -   Sleep Study -  CPAP -   Fasting Blood Sugar -  Checks Blood Sugar _____ times a day  Last dose of GLP1 agonist-  N/A GLP1 instructions:  Hold 7 days before surgery    Last dose of SGLT-2 inhibitors-  N/A SGLT-2 instructions:  Hold 3 days before surgery    Blood Thinner Instructions:  Time Aspirin Instructions: ASA 81 Last Dose:  Activity level:  Can go up a flight of stairs and perform activities of daily living without stopping and without symptoms of chest pain or shortness of breath.  Able to exercise without symptoms  Unable to go up a flight of stairs without symptoms of     Anesthesia review: HTN, CAD, DM2, CKD  Patient denies shortness of breath, fever, cough and chest pain at PAT appointment  Patient verbalized understanding of instructions that were given to them at the PAT appointment. Patient was also instructed that they will need to review over the PAT instructions again at home before surgery.

## 2023-09-22 NOTE — Patient Instructions (Signed)
SURGICAL WAITING ROOM VISITATION  Patients having surgery or a procedure may have no more than 2 support people in the waiting area - these visitors may rotate.    Children under the age of 46 must have an adult with them who is not the patient.  Due to an increase in RSV and influenza rates and associated hospitalizations, children ages 13 and under may not visit patients in [facility] hospitals.  If the patient needs to stay at the hospital during part of their recovery, the visitor guidelines for inpatient rooms apply. Pre-op nurse will coordinate an appropriate time for 1 support person to accompany patient in pre-op.  This support person may not rotate.    Please refer to the [facility] website for the visitor guidelines for Inpatients (after your surgery is over and you are in a regular room).    Your procedure is scheduled on: 09/29/23   Report to [facility] Main Entrance    Report to admitting at 6:15 AM   Call this number if you have problems the morning of surgery [phone]   Do not eat food :After Midnight.   After Midnight you may have the following liquids until ______ AM/ PM DAY OF SURGERY  Water Non-Citrus Juices (without pulp, NO RED-Apple, White grape, White cranberry) Black Coffee (NO MILK/CREAM OR CREAMERS, sugar ok)  Clear Tea (NO MILK/CREAM OR CREAMERS, sugar ok) regular and decaf                             Plain Jell-O (NO RED)                                           Fruit ices (not with fruit pulp, NO RED)                                     Popsicles (NO RED)                                                               Sports drinks like Gatorade (NO RED)              Drink 2 Ensure/G2 drinks AT 10:00 PM the night before surgery.        The day of surgery:  Drink ONE (1) Pre-Surgery Clear Ensure or G2 at AM the morning of surgery. Drink in one sitting. Do not sip.  This drink was given to you during your hospital  pre-op appointment  visit. Nothing else to drink after completing the  Pre-Surgery Clear Ensure or G2.          If you have questions, please contact your surgeon's office.   FOLLOW BOWEL PREP AND ANY ADDITIONAL PRE OP INSTRUCTIONS YOU RECEIVED FROM YOUR SURGEON'S OFFICE!!!     Oral Hygiene is also important to reduce your risk of infection.  Remember - BRUSH YOUR TEETH THE MORNING OF SURGERY WITH YOUR REGULAR TOOTHPASTE  DENTURES WILL BE REMOVED PRIOR TO SURGERY PLEASE DO NOT APPLY "Poly grip" OR ADHESIVES!!!   Do NOT smoke after Midnight   Stop all vitamins and herbal supplements 7 days before surgery.   Take these medicines the morning of surgery with A SIP OF WATER:   DO NOT TAKE ANY ORAL DIABETIC MEDICATIONS DAY OF YOUR SURGERY  How to Manage Your Diabetes Before and After Surgery  Why is it important to control my blood sugar before and after surgery? Improving blood sugar levels before and after surgery helps healing and can limit problems. A way of improving blood sugar control is eating a healthy diet by:  Eating less sugar and carbohydrates  Increasing activity/exercise  Talking with your doctor about reaching your blood sugar goals High blood sugars (greater than 180 mg/dL) can raise your risk of infections and slow your recovery, so you will need to focus on controlling your diabetes during the weeks before surgery. Make sure that the doctor who takes care of your diabetes knows about your planned surgery including the date and location.  How do I manage my blood sugar before surgery? Check your blood sugar at least 4 times a day, starting 2 days before surgery, to make sure that the level is not too high or low. Check your blood sugar the morning of your surgery when you wake up and every 2 hours until you get to the Short Stay unit. If your blood sugar is less than 70 mg/dL, you will need to treat for low blood sugar: Do not take insulin. Treat a  low blood sugar (less than 70 mg/dL) with  cup of clear juice (cranberry or apple), 4 glucose tablets, OR glucose gel. Recheck blood sugar in 15 minutes after treatment (to make sure it is greater than 70 mg/dL). If your blood sugar is not greater than 70 mg/dL on recheck, call [phone] for further instructions. Report your blood sugar to the short stay nurse when you get to Short Stay.  If you are admitted to the hospital after surgery: Your blood sugar will be checked by the staff and you will probably be given insulin after surgery (instead of oral diabetes medicines) to make sure you have good blood sugar levels. The goal for blood sugar control after surgery is 80-180 mg/dL.   WHAT DO I DO ABOUT MY DIABETES MEDICATION?  Do not take oral diabetes medicines (pills) the morning of surgery.  THE NIGHT BEFORE SURGERY, take     units of       insulin.       THE MORNING OF SURGERY, take   units of         insulin.  DO NOT TAKE THE FOLLOWING 7 DAYS PRIOR TO SURGERY: Ozempic, Wegovy, Rybelsus (Semaglutide), Byetta (exenatide), Bydureon (exenatide ER), Victoza, Saxenda (liraglutide), or Trulicity (dulaglutide) Mounjaro (Tirzepatide) Adlyxin (Lixisenatide), Polyethylene Glycol Loxenatide.  If your CBG is greater than 220 mg/dL, you may take  of your sliding scale  (correction) dose of insulin.    For patients with insulin pumps: Contact your diabetes doctor for specific instructions before surgery. Decrease basal rates by 20% at midnight the night before your surgery. Note that if your surgery is planned to be longer than 2 hours, your insulin pump will be removed and intravenous (IV) insulin will be started and managed by the nurses and the anesthesiologist. You will be able to restart your  insulin pump once you are awake and able to manage it.  Make sure to bring insulin pump supplies to the hospital with you in case the  site needs to be changed.  Patient Signature:  Date:   Nurse  Signature:  Date:   Reviewed and Endorsed by [facility] Patient Education Committee, August 2015  Bring CPAP mask and tubing day of surgery.                              You may not have any metal on your body including hair pins, jewelry, and body piercing             Do not wear make-up, lotions, powders, perfumes/cologne, or deodorant  Do not wear nail polish including gel and S&S, artificial/acrylic nails, or any other type of covering on natural nails including finger and toenails. If you have artificial nails, gel coating, etc. that needs to be removed by a nail salon please have this removed prior to surgery or surgery may need to be canceled/ delayed if the surgeon/ anesthesia feels like they are unable to be safely monitored.   Do not shave  48 hours prior to surgery.               Men may shave face and neck.   Do not bring valuables to the hospital. [place] IS NOT             RESPONSIBLE   FOR VALUABLES.   Contacts, glasses, dentures or bridgework may not be worn into surgery.   Bring small overnight bag day of surgery.   DO NOT BRING YOUR HOME MEDICATIONS TO THE HOSPITAL. PHARMACY WILL DISPENSE MEDICATIONS LISTED ON YOUR MEDICATION LIST TO YOU DURING YOUR ADMISSION IN THE HOSPITAL!    Patients discharged on the day of surgery will not be allowed to drive home.  Someone NEEDS to stay with you for the first 24 hours after anesthesia.   Special Instructions: Bring a copy of your healthcare power of attorney and living will documents the day of surgery if you haven't scanned them before.              Please read over the following fact sheets you were given: IF YOU HAVE QUESTIONS ABOUT YOUR PRE-OP INSTRUCTIONS PLEASE CALL [phone]   If you received a COVID test during your pre-op visit  it is requested that you wear a mask when out in public, stay away from anyone that may not be feeling well and notify your surgeon if you develop symptoms. If you test positive for  Covid or have been in contact with anyone that has tested positive in the last 10 days please notify you surgeon.    [place] - Preparing for Surgery Before surgery, you can play an important role.  Because skin is not sterile, your skin needs to be as free of germs as possible.  You can reduce the number of germs on your skin by washing with CHG (chlorahexidine gluconate) soap before surgery.  CHG is an antiseptic cleaner which kills germs and bonds with the skin to continue killing germs even after washing. Please DO NOT use if you have an allergy to CHG or antibacterial soaps.  If your skin becomes reddened/irritated stop using the CHG and inform your nurse when you arrive at Short Stay. Do not shave (including legs and underarms) for at least 48 hours prior to the  first CHG shower.  You may shave your face/neck.  Please follow these instructions carefully:  1.  Shower with CHG Soap the night before surgery and the  morning of surgery.  2.  If you choose to wash your hair, wash your hair first as usual with your normal  shampoo.  3.  After you shampoo, rinse your hair and body thoroughly to remove the shampoo.                             4.  Use CHG as you would any other liquid soap.  You can apply chg directly to the skin and wash.  Gently with a scrungie or clean washcloth.  5.  Apply the CHG Soap to your body ONLY FROM THE NECK DOWN.   Do   not use on face/ open                           Wound or open sores. Avoid contact with eyes, ears mouth and   genitals (private parts).                       Wash face,  Genitals (private parts) with your normal soap.             6.  Wash thoroughly, paying special attention to the area where your    surgery  will be performed.  7.  Thoroughly rinse your body with warm water from the neck down.  8.  DO NOT shower/wash with your normal soap after using and rinsing off the CHG Soap.                9.  Pat yourself dry with a clean towel.            10.   Wear clean pajamas.            11.  Place clean sheets on your bed the night of your first shower and do not  sleep with pets. Day of Surgery : Do not apply any lotions/deodorants the morning of surgery.  Please wear clean clothes to the hospital/surgery center.  FAILURE TO FOLLOW THESE INSTRUCTIONS MAY RESULT IN THE CANCELLATION OF YOUR SURGERY  PATIENT SIGNATURE_________________________________  NURSE SIGNATURE__________________________________  ________________________________________________________________________  [name] [name]  An incentive spirometer is a tool that can help keep your lungs clear and active. This tool measures how well you are filling your lungs with each breath. Taking long deep breaths may help reverse or decrease the chance of developing breathing (pulmonary) problems (especially infection) following: A long period of time when you are unable to move or be active. BEFORE THE PROCEDURE  If the spirometer includes an indicator to show your best effort, your nurse or respiratory therapist will set it to a desired goal. If possible, sit up straight or lean slightly forward. Try not to slouch. Hold the incentive spirometer in an upright position. INSTRUCTIONS FOR USE  Sit on the edge of your bed if possible, or sit up as far as you can in bed or on a chair. Hold the incentive spirometer in an upright position. Breathe out normally. Place the mouthpiece in your mouth and seal your lips tightly around it. Breathe in slowly and as deeply as possible, raising the piston or the ball toward the top of the column. Hold your breath for 3-5 seconds or for as  long as possible. Allow the piston or ball to fall to the bottom of the column. Remove the mouthpiece from your mouth and breathe out normally. Rest for a few seconds and repeat Steps 1 through 7 at least 10 times every 1-2 hours when you are awake. Take your time and take a few normal breaths between deep  breaths. The spirometer may include an indicator to show your best effort. Use the indicator as a goal to work toward during each repetition. After each set of 10 deep breaths, practice coughing to be sure your lungs are clear. If you have an incision (the cut made at the time of surgery), support your incision when coughing by placing a pillow or rolled up towels firmly against it. Once you are able to get out of bed, walk around indoors and cough well. You may stop using the incentive spirometer when instructed by your caregiver.  RISKS AND COMPLICATIONS Take your time so you do not get dizzy or light-headed. If you are in pain, you may need to take or ask for pain medication before doing incentive spirometry. It is harder to take a deep breath if you are having pain. AFTER USE Rest and breathe slowly and easily. It can be helpful to keep track of a log of your progress. Your caregiver can provide you with a simple table to help with this. If you are using the spirometer at home, follow these instructions: SEEK MEDICAL CARE IF:  You are having difficultly using the spirometer. You have trouble using the spirometer as often as instructed. Your pain medication is not giving enough relief while using the spirometer. You develop fever of 100.5 F (38.1 C) or higher. SEEK IMMEDIATE MEDICAL CARE IF:  You cough up bloody sputum that had not been present before. You develop fever of 102 F (38.9 C) or greater. You develop worsening pain at or near the incision site. MAKE SURE YOU:  Understand these instructions. Will watch your condition. Will get help right away if you are not doing well or get worse. Document Released: 03/15/2007 Document Revised: 01/25/2012 Document Reviewed: 05/16/2007 [facility] Patient Information 2014 [place], [place].   ________________________________________________________________________

## 2023-09-23 ENCOUNTER — Encounter (HOSPITAL_COMMUNITY): Payer: Self-pay

## 2023-09-23 ENCOUNTER — Encounter (HOSPITAL_COMMUNITY)
Admission: RE | Admit: 2023-09-23 | Discharge: 2023-09-23 | Disposition: A | Discharge status: Home / Self Care | Payer: Medicare HMO | Source: Ambulatory Visit | Attending: Surgery | Admitting: Surgery

## 2023-09-23 ENCOUNTER — Other Ambulatory Visit: Payer: Self-pay

## 2023-09-23 VITALS — BP 123/61 | HR 71 | Temp 98.1°F | Resp 16 | Ht 72.0 in | Wt 174.0 lb

## 2023-09-23 DIAGNOSIS — E1142 Type 2 diabetes mellitus with diabetic polyneuropathy: Secondary | ICD-10-CM | POA: Diagnosis not present

## 2023-09-23 DIAGNOSIS — Z01812 Encounter for preprocedural laboratory examination: Secondary | ICD-10-CM | POA: Insufficient documentation

## 2023-09-23 LAB — CBC
HCT: 33.7 % — ABNORMAL LOW (ref 39.0–52.0)
Hemoglobin: 10.7 g/dL — ABNORMAL LOW (ref 13.0–17.0)
MCH: 31.7 pg (ref 26.0–34.0)
MCHC: 31.8 g/dL (ref 30.0–36.0)
MCV: 99.7 fL (ref 80.0–100.0)
Platelets: 167 10*3/uL (ref 150–400)
RBC: 3.38 MIL/uL — ABNORMAL LOW (ref 4.22–5.81)
RDW: 14.6 % (ref 11.5–15.5)
WBC: 7.9 10*3/uL (ref 4.0–10.5)
nRBC: 0 % (ref 0.0–0.2)

## 2023-09-23 LAB — BASIC METABOLIC PANEL
Anion gap: 9 (ref 5–15)
BUN: 28 mg/dL — ABNORMAL HIGH (ref 8–23)
CO2: 23 mmol/L (ref 22–32)
Calcium: 9.4 mg/dL (ref 8.9–10.3)
Chloride: 108 mmol/L (ref 98–111)
Creatinine, Ser: 1.45 mg/dL — ABNORMAL HIGH (ref 0.61–1.24)
GFR, Estimated: 50 mL/min — ABNORMAL LOW (ref >60)
Glucose, Bld: 59 mg/dL — ABNORMAL LOW (ref 70–99)
Potassium: 4.6 mmol/L (ref 3.5–5.1)
Sodium: 140 mmol/L (ref 135–145)

## 2023-09-23 LAB — HEMOGLOBIN A1C
Hgb A1c MFr Bld: 6.7 % — ABNORMAL HIGH (ref 4.8–5.6)
Mean Plasma Glucose: 145.59 mg/dL

## 2023-09-23 LAB — GLUCOSE, CAPILLARY: Glucose-Capillary: 79 mg/dL (ref 70–99)

## 2023-09-28 NOTE — Anesthesia Preprocedure Evaluation (Signed)
Anesthesia Evaluation  Patient identified by MRN, date of birth, ID band Patient awake    Reviewed: Allergy & Precautions, NPO status , Patient's Chart, lab work & pertinent test results  Airway Mallampati: II  TM Distance: >3 FB Neck ROM: Full    Dental  (+) Edentulous Upper, Missing, Dental Advisory Given   Pulmonary Current Smoker   Pulmonary exam normal breath sounds clear to auscultation       Cardiovascular hypertension, Pt. on medications + angina with exertion + CAD and + Cardiac Stents  Normal cardiovascular exam Rhythm:Regular Rate:Normal  EKG 02/08/23 NSR, non specific IVCD  DES x 1 2009  Cardiology "clearance" noted   Neuro/Psych  PSYCHIATRIC DISORDERS  Depression    Peripheral neuropathy  Neuromuscular disease    GI/Hepatic Neg liver ROS,GERD  Medicated,,Rectal Ca Parastomal hernia S/P colostomy   Endo/Other  diabetes, Well Controlled, Type 2, Oral Hypoglycemic Agents  Hyperlipidemia  Renal/GU CRFRenal diseaseNephrolithiasis     Musculoskeletal negative musculoskeletal ROS (+)    Abdominal   Peds  Hematology  (+) Blood dyscrasia, anemia   Anesthesia Other Findings   Reproductive/Obstetrics                             Anesthesia Physical Anesthesia Plan  ASA: 3  Anesthesia Plan: General   Post-op Pain Management: Tylenol PO (pre-op)* and Gabapentin PO (pre-op)*   Induction: Intravenous  PONV Risk Score and Plan: 2 and Treatment may vary due to age or medical condition, Ondansetron and Dexamethasone  Airway Management Planned: Oral ETT  Additional Equipment:   Intra-op Plan:   Post-operative Plan: Extubation in OR  Informed Consent: I have reviewed the patients History and Physical, chart, labs and discussed the procedure including the risks, benefits and alternatives for the proposed anesthesia with the patient or authorized representative who has indicated  his/her understanding and acceptance.     Dental advisory given  Plan Discussed with: CRNA  Anesthesia Plan Comments:         Anesthesia Quick Evaluation

## 2023-09-29 ENCOUNTER — Other Ambulatory Visit: Payer: Self-pay

## 2023-09-29 ENCOUNTER — Encounter (HOSPITAL_COMMUNITY)
Admission: RE | Disposition: A | Discharge status: Home / Self Care | Payer: Self-pay | Source: Home / Self Care | Attending: Surgery

## 2023-09-29 ENCOUNTER — Inpatient Hospital Stay (HOSPITAL_COMMUNITY): Payer: Self-pay | Admitting: Physician Assistant

## 2023-09-29 ENCOUNTER — Inpatient Hospital Stay (HOSPITAL_COMMUNITY): Payer: Medicare HMO

## 2023-09-29 ENCOUNTER — Inpatient Hospital Stay (HOSPITAL_COMMUNITY)
Admission: RE | Admit: 2023-09-29 | Discharge: 2023-10-01 | DRG: 331 | Disposition: A | Discharge status: Home / Self Care | Payer: Medicare HMO | Attending: Surgery | Admitting: Surgery

## 2023-09-29 ENCOUNTER — Encounter (HOSPITAL_COMMUNITY): Payer: Self-pay | Admitting: Surgery

## 2023-09-29 DIAGNOSIS — I2510 Atherosclerotic heart disease of native coronary artery without angina pectoris: Secondary | ICD-10-CM | POA: Diagnosis present

## 2023-09-29 DIAGNOSIS — Z9889 Other specified postprocedural states: Secondary | ICD-10-CM

## 2023-09-29 DIAGNOSIS — Z720 Tobacco use: Secondary | ICD-10-CM | POA: Diagnosis present

## 2023-09-29 DIAGNOSIS — I129 Hypertensive chronic kidney disease with stage 1 through stage 4 chronic kidney disease, or unspecified chronic kidney disease: Secondary | ICD-10-CM | POA: Diagnosis not present

## 2023-09-29 DIAGNOSIS — D638 Anemia in other chronic diseases classified elsewhere: Secondary | ICD-10-CM | POA: Diagnosis not present

## 2023-09-29 DIAGNOSIS — I152 Hypertension secondary to endocrine disorders: Secondary | ICD-10-CM | POA: Diagnosis present

## 2023-09-29 DIAGNOSIS — F32A Depression, unspecified: Secondary | ICD-10-CM | POA: Diagnosis present

## 2023-09-29 DIAGNOSIS — E785 Hyperlipidemia, unspecified: Secondary | ICD-10-CM | POA: Diagnosis present

## 2023-09-29 DIAGNOSIS — Z9849 Cataract extraction status, unspecified eye: Secondary | ICD-10-CM | POA: Diagnosis not present

## 2023-09-29 DIAGNOSIS — E1142 Type 2 diabetes mellitus with diabetic polyneuropathy: Secondary | ICD-10-CM | POA: Diagnosis not present

## 2023-09-29 DIAGNOSIS — E1169 Type 2 diabetes mellitus with other specified complication: Secondary | ICD-10-CM | POA: Diagnosis not present

## 2023-09-29 DIAGNOSIS — K660 Peritoneal adhesions (postprocedural) (postinfection): Secondary | ICD-10-CM | POA: Diagnosis present

## 2023-09-29 DIAGNOSIS — Z9861 Coronary angioplasty status: Secondary | ICD-10-CM

## 2023-09-29 DIAGNOSIS — Z79899 Other long term (current) drug therapy: Secondary | ICD-10-CM | POA: Diagnosis not present

## 2023-09-29 DIAGNOSIS — Z923 Personal history of irradiation: Secondary | ICD-10-CM

## 2023-09-29 DIAGNOSIS — K219 Gastro-esophageal reflux disease without esophagitis: Secondary | ICD-10-CM | POA: Diagnosis present

## 2023-09-29 DIAGNOSIS — Z8249 Family history of ischemic heart disease and other diseases of the circulatory system: Secondary | ICD-10-CM

## 2023-09-29 DIAGNOSIS — E1122 Type 2 diabetes mellitus with diabetic chronic kidney disease: Secondary | ICD-10-CM | POA: Diagnosis not present

## 2023-09-29 DIAGNOSIS — F17210 Nicotine dependence, cigarettes, uncomplicated: Secondary | ICD-10-CM | POA: Diagnosis present

## 2023-09-29 DIAGNOSIS — Z7982 Long term (current) use of aspirin: Secondary | ICD-10-CM | POA: Diagnosis not present

## 2023-09-29 DIAGNOSIS — N1832 Chronic kidney disease, stage 3b: Secondary | ICD-10-CM | POA: Diagnosis present

## 2023-09-29 DIAGNOSIS — Z933 Colostomy status: Secondary | ICD-10-CM

## 2023-09-29 DIAGNOSIS — Z7984 Long term (current) use of oral hypoglycemic drugs: Secondary | ICD-10-CM | POA: Diagnosis not present

## 2023-09-29 DIAGNOSIS — C20 Malignant neoplasm of rectum: Secondary | ICD-10-CM | POA: Diagnosis not present

## 2023-09-29 DIAGNOSIS — Z9221 Personal history of antineoplastic chemotherapy: Secondary | ICD-10-CM | POA: Diagnosis not present

## 2023-09-29 DIAGNOSIS — Z432 Encounter for attention to ileostomy: Secondary | ICD-10-CM | POA: Diagnosis not present

## 2023-09-29 DIAGNOSIS — E1165 Type 2 diabetes mellitus with hyperglycemia: Secondary | ICD-10-CM | POA: Diagnosis not present

## 2023-09-29 DIAGNOSIS — Z932 Ileostomy status: Secondary | ICD-10-CM | POA: Diagnosis not present

## 2023-09-29 DIAGNOSIS — Z87442 Personal history of urinary calculi: Secondary | ICD-10-CM | POA: Diagnosis not present

## 2023-09-29 HISTORY — PX: ILEOSTOMY CLOSURE: SHX1784

## 2023-09-29 HISTORY — PX: RECTAL EXAM UNDER ANESTHESIA: SHX6399

## 2023-09-29 LAB — GLUCOSE, CAPILLARY
Glucose-Capillary: 116 mg/dL — ABNORMAL HIGH (ref 70–99)
Glucose-Capillary: 131 mg/dL — ABNORMAL HIGH (ref 70–99)
Glucose-Capillary: 144 mg/dL — ABNORMAL HIGH (ref 70–99)

## 2023-09-29 SURGERY — CLOSURE, ILEOSTOMY
Anesthesia: General

## 2023-09-29 MED ORDER — LIDOCAINE HCL 2 % IJ SOLN
INTRAMUSCULAR | Status: AC
  Filled 2023-09-29: qty 20

## 2023-09-29 MED ORDER — ROCURONIUM BROMIDE 100 MG/10ML IV SOLN
INTRAVENOUS | Status: DC | PRN
  Administered 2023-09-29: 60 mg via INTRAVENOUS

## 2023-09-29 MED ORDER — 0.9 % SODIUM CHLORIDE (POUR BTL) OPTIME
TOPICAL | Status: DC | PRN
  Administered 2023-09-29: 1000 mL

## 2023-09-29 MED ORDER — ONDANSETRON HCL 4 MG/2ML IJ SOLN
INTRAMUSCULAR | Status: DC | PRN
  Administered 2023-09-29: 4 mg via INTRAVENOUS

## 2023-09-29 MED ORDER — SIMETHICONE 80 MG PO CHEW: 40.0000 mg | CHEWABLE_TABLET | Freq: Four times a day (QID) | ORAL | Status: DC | PRN

## 2023-09-29 MED ORDER — LACTATED RINGERS IV SOLN: INTRAVENOUS | Status: DC

## 2023-09-29 MED ORDER — SODIUM CHLORIDE 0.9 % IV SOLN
2.0000 g | Freq: Two times a day (BID) | INTRAVENOUS | Status: AC
  Administered 2023-09-29: 2 g via INTRAVENOUS
  Filled 2023-09-29: qty 2

## 2023-09-29 MED ORDER — ESMOLOL HCL 100 MG/10ML IV SOLN
INTRAVENOUS | Status: AC
  Filled 2023-09-29: qty 10

## 2023-09-29 MED ORDER — ENSURE PRE-SURGERY PO LIQD: 296.0000 mL | Freq: Once | ORAL | Status: DC

## 2023-09-29 MED ORDER — ORAL CARE MOUTH RINSE: 15.0000 mL | Freq: Once | OROMUCOSAL | Status: AC

## 2023-09-29 MED ORDER — METHOCARBAMOL 500 MG PO TABS
1000.0000 mg | ORAL_TABLET | Freq: Four times a day (QID) | ORAL | Status: DC | PRN
  Administered 2023-09-29 – 2023-10-01 (×2): 1000 mg via ORAL
  Filled 2023-09-29 (×2): qty 2

## 2023-09-29 MED ORDER — ACETAMINOPHEN 325 MG PO TABS: 325.0000 mg | ORAL_TABLET | ORAL | Status: DC | PRN

## 2023-09-29 MED ORDER — MELATONIN 3 MG PO TABS: 3.0000 mg | ORAL_TABLET | Freq: Every evening | ORAL | Status: DC | PRN

## 2023-09-29 MED ORDER — SALINE SPRAY 0.65 % NA SOLN
1.0000 | Freq: Four times a day (QID) | NASAL | Status: DC | PRN
  Filled 2023-09-29: qty 44

## 2023-09-29 MED ORDER — SODIUM CHLORIDE 0.9 % IV SOLN
2.0000 g | INTRAVENOUS | Status: AC
  Administered 2023-09-29: 2 g via INTRAVENOUS
  Filled 2023-09-29: qty 2

## 2023-09-29 MED ORDER — ALVIMOPAN 12 MG PO CAPS
12.0000 mg | ORAL_CAPSULE | Freq: Two times a day (BID) | ORAL | Status: DC
  Administered 2023-09-30 – 2023-10-01 (×3): 12 mg via ORAL
  Filled 2023-09-29 (×3): qty 1

## 2023-09-29 MED ORDER — ONDANSETRON HCL 4 MG/2ML IJ SOLN: 4.0000 mg | Freq: Four times a day (QID) | INTRAMUSCULAR | Status: DC | PRN

## 2023-09-29 MED ORDER — ACETAMINOPHEN 500 MG PO TABS
1000.0000 mg | ORAL_TABLET | Freq: Once | ORAL | Status: AC
  Administered 2023-09-29: 1000 mg via ORAL
  Filled 2023-09-29: qty 2

## 2023-09-29 MED ORDER — ONDANSETRON HCL 4 MG PO TABS: 4.0000 mg | ORAL_TABLET | Freq: Four times a day (QID) | ORAL | Status: DC | PRN

## 2023-09-29 MED ORDER — CHLORHEXIDINE GLUCONATE CLOTH 2 % EX PADS: 6.0000 | MEDICATED_PAD | Freq: Once | CUTANEOUS | Status: DC

## 2023-09-29 MED ORDER — DIPHENHYDRAMINE HCL 50 MG/ML IJ SOLN
12.5000 mg | Freq: Four times a day (QID) | INTRAMUSCULAR | Status: DC | PRN
Start: 2023-09-29 — End: 2023-10-01

## 2023-09-29 MED ORDER — ENSURE SURGERY PO LIQD
237.0000 mL | Freq: Two times a day (BID) | ORAL | Status: DC
  Administered 2023-09-30 – 2023-10-01 (×3): 237 mL via ORAL

## 2023-09-29 MED ORDER — INSULIN ASPART 100 UNIT/ML IJ SOLN: 0.0000 [IU] | INTRAMUSCULAR | Status: DC | PRN

## 2023-09-29 MED ORDER — LIDOCAINE HCL (PF) 2 % IJ SOLN
INTRAMUSCULAR | Status: DC | PRN
  Administered 2023-09-29: 1.5 mg/kg/h via INTRADERMAL

## 2023-09-29 MED ORDER — ROCURONIUM BROMIDE 10 MG/ML (PF) SYRINGE
PREFILLED_SYRINGE | INTRAVENOUS | Status: AC
  Filled 2023-09-29: qty 10

## 2023-09-29 MED ORDER — ONDANSETRON HCL 4 MG/2ML IJ SOLN
INTRAMUSCULAR | Status: AC
Start: 2023-09-29 — End: ?
  Filled 2023-09-29: qty 2

## 2023-09-29 MED ORDER — TRAMADOL HCL 50 MG PO TABS
50.0000 mg | ORAL_TABLET | Freq: Four times a day (QID) | ORAL | Status: DC | PRN
  Administered 2023-09-29 – 2023-10-01 (×5): 100 mg via ORAL
  Filled 2023-09-29 (×5): qty 2

## 2023-09-29 MED ORDER — ENOXAPARIN SODIUM 40 MG/0.4ML IJ SOSY
40.0000 mg | PREFILLED_SYRINGE | INTRAMUSCULAR | Status: DC
  Administered 2023-09-30 – 2023-10-01 (×2): 40 mg via SUBCUTANEOUS
  Filled 2023-09-29 (×2): qty 0.4

## 2023-09-29 MED ORDER — BUPIVACAINE LIPOSOME 1.3 % IJ SUSP
INTRAMUSCULAR | Status: AC
Start: 2023-09-29 — End: ?
  Filled 2023-09-29: qty 20

## 2023-09-29 MED ORDER — KCL IN DEXTROSE-NACL 20-5-0.45 MEQ/L-%-% IV SOLN
INTRAVENOUS | Status: DC
  Filled 2023-09-29: qty 1000

## 2023-09-29 MED ORDER — SODIUM CHLORIDE 0.9% FLUSH: 3.0000 mL | INTRAVENOUS | Status: DC | PRN

## 2023-09-29 MED ORDER — FENTANYL CITRATE (PF) 100 MCG/2ML IJ SOLN
INTRAMUSCULAR | Status: DC | PRN
  Administered 2023-09-29 (×3): 50 ug via INTRAVENOUS

## 2023-09-29 MED ORDER — PHENOL 1.4 % MT LIQD: 2.0000 | OROMUCOSAL | Status: DC | PRN

## 2023-09-29 MED ORDER — BISMUTH SUBSALICYLATE 262 MG/15ML PO SUSP
30.0000 mL | Freq: Three times a day (TID) | ORAL | Status: DC | PRN
  Filled 2023-09-29: qty 236

## 2023-09-29 MED ORDER — BUPIVACAINE-EPINEPHRINE (PF) 0.25% -1:200000 IJ SOLN
INTRAMUSCULAR | Status: DC | PRN
  Administered 2023-09-29: 50 mL

## 2023-09-29 MED ORDER — CALCIUM POLYCARBOPHIL 625 MG PO TABS
625.0000 mg | ORAL_TABLET | Freq: Two times a day (BID) | ORAL | Status: DC
  Administered 2023-09-29 – 2023-10-01 (×4): 625 mg via ORAL
  Filled 2023-09-29 (×4): qty 1

## 2023-09-29 MED ORDER — ALUM & MAG HYDROXIDE-SIMETH 200-200-20 MG/5ML PO SUSP: 30.0000 mL | Freq: Four times a day (QID) | ORAL | Status: DC | PRN

## 2023-09-29 MED ORDER — ESMOLOL HCL 100 MG/10ML IV SOLN
INTRAVENOUS | Status: DC | PRN
  Administered 2023-09-29: 5 mg via INTRAVENOUS

## 2023-09-29 MED ORDER — OXYCODONE HCL 5 MG/5ML PO SOLN: 5.0000 mg | Freq: Once | ORAL | Status: AC | PRN

## 2023-09-29 MED ORDER — NAPHAZOLINE-GLYCERIN 0.012-0.25 % OP SOLN
1.0000 [drp] | Freq: Four times a day (QID) | OPHTHALMIC | Status: DC | PRN
  Filled 2023-09-29: qty 15

## 2023-09-29 MED ORDER — CHLORHEXIDINE GLUCONATE 0.12 % MT SOLN
15.0000 mL | Freq: Once | OROMUCOSAL | Status: AC
Start: 2023-09-29 — End: 2023-09-29
  Administered 2023-09-29: 15 mL via OROMUCOSAL

## 2023-09-29 MED ORDER — ENOXAPARIN SODIUM 40 MG/0.4ML IJ SOSY
40.0000 mg | PREFILLED_SYRINGE | Freq: Once | INTRAMUSCULAR | Status: AC
Start: 2023-09-29 — End: 2023-09-29
  Administered 2023-09-29: 40 mg via SUBCUTANEOUS
  Filled 2023-09-29: qty 0.4

## 2023-09-29 MED ORDER — ASPIRIN 81 MG PO TBEC
81.0000 mg | DELAYED_RELEASE_TABLET | Freq: Every day | ORAL | Status: DC
  Administered 2023-09-30 – 2023-10-01 (×2): 81 mg via ORAL
  Filled 2023-09-29 (×2): qty 1

## 2023-09-29 MED ORDER — ENSURE PRE-SURGERY PO LIQD: 592.0000 mL | Freq: Once | ORAL | Status: DC

## 2023-09-29 MED ORDER — KETAMINE HCL 50 MG/5ML IJ SOSY
PREFILLED_SYRINGE | INTRAMUSCULAR | Status: AC
  Filled 2023-09-29: qty 5

## 2023-09-29 MED ORDER — NITROGLYCERIN 0.4 MG SL SUBL: 0.4000 mg | SUBLINGUAL_TABLET | SUBLINGUAL | Status: DC | PRN

## 2023-09-29 MED ORDER — DIPHENHYDRAMINE HCL 12.5 MG/5ML PO ELIX: 12.5000 mg | ORAL_SOLUTION | Freq: Four times a day (QID) | ORAL | Status: DC | PRN

## 2023-09-29 MED ORDER — OXYCODONE HCL 5 MG PO TABS: 5.0000 mg | ORAL_TABLET | Freq: Once | ORAL | Status: AC | PRN

## 2023-09-29 MED ORDER — FAMOTIDINE 20 MG PO TABS
40.0000 mg | ORAL_TABLET | Freq: Every day | ORAL | Status: DC
  Administered 2023-09-30 – 2023-10-01 (×2): 40 mg via ORAL
  Filled 2023-09-29 (×2): qty 2

## 2023-09-29 MED ORDER — FENTANYL CITRATE PF 50 MCG/ML IJ SOSY: 25.0000 ug | PREFILLED_SYRINGE | INTRAMUSCULAR | Status: DC | PRN

## 2023-09-29 MED ORDER — FENTANYL CITRATE (PF) 100 MCG/2ML IJ SOLN
INTRAMUSCULAR | Status: AC
  Filled 2023-09-29: qty 2

## 2023-09-29 MED ORDER — PROPOFOL 10 MG/ML IV BOLUS
INTRAVENOUS | Status: DC | PRN
  Administered 2023-09-29: 120 mg via INTRAVENOUS

## 2023-09-29 MED ORDER — ONDANSETRON HCL 4 MG/2ML IJ SOLN: 4.0000 mg | Freq: Once | INTRAMUSCULAR | Status: DC | PRN

## 2023-09-29 MED ORDER — DEXAMETHASONE SODIUM PHOSPHATE 10 MG/ML IJ SOLN
INTRAMUSCULAR | Status: AC
  Filled 2023-09-29: qty 1

## 2023-09-29 MED ORDER — PROPOFOL 10 MG/ML IV BOLUS
INTRAVENOUS | Status: AC
  Filled 2023-09-29: qty 20

## 2023-09-29 MED ORDER — MAGIC MOUTHWASH
15.0000 mL | Freq: Four times a day (QID) | ORAL | Status: DC | PRN
  Filled 2023-09-29: qty 15

## 2023-09-29 MED ORDER — KETAMINE HCL 50 MG/5ML IJ SOSY
PREFILLED_SYRINGE | INTRAMUSCULAR | Status: DC | PRN
  Administered 2023-09-29: 30 mg via INTRAVENOUS
  Administered 2023-09-29: 10 mg via INTRAVENOUS

## 2023-09-29 MED ORDER — ALVIMOPAN 12 MG PO CAPS
12.0000 mg | ORAL_CAPSULE | ORAL | Status: AC
  Administered 2023-09-29: 12 mg via ORAL
  Filled 2023-09-29: qty 1

## 2023-09-29 MED ORDER — PROCHLORPERAZINE MALEATE 10 MG PO TABS
10.0000 mg | ORAL_TABLET | Freq: Four times a day (QID) | ORAL | Status: DC | PRN
Start: 2023-09-29 — End: 2023-10-01

## 2023-09-29 MED ORDER — BUPIVACAINE LIPOSOME 1.3 % IJ SUSP: 20.0000 mL | Freq: Once | INTRAMUSCULAR | Status: DC

## 2023-09-29 MED ORDER — MIDAZOLAM HCL 5 MG/5ML IJ SOLN
INTRAMUSCULAR | Status: DC | PRN
  Administered 2023-09-29: 1 mg via INTRAVENOUS

## 2023-09-29 MED ORDER — MENTHOL 3 MG MT LOZG: 1.0000 | LOZENGE | OROMUCOSAL | Status: DC | PRN

## 2023-09-29 MED ORDER — LIDOCAINE HCL (CARDIAC) PF 100 MG/5ML IV SOSY
PREFILLED_SYRINGE | INTRAVENOUS | Status: DC | PRN
  Administered 2023-09-29: 100 mg via INTRAVENOUS

## 2023-09-29 MED ORDER — DEXAMETHASONE SODIUM PHOSPHATE 10 MG/ML IJ SOLN
INTRAMUSCULAR | Status: DC | PRN
  Administered 2023-09-29: 4 mg via INTRAVENOUS

## 2023-09-29 MED ORDER — ISOSORBIDE MONONITRATE ER 30 MG PO TB24
30.0000 mg | ORAL_TABLET | Freq: Every day | ORAL | Status: DC
  Administered 2023-09-30 – 2023-10-01 (×2): 30 mg via ORAL
  Filled 2023-09-29 (×2): qty 1

## 2023-09-29 MED ORDER — BUPIVACAINE LIPOSOME 1.3 % IJ SUSP
INTRAMUSCULAR | Status: DC | PRN
  Administered 2023-09-29: 20 mL

## 2023-09-29 MED ORDER — ACETAMINOPHEN 500 MG PO TABS
1000.0000 mg | ORAL_TABLET | Freq: Four times a day (QID) | ORAL | Status: DC
  Administered 2023-09-29 – 2023-10-01 (×8): 1000 mg via ORAL
  Filled 2023-09-29 (×8): qty 2

## 2023-09-29 MED ORDER — SODIUM CHLORIDE 0.9% FLUSH
3.0000 mL | Freq: Two times a day (BID) | INTRAVENOUS | Status: DC
  Administered 2023-09-30 – 2023-10-01 (×3): 3 mL via INTRAVENOUS

## 2023-09-29 MED ORDER — ACETAMINOPHEN 500 MG PO TABS: 1000.0000 mg | ORAL_TABLET | ORAL | Status: DC

## 2023-09-29 MED ORDER — FENTANYL CITRATE PF 50 MCG/ML IJ SOSY
PREFILLED_SYRINGE | INTRAMUSCULAR | Status: AC
  Filled 2023-09-29: qty 1

## 2023-09-29 MED ORDER — LACTATED RINGERS IV SOLN: Freq: Three times a day (TID) | INTRAVENOUS | Status: DC | PRN

## 2023-09-29 MED ORDER — SODIUM CHLORIDE 0.9 % IV SOLN: 250.0000 mL | INTRAVENOUS | Status: DC | PRN

## 2023-09-29 MED ORDER — GLIMEPIRIDE 4 MG PO TABS
4.0000 mg | ORAL_TABLET | Freq: Every day | ORAL | Status: DC
  Administered 2023-09-30 – 2023-10-01 (×2): 4 mg via ORAL
  Filled 2023-09-29 (×2): qty 1

## 2023-09-29 MED ORDER — PROCHLORPERAZINE EDISYLATE 10 MG/2ML IJ SOLN: 5.0000 mg | Freq: Four times a day (QID) | INTRAMUSCULAR | Status: DC | PRN

## 2023-09-29 MED ORDER — OXYCODONE HCL 5 MG PO TABS
ORAL_TABLET | ORAL | Status: AC
  Administered 2023-09-29: 5 mg via ORAL
  Filled 2023-09-29: qty 1

## 2023-09-29 MED ORDER — GABAPENTIN 300 MG PO CAPS
300.0000 mg | ORAL_CAPSULE | ORAL | Status: AC
  Administered 2023-09-29: 300 mg via ORAL
  Filled 2023-09-29: qty 1

## 2023-09-29 MED ORDER — METHOCARBAMOL 1000 MG/10ML IJ SOLN: 1000.0000 mg | Freq: Four times a day (QID) | INTRAMUSCULAR | Status: DC | PRN

## 2023-09-29 MED ORDER — METOPROLOL TARTRATE 5 MG/5ML IV SOLN: 5.0000 mg | Freq: Four times a day (QID) | INTRAVENOUS | Status: DC | PRN

## 2023-09-29 MED ORDER — PIOGLITAZONE HCL 15 MG PO TABS
15.0000 mg | ORAL_TABLET | Freq: Every day | ORAL | Status: DC
  Administered 2023-09-30 – 2023-10-01 (×2): 15 mg via ORAL
  Filled 2023-09-29 (×2): qty 1

## 2023-09-29 MED ORDER — MIDAZOLAM HCL 2 MG/2ML IJ SOLN
INTRAMUSCULAR | Status: AC
  Filled 2023-09-29: qty 2

## 2023-09-29 MED ORDER — HYDRALAZINE HCL 20 MG/ML IJ SOLN: 10.0000 mg | INTRAMUSCULAR | Status: DC | PRN

## 2023-09-29 MED ORDER — ACETAMINOPHEN 160 MG/5ML PO SOLN: 325.0000 mg | ORAL | Status: DC | PRN

## 2023-09-29 MED ORDER — HYDROMORPHONE HCL 1 MG/ML IJ SOLN: 0.5000 mg | INTRAMUSCULAR | Status: DC | PRN

## 2023-09-29 MED ORDER — MEPERIDINE HCL 50 MG/ML IJ SOLN: 6.2500 mg | INTRAMUSCULAR | Status: DC | PRN

## 2023-09-29 MED ORDER — BUPIVACAINE-EPINEPHRINE 0.25% -1:200000 IJ SOLN
INTRAMUSCULAR | Status: AC
  Filled 2023-09-29: qty 1

## 2023-09-29 MED ORDER — LIDOCAINE HCL (PF) 2 % IJ SOLN
INTRAMUSCULAR | Status: AC
Start: 2023-09-29 — End: ?
  Filled 2023-09-29: qty 5

## 2023-09-29 MED ORDER — SUGAMMADEX SODIUM 200 MG/2ML IV SOLN
INTRAVENOUS | Status: DC | PRN
  Administered 2023-09-29: 310 mg via INTRAVENOUS

## 2023-09-29 MED ORDER — ATORVASTATIN CALCIUM 20 MG PO TABS
40.0000 mg | ORAL_TABLET | Freq: Every day | ORAL | Status: DC
  Administered 2023-09-30 – 2023-10-01 (×2): 40 mg via ORAL
  Filled 2023-09-29 (×2): qty 2

## 2023-09-29 SURGICAL SUPPLY — 72 items
BAG COUNTER SPONGE SURGICOUNT (BAG) IMPLANT
BENZOIN TINCTURE PRP APPL 2/3 (GAUZE/BANDAGES/DRESSINGS) ×1 IMPLANT
BLADE SURG 15 STRL LF DISP TIS (BLADE) IMPLANT
BLADE SURG 15 STRL SS (BLADE)
BLADE SURG SZ10 CARB STEEL (BLADE) ×1 IMPLANT
BRIEF MESH DISP LRG (UNDERPADS AND DIAPERS) ×1 IMPLANT
CHLORAPREP W/TINT 26 (MISCELLANEOUS) ×1 IMPLANT
CNTNR URN SCR LID CUP LEK RST (MISCELLANEOUS) ×1 IMPLANT
CONT SPEC 4OZ STRL OR WHT (MISCELLANEOUS) ×1
COVER MAYO STAND STRL (DRAPES) ×1 IMPLANT
COVER SURGICAL LIGHT HANDLE (MISCELLANEOUS) ×1 IMPLANT
DRAIN CHANNEL 19F RND (DRAIN) IMPLANT
DRAPE LAPAROSCOPIC ABDOMINAL (DRAPES) ×1 IMPLANT
DRAPE LAPAROTOMY T 102X78X121 (DRAPES) ×1 IMPLANT
DRAPE SHEET LG 3/4 BI-LAMINATE (DRAPES) IMPLANT
DRAPE UTILITY XL STRL (DRAPES) ×1 IMPLANT
DRAPE WARM FLUID 44X44 (DRAPES) ×1 IMPLANT
DRSG OPSITE POSTOP 3X4 (GAUZE/BANDAGES/DRESSINGS) IMPLANT
DRSG OPSITE POSTOP 4X10 (GAUZE/BANDAGES/DRESSINGS) IMPLANT
DRSG OPSITE POSTOP 4X6 (GAUZE/BANDAGES/DRESSINGS) IMPLANT
DRSG OPSITE POSTOP 4X8 (GAUZE/BANDAGES/DRESSINGS) IMPLANT
DRSG TEGADERM 2-3/8X2-3/4 SM (GAUZE/BANDAGES/DRESSINGS) ×1 IMPLANT
DRSG TEGADERM 4X4.75 (GAUZE/BANDAGES/DRESSINGS) ×1 IMPLANT
ELECT REM PT RETURN 15FT ADLT (MISCELLANEOUS) ×1 IMPLANT
GAUZE 4X4 16PLY ~~LOC~~+RFID DBL (SPONGE) ×1 IMPLANT
GAUZE PACKING IODOFORM 1/4X15 (PACKING) IMPLANT
GAUZE PAD ABD 8X10 STRL (GAUZE/BANDAGES/DRESSINGS) IMPLANT
GAUZE SPONGE 4X4 12PLY STRL (GAUZE/BANDAGES/DRESSINGS) ×1 IMPLANT
GLOVE ECLIPSE 8.0 STRL XLNG CF (GLOVE) ×1 IMPLANT
GLOVE INDICATOR 8.0 STRL GRN (GLOVE) ×1 IMPLANT
GOWN STRL REUS W/ TWL XL LVL3 (GOWN DISPOSABLE) ×4 IMPLANT
GOWN STRL REUS W/TWL XL LVL3 (GOWN DISPOSABLE) ×4
HANDLE SUCTION POOLE (INSTRUMENTS) ×1 IMPLANT
KIT BASIN OR (CUSTOM PROCEDURE TRAY) ×1 IMPLANT
KIT TURNOVER KIT A (KITS) IMPLANT
LEGGING LITHOTOMY PAIR STRL (DRAPES) ×1 IMPLANT
LOOP VESSEL MAXI BLUE (MISCELLANEOUS) IMPLANT
NDL HYPO 22X1.5 SAFETY MO (MISCELLANEOUS) ×1 IMPLANT
NEEDLE HYPO 22X1.5 SAFETY MO (MISCELLANEOUS) ×1
PACK BASIC VI WITH GOWN DISP (CUSTOM PROCEDURE TRAY) ×1 IMPLANT
PACK GENERAL/GYN (CUSTOM PROCEDURE TRAY) ×1 IMPLANT
PENCIL BUTTON HOLSTER BLD 10FT (ELECTRODE) ×1 IMPLANT
RELOAD PROXIMATE 75MM BLUE (ENDOMECHANICALS) ×2
RELOAD STAPLE 75 3.8 BLU REG (ENDOMECHANICALS) IMPLANT
RETRACTOR WND ALEXIS 18 MED (MISCELLANEOUS) IMPLANT
RTRCTR WOUND ALEXIS 18CM MED (MISCELLANEOUS) ×1
SCRUB CHG 4% DYNA-HEX 4OZ (MISCELLANEOUS) ×1 IMPLANT
SPIKE FLUID TRANSFER (MISCELLANEOUS) ×1 IMPLANT
STAPLER 90 3.5 STAND SLIM (STAPLE) ×1
STAPLER 90 3.5 STD SLIM (STAPLE) IMPLANT
STAPLER PROXIMATE 75MM BLUE (STAPLE) IMPLANT
STAPLER SKIN PROX WIDE 3.9 (STAPLE) ×1 IMPLANT
SUCTION POOLE HANDLE (INSTRUMENTS) ×1
SURGILUBE 2OZ TUBE FLIPTOP (MISCELLANEOUS) ×1 IMPLANT
SUT CHROMIC 2 0 SH (SUTURE) ×1 IMPLANT
SUT MNCRL AB 4-0 PS2 18 (SUTURE) ×1 IMPLANT
SUT PDS AB 1 CT1 27 (SUTURE) IMPLANT
SUT PDS AB 1 TP1 96 (SUTURE) ×2 IMPLANT
SUT SILK 2 0 (SUTURE) ×1
SUT SILK 2 0 SH CR/8 (SUTURE) ×1 IMPLANT
SUT SILK 2-0 18XBRD TIE 12 (SUTURE) IMPLANT
SUT VIC AB 2-0 SH 18 (SUTURE) IMPLANT
SUT VIC AB 2-0 UR6 27 (SUTURE) ×6 IMPLANT
SUT VICRYL 0 UR6 27IN ABS (SUTURE) ×1 IMPLANT
SYR 20ML LL LF (SYRINGE) ×1 IMPLANT
SYR 3ML LL SCALE MARK (SYRINGE) IMPLANT
SYR BULB IRRIG 60ML STRL (SYRINGE) ×1 IMPLANT
TAPE UMBILICAL 1/8 X36 TWILL (MISCELLANEOUS) ×1 IMPLANT
TOWEL OR 17X26 10 PK STRL BLUE (TOWEL DISPOSABLE) ×2 IMPLANT
TOWEL OR NON WOVEN STRL DISP B (DISPOSABLE) ×2 IMPLANT
TRAY FOLEY MTR SLVR 16FR STAT (SET/KITS/TRAYS/PACK) IMPLANT
YANKAUER SUCT BULB TIP 10FT TU (MISCELLANEOUS) ×1 IMPLANT

## 2023-09-29 NOTE — Transfer of Care (Signed)
Immediate Anesthesia Transfer of Care Note  Patient: [name] [name]  Procedure(s) Performed: OPEN TAKEDOWN OF LOOP ILEOSTOMY, LEFT TAP BLOCK RECTAL EXAM UNDER ANESTHESIA  Patient Location: PACU  Anesthesia Type:General  Level of Consciousness: drowsy  Airway & Oxygen Therapy: Patient Spontanous Breathing and Patient connected to nasal cannula oxygen  Post-op Assessment: Report given to RN and Post -op Vital signs reviewed and stable  Post vital signs: Reviewed and stable  Last Vitals:  Vitals Value Taken Time  BP 140/68 09/29/23 1012  Temp    Pulse 67 09/29/23 1014  Resp 18 09/29/23 1014  SpO2 100 % 09/29/23 1014  Vitals shown include unfiled device data.  Last Pain:  Vitals:   09/29/23 0659  TempSrc: Oral         Complications: No notable events documented.

## 2023-09-29 NOTE — Discharge Instructions (Signed)
SURGERY: POST OP INSTRUCTIONS (Surgery for small bowel obstruction, colon resection, etc)   ######################################################################  EAT Gradually transition to a high fiber diet with a fiber supplement over the next few days after discharge  WALK Walk an hour a day.  Control your pain to do that.    CONTROL PAIN Control pain so that you can walk, sleep, tolerate sneezing/coughing, go up/down stairs.  HAVE A BOWEL MOVEMENT DAILY Keep your bowels regular to avoid problems.  OK to try a laxative to override constipation.  OK to use an antidairrheal to slow down diarrhea.  Call if not better after 2 tries  CALL IF YOU HAVE PROBLEMS/CONCERNS Call if you are still struggling despite following these instructions. Call if you have concerns not answered by these instructions  ######################################################################   DIET Follow a light diet the first few days at home.  Start with a bland diet such as soups, liquids, starchy foods, low fat foods, etc.  If you feel full, bloated, or constipated, stay on a ful liquid or pureed/blenderized diet for a few days until you feel better and no longer constipated. Be sure to drink plenty of fluids every day to avoid getting dehydrated (feeling dizzy, not urinating, etc.). Gradually add a fiber supplement to your diet over the next week.  Gradually get back to a regular solid diet.  Avoid fast food or heavy meals the first week as you are more likely to get nauseated. It is expected for your digestive tract to need a few months to get back to normal.  It is common for your bowel movements and stools to be irregular.  You will have occasional bloating and cramping that should eventually fade away.  Until you are eating solid food normally, off all pain medications, and back to regular activities; your bowels will not be normal. Focus on eating a low-fat, high fiber diet the rest of your life  (See Getting to Good Bowel Health, below).  CARE of your INCISION or WOUND  It is good for closed incisions and even open wounds to be washed every day.  Shower every day.  Short baths are fine.  Wash the incisions and wounds clean with soap & water.    You may leave closed incisions open to air if it is dry.   You may cover the incision with clean gauze & replace it after your daily shower for comfort.  TEGADERM & WICKS:  You have clear gauze band-aid dressings over your closed incision(s).  Remove the dressings & shoelace ribbon wicks in your largest incision 3 days after surgery.    If you have an open wound with a wound vac, see wound vac care instructions.    ACTIVITIES as tolerated Start light daily activities --- self-care, walking, climbing stairs-- beginning the day after surgery.  Gradually increase activities as tolerated.  Control your pain to be active.  Stop when you are tired.  Ideally, walk several times a day, eventually an hour a day.   Most people are back to most day-to-day activities in a few weeks.  It takes 4-8 weeks to get back to unrestricted, intense activity. If you can walk 30 minutes without difficulty, it is safe to try more intense activity such as jogging, treadmill, bicycling, low-impact aerobics, swimming, etc. Save the most intensive and strenuous activity for last (Usually 4-8 weeks after surgery) such as sit-ups, heavy lifting, contact sports, etc.  Refrain from any intense heavy lifting or straining until you are off narcotics  for pain control.  You will have off days, but things should improve week-by-week. DO NOT PUSH THROUGH PAIN.  Let pain be your guide: If it hurts to do something, don't do it.  Pain is your body warning you to avoid that activity for another week until the pain goes down. You may drive when you are no longer taking narcotic prescription pain medication, you can comfortably wear a seatbelt, and you can safely make sudden turns/stops to  protect yourself without hesitating due to pain. You may have sexual intercourse when it is comfortable. If it hurts to do something, stop.   MEDICATIONS Take your usually prescribed home medications unless otherwise directed.   Blood thinners:  You can restart any strong blood thinners after the second postoperative day.  It is OK to continue aspirin before & after surgery..    Some blood in BMs the first 1-2 weeks is common but should taper down & be small volume.  If you are passing many large clots, call your surgeon    PAIN CONTROL Pain after surgery or related to activity is often due to strain/injury to muscle, tendon, nerves and/or incisions.  This pain is usually short-term and will improve in a few months.  To help speed the process of healing and to get back to regular activity more quickly, DO THE FOLLOWING THINGS TOGETHER: Increase activity gradually.  DO NOT PUSH THROUGH PAIN Use Ice and/or Heat Try Gentle Massage and/or Stretching Take over the counter pain medication Take Narcotic prescription pain medication for more severe pain  Good pain control = faster recovery.  It is better to take more medicine to be more active than to stay in bed all day to avoid medications.  Increase activity gradually Avoid heavy lifting at first, then increase to lifting as tolerated over the next 6 weeks. Do not "push through" the pain.  Listen to your body and avoid positions and maneuvers than reproduce the pain.  Wait a few days before trying something more intense Walking an hour a day is encouraged to help your body recover faster and more safely.  Start slowly and stop when getting sore.  If you can walk 30 minutes without stopping or pain, you can try more intense activity (running, jogging, aerobics, cycling, swimming, treadmill, sex, sports, weightlifting, etc.) Remember: If it hurts to do it, then don't do it! Use Ice and/or Heat You will have swelling and bruising around the  incisions.  This will take several weeks to resolve. Ice packs or heating pads (6-8 times a day, 30-60 minutes at a time) will help sooth soreness & bruising. Some people prefer to use ice alone, heat alone, or alternate between ice & heat.  Experiment and see what works best for you.  Consider trying ice for the first few days to help decrease swelling and bruising; then, switch to heat to help relax sore spots and speed recovery. Shower every day.  Short baths are fine.  It feels good!  Keep the incisions and wounds clean with soap & water.   Try Gentle Massage and/or Stretching Massage at the area of pain many times a day Stop if you feel pain - do not overdo it Take over the counter pain medication This helps the muscle and nerve tissues become less irritable and calm down faster Choose ONE of the following over-the-counter anti-inflammatory medications: Acetaminophen 500mg  tabs (Tylenol) 1-2 pills with every meal and just before bedtime (avoid if you have liver problems or if  you have acetaminophen in you narcotic prescription) Naproxen 220mg  tabs (ex. Aleve, Naprosyn) 1-2 pills twice a day (avoid if you have kidney, stomach, IBD, or bleeding problems) Ibuprofen 200mg  tabs (ex. Advil, Motrin) 3-4 pills with every meal and just before bedtime (avoid if you have kidney, stomach, IBD, or bleeding problems) Take with food/snack several times a day as directed for at least 2 weeks to help keep pain / soreness down & more manageable. Take Narcotic prescription pain medication for more severe pain A prescription for strong pain control is often given to you upon discharge (for example: oxycodone/Percocet, hydrocodone/Norco/Vicodin, or tramadol/Ultram) Take your pain medication as prescribed. Be mindful that most narcotic prescriptions contain Tylenol (acetaminophen) as well - avoid taking too much Tylenol. If you are having problems/concerns with the prescription medicine (does not control pain,  nausea, vomiting, rash, itching, etc.), please call us [phone] to see if we need to switch you to a different pain medicine that will work better for you and/or control your side effects better. If you need a refill on your pain medication, you must call the office before 4 pm and on weekdays only.  By federal law, prescriptions for narcotics cannot be called into a pharmacy.  They must be filled out on paper & picked up from our office by the patient or authorized caretaker.  Prescriptions cannot be filled after 4 pm nor on weekends.    WHEN TO CALL us [phone] Severe uncontrolled or worsening pain  Fever over 101 F (38.5 C) Concerns with the incision: Worsening pain, redness, rash/hives, swelling, bleeding, or drainage Reactions / problems with new medications (itching, rash, hives, nausea, etc.) Nausea and/or vomiting Difficulty urinating Difficulty breathing Worsening fatigue, dizziness, lightheadedness, blurred vision Other concerns If you are not getting better after two weeks or are noticing you are getting worse, contact our office [phone] for further advice.  We may need to adjust your medications, re-evaluate you in the office, send you to the emergency room, or see what other things we can do to help. The clinic staff is available to answer your questions during regular business hours (8:30am-5pm).  Please don't hesitate to call and ask to speak to one of our nurses for clinical concerns.    A surgeon from [facility] is always on call at the hospitals 24 hours/day If you have a medical emergency, go to the nearest emergency room or call 911.  FOLLOW UP in our office One the day of your discharge from the hospital (or the next business weekday), please call [facility] to set up or confirm an appointment to see your surgeon in the office for a follow-up appointment.  Usually it is 2-3 weeks after your surgery.   If you have skin staples at  your incision(s), let the office know so we can set up a time in the office for the nurse to remove them (usually around 10 days after surgery). Make sure that you call for appointments the day of discharge (or the next business weekday) from the hospital to ensure a convenient appointment time. IF YOU HAVE DISABILITY OR FAMILY LEAVE FORMS, BRING THEM TO THE OFFICE FOR PROCESSING.  DO NOT GIVE THEM TO YOUR DOCTOR.  [facility] [address], [address], [place], [place]  [address] ? [phone] - Main [phone] - Toll Free,  [phone] - Fax [email or web address]    GETTING TO GOOD BOWEL HEALTH. It is expected for your digestive  tract to need a few months to get back to normal.  It is common for your bowel movements and stools to be irregular.  You will have occasional bloating and cramping that should eventually fade away.  Until you are eating solid food normally, off all pain medications, and back to regular activities; your bowels will not be normal.   Avoiding constipation The goal: ONE SOFT BOWEL MOVEMENT A DAY!    Drink plenty of fluids.  Choose water first. TAKE A FIBER SUPPLEMENT EVERY DAY THE REST OF YOUR LIFE During your first week back home, gradually add back a fiber supplement every day Experiment which form you can tolerate.   There are many forms such as powders, tablets, wafers, gummies, etc Psyllium bran (Metamucil), methylcellulose (Citrucel), Miralax or Glycolax, Benefiber, Flax Seed.  Adjust the dose week-by-week (1/2 dose/day to 6 doses a day) until you are moving your bowels 1-2 times a day.  Cut back the dose or try a different fiber product if it is giving you problems such as diarrhea or bloating. Sometimes a laxative is needed to help jump-start bowels if constipated until the fiber supplement can help regulate your bowels.  If you are tolerating eating & you are farting, it is okay to try a gentle laxative such as double dose  MiraLax, prune juice, or Milk of Magnesia.  Avoid using laxatives too often. Stool softeners can sometimes help counteract the constipating effects of narcotic pain medicines.  It can also cause diarrhea, so avoid using for too long. If you are still constipated despite taking fiber daily, eating solids, and a few doses of laxatives, call our office. Controlling diarrhea Try drinking liquids and eating bland foods for a few days to avoid stressing your intestines further. Avoid dairy products (especially milk & ice cream) for a short time.  The intestines often can lose the ability to digest lactose when stressed. Avoid foods that cause gassiness or bloating.  Typical foods include beans and other legumes, cabbage, broccoli, and dairy foods.  Avoid greasy, spicy, fast foods.  Every person has some sensitivity to other foods, so listen to your body and avoid those foods that trigger problems for you. Probiotics (such as active yogurt, Align, etc) may help repopulate the intestines and colon with normal bacteria and calm down a sensitive digestive tract Adding a fiber supplement gradually can help thicken stools by absorbing excess fluid and retrain the intestines to act more normally.  Slowly increase the dose over a few weeks.  Too much fiber too soon can backfire and cause cramping & bloating. It is okay to try and slow down diarrhea with a few doses of antidiarrheal medicines.   Bismuth subsalicylate (ex. Kayopectate, Pepto Bismol) for a few doses can help control diarrhea.  Avoid if pregnant.   Loperamide (Imodium) can slow down diarrhea.  Start with one tablet (2mg ) first.  Avoid if you are having fevers or severe pain.  ILEOSTOMY PATIENTS WILL HAVE CHRONIC DIARRHEA since their colon is not in use.    Drink plenty of liquids.  You will need to drink even more glasses of water/liquid a day to avoid getting dehydrated. Record output from your ileostomy.  Expect to empty the bag every 3-4 hours at  first.  Most people with a permanent ileostomy empty their bag 4-6 times at the least.   Use antidiarrheal medicine (especially Imodium) several times a day to avoid getting dehydrated.  Start with a dose at bedtime & breakfast.  Adjust  up or down as needed.  Increase antidiarrheal medications as directed to avoid emptying the bag more than 8 times a day (every 3 hours). Work with your wound ostomy nurse to learn care for your ostomy.  See ostomy care instructions. TROUBLESHOOTING IRREGULAR BOWELS 1) Start with a soft & bland diet. No spicy, greasy, or fried foods.  2) Avoid gluten/wheat or dairy products from diet to see if symptoms improve. 3) Miralax 17gm or flax seed mixed in 8oz. water or juice-daily. May use 2-4 times a day as needed. 4) Gas-X, Phazyme, etc. as needed for gas & bloating.  5) Prilosec (omeprazole) over-the-counter as needed 6)  Consider probiotics (Align, Activa, etc) to help calm the bowels down  Call your doctor if you are getting worse or not getting better.  Sometimes further testing (cultures, endoscopy, X-ray studies, CT scans, bloodwork, etc.) may be needed to help diagnose and treat the cause of the diarrhea. [facility] [address], [address], [place], [place]  [address] [phone] - Main.    [phone]  - Toll Free.   [phone] - Fax [email or web address]

## 2023-09-29 NOTE — Anesthesia Postprocedure Evaluation (Signed)
Anesthesia Post Note  Patient: [name] [name]  Procedure(s) Performed: OPEN TAKEDOWN OF LOOP ILEOSTOMY, LEFT TAP BLOCK RECTAL EXAM UNDER ANESTHESIA     Patient location during evaluation: PACU Anesthesia Type: General Level of consciousness: awake and alert Pain management: pain level controlled Vital Signs Assessment: post-procedure vital signs reviewed and stable Respiratory status: spontaneous breathing, nonlabored ventilation, respiratory function stable and patient connected to nasal cannula oxygen Cardiovascular status: blood pressure returned to baseline and stable Postop Assessment: no apparent nausea or vomiting Anesthetic complications: no   No notable events documented.  Last Vitals:  Vitals:   09/29/23 1015 09/29/23 1030  BP: (!) 140/68 138/77  Pulse: 67 61  Resp: 18 14  Temp: 36.5 C   SpO2: 100% 99%    Last Pain:  Vitals:   09/29/23 1030  TempSrc:   PainSc: 0-No pain                 Delyla Sandeen

## 2023-09-29 NOTE — Op Note (Addendum)
09/29/2023  10:05 AM  PATIENT:  [name] [name]  77 y.o. male  Patient Care Team: Miki Kins, FNP as PCP - General (Family Medicine) Mariah Milling, Tollie Pizza, MD as PCP - Cardiology (Cardiology) Mariah Milling Tollie Pizza, MD as Consulting Physician (Cardiology) Benita Gutter, RN as Oncology Nurse Navigator Orlie Dakin, Tollie Pizza, MD as Consulting Physician (Oncology) [name], MD as Consulting Physician (Colon and Rectal Surgery)  PRE-OPERATIVE DIAGNOSIS:  LOOP ILEOSTOMY IN PLACE FOR FECAL DIVERSION  POST-OPERATIVE DIAGNOSIS:  LOOP ILEOSTOMY IN PLACE FOR FECAL DIVERSION  PROCEDURE:   OPEN TAKEDOWN OF LOOP ILEOSTOMY TRANSVERSUS ABDOMINIS PLANE (TAP) BLOCK - LEFT ANORECTAL EXAM UNDER ANESTHESIA  SURGEON:  Ardeth Sportsman, MD  ASSISTANT:  (n/a)   ANESTHESIA:  General endotracheal intubation anesthesia (GETA) and Regional TRANSVERSUS ABDOMINIS PLANE (TAP) nerve block -LEFT for perioperative & postoperative pain control at the level of the transverse abdominis & preperitoneal spaces along the flank at the anterior axillary line, from subcostal ridge to iliac crest under laparoscopic guidance provided with liposomal bupivacaine (Experel) 20mL mixed with 50 mL of bupivicaine 0.25% with epinephrine  Estimated Blood Loss (EBL):   Total I/O In: 800 [I.V.:700; IV Piggyback:100] Out: 25 [Blood:25].   (See anesthesia record)  Delay start of Pharmacological VTE agent (>24hrs) due to concerns of significant anemia, surgical blood loss, or risk of bleeding?:  no  DRAINS: (None)  SPECIMEN:  Ileostomy  DISPOSITION OF SPECIMEN:  Pathology  COUNTS:  Sponge, needle, & instrument counts CORRECT  PLAN OF CARE: Admit to inpatient   PATIENT DISPOSITION:  PACU - hemodynamically stable.  INDICATION:  Patient with history of rectal cancer status post total neoadjuvant chemoradiation therapy and robotic low anterior rectosigmoid resection with protective loop ileostomy in July 2024.  Had good  downstaging with no need post adjuvant chemotherapy.  Enema at colorectal anastomosis showing no leak or obstruction.  The patient has recovered to the point not requiring fecal diversion.  Requested ostomy takedown.  I felt was appropriate this time.  i recommended takedown of ileostomy and examination under anesthesia:   The anatomy & physiology of the digestive tract was discussed. The pathophysiology was discussed. Possibility of remaining with an ostomy permanently was discussed. I offered ostomy takedown. Laparoscopic & open techniques were discussed.   Risks such as bleeding, infection, abscess, leak, reoperation, possible re-ostomy, injury to other organs, hernia, heart attack, death, and other risks were discussed. I noted a good likelihood this will help address the problem. Goals of post-operative recovery were discussed as well. We will work to minimize complications. Questions were answered. The patient expresses understanding & wishes to proceed with surgery.   OR FINDINGS:   No stricture at colorectal EEA circular stapled anastomosis-8 cm from anal verge.  Gently dilated.  Anastomosis well healed.   Moderate adhesions of loop ileostomy to abdominal wall. No parastomal hernia.   It is a side-to-side antiperistaltic stapled ileoileal anastomosis close to the terminal ileum.   CASE DATA: Type of patient?: Elective [facility] Private Case Status of Case? Elective Scheduled Infection Present At Time Of Surgery (PATOS)?  NO  DESCRIPTION:   Informed consent was confirmed. The patient received IV antibiotics and underwent general anesthesia without any difficulty. The patient was positioned in low litholtomy.  SCDs were active during the entire case.  Surgical timeout confirmed. I performed examination under anesthesia of the perineum. I can palpate the anastomosis in the neorectum.  Findings as above.  No evidence of fistula leak or irritation. Sphincter intact.  The abdomen was prepped and  draped in a sterile fashion. A surgical timeout confirmed our plan.  I proceeded to make a circular incision around the loop ileostomy. The loop ileostomy was freed from adhesions to the subcutaneous and fascial layers of the abdominal wall using focused cautery and sharp dissection.  I was able to breech safely into the peritoneum. No adhesions felt. We could eviscerate the small bowel just proximal/distal to the loop ileostomy.   We did a side-to-side stapled anastomosis using a GIA-75 stapler.  We then used a TLX 90 to staple off the common defect.   I used interrupted 2-0 silk stitches to close the mesenteric defect transversely and do mesenteo-pexy over the TX staple line.  Another silk suture placed at the crotch of proximal end of the anastomosis for antitension.  Hemostasis was excellent. Ileostomy returned into the peritoneal cavity.  We changed gloves.  We assured hemostasis and the former ostomy wound.  Wound irrigated.  I closed the posterior rectus fascia with 0 Vicryl suture.  Anterior rectus fascia was closed using #1 PDS in a running fashion transversely. Skin edges trimmed to remove excess scarring and have healthy edges.    I excised redundant tissue to have a transverse biconcave wound.  I closed this with interrupted suture deep dermal Vicryl suture.  Placed order wrench Nu Gauze ribbon tape with each and in the corners of the closure to allow deeper drainage..  Sterile dressing placed.   Patient extubated and in recovery room in stable condition.  I discussed postoperative plans for recovery with the patient in the holding area.  Instructions are written.  I called his spouse Tyler Aas but she did not answer.  I was able to reach the patient's son on the phone though.  I discussed operative findings, updated the patient's status, discussed probable steps to recovery, and gave postoperative recommendations to the patient's son, Thereasa Distance.  Recommendations were made.  Questions were answered.  He  expressed understanding & appreciation.  Ardeth Sportsman, MD, FACS, MASCRS Esophageal, Gastrointestinal & Colorectal Surgery Robotic and Minimally Invasive Surgery  [facility], [facility]  [facility]  [address] [place], [place] [address] [phone] Fax [phone] Main

## 2023-09-29 NOTE — Interval H&P Note (Signed)
History and Physical Interval Note:  09/29/2023 8:09 AM  [name] [name]  has presented today for surgery, with the diagnosis of LOOP ILEOSTOMY IN PLACE FOR FECAL DIVERSION.  The various methods of treatment have been discussed with the patient and family. After consideration of risks, benefits and other options for treatment, the patient has consented to  Procedure(s): OPEN TAKEDOWN OF LOOP ILEOSTOMY (N/A) RECTAL EXAM UNDER ANESTHESIA (N/A) as a surgical intervention.  The patient's history has been reviewed, patient examined, no change in status, stable for surgery.  I have reviewed the patient's chart and labs.  Questions were answered to the patient's satisfaction.    I have re-reviewed the the patient's records, history, medications, and allergies.  I have re-examined the patient.  I again discussed intraoperative plans and goals of post-operative recovery.  The patient agrees to proceed.  H[place] [facility]  1946/09/25 [id number]  Patient Care Team: Miki Kins, FNP as PCP - General (Family Medicine) Antonieta Iba, MD as PCP - Cardiology (Cardiology) Antonieta Iba, MD as Consulting Physician (Cardiology) Benita Gutter, RN as Oncology Nurse Navigator Orlie Dakin, Tollie Pizza, MD as Consulting Physician (Oncology) [name], MD as Consulting Physician (Colon and Rectal Surgery)  Patient Active Problem List   Diagnosis Date Noted   Rectal adenocarcinoma (HCC) 08/07/2022    Priority: High   Irritant contact dermatitis due to ileostomy (HCC) 06/11/2023   Protein-calorie malnutrition, severe 06/10/2023   Anemia of chronic disease 06/09/2023   Dehydration 06/08/2023   Nausea and vomiting 06/07/2023   High output ileostomy (HCC) 06/04/2023   CKD stage 3b, GFR 30-44 ml/min (HCC) 05/21/2023   Ileostomy present (HCC) 05/06/2023   Irritant contact dermatitis associated with fecal stoma    Type 2 diabetes mellitus with diabetic polyneuropathy, without long-term  current use of insulin (HCC) 02/06/2020   Back pain 12/04/2014   Hyperlipidemia associated with type 2 diabetes mellitus (HCC) 04/04/2010   Tobacco abuse 04/04/2010   Hypertension associated with diabetes (HCC) 04/04/2010   Coronary artery disease 04/04/2010    Past Medical History:  Diagnosis Date   Anemia    Anginal pain (HCC)    Bradycardia    Cancer (HCC)    Coronary artery disease    x 1 stent   Depression    Diabetes mellitus without complication (HCC)    GERD (gastroesophageal reflux disease)    History of kidney stones    Hyperlipidemia    Hypertension    Nephrolithiasis    Partial Large bowel obstruction (HCC)     Past Surgical History:  Procedure Laterality Date   CARDIAC CATHETERIZATION     CATARACT EXTRACTION     COLONOSCOPY WITH PROPOFOL N/A 05/05/2023   Procedure: COLONOSCOPY WITH PROPOFOL;  Surgeon: Regis Bill, MD;  Location: [facility] ENDOSCOPY;  Service: Endoscopy;  Laterality: N/A;  Patient having surgery tomorrow, 05/06/2023. Ok'd per Collins and office   CORONARY ANGIOPLASTY     EYE SURGERY     FLEXIBLE SIGMOIDOSCOPY N/A 07/27/2022   Procedure: FLEXIBLE SIGMOIDOSCOPY;  Surgeon: Regis Bill, MD;  Location: [facility] ENDOSCOPY;  Service: Endoscopy;  Laterality: N/A;   KIDNEY STONE SURGERY     NECK SURGERY  12/18/2011   nephrolithiasis     PORTACATH PLACEMENT Right 08/24/2022   Procedure: INSERTION PORT-A-CATH;  Surgeon: Campbell Lerner, MD;  Location: [facility] ORS;  Service: General;  Laterality: Right;   stent (other)     TRANSVERSE LOOP COLOSTOMY N/A 07/28/2022   Procedure: TRANSVERSE LOOP COLOSTOMY;  Surgeon: Campbell Lerner, MD;  Location: [facility] ORS;  Service: General;  Laterality: N/A;   XI ROBOTIC ASSISTED LOWER ANTERIOR RESECTION N/A 05/06/2023   Procedure: XI ROBOTIC ASSISTED LOWER ANTERIOR RECTOSIGMOID RESECTION, TAKEDOWN OF OLD LOOP COLOSTOMY WITH ANASTOMOSIS, MOBILIZATION OF SPLENIC FLEXURE OF COLON, AND NEW DIVERTING LOOP ILEOSTOMY,  INTRAOPERATIVE ASSESSMENT OF TISSUE VASCULAR PERFUSION USING ICG (INDOCYANINE GREEN) IMMUNOFLUORESCENCE, FLEXIBLE SIGMOIDOSCOPY;  Surgeon: [name], MD;  Location: [facility] ORS;  Service: General;  Lateralit    Social History   Socioeconomic History   Marital status: Married    Spouse name: Doris   Number of children: Not on file   Years of education: Not on file   Highest education level: Not on file  Occupational History   Not on file  Tobacco Use   Smoking status: Every Day    Current packs/day: 0.25    Average packs/day: 0.3 packs/day for 36.0 years (9.0 ttl pk-yrs)    Types: Cigarettes   Smokeless tobacco: Never   Tobacco comments:    Smokes 6 cigarettes daily   Vaping Use   Vaping status: Never Used  Substance and Sexual Activity   Alcohol use: No   Drug use: No   Sexual activity: Not on file  Other Topics Concern   Not on file  Social History Narrative   Not on file   Social Determinants of Health   Financial Resource Strain: Not on file  Food Insecurity: No Food Insecurity (06/08/2023)   Hunger Vital Sign    Worried About Running Out of Food in the Last Year: Never true    Ran Out of Food in the Last Year: Never true  Transportation Needs: No Transportation Needs (06/08/2023)   PRAPARE - Administrator, Civil Service (Medical): No    Lack of Transportation (Non-Medical): No  Physical Activity: Not on file  Stress: Not on file  Social Connections: Not on file  Intimate Partner Violence: Not At Risk (06/08/2023)   Humiliation, Afraid, Rape, and Kick questionnaire    Fear of Current or Ex-Partner: No    Emotionally Abused: No    Physically Abused: No    Sexually Abused: No    Family History  Problem Relation Age of Onset   Heart disease Father     Medications Prior to Admission  Medication Sig Dispense Refill Last Dose   aspirin 81 MG EC tablet Take 81 mg by mouth daily.     09/28/2023   atorvastatin (LIPITOR) 40 MG tablet Take 1 tablet (40 mg  total) by mouth daily. 90 tablet 3 09/29/2023 at 0430   bisacodyl (DULCOLAX) 5 MG EC tablet Take 5 mg by mouth daily as needed for moderate constipation.   Past Week   Cholecalciferol (VITAMIN D-3 PO) Take 1 tablet by mouth daily.   Past Week   Cyanocobalamin (VITAMIN B-12 PO) Take 1 tablet by mouth daily.   Past Week   famotidine (PEPCID) 40 MG tablet Take 1 tablet (40 mg total) by mouth daily. 90 tablet 3 09/29/2023 at 0430   ferrous sulfate 325 (65 FE) MG tablet Take 1 tablet (325 mg total) by mouth 2 (two) times daily with a meal. 60 tablet 3 Past Week   glimepiride (AMARYL) 4 MG tablet Take 4 mg by mouth daily with breakfast.   09/28/2023   isosorbide mononitrate (IMDUR) 30 MG 24 hr tablet Take 30 mg by mouth daily.   09/29/2023 at 0430   loperamide (IMODIUM) 2 MG capsule Take 1 capsule (  2 mg total) by mouth 2 (two) times daily. 30 capsule 0    metFORMIN (GLUCOPHAGE) 1000 MG tablet Take 1,000 mg by mouth 2 (two) times daily.   09/28/2023   nitroGLYCERIN (NITROSTAT) 0.4 MG SL tablet Place 1 tablet (0.4 mg total) under the tongue every 5 (five) minutes as needed for chest pain. 25 tablet 6    ondansetron (ZOFRAN) 8 MG tablet Take 8 mg by mouth every 8 (eight) hours as needed for nausea or vomiting.   Past Month   pioglitazone (ACTOS) 15 MG tablet Take 15 mg by mouth daily.   09/29/2023 at 0430   diphenoxylate-atropine (LOMOTIL) 2.5-0.025 MG tablet Take 1 tablet by mouth 2 (two) times daily. (Patient not taking: Reported on 09/17/2023) 30 tablet 5 Not Taking    Current Facility-Administered Medications  Medication Dose Route Frequency Provider Last Rate Last Admin   bupivacaine liposome (EXPAREL) 1.3 % injection 266 mg  20 mL Infiltration Once [name], MD       cefoTEtan (CEFOTAN) 2 g in sodium chloride 0.9 % 100 mL IVPB  2 g Intravenous On Call to OR [name], MD       Chlorhexidine Gluconate Cloth 2 % PADS 6 each  6 each Topical Once [name], MD       insulin aspart (novoLOG)  injection 0-7 Units  0-7 Units Subcutaneous Q2H PRN Bethena Midget, MD       lactated ringers infusion   Intravenous Continuous Eilene Ghazi, MD 10 mL/hr at 09/29/23 0715 Continued from Pre-op at 09/29/23 0715     No Known Allergies  BP 124/74   Pulse (!) 58   Temp 98.1 F (36.7 C) (Oral)   Resp 16   Ht 6' (1.829 m)   Wt 78.9 kg   SpO2 99%   BMI 23.60 kg/m   Labs: Results for orders placed or performed during the hospital encounter of 09/29/23 (from the past 48 hour(s))  Glucose, capillary     Status: Abnormal   Collection Time: 09/29/23  7:02 AM  Result Value Ref Range   Glucose-Capillary 116 (H) 70 - 99 mg/dL    Comment: Glucose reference range applies only to samples taken after fasting for at least 8 hours.   Comment 1 Notify RN    Comment 2 Document in Chart     Imaging / Studies: No results found.   Ardeth Sportsman, M.D., F.A.C.S. Gastrointestinal and Minimally Invasive Surgery [facility] [address], [address] [place], [place] [address] [phone] Main / Paging  09/29/2023 8:10 AM     Ardeth Sportsman

## 2023-09-29 NOTE — H&P (Signed)
09/29/2023    PROVIDER: Jarrett Soho, MD  Patient Care Team: Practice, [facility] as PCP - General (Family Medicine) Mia Creek, Janit Bern, MD as Consulting Provider (Gastroenterology) Michaell Cowing, Shawn Route, MD as Consulting Provider (Colon and Rectal Surgery) Chrystal, Gordy Councilman, MD (Radiation Oncology) Hulen Shouts, MD (Cardiovascular Disease) Eber Hong, MD (Hematology and Oncology)  [facility] MRN: [id number] DOB: November 14, 1946 DATE OF ENCOUNTER: 07/12/2023  Interval History:   The patient returns to the office after undergoing robotic low anterior rectosigmoid resection with takedown of loop colostomy and diverting loop ileostomy 05/06/2023   Pathology: Pathologic Stage Classification (pTNM, AJCC 8th Edition): ypT3, ypN0  Patient returns. He did get admitted for being dehydrated and stabilized. Went home with IV fluids. Creatinine got as high as 2 but then stabilized and is been 1-1.2 for the past month. I do not think he is gotten his contrast enema yet. He comes with his wife. He feels like he is eating pretty well. Emptying the ostomy bag every 3-4 hours. He is not certain he is taking anything to slow down his bowels. Neither is his wife. I had written for some Imodium and fiber for him. He has felt a little pressure down his pelvis but nothing too severe. No fevers or chills. Not lightheaded or dizzy. His wife is still getting IV fluids at home.  PRIOR VISIT 06/07/2203 Patient returns for first postop visit. He went home postop day #5. He had some nausea vomiting and called the oncology department. We never really heard about it. They went to an outside hospital was admitted for a few days for dehydration. They never followed up with an ostomy clinic but called our office about this. We push to set that up 3 weeks later. Went to ostomy clinic last week. Some irritation skin. He is felt rather nauseated and has been hesitant to eat much in the way of solid  food. He has been doing boost and Ensure. Drinking liquids. He thinks he has been taking nausea medicines but he do not does not know what it is. He is here with his wife and she does not know what it is. I cannot find it on the formulary. He says his glucose is running around 150. He is having about 4-5 bowel movements through the ileostomy which actually is not that bad. I do not think he is taking any fiber and Imodium at this time.    Labs, Imaging and Diagnostic Testing:  Located in 'Care Everywhere' section of Epic EMR chart  PRIOR [facility] CLINIC NOTES:  Located in 'Care Everywhere' section of Epic EMR chart  SURGERY NOTES:  Located in 'Care Everywhere' section of Epic EMR chart  POST-OPERATIVE DIAGNOSIS:  RECTAL CANCER COLON OBSTRUCTION DUE TO RECTAL CANCER S/P DIVERTING LOOP COLOSTOMY  PROCEDURE:  LOW ANTERIOR RECTOSIGMOID RESECTION TAKEDOWN OF LOOP COLOSTOMY WITH ANASTOMOSIS MOBILIZATION OF SPLENIC FLEXURE OF COLON DIVERTING LOOP ILEOSTOMY INTRAOPERATIVE ASSESSMENT OF TISSUE VASCULAR PERFUSION USING ICG (indocyanine green) IMMUNOFLUORESCENCE FLEXIBLE SIGMOIDOSCOPY  SURGEON: Ardeth Sportsman, MD  OR FINDINGS:   Patient had thickened stellate scarring at peritoneal fraction at the junction tween the proximal and mid rectum. Seem to have a decent treatment effect. No obvious carcinomatosis. No obvious metastatic disease on visceral parietal peritoneum or liver. Need for loop colostomy takedown and splenic flexure mobilization to get reach.  It is a 29mm EEA anastomosis ( distal descending colon connected to mid/distal rectal junction .) It rests 7 cm from the anal verge by rigid proctoscopy.  Protective diverting loop ileostomy created. Positioned at old left upper quadrant colostomy site with some fascial and skin closure/revision. Proximal end is cephalad.   PATHOLOGY:  Located in 'Care Everywhere' section of Epic EMR chart  PATHOLOGY  FINAL MICROSCOPIC  DIAGNOSIS:  A. COLOSTOMY: Colostomy. Negative for malignancy.  B. RECTOSIGMOID, RESECTION: Invasive adenocarcinoma, moderately differentiated associated with extensive fibrosis. Carcinoma extends into perirectal connective tissue. All surgical margins negative for carcinoma. Ten lymph nodes negative for metastatic carcinoma (0/10). Pathologic Stage Classification (pTNM, AJCC 8th Edition): ypT3, ypN0  C. FINAL DISTAL MARGIN: Benign colon. Distal margin negative for carcinoma.  Physical Examination:   There is no height or weight on file to calculate BMI.  Constitutional: Even more thin. Borderline cachectic. Tired but not toxic. Hygeine adequate.  Eyes: Normal extraocular movements. Sclera nonicteric Neuro: No major focal sensory defects. No major motor deficits. Psych: No severe agitation. No severe anxiety. Judgment & insight Adequate, Oriented x4, HENT: Normocephalic, Mucus membranes moist. No thrush.  Neck: Supple, No tracheal deviation.  Chest: Good respiratory excursion. No audible wheezing CV: No major extremity edema Ext: No obvious deformity or contracture. Edema: not present. No cyanosis Skin: Warm and dry Musculoskeletal: Mobility: no assist device moves with minimal assistance  Abdomen: Incisions Clean & dry with normal healing ridge Nontender. Soft. Nondistended.  Ileostomy left upper quadrant and old ostomy site. Pink. Some thin and oatmeal consistency effluent in bag. There is some skin rash around it but no active leaking today  Gen: Inguinal hernia: Not present. Inguinal lymph nodes: without lymphadenopathy.   Rectal: Perineal skin clear. Intact sphincter tone. Sensitive but tolerates digital exam. I can feel some waxy mucus balls near the tip my finger and I believe I feel the ring of his anastomosis 7-8 cm from the anal verge. Mild narrowing. No rectal wall thickness. No inflammation or swelling.    Assessment and Plan:   [name] [name] is a 77  y.o. male gurgling status post robotic low anterior resection with takedown of diverting distal transverse colostomy and creation of diverting loop ileostomy to old ostomy site 05/06/2023.  Pathology downstage to after total neoadjuvant chemoradiation therapy to ypT3, ypN0  No leak on enema  Plan loop ileostomy TD:   The anatomy & physiology of the digestive tract was discussed. The pathophysiology was discussed. Possibility of remaining with an ostomy permanently was discussed. I offered ostomy takedown. Laparoscopic & open techniques were discussed.   Risks such as bleeding, infection, abscess, leak, reoperation, possible re-ostomy, injury to other organs, need for repair of tissues / organs, need for further treatment, hernia, heart attack, death, and other risks were discussed. I noted a good likelihood this will help address the problem. Goals of post-operative recovery were discussed as well. We will work to minimize complications. Questions were answered. The patient expresses understanding & wishes to proceed with surgery.  He is no longer on his Plavix.   Ardeth Sportsman, MD, FACS, MASCRS Esophageal, Gastrointestinal & Colorectal Surgery Robotic and Minimally Invasive Surgery  [facility] [address] [place], [place] [address] [phone] Fax [phone] Main  CONTACT INFORMATION: Weekday (9AM-5PM): Call [facility] main office at [phone] Weeknight (5PM-9AM) or Weekend/Holiday: Check EPIC "Web Links" tab & use "AMION" (password " [email or web address]") for General Surgery [facility] coverage  Please, DO NOT use SecureChat  (it is not reliable communication to reach operating surgeons & will lead to a delay in care).   Epic staff  messaging available for outptient concerns needing 1-2 business day response.     09/29/2023

## 2023-09-29 NOTE — Anesthesia Procedure Notes (Cosign Needed)
Procedure Name: Intubation Date/Time: 09/29/2023 8:48 AM  Performed by: Sampson Goon, CRNAPre-anesthesia Checklist: Patient identified, Emergency Drugs available, Suction available and Patient being monitored Patient Re-evaluated:Patient Re-evaluated prior to induction Oxygen Delivery Method: Circle System Utilized Preoxygenation: Pre-oxygenation with 100% oxygen Induction Type: IV induction Ventilation: Mask ventilation without difficulty and Oral airway inserted - appropriate to patient size Laryngoscope Size: Mac and 4 Grade View: Grade I Tube type: Oral Tube size: 7.5 mm Number of attempts: 1 Airway Equipment and Method: Stylet and Oral airway Placement Confirmation: ETT inserted through vocal cords under direct vision, positive ETCO2 and breath sounds checked- equal and bilateral Secured at: 24 cm Tube secured with: Tape Dental Injury: Teeth and Oropharynx as per pre-operative assessment

## 2023-09-30 ENCOUNTER — Encounter (HOSPITAL_COMMUNITY): Payer: Self-pay | Admitting: Surgery

## 2023-09-30 DIAGNOSIS — F32A Depression, unspecified: Secondary | ICD-10-CM | POA: Insufficient documentation

## 2023-09-30 DIAGNOSIS — K219 Gastro-esophageal reflux disease without esophagitis: Secondary | ICD-10-CM | POA: Diagnosis present

## 2023-09-30 LAB — BASIC METABOLIC PANEL
Anion gap: 10 (ref 5–15)
BUN: 27 mg/dL — ABNORMAL HIGH (ref 8–23)
CO2: 24 mmol/L (ref 22–32)
Calcium: 8.9 mg/dL (ref 8.9–10.3)
Chloride: 100 mmol/L (ref 98–111)
Creatinine, Ser: 1.35 mg/dL — ABNORMAL HIGH (ref 0.61–1.24)
GFR, Estimated: 55 mL/min — ABNORMAL LOW (ref >60)
Glucose, Bld: 181 mg/dL — ABNORMAL HIGH (ref 70–99)
Potassium: 5.1 mmol/L (ref 3.5–5.1)
Sodium: 134 mmol/L — ABNORMAL LOW (ref 135–145)

## 2023-09-30 LAB — CBC
HCT: 30.3 % — ABNORMAL LOW (ref 39.0–52.0)
Hemoglobin: 9.5 g/dL — ABNORMAL LOW (ref 13.0–17.0)
MCH: 30.7 pg (ref 26.0–34.0)
MCHC: 31.4 g/dL (ref 30.0–36.0)
MCV: 98.1 fL (ref 80.0–100.0)
Platelets: 149 10*3/uL — ABNORMAL LOW (ref 150–400)
RBC: 3.09 MIL/uL — ABNORMAL LOW (ref 4.22–5.81)
RDW: 14.3 % (ref 11.5–15.5)
WBC: 9.9 10*3/uL (ref 4.0–10.5)
nRBC: 0 % (ref 0.0–0.2)

## 2023-09-30 LAB — MAGNESIUM: Magnesium: 2 mg/dL (ref 1.7–2.4)

## 2023-09-30 LAB — SURGICAL PATHOLOGY

## 2023-09-30 MED ORDER — FERROUS SULFATE 325 (65 FE) MG PO TABS
325.0000 mg | ORAL_TABLET | Freq: Two times a day (BID) | ORAL | Status: DC
  Administered 2023-09-30 – 2023-10-01 (×3): 325 mg via ORAL
  Filled 2023-09-30 (×3): qty 1

## 2023-09-30 MED ORDER — METFORMIN HCL 500 MG PO TABS: 1000.0000 mg | ORAL_TABLET | Freq: Two times a day (BID) | ORAL | Status: DC

## 2023-09-30 MED ORDER — LACTATED RINGERS IV SOLN: Freq: Three times a day (TID) | INTRAVENOUS | Status: AC | PRN

## 2023-09-30 MED ORDER — METFORMIN HCL 500 MG PO TABS
500.0000 mg | ORAL_TABLET | Freq: Two times a day (BID) | ORAL | Status: DC
  Administered 2023-09-30 – 2023-10-01 (×3): 500 mg via ORAL
  Filled 2023-09-30 (×3): qty 1

## 2023-09-30 MED ORDER — ALUM & MAG HYDROXIDE-SIMETH 200-200-20 MG/5ML PO SUSP: 30.0000 mL | Freq: Four times a day (QID) | ORAL | Status: DC | PRN

## 2023-09-30 NOTE — Progress Notes (Signed)
09/30/2023  [name] [name] [id number] 1946-03-12  CARE TEAM: PCP: Miki Kins, FNP  Outpatient Care Team: Patient Care Team: Miki Kins, FNP as PCP - General (Family Medicine) Mariah Milling, Tollie Pizza, MD as PCP - Cardiology (Cardiology) Mariah Milling Tollie Pizza, MD as Consulting Physician (Cardiology) Benita Gutter, RN as Oncology Nurse Navigator Orlie Dakin, Tollie Pizza, MD as Consulting Physician (Oncology) [name], MD as Consulting Physician (Colon and Rectal Surgery)  Inpatient Treatment Team: Treatment Team:  [name], MD Idelia Salm, RN Kelby Aline, RN Rutherford, Garvin Fila, RN   Problem List:   Principal Problem:   Rectal adenocarcinoma [facility]) Active Problems:   Hyperlipidemia associated with type 2 diabetes mellitus (HCC)   Tobacco abuse   Hypertension associated with diabetes (HCC)   Coronary artery disease   Type 2 diabetes mellitus with diabetic polyneuropathy, without long-term current use of insulin (HCC)   CKD stage 3b, GFR 30-44 ml/min (HCC)   Anemia of chronic disease   GERD (gastroesophageal reflux disease)   09/29/2023  POST-OPERATIVE DIAGNOSIS:  LOOP ILEOSTOMY IN PLACE FOR FECAL DIVERSION   PROCEDURE:   OPEN TAKEDOWN OF LOOP ILEOSTOMY TRANSVERSUS ABDOMINIS PLANE (TAP) BLOCK - LEFT ANORECTAL EXAM UNDER ANESTHESIA   SURGEON:  Ardeth Sportsman, MD   OR FINDINGS: No stricture at colorectal EEA circular stapled anastomosis-8 cm from anal verge.  Gently dilated.  Anastomosis well healed.  Moderate adhesions of loop ileostomy to abdominal wall. No parastomal hernia.  It is a side-to-side antiperistaltic stapled ileoileal anastomosis close to the terminal ileum.   Assessment [facility] Stay = 1 days) 1 Day Post-Op    Recovering well so far    Plan:  -ERAS protocol  -Tolerating liquids.  Advance diet  -Fiber bowel regimen.  Hold off on antidiarrheals for now.  Pepto-Bismol PRN (as needed) is a more gentle  control  -Mild hyperglycemia in the setting of diabetes on multiple oral hypoglycemics.  I just done 1 agent I will add the second metoclopramide.  Chronic kidney disease stable and nonoliguric.  Hypertension controlled.  GERD controlled.  Anemia chronic disease.  Not too severe.  Add some oral iron  -VTE prophylaxis- SCDs, etc   -mobilize as tolerated to help recovery -Disposition:  Disposition:  The patient is from: Home Anticipate discharge to:  Home Anticipated Date of Discharge is:  November 15,2024   Barriers to discharge:  Pending Clinical improvement (more likely than not)  Patient currently is NOT MEDICALLY STABLE for discharge from the hospital from a surgery standpoint.      I reviewed last 24 h vitals and pain scores, last 48 h intake and output, last 24 h labs and trends, and last 24 h imaging results.  I have reviewed this patient's available data, including medical history, events of note, test results, etc as part of my evaluation.   A significant portion of that time was spent in counseling. Care during the described time interval was provided by me.  This care required moderate level of medical decision making.  09/30/2023    Subjective: (Chief complaint)  Tolerated liquids.  Having flatus and some bowel movements.  Some mild false alarms but no incontinence.  Walking around in room without difficulty.  Denies any pain.  No nausea.  Objective:  Vital signs:  Vitals:   09/29/23 1748 09/29/23 2034 09/30/23 0117 09/30/23 0621  BP: (!) 152/72 135/74 (!) 157/68 (!) 144/76  Pulse: (!) 55 61 60 60  Resp: 18 18 18  18  Temp: 98.6 F (37 C) 97.8 F (36.6 C) 97.8 F (36.6 C) 98 F (36.7 C)  TempSrc: Oral Oral Oral Oral  SpO2: 98% 96% 98% 100%  Weight:      Height:        Last BM Date : 09/29/23  Intake/Output   Yesterday:  11/13 0701 - 11/14 0700 In: 2684.9 [P.O.:1010; I.V.:1481.7; IV Piggyback:193.1] Out: 625 [Urine:600;  Blood:25] This shift:  No intake/output data recorded.  Bowel function:  Flatus: YES  BM:  YES  Drain: (No drain)   Physical Exam:  General: Pt awake/alert in no acute distress Eyes: PERRL, normal EOM.  Sclera clear.  No icterus Neuro: CN II-XII intact w/o focal sensory/motor deficits. Lymph: No head/neck/groin lymphadenopathy Psych:  No delerium/psychosis/paranoia.  Oriented x 4 HENT: Normocephalic, Mucus membranes moist.  No thrush Neck: Supple, No tracheal deviation.  No obvious thyromegaly Chest: No pain to chest wall compression.  Good respiratory excursion.  No audible wheezing CV:  Pulses intact.  Regular rhythm.  No major extremity edema MS: Normal AROM mjr joints.  No obvious deformity  Abdomen: Soft.  Mildy distended.  Mildly tender at incisions only.  No evidence of peritonitis.  No incarcerated hernias.  Ext:   No deformity.  No mjr edema.  No cyanosis Skin: No petechiae / purpurea.  No major sores.  Warm and dry    Results:   Cultures: No results found for this or any previous visit (from the past 720 hour(s)).  Labs: Results for orders placed or performed during the hospital encounter of 09/29/23 (from the past 48 hour(s))  Glucose, capillary     Status: Abnormal   Collection Time: 09/29/23  7:02 AM  Result Value Ref Range   Glucose-Capillary 116 (H) 70 - 99 mg/dL    Comment: Glucose reference range applies only to samples taken after fasting for at least 8 hours.   Comment 1 Notify RN    Comment 2 Document in Chart   Glucose, capillary     Status: Abnormal   Collection Time: 09/29/23  9:32 AM  Result Value Ref Range   Glucose-Capillary 131 (H) 70 - 99 mg/dL    Comment: Glucose reference range applies only to samples taken after fasting for at least 8 hours.  Glucose, capillary     Status: Abnormal   Collection Time: 09/29/23 10:14 AM  Result Value Ref Range   Glucose-Capillary 144 (H) 70 - 99 mg/dL    Comment: Glucose reference range applies only to  samples taken after fasting for at least 8 hours.  Basic metabolic panel     Status: Abnormal   Collection Time: 09/30/23  4:39 AM  Result Value Ref Range   Sodium 134 (L) 135 - 145 mmol/L   Potassium 5.1 3.5 - 5.1 mmol/L   Chloride 100 98 - 111 mmol/L   CO2 24 22 - 32 mmol/L   Glucose, Bld 181 (H) 70 - 99 mg/dL    Comment: Glucose reference range applies only to samples taken after fasting for at least 8 hours.   BUN 27 (H) 8 - 23 mg/dL   Creatinine, Ser 1.61 (H) 0.61 - 1.24 mg/dL   Calcium 8.9 8.9 - 09.6 mg/dL   GFR, Estimated 55 (L) >60 mL/min    Comment: (NOTE) Calculated using the CKD-EPI Creatinine Equation (2021)    Anion gap 10 5 - 15    Comment: Performed at [facility], [address] [place], [place] [address]  CBC  Status: Abnormal   Collection Time: 09/30/23  4:39 AM  Result Value Ref Range   WBC 9.9 4.0 - 10.5 K/uL   RBC 3.09 (L) 4.22 - 5.81 MIL/uL   Hemoglobin 9.5 (L) 13.0 - 17.0 g/dL   HCT 04.5 (L) 40.9 - 81.1 %   MCV 98.1 80.0 - 100.0 fL   MCH 30.7 26.0 - 34.0 pg   MCHC 31.4 30.0 - 36.0 g/dL   RDW 91.4 78.2 - 95.6 %   Platelets 149 (L) 150 - 400 K/uL   nRBC 0.0 0.0 - 0.2 %    Comment: Performed at [facility], [address] [place], [place] [address]  Magnesium     Status: None   Collection Time: 09/30/23  4:39 AM  Result Value Ref Range   Magnesium 2.0 1.7 - 2.4 mg/dL    Comment: Performed at [facility], [address] [place], [place] [address]    Imaging / Studies: No results found.  Medications / Allergies: per chart  Antibiotics: Anti-infectives (From admission, onward)    Start     Dose/Rate Route Frequency Ordered Stop   09/29/23 2100  cefoTEtan (CEFOTAN) 2 g in sodium chloride 0.9 % 100 mL IVPB        2 g 200 mL/hr over 30 Minutes Intravenous Every 12 hours 09/29/23 1239 09/29/23 2043   09/29/23 0645  cefoTEtan (CEFOTAN) 2 g in sodium chloride 0.9 % 100 mL IVPB        2  g 200 mL/hr over 30 Minutes Intravenous On call to O.R. 09/29/23 0636 09/29/23 0853         Note: Portions of this report may have been transcribed using voice recognition software. Every effort was made to ensure accuracy; however, inadvertent computerized transcription errors may be present.   Any transcriptional errors that result from this process are unintentional.    Ardeth Sportsman, MD, FACS, MASCRS Esophageal, Gastrointestinal & Colorectal Surgery Robotic and Minimally Invasive Surgery  [facility] [address] [place], [place] [address] [phone] Fax [phone] Main  CONTACT INFORMATION: Weekday (9AM-5PM): Call [facility] main office at [phone] Weeknight (5PM-9AM) or Weekend/Holiday: Check EPIC "Web Links" tab & use "AMION" (password " [email or web address]") for General Surgery [facility] coverage  Please, DO NOT use SecureChat  (it is not reliable communication to reach operating surgeons & will lead to a delay in care).   Epic staff messaging available for outptient concerns needing 1-2 business day response.      09/30/2023  7:21 AM

## 2023-09-30 NOTE — Plan of Care (Signed)
  Problem: Activity: Goal: Ability to tolerate increased activity will improve Outcome: Progressing   Problem: Activity: Goal: Ability to tolerate increased activity will improve Outcome: Progressing   Problem: Bowel/Gastric: Goal: Gastrointestinal status for postoperative course will improve Outcome: Progressing   Problem: Nutritional: Goal: Will attain and maintain optimal nutritional status will improve Outcome: Progressing   Problem: Clinical Measurements: Goal: Postoperative complications will be avoided or minimized Outcome: Progressing

## 2023-10-01 ENCOUNTER — Ambulatory Visit: Payer: Medicare HMO

## 2023-10-01 ENCOUNTER — Telehealth: Payer: Self-pay | Admitting: Oncology

## 2023-10-01 MED ORDER — TRAMADOL HCL 50 MG PO TABS: 50.0000 mg | ORAL_TABLET | Freq: Four times a day (QID) | ORAL | 0 refills | Status: AC | PRN

## 2023-10-01 MED ORDER — LACTATED RINGERS IV SOLN: Freq: Three times a day (TID) | INTRAVENOUS | Status: DC | PRN

## 2023-10-01 NOTE — Discharge Summary (Signed)
Physician Discharge Summary    [name] [name] MRN: [id number] DOB/AGE: [birth year] = 77 y.o.  Patient Care Team: Miki Kins, FNP as PCP - General (Family Medicine) Antonieta Iba, MD as PCP - Cardiology (Cardiology) Antonieta Iba, MD as Consulting Physician (Cardiology) Benita Gutter, RN as Oncology Nurse Navigator Orlie Dakin, Tollie Pizza, MD as Consulting Physician (Oncology) [name], MD as Consulting Physician (Colon and Rectal Surgery)  Admit date: 09/29/2023  Discharge date: 10/01/2023  Hospital Stay = 2 days    Discharge Diagnoses:  Principal Problem:   Rectal adenocarcinoma [facility]) Active Problems:   Hyperlipidemia associated with type 2 diabetes mellitus (HCC)   Tobacco abuse   Hypertension associated with diabetes (HCC)   Coronary artery disease   Type 2 diabetes mellitus with diabetic polyneuropathy, without long-term current use of insulin (HCC)   CKD stage 3b, GFR 30-44 ml/min (HCC)   Anemia of chronic disease   GERD (gastroesophageal reflux disease)   2 Days Post-Op  09/29/2023  POST-OPERATIVE DIAGNOSIS:   LOOP ILEOSTOMY IN PLACE FOR FECAL DIVERSION  SURGERY:  09/29/2023  Procedure(s): OPEN TAKEDOWN OF LOOP ILEOSTOMY, LEFT TAP BLOCK RECTAL EXAM UNDER ANESTHESIA  SURGEON:    Surgeon(s): [name], MD  Consults: Anesthesia  Hospital Course:   The patient underwent the surgery above.  Postoperatively, the patient gradually mobilized and advanced to a solid diet.  Pain and other symptoms were treated aggressively.    By the time of discharge, the patient was walking well the hallways, eating food, having flatus.  Pain was well-controlled on an oral medications.  Based on meeting discharge criteria and continuing to recover, I felt it was safe for the patient to be discharged from the hospital to further recover with close followup. Postoperative recommendations were discussed in detail.  They are written as  well.  Discharged Condition: good  Discharge Exam: Blood pressure (!) 159/70, pulse 71, temperature 98.7 F (37.1 C), temperature source Oral, resp. rate 14, height 6' (1.829 m), weight 79.3 kg, SpO2 98%.  General: Pt awake/alert/oriented x4 in No acute distress.  Eating up in bed smiling.  Not toxic.  Not sickly.  Chatty. Eyes: PERRL, normal EOM.  Sclera clear.  No icterus Neuro: CN II-XII intact w/o focal sensory/motor deficits. Lymph: No head/neck/groin lymphadenopathy Psych:  No delerium/psychosis/paranoia HENT: Normocephalic, Mucus membranes moist.  No thrush Neck: Supple, No tracheal deviation Chest:  No chest wall pain w good excursion CV:  Pulses intact.  Regular rhythm MS: Normal AROM mjr joints.  No obvious deformity Abdomen: Soft.  Nondistended.  Mildly tender at incisions only.  Old ileostomy site closed with fresh eschar.  No cellulitis or abscess.  No evidence of peritonitis.  No incarcerated hernias. Ext:  SCDs BLE.  No mjr edema.  No cyanosis Skin: No petechiae / purpura   Disposition:    Follow-up Information     [name], MD Follow up on 10/25/2023.   Specialties: General Surgery, Colon and Rectal Surgery Contact information: [address] [place] [address] [phone]                 Discharge disposition: 01-Home or Self Care       Discharge Instructions     Call MD for:   Complete by: As directed    FEVER > 101.5 F  (temperatures < 101.5 F are not significant)   Call MD for:  extreme fatigue   Complete by: As directed    Call MD  for:  persistant dizziness or light-headedness   Complete by: As directed    Call MD for:  persistant nausea and vomiting   Complete by: As directed    Call MD for:  redness, tenderness, or signs of infection (pain, swelling, redness, odor or green/yellow discharge around incision site)   Complete by: As directed    Call MD for:  severe uncontrolled pain   Complete by: As directed     Diet - low sodium heart healthy   Complete by: As directed    Start with a bland diet such as soups, liquids, starchy foods, low fat foods, etc. the first few days at home. Gradually advance to a solid, low-fat, high fiber diet by the end of the first week at home.   Add a fiber supplement to your diet (Metamucil, etc) If you feel full, bloated, or constipated, stay on a full liquid or pureed/blenderized diet for a few days until you feel better and are no longer constipated.   Discharge instructions   Complete by: As directed    See Discharge Instructions If you are not getting better after two weeks or are noticing you are getting worse, contact our office [phone] for further advice.  We may need to adjust your medications, re-evaluate you in the office, send you to the emergency room, or see what other things we can do to help. The clinic staff is available to answer your questions during regular business hours (8:30am-5pm).  Please don't hesitate to call and ask to speak to one of our nurses for clinical concerns.    A surgeon from [facility] is always on call at the hospitals 24 hours/day If you have a medical emergency, go to the nearest emergency room or call 911.   Discharge wound care:   Complete by: As directed    It is good for closed incisions and even open wounds to be washed every day.  Shower every day.  Short baths are fine.  Wash the incisions and wounds clean with soap & water.    You may leave closed incisions open to air if it is dry.   You may cover the incision with clean gauze & replace it after your daily shower for comfort.   Discharge wound care:   Complete by: As directed    You may shower over your incisions.  Expect some mild bleeding or drainage from your old ostomy site.  It should continue to dry up.  May cover with a Band-Aid as needed.  If redness is getting markedly worse or drainage not controlled, please call our office to see if you may  need to have it repacked.   Driving Restrictions   Complete by: As directed    You may drive when: - you are no longer taking narcotic prescription pain medication - you can comfortably wear a seatbelt - you can safely make sudden turns/stops without pain.   Increase activity slowly   Complete by: As directed    Start light daily activities --- self-care, walking, climbing stairs- beginning the day after surgery.  Gradually increase activities as tolerated.  Control your pain to be active.  Stop when you are tired.  Ideally, walk several times a day, eventually an hour a day.   Most people are back to most day-to-day activities in a few weeks.  It takes 4-6 weeks to get back to unrestricted, intense activity. If you can walk 30 minutes without difficulty, it is safe  to try more intense activity such as jogging, treadmill, bicycling, low-impact aerobics, swimming, etc. Save the most intensive and strenuous activity for last (Usually 4-8 weeks after surgery) such as sit-ups, heavy lifting, contact sports, etc.  Refrain from any intense heavy lifting or straining until you are off narcotics for pain control.  You will have off days, but things should improve week-by-week. DO NOT PUSH THROUGH PAIN.  Let pain be your guide: If it hurts to do something, don't do it.   Lifting restrictions   Complete by: As directed    If you can walk 30 minutes without difficulty, it is safe to try more intense activity such as jogging, treadmill, bicycling, low-impact aerobics, swimming, etc. Save the most intensive and strenuous activity for last (Usually 4-8 weeks after surgery) such as sit-ups, heavy lifting, contact sports, etc.   Refrain from any intense heavy lifting or straining until you are off narcotics for pain control.  You will have off days, but things should improve week-by-week. DO NOT PUSH THROUGH PAIN.  Let pain be your guide: If it hurts to do something, don't do it.  Pain is your body warning you to  avoid that activity for another week until the pain goes down.   May shower / Bathe   Complete by: As directed    May walk up steps   Complete by: As directed    Sexual Activity Restrictions   Complete by: As directed    You may have sexual intercourse when it is comfortable. If it hurts to do something, stop.       Allergies as of 10/01/2023   No Known Allergies      Medication List     STOP taking these medications    ferrous sulfate 325 (65 FE) MG tablet   loperamide 2 MG capsule Commonly known as: IMODIUM       TAKE these medications    aspirin EC 81 MG tablet Take 81 mg by mouth daily.   atorvastatin 40 MG tablet Commonly known as: LIPITOR Take 1 tablet (40 mg total) by mouth daily.   bisacodyl 5 MG EC tablet Commonly known as: DULCOLAX Take 5 mg by mouth daily as needed for moderate constipation.   diphenoxylate-atropine 2.5-0.025 MG tablet Commonly known as: LOMOTIL Take 1 tablet by mouth 2 (two) times daily.   famotidine 40 MG tablet Commonly known as: PEPCID Take 1 tablet (40 mg total) by mouth daily.   glimepiride 4 MG tablet Commonly known as: AMARYL Take 4 mg by mouth daily with breakfast.   isosorbide mononitrate 30 MG 24 hr tablet Commonly known as: IMDUR Take 30 mg by mouth daily.   metFORMIN 1000 MG tablet Commonly known as: GLUCOPHAGE Take 1,000 mg by mouth 2 (two) times daily.   nitroGLYCERIN 0.4 MG SL tablet Commonly known as: NITROSTAT Place 1 tablet (0.4 mg total) under the tongue every 5 (five) minutes as needed for chest pain.   ondansetron 8 MG tablet Commonly known as: ZOFRAN Take 8 mg by mouth every 8 (eight) hours as needed for nausea or vomiting.   pioglitazone 15 MG tablet Commonly known as: ACTOS Take 15 mg by mouth daily.   traMADol 50 MG tablet Commonly known as: ULTRAM Take 1-2 tablets (50-100 mg total) by mouth every 6 (six) hours as needed for moderate pain (pain score 4-6).   VITAMIN B-12 PO Take 1  tablet by mouth daily.   VITAMIN D-3 PO Take 1 tablet by mouth daily.  Discharge Care Instructions  (From admission, onward)           Start     Ordered   10/01/23 0000  Discharge wound care:       Comments: You may shower over your incisions.  Expect some mild bleeding or drainage from your old ostomy site.  It should continue to dry up.  May cover with a Band-Aid as needed.  If redness is getting markedly worse or drainage not controlled, please call our office to see if you may need to have it repacked.   10/01/23 1237   09/29/23 0000  Discharge wound care:       Comments: It is good for closed incisions and even open wounds to be washed every day.  Shower every day.  Short baths are fine.  Wash the incisions and wounds clean with soap & water.    You may leave closed incisions open to air if it is dry.   You may cover the incision with clean gauze & replace it after your daily shower for comfort.   09/29/23 0850            Significant Diagnostic Studies:  Results for orders placed or performed during the hospital encounter of 09/29/23 (from the past 72 hour(s))  Glucose, capillary     Status: Abnormal   Collection Time: 09/29/23  7:02 AM  Result Value Ref Range   Glucose-Capillary 116 (H) 70 - 99 mg/dL    Comment: Glucose reference range applies only to samples taken after fasting for at least 8 hours.   Comment 1 Notify RN    Comment 2 Document in Chart   Glucose, capillary     Status: Abnormal   Collection Time: 09/29/23  9:32 AM  Result Value Ref Range   Glucose-Capillary 131 (H) 70 - 99 mg/dL    Comment: Glucose reference range applies only to samples taken after fasting for at least 8 hours.  Surgical pathology     Status: None   Collection Time: 09/29/23  9:43 AM  Result Value Ref Range   SURGICAL PATHOLOGY      SURGICAL PATHOLOGY CASE: [id number] PATIENT: [name] [name] Surgical Pathology Report     Clinical History: Loop  ileostomy in place for fecal diversion (crm)     FINAL MICROSCOPIC DIAGNOSIS:  A. LOOP ILEOSTOMY:      Enterocutaneous tissue with reactive changes consistent with ostomy site changes.      Negative for dysplasia or malignancy.   Shadai Mcclane DESCRIPTION:  Received fresh is a 5.2 cm in length by 3.5 cm in diameter portion of bowel.  1 end is received with a staple line.  The opposite end is open, surrounded by a thin rim of tan wrinkled skin, consistent with an ostomy.  The external surface is minimally roughened with attached well lobulated yellow adipose.  The mucosa is tan and glistening without distinct lesions.  A representative section, including skin is submitted in 1 block. Renette Butters, 09/29/2023)    Final Diagnosis performed by Lance Coon, MD.   Electronically signed 09/30/2023 Technical component performed at [facility], [address] [place], [place] [address].  Professional component performed at [employer]. [facility], [address] [place], [place] [address].  Immunohistochemistry Technical component (if applicable) was performed at [facility]. [address], [place], [place] [address].   IMMUNOHISTOCHEMISTRY DISCLAIMER (if applicable): Some of these immunohistochemical stains may have been developed and the performance characteristics  determine by [facility]. Some may not have been cleared or approved by the U.S. Food and Drug Administration. The FDA has determined that such clearance or approval is not necessary. This test is used for clinical purposes. It should not be regarded as investigational or for research. This laboratory is certified under the Clinical Laboratory Improvement Amendments of 1988 (CLIA-88) as qualified to perform high complexity clinical laboratory testing.  The controls stained appropriately.   IHC stains  are performed on formalin fixed, paraffin embedded tissue using a  3,3"diaminobenzidine (DAB) chromogen and Leica Bond Autostainer System. The staining intensity of the nucleus is score manually and is reported as the percentage of tumor cell nuclei demonstrating specific nuclear staining. The specimens are fixed in 10% Neutral Formalin for at least 6 hours and up to 72hrs. These tests are validated on decalcified tissue. Results should be interpreted with caution given the possibility of false negative results on decalcified specimens. Antibody Clones are as follows ER-clone 64F, PR-clone 16, Ki67- clone MM1. Some of these immunohistochemical stains may have been developed and the performance characteristics determined by [facility].   Glucose, capillary     Status: Abnormal   Collection Time: 09/29/23 10:14 AM  Result Value Ref Range   Glucose-Capillary 144 (H) 70 - 99 mg/dL    Comment: Glucose reference range applies only to samples taken after fasting for at least 8 hours.  Basic metabolic panel     Status: Abnormal   Collection Time: 09/30/23  4:39 AM  Result Value Ref Range   Sodium 134 (L) 135 - 145 mmol/L   Potassium 5.1 3.5 - 5.1 mmol/L   Chloride 100 98 - 111 mmol/L   CO2 24 22 - 32 mmol/L   Glucose, Bld 181 (H) 70 - 99 mg/dL    Comment: Glucose reference range applies only to samples taken after fasting for at least 8 hours.   BUN 27 (H) 8 - 23 mg/dL   Creatinine, Ser 3.08 (H) 0.61 - 1.24 mg/dL   Calcium 8.9 8.9 - 65.7 mg/dL   GFR, Estimated 55 (L) >60 mL/min    Comment: (NOTE) Calculated using the CKD-EPI Creatinine Equation (2021)    Anion gap 10 5 - 15    Comment: Performed at [facility], [address] [place], [place] [address]  CBC     Status: Abnormal   Collection Time: 09/30/23  4:39 AM  Result Value Ref Range   WBC 9.9 4.0 - 10.5 K/uL   RBC 3.09 (L) 4.22 - 5.81 MIL/uL   Hemoglobin 9.5 (L) 13.0 - 17.0 g/dL   HCT 29.5 (L) 28.4 - 13.2 %   MCV 98.1 80.0 - 100.0 fL   MCH 30.7 26.0 - 34.0 pg    MCHC 31.4 30.0 - 36.0 g/dL   RDW 44.0 10.2 - 72.5 %   Platelets 149 (L) 150 - 400 K/uL   nRBC 0.0 0.0 - 0.2 %    Comment: Performed at [facility], [address] [place], [place] [address]  Magnesium     Status: None   Collection Time: 09/30/23  4:39 AM  Result Value Ref Range   Magnesium 2.0 1.7 - 2.4 mg/dL    Comment: Performed at [facility], [address], [place], [place] [address]    No results found.  Past Medical History:  Diagnosis Date   Anemia    Anemia of chronic disease 06/09/2023   Anginal pain (HCC)  Bradycardia    Cancer (HCC)    CKD stage 3b, GFR 30-44 ml/min (HCC) 05/21/2023   Coronary artery disease    x 1 stent   Depression    Diabetes mellitus without complication (HCC)    GERD (gastroesophageal reflux disease)    History of kidney stones    Hyperlipidemia    Hyperlipidemia associated with type 2 diabetes mellitus (HCC) 04/04/2010   Qualifier: Diagnosis of  By: [name] CMA, Carol     Hypertension    Nephrolithiasis    Partial Large bowel obstruction (HCC)    Rectal adenocarcinoma (HCC) 08/07/2022    Past Surgical History:  Procedure Laterality Date   CARDIAC CATHETERIZATION     CATARACT EXTRACTION     COLONOSCOPY WITH PROPOFOL N/A 05/05/2023   Procedure: COLONOSCOPY WITH PROPOFOL;  Surgeon: Regis Bill, MD;  Location: [facility] ENDOSCOPY;  Service: Endoscopy;  Laterality: N/A;  Patient having surgery tomorrow, 05/06/2023. Ok'd per [place] and office   CORONARY ANGIOPLASTY     EYE SURGERY     FLEXIBLE SIGMOIDOSCOPY N/A 07/27/2022   Procedure: FLEXIBLE SIGMOIDOSCOPY;  Surgeon: Regis Bill, MD;  Location: [facility] ENDOSCOPY;  Service: Endoscopy;  Laterality: N/A;   ILEOSTOMY CLOSURE N/A 09/29/2023   Procedure: OPEN TAKEDOWN OF LOOP ILEOSTOMY, LEFT TAP BLOCK;  Surgeon: [name], MD;  Location: [facility];  Service: General;  Laterality: N/A;   KIDNEY STONE SURGERY     NECK SURGERY  12/18/2011    nephrolithiasis     PORTACATH PLACEMENT Right 08/24/2022   Procedure: INSERTION PORT-A-CATH;  Surgeon: Campbell Lerner, MD;  Location: [facility] ORS;  Service: General;  Laterality: Right;   RECTAL EXAM UNDER ANESTHESIA N/A 09/29/2023   Procedure: RECTAL EXAM UNDER ANESTHESIA;  Surgeon: [name], MD;  Location: [facility];  Service: General;  Laterality: N/A;   stent (other)     TRANSVERSE LOOP COLOSTOMY N/A 07/28/2022   Procedure: TRANSVERSE LOOP COLOSTOMY;  Surgeon: Campbell Lerner, MD;  Location: [facility] OR[facility];  Service: General;  Laterality: N/A;   XI ROBOTIC ASSISTED LOWER ANTERIOR RESECTION N/A 05/06/2023   Procedure: XI ROBOTIC ASSISTED LOWER ANTERIOR RECTOSIGMOID RESECTION, TAKEDOWN OF OLD LOOP COLOSTOMY WITH ANASTOMOSIS, MOBILIZATION OF SPLENIC FLEXURE OF COLON, AND NEW DIVERTING LOOP ILEOSTOMY, INTRAOPERATIVE ASSESSMENT OF TISSUE VASCULAR PERFUSION USING ICG (INDOCYANINE GREEN) IMMUNOFLUORESCENCE, FLEXIBLE SIGMOIDOSCOPY;  Surgeon: [name], MD;  Location: [facility];  Service: General;  Lateralit    Social History   Socioeconomic History   Marital status: Married    Spouse name: Doris   Number of children: Not on file   Years of education: Not on file   Highest education level: Not on file  Occupational History   Not on file  Tobacco Use   Smoking status: Every Day    Current packs/day: 0.25    Average packs/day: 0.3 packs/day for 36.0 years (9.0 ttl pk-yrs)    Types: Cigarettes   Smokeless tobacco: Never   Tobacco comments:    Smokes 6 cigarettes daily   Vaping Use   Vaping status: Never Used  Substance and Sexual Activity   Alcohol use: No   Drug use: No   Sexual activity: Not on file  Other Topics Concern   Not on file  Social History Narrative   Not on file   Social Determinants of Health   Financial Resource Strain: Not on file  Food Insecurity: Patient Declined (09/29/2023)   Hunger Vital Sign    Worried About Running Out of Food in the Last Year: Patient  declined    Barista in the Last Year: Patient declined  Transportation Needs: Patient Declined (09/29/2023)   PRAPARE - Administrator, Civil Service (Medical): Patient declined    Lack of Transportation (Non-Medical): Patient declined  Physical Activity: Not on file  Stress: Not on file  Social Connections: Not on file  Intimate Partner Violence: Patient Declined (09/29/2023)   Humiliation, Afraid, Rape, and Kick questionnaire    Fear of Current or Ex-Partner: Patient declined    Emotionally Abused: Patient declined    Physically Abused: Patient declined    Sexually Abused: Patient declined    Family History  Problem Relation Age of Onset   Heart disease Father     Current Facility-Administered Medications  Medication Dose Route Frequency Provider Last Rate Last Admin   0.9 %  sodium chloride infusion  250 mL Intravenous PRN [name], MD       acetaminophen (TYLENOL) tablet 1,000 mg  1,000 mg Oral Trecia Rogers, MD   1,000 mg at 10/01/23 0524   alum & mag hydroxide-simeth (MAALOX/MYLANTA) 200-200-20 MG/5ML suspension 30 mL  30 mL Oral Q6H PRN [name], MD       alvimopan (ENTEREG) capsule 12 mg  12 mg Oral BID [name], MD   12 mg at 10/01/23 0911   aspirin EC tablet 81 mg  81 mg Oral Daily [name], MD   81 mg at 10/01/23 0911   atorvastatin (LIPITOR) tablet 40 mg  40 mg Oral Daily [name], MD   40 mg at 10/01/23 0911   bismuth subsalicylate (PEPTO BISMOL) 262 MG/15ML suspension 30 mL  30 mL Oral Q8H PRN [name], MD       Chlorhexidine Gluconate Cloth 2 % PADS 6 each  6 each Topical Once [name], MD       diphenhydrAMINE (BENADRYL) 12.5 MG/5ML elixir 12.5 mg  12.5 mg Oral Q6H PRN [name], MD       Or   diphenhydrAMINE (BENADRYL) injection 12.5 mg  12.5 mg Intravenous Q6H PRN [name], MD       enoxaparin (LOVENOX) injection 40 mg  40 mg Subcutaneous Q24H [name], MD   40 mg at 10/01/23 0911    famotidine (PEPCID) tablet 40 mg  40 mg Oral Daily [name], MD   40 mg at 10/01/23 0911   feeding supplement (ENSURE SURGERY) liquid 237 mL  237 mL Oral BID BM [name], MD   237 mL at 10/01/23 1024   ferrous sulfate tablet 325 mg  325 mg Oral BID WC [name], MD   325 mg at 10/01/23 0911   glimepiride (AMARYL) tablet 4 mg  4 mg Oral Q breakfast [name], MD   4 mg at 10/01/23 1610   hydrALAZINE (APRESOLINE) injection 10 mg  10 mg Intravenous Q2H PRN [name], MD       HYDROmorphone (DILAUDID) injection 0.5-2 mg  0.5-2 mg Intravenous Q4H PRN [name], MD       isosorbide mononitrate (IMDUR) 24 hr tablet 30 mg  30 mg Oral Daily [name], MD   30 mg at 10/01/23 0911   lactated ringers infusion   Intravenous Q8H PRN [name], MD       magic mouthwash  15 mL Oral QID PRN [name], MD       melatonin tablet 3 mg  3 mg Oral QHS PRN [name], MD       menthol-cetylpyridinium (  CEPACOL) lozenge 3 mg  1 lozenge Oral PRN [name], MD       metFORMIN (GLUCOPHAGE) tablet 500 mg  500 mg Oral BID WC [name], MD   500 mg at 10/01/23 0910   methocarbamol (ROBAXIN) injection 1,000 mg  1,000 mg Intravenous Q6H PRN [name], MD       methocarbamol (ROBAXIN) tablet 1,000 mg  1,000 mg Oral Q6H PRN [name], MD   1,000 mg at 09/29/23 2010   metoprolol tartrate (LOPRESSOR) injection 5 mg  5 mg Intravenous Q6H PRN [name], MD       naphazoline-glycerin (CLEAR EYES REDNESS) ophth solution 1-2 drop  1-2 drop Both Eyes QID PRN [name], MD       nitroGLYCERIN (NITROSTAT) SL tablet 0.4 mg  0.4 mg Sublingual Q5 min PRN [name], MD       ondansetron [facility]) tablet 4 mg  4 mg Oral Q6H PRN [name], MD       Or   ondansetron [facility]) injection 4 mg  4 mg Intravenous Q6H PRN [name], MD       phenol (CHLORASEPTIC) mouth spray 2 spray  2 spray Mouth/Throat PRN [name], MD       pioglitazone (ACTOS) tablet 15 mg  15 mg Oral  Daily [name], MD   15 mg at 10/01/23 0911   polycarbophil (FIBERCON) tablet 625 mg  625 mg Oral BID [name], MD   625 mg at 10/01/23 9147   prochlorperazine (COMPAZINE) tablet 10 mg  10 mg Oral Q6H PRN [name], MD       Or   prochlorperazine (COMPAZINE) injection 5-10 mg  5-10 mg Intravenous Q6H PRN [name], MD       simethicone (MYLICON) chewable tablet 40 mg  40 mg Oral Q6H PRN [name], MD       sodium chloride (OCEAN) 0.65 % nasal spray 1-2 spray  1-2 spray Each Nare Q6H PRN [name], MD       sodium chloride flush (NS) 0.9 % injection 3 mL  3 mL Intravenous Catha Gosselin, MD   3 mL at 10/01/23 1024   sodium chloride flush (NS) 0.9 % injection 3 mL  3 mL Intravenous PRN [name], MD       traMADol Janean Sark) tablet 50-100 mg  50-100 mg Oral Q6H PRN [name], MD   100 mg at 10/01/23 0920     No Known Allergies  Signed:   Ardeth Sportsman, MD, FACS, MASCRS Esophageal, Gastrointestinal & Colorectal Surgery Robotic and Minimally Invasive Surgery  [facility] [address] [address] [place], [place] [address] [phone] Fax [phone] Main  CONTACT INFORMATION: Weekday (9AM-5PM): Call [facility] main office at [phone] Weeknight (5PM-9AM) or Weekend/Holiday: Check EPIC "Web Links" tab & use "AMION" (password " [email or web address]") for General Surgery [facility] coverage  Please, DO NOT use SecureChat  (it is not reliable communication to reach operating surgeons & will lead to a delay in care).   Epic staff messaging available for outptient concerns needing 1-2 business day response.      10/01/2023, 12:37 PM

## 2023-10-01 NOTE — Progress Notes (Signed)
Discharge instructions discussed with patient, verbalized agreement and understanding 

## 2023-10-01 NOTE — Progress Notes (Signed)
Mobility Specialist - Progress Note   10/01/23 0950  Mobility  Activity Ambulated independently in hallway  Level of Assistance Independent  Assistive Device None  Distance Ambulated (ft) 500 ft  Activity Response Tolerated well  Mobility Referral Yes  $Mobility charge 1 Mobility  Mobility Specialist Start Time (ACUTE ONLY) [id number]  Mobility Specialist Stop Time (ACUTE ONLY) 0945  Mobility Specialist Time Calculation (min) (ACUTE ONLY) 8 min   Pt received in bed and agreeable to mobility. No complaints during session. Pt to bed after session with all needs met.    [facility]

## 2023-10-01 NOTE — Telephone Encounter (Signed)
Pt wife called and wanted to know pt appts. I let her know pt is scheduled for CT today and she was unaware and stated that pt is at [facility] currently and wants the CT canceled.  Ct has been canceled. Pt spouse said they will keept the port flush and MD appt scheduled 11/22. Pt spouse stated that if MD wants CT r/s they will do that. Please call and let them know.

## 2023-10-08 ENCOUNTER — Inpatient Hospital Stay: Payer: Medicare HMO

## 2023-10-08 ENCOUNTER — Inpatient Hospital Stay: Payer: Medicare HMO | Admitting: Oncology

## 2023-10-13 ENCOUNTER — Other Ambulatory Visit: Payer: Medicare HMO

## 2023-10-18 ENCOUNTER — Ambulatory Visit
Admission: RE | Admit: 2023-10-18 | Discharge: 2023-10-18 | Disposition: A | Discharge status: Home / Self Care | Payer: Medicare HMO | Source: Ambulatory Visit | Attending: Oncology | Admitting: Oncology

## 2023-10-18 DIAGNOSIS — J432 Centrilobular emphysema: Secondary | ICD-10-CM | POA: Diagnosis not present

## 2023-10-18 DIAGNOSIS — C20 Malignant neoplasm of rectum: Secondary | ICD-10-CM | POA: Diagnosis not present

## 2023-10-18 MED ORDER — IOHEXOL 300 MG/ML  SOLN
100.0000 mL | Freq: Once | INTRAMUSCULAR | Status: AC | PRN
Start: 2023-10-18 — End: 2023-10-18
  Administered 2023-10-18: 100 mL via INTRAVENOUS

## 2023-10-25 ENCOUNTER — Telehealth: Payer: Self-pay | Admitting: *Deleted

## 2023-10-25 ENCOUNTER — Ambulatory Visit: Payer: Medicare HMO | Admitting: Oncology

## 2023-10-25 ENCOUNTER — Other Ambulatory Visit: Payer: Self-pay | Admitting: *Deleted

## 2023-10-25 DIAGNOSIS — C20 Malignant neoplasm of rectum: Secondary | ICD-10-CM

## 2023-10-25 NOTE — Telephone Encounter (Signed)
Called report  IMPRESSION: 1. Interval takedown of left-sided ileostomy with 8.8 x 3.1 cm subcutaneous hematoma at takedown site. 2. No typical findings of metastatic disease within the chest, abdomen, or pelvis. 3. Increase in right lower lobe reticulonodular opacities, favoring acute on chronic infection or aspiration. 4. 3 mm right middle lobe pulmonary nodule is similar to 08/07/2022 and likely benign. 5. Incidental findings, including: Right nephrolithiasis. Mild prostatomegaly. Coronary artery atherosclerosis. Aortic Atherosclerosis (ICD10-I70.0). Emphysema (ICD10-J43.9).   These results will be called to the ordering clinician or representative by the Radiologist Assistant, and communication documented in the PACS or [employer].     Electronically Signed   By: Jeronimo Greaves M.D.   On: 10/24/2023 13:33

## 2023-10-26 ENCOUNTER — Encounter: Payer: Self-pay | Admitting: Oncology

## 2023-10-26 ENCOUNTER — Inpatient Hospital Stay: Payer: Medicare HMO | Attending: Oncology

## 2023-10-26 ENCOUNTER — Inpatient Hospital Stay (HOSPITAL_BASED_OUTPATIENT_CLINIC_OR_DEPARTMENT_OTHER): Payer: Medicare HMO | Admitting: Oncology

## 2023-10-26 VITALS — BP 126/73 | HR 88 | Temp 96.6°F | Resp 16 | Ht 72.0 in | Wt 175.0 lb

## 2023-10-26 DIAGNOSIS — F17210 Nicotine dependence, cigarettes, uncomplicated: Secondary | ICD-10-CM | POA: Diagnosis not present

## 2023-10-26 DIAGNOSIS — D649 Anemia, unspecified: Secondary | ICD-10-CM | POA: Diagnosis not present

## 2023-10-26 DIAGNOSIS — C20 Malignant neoplasm of rectum: Secondary | ICD-10-CM | POA: Diagnosis not present

## 2023-10-26 DIAGNOSIS — E1165 Type 2 diabetes mellitus with hyperglycemia: Secondary | ICD-10-CM | POA: Insufficient documentation

## 2023-10-26 DIAGNOSIS — Z95828 Presence of other vascular implants and grafts: Secondary | ICD-10-CM

## 2023-10-26 DIAGNOSIS — N289 Disorder of kidney and ureter, unspecified: Secondary | ICD-10-CM | POA: Insufficient documentation

## 2023-10-26 LAB — CMP (CANCER CENTER ONLY)
ALT: 10 U/L (ref 0–44)
AST: 20 U/L (ref 15–41)
Albumin: 3.6 g/dL (ref 3.5–5.0)
Alkaline Phosphatase: 64 U/L (ref 38–126)
Anion gap: 11 (ref 5–15)
BUN: 28 mg/dL — ABNORMAL HIGH (ref 8–23)
CO2: 23 mmol/L (ref 22–32)
Calcium: 9.2 mg/dL (ref 8.9–10.3)
Chloride: 104 mmol/L (ref 98–111)
Creatinine: 1.47 mg/dL — ABNORMAL HIGH (ref 0.61–1.24)
GFR, Estimated: 49 mL/min — ABNORMAL LOW (ref >60)
Glucose, Bld: 144 mg/dL — ABNORMAL HIGH (ref 70–99)
Potassium: 4.4 mmol/L (ref 3.5–5.1)
Sodium: 138 mmol/L (ref 135–145)
Total Bilirubin: 0.5 mg/dL (ref <1.2)
Total Protein: 6.4 g/dL — ABNORMAL LOW (ref 6.5–8.1)

## 2023-10-26 LAB — CBC WITH DIFFERENTIAL/PLATELET
Abs Immature Granulocytes: 0.02 10*3/uL (ref 0.00–0.07)
Basophils Absolute: 0 10*3/uL (ref 0.0–0.1)
Basophils Relative: 1 %
Eosinophils Absolute: 0.3 10*3/uL (ref 0.0–0.5)
Eosinophils Relative: 4 %
HCT: 27.7 % — ABNORMAL LOW (ref 39.0–52.0)
Hemoglobin: 8.9 g/dL — ABNORMAL LOW (ref 13.0–17.0)
Immature Granulocytes: 0 %
Lymphocytes Relative: 7 %
Lymphs Abs: 0.4 10*3/uL — ABNORMAL LOW (ref 0.7–4.0)
MCH: 30.7 pg (ref 26.0–34.0)
MCHC: 32.1 g/dL (ref 30.0–36.0)
MCV: 95.5 fL (ref 80.0–100.0)
Monocytes Absolute: 0.5 10*3/uL (ref 0.1–1.0)
Monocytes Relative: 8 %
Neutro Abs: 4.8 10*3/uL (ref 1.7–7.7)
Neutrophils Relative %: 80 %
Platelets: 174 10*3/uL (ref 150–400)
RBC: 2.9 MIL/uL — ABNORMAL LOW (ref 4.22–5.81)
RDW: 14.6 % (ref 11.5–15.5)
WBC: 6 10*3/uL (ref 4.0–10.5)
nRBC: 0 % (ref 0.0–0.2)

## 2023-10-26 MED ORDER — HEPARIN SOD (PORK) LOCK FLUSH 100 UNIT/ML IV SOLN
500.0000 [IU] | Freq: Once | INTRAVENOUS | Status: AC
  Administered 2023-10-26: 500 [IU] via INTRAVENOUS
  Filled 2023-10-26: qty 5

## 2023-10-26 MED ORDER — SODIUM CHLORIDE 0.9% FLUSH
10.0000 mL | INTRAVENOUS | Status: DC | PRN
  Administered 2023-10-26: 10 mL via INTRAVENOUS
  Filled 2023-10-26: qty 10

## 2023-10-26 NOTE — Progress Notes (Signed)
[facility]  Telephone:([phone]) [phone] [phone]  ID: [name] [name] OB: Sep 14, 1946  MR#: [id number]  AOZ#:[id number]  Patient Care Team: Miki Kins, FNP as PCP - General (Family Medicine) Mariah Milling Tollie Pizza, MD as PCP - Cardiology (Cardiology) Antonieta Iba, MD as Consulting Physician (Cardiology) Benita Gutter, RN as Oncology Nurse Navigator Orlie Dakin, Tollie Pizza, MD as Consulting Physician (Oncology) [name], MD as Consulting Physician (Colon and Rectal Surgery)  CHIEF COMPLAINT: Pathologic stage IIa rectal adenocarcinoma.  INTERVAL HISTORY: Patient returns to clinic today for repeat laboratory work, further evaluation, and discussion of his imaging results.  He recently underwent his ileostomy takedown approximately 3 weeks ago.  He was initially having significant diarrhea, but this has improved.  He continues to have mild surgical site tenderness, but this is improved as well.  He has no neurologic complaints.  He denies any recent fevers or illnesses.  He has no chest pain, shortness of breath, cough, or hemoptysis.  He denies any nausea, vomiting, or constipation.  He does not report any melena or hematochezia.  He has no urinary complaints.  Patient offers no further specific complaints today.  REVIEW OF SYSTEMS:   Review of Systems  Constitutional:  Positive for malaise/fatigue. Negative for fever and weight loss.  Respiratory: Negative.  Negative for cough, hemoptysis and shortness of breath.   Cardiovascular: Negative.  Negative for chest pain and leg swelling.  Gastrointestinal:  Positive for diarrhea. Negative for abdominal pain, blood in stool and melena.  Genitourinary: Negative.  Negative for dysuria.  Musculoskeletal: Negative.  Negative for back pain.  Skin: Negative.  Negative for rash.  Neurological:  Positive for weakness. Negative for dizziness, focal weakness and headaches.  Psychiatric/Behavioral: Negative.  The  patient is not nervous/anxious.     As per HPI. Otherwise, a complete review of systems is negative.  PAST MEDICAL HISTORY: Past Medical History:  Diagnosis Date   Anemia    Anemia of chronic disease 06/09/2023   Anginal pain (HCC)    Bradycardia    Cancer (HCC)    CKD stage 3b, GFR 30-44 ml/min (HCC) 05/21/2023   Coronary artery disease    x 1 stent   Depression    Diabetes mellitus without complication (HCC)    GERD (gastroesophageal reflux disease)    History of kidney stones    Hyperlipidemia    Hyperlipidemia associated with type 2 diabetes mellitus (HCC) 04/04/2010   Qualifier: Diagnosis of  By: [name], CMA, Carol     Hypertension    Nephrolithiasis    Partial Large bowel obstruction (HCC)    Rectal adenocarcinoma (HCC) 08/07/2022    PAST SURGICAL HISTORY: Past Surgical History:  Procedure Laterality Date   CARDIAC CATHETERIZATION     CATARACT EXTRACTION     COLONOSCOPY WITH PROPOFOL N/A 05/05/2023   Procedure: COLONOSCOPY WITH PROPOFOL;  Surgeon: Regis Bill, MD;  Location: [facility] END[facility];  Service: Endoscopy;  Laterality: N/A;  Patient having surgery tomorrow, 05/06/2023. Ok'd per [place] and office   CORONARY ANGIOPLASTY     EYE SURGERY     FLEXIBLE SIGMOIDOSCOPY N/A 07/27/2022   Procedure: FLEXIBLE SIGMOIDOSCOPY;  Surgeon: Regis Bill, MD;  Location: [facility] END[facility];  Service: Endoscopy;  Laterality: N/A;   ILEOSTOMY CLOSURE N/A 09/29/2023   Procedure: OPEN TAKEDOWN OF LOOP ILEOSTOMY, LEFT TAP BLOCK;  Surgeon: [name], MD;  Location: [facility];  Service: General;  Laterality: N/A;   KIDNEY STONE SURGERY     NECK SURGERY  12/18/2011   nephrolithiasis     PORTACATH PLACEMENT Right 08/24/2022   Procedure: INSERTION PORT-A-CATH;  Surgeon: Campbell Lerner, MD;  Location: [facility] ORS;  Service: General;  Laterality: Right;   RECTAL EXAM UNDER ANESTHESIA N/A 09/29/2023   Procedure: RECTAL EXAM UNDER ANESTHESIA;  Surgeon: [name], MD;  Location: [facility]  [facility];  Service: General;  Laterality: N/A;   stent (other)     TRANSVERSE LOOP COLOSTOMY N/A 07/28/2022   Procedure: TRANSVERSE LOOP COLOSTOMY;  Surgeon: Campbell Lerner, MD;  Location: [facility] ORS;  Service: General;  Laterality: N/A;   XI ROBOTIC ASSISTED LOWER ANTERIOR RESECTION N/A 05/06/2023   Procedure: XI ROBOTIC ASSISTED LOWER ANTERIOR RECTOSIGMOID RESECTION, TAKEDOWN OF OLD LOOP COLOSTOMY WITH ANASTOMOSIS, MOBILIZATION OF SPLENIC FLEXURE OF COLON, AND NEW DIVERTING LOOP ILEOSTOMY, INTRAOPERATIVE ASSESSMENT OF TISSUE VASCULAR PERFUSION USING ICG (INDOCYANINE GREEN) IMMUNOFLUORESCENCE, FLEXIBLE SIGMOIDOSCOPY;  Surgeon: [name], MD;  Location: [facility];  Service: General;  Lateralit    FAMILY HISTORY: Family History  Problem Relation Age of Onset   Heart disease Father     ADVANCED DIRECTIVES (Y/N):  N  HEALTH MAINTENANCE: Social History   Tobacco Use   Smoking status: Every Day    Current packs/day: 0.25    Average packs/day: 0.3 packs/day for 36.0 years (9.0 ttl pk-yrs)    Types: Cigarettes   Smokeless tobacco: Never   Tobacco comments:    Smokes 6 cigarettes daily   Vaping Use   Vaping status: Never Used  Substance Use Topics   Alcohol use: No   Drug use: No     Colonoscopy:  PAP:  Bone density:  Lipid panel:  No Known Allergies  Current Outpatient Medications  Medication Sig Dispense Refill   aspirin 81 MG EC tablet Take 81 mg by mouth daily.       atorvastatin (LIPITOR) 40 MG tablet Take 1 tablet (40 mg total) by mouth daily. 90 tablet 3   bisacodyl (DULCOLAX) 5 MG EC tablet Take 5 mg by mouth daily as needed for moderate constipation.     Cholecalciferol (VITAMIN D-3 PO) Take 1 tablet by mouth daily.     Cyanocobalamin (VITAMIN B-12 PO) Take 1 tablet by mouth daily.     famotidine (PEPCID) 40 MG tablet Take 1 tablet (40 mg total) by mouth daily. 90 tablet 3   ferrous sulfate 325 (65 FE) MG tablet Take 1 tablet by mouth 2 (two) times daily with a meal.      glimepiride (AMARYL) 4 MG tablet Take 4 mg by mouth daily with breakfast.     isosorbide mononitrate (IMDUR) 30 MG 24 hr tablet Take 30 mg by mouth daily.     loperamide (IMODIUM) 2 MG capsule Take 2 mg by mouth as needed for diarrhea or loose stools.     metFORMIN (GLUCOPHAGE) 1000 MG tablet Take 1,000 mg by mouth 2 (two) times daily.     nitroGLYCERIN (NITROSTAT) 0.4 MG SL tablet Place 1 tablet (0.4 mg total) under the tongue every 5 (five) minutes as needed for chest pain. 25 tablet 6   ondansetron (ZOFRAN) 8 MG tablet Take 8 mg by mouth every 8 (eight) hours as needed for nausea or vomiting.     pioglitazone (ACTOS) 15 MG tablet Take 15 mg by mouth daily.     traMADol (ULTRAM) 50 MG tablet Take 1-2 tablets (50-100 mg total) by mouth every 6 (six) hours as needed for moderate pain (pain score 4-6). 20 tablet 0   diphenoxylate-atropine (LOMOTIL) 2.5-0.025 MG  tablet Take 1 tablet by mouth 2 (two) times daily. (Patient not taking: Reported on 09/17/2023) 30 tablet 5   No current facility-administered medications for this visit.   Facility-Administered Medications Ordered in Other Visits  Medication Dose Route Frequency Provider Last Rate Last Admin   sodium chloride flush (NS) 0.9 % injection 10 mL  10 mL Intravenous PRN Jeralyn Ruths, MD   10 mL at 10/26/23 1043    OBJECTIVE: Vitals:   10/26/23 1056  BP: 126/73  Pulse: 88  Resp: 16  Temp: (!) 96.6 F (35.9 C)  SpO2: 98%      Body mass index is 23.73 kg/m.    ECOG FS:0 - Asymptomatic  General: Well-developed, well-nourished, no acute distress. Eyes: Pink conjunctiva, anicteric sclera. HEENT: Normocephalic, moist mucous membranes. Lungs: No audible wheezing or coughing. Heart: Regular rate and rhythm. Abdomen: Soft, nontender, no obvious distention. Musculoskeletal: No edema, cyanosis, or clubbing. Neuro: Alert, answering all questions appropriately. Cranial nerves grossly intact. Skin: No rashes or petechiae noted. Psych:  Normal affect.  LAB RESULTS:  Lab Results  Component Value Date   NA 138 10/26/2023   K 4.4 10/26/2023   CL 104 10/26/2023   CO2 23 10/26/2023   GLUCOSE 144 (H) 10/26/2023   BUN 28 (H) 10/26/2023   CREATININE 1.47 (H) 10/26/2023   CALCIUM 9.2 10/26/2023   PROT 6.4 (L) 10/26/2023   ALBUMIN 3.6 10/26/2023   AST 20 10/26/2023   ALT 10 10/26/2023   ALKPHOS 64 10/26/2023   BILITOT 0.5 10/26/2023   GFRNONAA 49 (L) 10/26/2023   GFRAA >60 12/20/2014    Lab Results  Component Value Date   WBC 6.0 10/26/2023   NEUTROABS 4.8 10/26/2023   HGB 8.9 (L) 10/26/2023   HCT 27.7 (L) 10/26/2023   MCV 95.5 10/26/2023   PLT 174 10/26/2023     STUDIES: CT CHEST ABDOMEN PELVIS W CONTRAST  Result Date: 10/24/2023 CLINICAL DATA:  Follow-up of rectal cancer. Ileostomy takedown 09/29/2023. Low anterior resection 05/06/2023. * Tracking Code: BO * EXAM: CT CHEST, ABDOMEN, AND PELVIS WITH CONTRAST TECHNIQUE: Multidetector CT imaging of the chest, abdomen and pelvis was performed following the standard protocol during bolus administration of intravenous contrast. RADIATION DOSE REDUCTION: This exam was performed according to the departmental dose-optimization program which includes automated exposure control, adjustment of the mA and/or kV according to patient size and/or use of iterative reconstruction technique. CONTRAST:  OMNIPAQUE IOHEXOL 300 MG/ML  SOLN COMPARISON:  Staging chest CT 08/07/2022. Staging abdominopelvic CT 07/26/2022 FINDINGS: CT CHEST FINDINGS Cardiovascular: Aortic atherosclerosis. Tortuous thoracic aorta. Normal heart size, without pericardial effusion. Left main and 3 vessel coronary artery calcification. No central pulmonary embolism, on this non-dedicated study. Mediastinum/Nodes: No mediastinal or hilar adenopathy. Lungs/Pleura: No pleural fluid. Mild centrilobular emphysema. 3 mm central right middle lobe pulmonary nodule on 115/4 is similar. Right lower lobe reticulonodular  opacity is increased since 07/18/2022 including on 119/4. No evidence of pulmonary metastasis. Musculoskeletal: Included within the abdomen pelvic section. CT ABDOMEN PELVIS FINDINGS Hepatobiliary: Normal liver. Mild gallbladder distension without calcified stone, acute inflammation, or biliary duct dilatation. Pancreas: Normal, without mass or ductal dilatation. Spleen: Normal in size, without focal abnormality. Adrenals/Urinary Tract: Normal adrenal glands. Interpolar right renal scarring. Punctate inter/lower pole right renal collecting system calculus. Bilateral renal sinus cysts, without hydronephrosis. Normal urinary bladder. Stomach/Bowel: Proximal gastric underdistention. Interval ileostomy takedown with rectosigmoid anastomosis. No anastomotic breakdown. Scattered colonic diverticula. Surgical sutures within the distal transverse colon. Normal terminal ileum. Vascular/Lymphatic:  Advanced aortic and branch vessel atherosclerosis. No abdominopelvic adenopathy. Reproductive: Mild prostatomegaly. Other: No significant free fluid. No free intraperitoneal air. Presacral soft tissue thickening is relatively diffuse, similar, and likely treatment related. No evidence of omental or peritoneal disease. Musculoskeletal: A subcutaneous heterogeneous fluid collection, likely hematoma, is at the site of prior antrostomy. Example 8.8 x 3.1 cm on 81/2. Mild-to-moderate right hip osteoarthritis.  Cervical spine fixation. IMPRESSION: 1. Interval takedown of left-sided ileostomy with 8.8 x 3.1 cm subcutaneous hematoma at takedown site. 2. No typical findings of metastatic disease within the chest, abdomen, or pelvis. 3. Increase in right lower lobe reticulonodular opacities, favoring acute on chronic infection or aspiration. 4. 3 mm right middle lobe pulmonary nodule is similar to 08/07/2022 and likely benign. 5. Incidental findings, including: Right nephrolithiasis. Mild prostatomegaly. Coronary artery atherosclerosis.  Aortic Atherosclerosis (ICD10-I70.0). Emphysema (ICD10-J43.9). These results will be called to the ordering clinician or representative by the Radiologist Assistant, and communication documented in the PACS or [employer]. Electronically Signed   By: Jeronimo Greaves M.D.   On: 10/24/2023 13:33    ASSESSMENT: Pathologic stage IIa rectal adenocarcinoma.  PLAN:    Pathologic stage IIa rectal adenocarcinoma: Diagnosis confirmed by imaging and biopsy.  MRI and CT scan results reviewed independently confirming stage of disease.  Patient completed neoadjuvant FOLFOX on December 30, 2022 and neoadjuvant Xeloda/XRT on February 17, 2023.  He underwent surgical resection on May 06, 2023 and was noted to have residual disease.  By report he had clear margins with 0 of 10 lymph nodes positive for malignancy.  He underwent ileostomy takedown on September 29, 2023.  CT scan results from October 24, 2023 reviewed independently and reported as above with no obvious evidence of recurrent or progressive disease.  Return to clinic in 3 months with repeat laboratory work, imaging, and further evaluation.  If imaging remains stable, likely can be transition to every 6 months.   Anemia: Hemoglobin trended down to 8.9 postoperatively, but patient noted to have a large hematoma on imaging.  Monitor. Hyperglycemia: Patient has improved blood glucose control.  Continue monitoring and treatment per primary care. Renal insufficiency: Creatinine slightly worse today at 1.47.  Patient states his p.o. intake is improving.  Encouraged increased fluid intake as well.   Patient expressed understanding and was in agreement with this plan. He also understands that He can call clinic at any time with any questions, concerns, or complaints.    Cancer Staging  Rectal adenocarcinoma [facility]) Staging form: Colon and Rectum, AJCC 8th Edition - Clinical stage from 08/13/2022: Stage IIA (cT3, cN0, cM0) - Signed by Jeralyn Ruths, MD on  08/13/2022 Total positive nodes: 0 - Pathologic stage from 05/24/2023: Stage IIA (ypT3, pN0, cM0) - Signed by Jeralyn Ruths, MD on 05/24/2023 Stage prefix: Post-therapy Total positive nodes: 0  Jeralyn Ruths, MD   10/26/2023 1:42 PM

## 2023-10-27 LAB — CEA: CEA: 4.7 ng/mL (ref 0.0–4.7)

## 2023-11-04 ENCOUNTER — Other Ambulatory Visit: Payer: Self-pay

## 2023-11-25 DIAGNOSIS — E1159 Type 2 diabetes mellitus with other circulatory complications: Secondary | ICD-10-CM | POA: Diagnosis not present

## 2023-11-25 DIAGNOSIS — I152 Hypertension secondary to endocrine disorders: Secondary | ICD-10-CM | POA: Diagnosis not present

## 2023-11-25 DIAGNOSIS — E1169 Type 2 diabetes mellitus with other specified complication: Secondary | ICD-10-CM | POA: Diagnosis not present

## 2023-11-25 DIAGNOSIS — E1142 Type 2 diabetes mellitus with diabetic polyneuropathy: Secondary | ICD-10-CM | POA: Diagnosis not present

## 2023-11-25 DIAGNOSIS — E785 Hyperlipidemia, unspecified: Secondary | ICD-10-CM | POA: Diagnosis not present

## 2023-12-10 ENCOUNTER — Ambulatory Visit (INDEPENDENT_AMBULATORY_CARE_PROVIDER_SITE_OTHER): Payer: Medicare HMO | Admitting: Family

## 2023-12-10 ENCOUNTER — Encounter: Payer: Self-pay | Admitting: Family

## 2023-12-10 VITALS — BP 158/78 | HR 72 | Ht 72.0 in | Wt 188.6 lb

## 2023-12-10 DIAGNOSIS — E1159 Type 2 diabetes mellitus with other circulatory complications: Secondary | ICD-10-CM

## 2023-12-10 DIAGNOSIS — E785 Hyperlipidemia, unspecified: Secondary | ICD-10-CM

## 2023-12-10 DIAGNOSIS — E1169 Type 2 diabetes mellitus with other specified complication: Secondary | ICD-10-CM

## 2023-12-10 DIAGNOSIS — Z1159 Encounter for screening for other viral diseases: Secondary | ICD-10-CM | POA: Diagnosis not present

## 2023-12-10 DIAGNOSIS — E559 Vitamin D deficiency, unspecified: Secondary | ICD-10-CM

## 2023-12-10 DIAGNOSIS — R799 Abnormal finding of blood chemistry, unspecified: Secondary | ICD-10-CM

## 2023-12-10 DIAGNOSIS — N1832 Chronic kidney disease, stage 3b: Secondary | ICD-10-CM

## 2023-12-10 DIAGNOSIS — E538 Deficiency of other specified B group vitamins: Secondary | ICD-10-CM | POA: Diagnosis not present

## 2023-12-10 DIAGNOSIS — I152 Hypertension secondary to endocrine disorders: Secondary | ICD-10-CM | POA: Diagnosis not present

## 2023-12-10 DIAGNOSIS — R5383 Other fatigue: Secondary | ICD-10-CM

## 2023-12-10 DIAGNOSIS — E1142 Type 2 diabetes mellitus with diabetic polyneuropathy: Secondary | ICD-10-CM

## 2023-12-10 DIAGNOSIS — R300 Dysuria: Secondary | ICD-10-CM | POA: Diagnosis not present

## 2023-12-10 LAB — POCT URINALYSIS DIPSTICK
Bilirubin, UA: NEGATIVE
Glucose, UA: NEGATIVE
Ketones, UA: NEGATIVE
Leukocytes, UA: NEGATIVE
Nitrite, UA: NEGATIVE
Protein, UA: POSITIVE — AB
Spec Grav, UA: 1.025 (ref 1.010–1.025)
Urobilinogen, UA: 0.2 U/dL
pH, UA: 5.5 (ref 5.0–8.0)

## 2023-12-10 NOTE — Assessment & Plan Note (Signed)
Blood pressure well controlled with current medications.  Continue current therapy.  Will reassess at follow up.

## 2023-12-10 NOTE — Progress Notes (Signed)
Established Patient Office Visit  Subjective:  Patient ID: [name] [name], male    DOB: [birth year]  Age: 78 y.o. MRN: [id number]  Chief Complaint  Patient presents with   Follow-up    4 month follow up    Patient is here today for his 4 months follow up.  He has been feeling fairly well since last appointment.   He does have additional concerns to discuss today.  He has had his ileostomy take down completed, and he is using the bathroom normally.  He has had some additional concerns outside of his health, mostly to do with his wife's vehicle.   Labs are due today. He needs refills.   I have reviewed his active problem list, medication list, allergies, health maintenance, notes from last encounter, lab results for his appointment today.      No other concerns at this time.   Past Medical History:  Diagnosis Date   Anemia    Anemia of chronic disease 06/09/2023   Anginal pain (HCC)    Bradycardia    Cancer (HCC)    CKD stage 3b, GFR 30-44 ml/min (HCC) 05/21/2023   Coronary artery disease    x 1 stent   Depression    Diabetes mellitus without complication (HCC)    GERD (gastroesophageal reflux disease)    History of kidney stones    Hyperlipidemia    Hyperlipidemia associated with type 2 diabetes mellitus (HCC) 04/04/2010   Qualifier: Diagnosis of  By: [name] CMA, Carol     Hypertension    Nephrolithiasis    Partial Large bowel obstruction (HCC)    Rectal adenocarcinoma (HCC) 08/07/2022    Past Surgical History:  Procedure Laterality Date   CARDIAC CATHETERIZATION     CATARACT EXTRACTION     COLONOSCOPY WITH PROPOFOL N/A 05/05/2023   Procedure: COLONOSCOPY WITH PROPOFOL;  Surgeon: Regis Bill, MD;  Location: [facility];  Service: Endoscopy;  Laterality: N/A;  Patient having surgery tomorrow, 05/06/2023. Ok'd per [place] and office   CORONARY ANGIOPLASTY     EYE SURGERY     FLEXIBLE SIGMOIDOSCOPY N/A 07/27/2022   Procedure: FLEXIBLE  SIGMOIDOSCOPY;  Surgeon: Regis Bill, MD;  Location: [facility];  Service: Endoscopy;  Laterality: N/A;   ILEOSTOMY CLOSURE N/A 09/29/2023   Procedure: OPEN TAKEDOWN OF LOOP ILEOSTOMY, LEFT TAP BLOCK;  Surgeon: [name], MD;  Location: [facility];  Service: General;  Laterality: N/A;   KIDNEY STONE SURGERY     NECK SURGERY  12/18/2011   nephrolithiasis     PORTACATH PLACEMENT Right 08/24/2022   Procedure: INSERTION PORT-A-CATH;  Surgeon: Campbell Lerner, MD;  Location: [facility];  Service: General;  Laterality: Right;   RECTAL EXAM UNDER ANESTHESIA N/A 09/29/2023   Procedure: RECTAL EXAM UNDER ANESTHESIA;  Surgeon: [name], MD;  Location: [facility];  Service: General;  Laterality: N/A;   stent (other)     TRANSVERSE LOOP COLOSTOMY N/A 07/28/2022   Procedure: TRANSVERSE LOOP COLOSTOMY;  Surgeon: Campbell Lerner, MD;  Location: [facility];  Service: General;  Laterality: N/A;   XI ROBOTIC ASSISTED LOWER ANTERIOR RESECTION N/A 05/06/2023   Procedure: XI ROBOTIC ASSISTED LOWER ANTERIOR RECTOSIGMOID RESECTION, TAKEDOWN OF OLD LOOP COLOSTOMY WITH ANASTOMOSIS, MOBILIZATION OF SPLENIC FLEXURE OF COLON, AND NEW DIVERTING LOOP ILEOSTOMY, INTRAOPERATIVE ASSESSMENT OF TISSUE VASCULAR PERFUSION USING ICG (INDOCYANINE GREEN) IMMUNOFLUORESCENCE, FLEXIBLE SIGMOIDOSCOPY;  Surgeon: [name], MD;  Location: [facility];  Service: Cheron Every    Social History   Socioeconomic History  Marital status: Married    Spouse name: Doris   Number of children: Not on file   Years of education: Not on file   Highest education level: Not on file  Occupational History   Not on file  Tobacco Use   Smoking status: Every Day    Current packs/day: 0.25    Average packs/day: 0.3 packs/day for 36.0 years (9.0 ttl pk-yrs)    Types: Cigarettes   Smokeless tobacco: Never   Tobacco comments:    Smokes 6 cigarettes daily   Vaping Use   Vaping status: Never Used  Substance and Sexual Activity    Alcohol use: No   Drug use: No   Sexual activity: Not on file  Other Topics Concern   Not on file  Social History Narrative   Not on file   Social Drivers of Health   Financial Resource Strain: Not on file  Food Insecurity: Patient Declined (09/29/2023)   Hunger Vital Sign    Worried About Running Out of Food in the Last Year: Patient declined    Ran Out of Food in the Last Year: Patient declined  Transportation Needs: Patient Declined (09/29/2023)   PRAPARE - Administrator, Civil Service (Medical): Patient declined    Lack of Transportation (Non-Medical): Patient declined  Physical Activity: Not on file  Stress: Not on file  Social Connections: Not on file  Intimate Partner Violence: Patient Declined (09/29/2023)   Humiliation, Afraid, Rape, and Kick questionnaire    Fear of Current or Ex-Partner: Patient declined    Emotionally Abused: Patient declined    Physically Abused: Patient declined    Sexually Abused: Patient declined    Family History  Problem Relation Age of Onset   Heart disease Father     No Known Allergies  Review of Systems  All other systems reviewed and are negative.      Objective:   BP (!) 158/78   Pulse 72   Ht 6' (1.829 m)   Wt 188 lb 9.6 oz (85.5 kg)   SpO2 98%   BMI 25.58 kg/m   Vitals:   12/10/23 1003  BP: (!) 158/78  Pulse: 72  Height: 6' (1.829 m)  Weight: 188 lb 9.6 oz (85.5 kg)  SpO2: 98%  BMI (Calculated): 25.57    Physical Exam Vitals and nursing note reviewed.  Constitutional:      Appearance: Normal appearance. He is normal weight.  HENT:     Head: Normocephalic and atraumatic.  Eyes:     Extraocular Movements: Extraocular movements intact.     Conjunctiva/sclera: Conjunctivae normal.     Pupils: Pupils are equal, round, and reactive to light.  Cardiovascular:     Rate and Rhythm: Normal rate and regular rhythm.     Pulses: Normal pulses.     Heart sounds: Normal heart sounds.  Pulmonary:      Effort: Pulmonary effort is normal.     Breath sounds: Normal breath sounds.  Musculoskeletal:        General: Normal range of motion.  Neurological:     General: No focal deficit present.     Mental Status: He is alert and oriented to person, place, and time. Mental status is at baseline.  Psychiatric:        Mood and Affect: Mood normal.        Behavior: Behavior normal.        Thought Content: Thought content normal.        Judgment:  Judgment normal.      Results for orders placed or performed in visit on 12/10/23  POCT Urinalysis Dipstick  Result Value Ref Range   Color, UA yellow    Clarity, UA clear    Glucose, UA Negative Negative   Bilirubin, UA neg    Ketones, UA neg    Spec Grav, UA 1.025 1.010 - 1.025   Blood, UA trace    pH, UA 5.5 5.0 - 8.0   Protein, UA Positive (A) Negative   Urobilinogen, UA 0.2 0.2 or 1.0 E.U./dL   Nitrite, UA neg    Leukocytes, UA Negative Negative   Appearance clear    Odor yes     Recent Results (from the past 2160 hours)  Glucose, capillary     Status: None   Collection Time: 09/23/23  1:01 PM  Result Value Ref Range   Glucose-Capillary 79 70 - 99 mg/dL    Comment: Glucose reference range applies only to samples taken after fasting for at least 8 hours.  Hemoglobin A1c per protocol     Status: Abnormal   Collection Time: 09/23/23  1:44 PM  Result Value Ref Range   Hgb A1c MFr Bld 6.7 (H) 4.8 - 5.6 %    Comment: (NOTE) Pre diabetes:          5.7%-6.4%  Diabetes:              >6.4%  Glycemic control for   <7.0% adults with diabetes    Mean Plasma Glucose 145.59 mg/dL    Comment: Performed at [facility], [address], [place], [place] [address]  Basic metabolic panel per protocol     Status: Abnormal   Collection Time: 09/23/23  1:44 PM  Result Value Ref Range   Sodium 140 135 - 145 mmol/L   Potassium 4.6 3.5 - 5.1 mmol/L   Chloride 108 98 - 111 mmol/L   CO2 23 22 - 32 mmol/L   Glucose, Bld 59 (L) 70 - 99 mg/dL     Comment: Glucose reference range applies only to samples taken after fasting for at least 8 hours.   BUN 28 (H) 8 - 23 mg/dL   Creatinine, Ser 6.04 (H) 0.61 - 1.24 mg/dL   Calcium 9.4 8.9 - 54.0 mg/dL   GFR, Estimated 50 (L) >60 mL/min    Comment: (NOTE) Calculated using the CKD-EPI Creatinine Equation (2021)    Anion gap 9 5 - 15    Comment: Performed at [facility], [address] [place], [place] [address]  CBC per protocol     Status: Abnormal   Collection Time: 09/23/23  1:44 PM  Result Value Ref Range   WBC 7.9 4.0 - 10.5 K/uL   RBC 3.38 (L) 4.22 - 5.81 MIL/uL   Hemoglobin 10.7 (L) 13.0 - 17.0 g/dL   HCT 14.7 (L) 82.9 - 56.2 %   MCV 99.7 80.0 - 100.0 fL   MCH 31.7 26.0 - 34.0 pg   MCHC 31.8 30.0 - 36.0 g/dL   RDW 13.0 86.5 - 78.4 %   Platelets 167 150 - 400 K/uL   nRBC 0.0 0.0 - 0.2 %    Comment: Performed at [facility], [address], [place], [place] [address]  Glucose, capillary     Status: Abnormal   Collection Time: 09/29/23  7:02 AM  Result Value Ref Range   Glucose-Capillary 116 (H) 70 - 99 mg/dL    Comment: Glucose reference  range applies only to samples taken after fasting for at least 8 hours.   Comment 1 Notify RN    Comment 2 Document in Chart   Glucose, capillary     Status: Abnormal   Collection Time: 09/29/23  9:32 AM  Result Value Ref Range   Glucose-Capillary 131 (H) 70 - 99 mg/dL    Comment: Glucose reference range applies only to samples taken after fasting for at least 8 hours.  Surgical pathology     Status: None   Collection Time: 09/29/23  9:43 AM  Result Value Ref Range   SURGICAL PATHOLOGY      SURGICAL PATHOLOGY CASE: [id number] PATIENT: Rainer Majerus Surgical Pathology Report     Clinical History: Loop ileostomy in place for fecal diversion (crm)     FINAL MICROSCOPIC DIAGNOSIS:  A. LOOP ILEOSTOMY:      Enterocutaneous tissue with reactive changes consistent with ostomy site  changes.      Negative for dysplasia or malignancy.   GROSS DESCRIPTION:  Received fresh is a 5.2 cm in length by 3.5 cm in diameter portion of bowel.  1 end is received with a staple line.  The opposite end is open, surrounded by a thin rim of tan wrinkled skin, consistent with an ostomy.  The external surface is minimally roughened with attached well lobulated yellow adipose.  The mucosa is tan and glistening without distinct lesions.  A representative section, including skin is submitted in 1 block. Renette Butters, 09/29/2023)    Final Diagnosis performed by Lance Coon, MD.   Electronically signed 09/30/2023 Technical component performed at [facility], [address], [place], [place] [address].  Professional component performed at [employer], [address], [place], [place] [address].  Immunohistochemistry Technical component (if applicable) was performed at [facility]. [address], [place], [place] [address].   IMMUNOHISTOCHEMISTRY DISCLAIMER (if applicable): Some of these immunohistochemical stains may have been developed and the performance characteristics determine by [facility]. Some may not have been cleared or approved by the U.S. Food and Drug Administration. The FDA has determined that such clearance or approval is not necessary. This test is used for clinical purposes. It should not be regarded as investigational or for research. This laboratory is certified under the Clinical Laboratory Improvement Amendments of 1988 (CLIA-88) as qualified to perform high complexity clinical laboratory testing.  The controls stained appropriately.   IHC stains  are performed on formalin fixed, paraffin embedded tissue using a 3,3"diaminobenzidine (DAB) chromogen and Leica Bond Autostainer System. The staining intensity of the nucleus is score manually and is reported as the percentage of tumor cell nuclei  demonstrating specific nuclear staining. The specimens are fixed in 10% Neutral Formalin for at least 6 hours and up to 72hrs. These tests are validated on decalcified tissue. Results should be interpreted with caution given the possibility of false negative results on decalcified specimens. Antibody Clones are as follows ER-clone 30F, PR-clone 16, Ki67- clone MM1. Some of these immunohistochemical stains may have been developed and the performance characteristics determined by [facility].   Glucose, capillary     Status: Abnormal   Collection Time: 09/29/23 10:14 AM  Result Value Ref Range   Glucose-Capillary 144 (H) 70 - 99 mg/dL    Comment: Glucose reference range applies only to samples taken after fasting for at least 8 hours.  Basic metabolic panel     Status: Abnormal   Collection Time: 09/30/23  4:39 AM  Result Value Ref Range   Sodium 134 (L) 135 - 145 mmol/L   Potassium 5.1 3.5 - 5.1 mmol/L   Chloride 100 98 - 111 mmol/L   CO2 24 22 - 32 mmol/L   Glucose, Bld 181 (H) 70 - 99 mg/dL    Comment: Glucose reference range applies only to samples taken after fasting for at least 8 hours.   BUN 27 (H) 8 - 23 mg/dL   Creatinine, Ser 1.30 (H) 0.61 - 1.24 mg/dL   Calcium 8.9 8.9 - 86.5 mg/dL   GFR, Estimated 55 (L) >60 mL/min    Comment: (NOTE) Calculated using the CKD-EPI Creatinine Equation (2021)    Anion gap 10 5 - 15    Comment: Performed at [facility], [address] [place], [place] [address]  CBC     Status: Abnormal   Collection Time: 09/30/23  4:39 AM  Result Value Ref Range   WBC 9.9 4.0 - 10.5 K/uL   RBC 3.09 (L) 4.22 - 5.81 MIL/uL   Hemoglobin 9.5 (L) 13.0 - 17.0 g/dL   HCT 62.9 (L) 52.8 - 41.3 %   MCV 98.1 80.0 - 100.0 fL   MCH 30.7 26.0 - 34.0 pg   MCHC 31.4 30.0 - 36.0 g/dL   RDW 24.4 01.0 - 27.2 %   Platelets 149 (L) 150 - 400 K/uL   nRBC 0.0 0.0 - 0.2 %    Comment: Performed at [facility], [address] [place], [place] [address]  Magnesium     Status: None   Collection Time: 09/30/23  4:39 AM  Result Value Ref Range   Magnesium 2.0 1.7 - 2.4 mg/dL    Comment: Performed at [facility], [address] [place], [place] [address]  CEA     Status: None   Collection Time: 10/26/23 10:36 AM  Result Value Ref Range   CEA 4.7 0.0 - 4.7 ng/mL    Comment: (NOTE)                             Nonsmokers          <3.9                             Smokers             <5.6 [employer] Electrochemiluminescence Immunoassay (ECLIA) Values obtained with different assay methods or kits cannot be used interchangeably.  Results cannot be interpreted as absolute evidence of the presence or absence of malignant disease. Performed At: [facility] [address] [place], [place] [id number] Jolene Schimke MD [id number]   CMP (Cancer Center only)     Status: Abnormal   Collection Time: 10/26/23 10:36 AM  Result Value Ref Range   Sodium 138 135 - 145 mmol/L   Potassium 4.4 3.5 - 5.1 mmol/L   Chloride 104 98 - 111 mmol/L   CO2 23 22 - 32 mmol/L   Glucose, Bld 144 (H) 70 - 99 mg/dL    Comment: Glucose reference range applies only to samples taken after fasting for at least 8 hours.   BUN 28 (H) 8 - 23 mg/dL   Creatinine 1.66 (H) 0.63 - 1.24 mg/dL   Calcium 9.2 8.9 - 01.6 mg/dL   Total Protein 6.4 (L) 6.5 - 8.1 g/dL   Albumin 3.6 3.5 -  5.0 g/dL   AST 20 15 - 41 U/L   ALT 10 0 - 44 U/L   Alkaline Phosphatase 64 38 - 126 U/L   Total Bilirubin 0.5 <1.2 mg/dL   GFR, Estimated 49 (L) >60 mL/min    Comment: (NOTE) Calculated using the CKD-EPI Creatinine Equation (2021)    Anion gap 11 5 - 15    Comment: Performed at [facility], [address] [place], [place] [address]  CBC with Differential/Platelet     Status: Abnormal   Collection Time: 10/26/23 10:36 AM  Result Value Ref Range   WBC 6.0 4.0 - 10.5 K/uL   RBC 2.90 (L) 4.22 - 5.81 MIL/uL    Hemoglobin 8.9 (L) 13.0 - 17.0 g/dL   HCT 60.4 (L) 54.0 - 98.1 %   MCV 95.5 80.0 - 100.0 fL   MCH 30.7 26.0 - 34.0 pg   MCHC 32.1 30.0 - 36.0 g/dL   RDW 19.1 47.8 - 29.5 %   Platelets 174 150 - 400 K/uL   nRBC 0.0 0.0 - 0.2 %   Neutrophils Relative % 80 %   Neutro Abs 4.8 1.7 - 7.7 K/uL   Lymphocytes Relative 7 %   Lymphs Abs 0.4 (L) 0.7 - 4.0 K/uL   Monocytes Relative 8 %   Monocytes Absolute 0.5 0.1 - 1.0 K/uL   Eosinophils Relative 4 %   Eosinophils Absolute 0.3 0.0 - 0.5 K/uL   Basophils Relative 1 %   Basophils Absolute 0.0 0.0 - 0.1 K/uL   Immature Granulocytes 0 %   Abs Immature Granulocytes 0.02 0.00 - 0.07 K/uL    Comment: Performed at [facility], [address] [place], [place] [address]  POCT Urinalysis Dipstick     Status: Abnormal   Collection Time: 12/10/23 10:40 AM  Result Value Ref Range   Color, UA yellow    Clarity, UA clear    Glucose, UA Negative Negative   Bilirubin, UA neg    Ketones, UA neg    Spec Grav, UA 1.025 1.010 - 1.025   Blood, UA trace    pH, UA 5.5 5.0 - 8.0   Protein, UA Positive (A) Negative   Urobilinogen, UA 0.2 0.2 or 1.0 E.U./dL   Nitrite, UA neg    Leukocytes, UA Negative Negative   Appearance clear    Odor yes        Assessment & Plan:   Problem List Items Addressed This Visit       Cardiovascular and Mediastinum   Hypertension associated with diabetes (HCC)   Blood pressure well controlled with current medications.  Continue current therapy.  Will reassess at follow up.        Relevant Orders   CMP14+EGFR   CBC with Diff     Endocrine   Hyperlipidemia associated with type 2 diabetes mellitus (HCC)   Checking labs today.  Continue current therapy for lipid control. Will modify as needed based on labwork results.        Relevant Orders   Lipid panel   CMP14+EGFR   CBC with Diff   Type 2 diabetes mellitus with diabetic polyneuropathy, without long-term current use of insulin (HCC) - Primary    Checking labs today. Will call pt. With results  Continue current diabetes POC, as patient has been well controlled on current regimen.  Will adjust meds if needed based on labs.        Relevant Orders   CMP14+EGFR   Hemoglobin A1c  CBC with Diff     Genitourinary   CKD stage 3b, GFR 30-44 ml/min (HCC)   Patient is seen by Nephrology, who manage this condition.  He is well controlled with current therapy.   Will defer to them for further changes to plan of care.       Relevant Orders   CMP14+EGFR   CBC with Diff   Other Visit Diagnoses       Vitamin D deficiency, unspecified       Checking labs today.  Will continue supplements as needed.   Relevant Orders   VITAMIN D 25 Hydroxy (Vit-D Deficiency, Fractures)   CMP14+EGFR   CBC with Diff     B12 deficiency due to diet       Checking labs today.  Will continue supplements as needed.   Relevant Orders   CMP14+EGFR   Vitamin B12   CBC with Diff     Other fatigue       Relevant Orders   CMP14+EGFR   CBC with Diff     Need for hepatitis C screening test       Test ordered in office today. Will call with results.   Relevant Orders   CMP14+EGFR   CBC with Diff   Hepatitis C Ab reflex to Quant PCR     Dysuria       UA in office today normal.  Will continue to monitor going forward. Recheck at follow-up   Relevant Orders   POCT Urinalysis Dipstick (Completed)     Abnormal blood chemistry       Getting CBC in office today. Will call patient with results   Relevant Orders   Iron, TIBC and Ferritin Panel       Return in about 3 months (around 03/09/2024) for AWV.   Total time spent: 30 minutes  Miki Kins, FNP  12/10/2023   This document may have been prepared by [facility] Voice Recognition software and as such may include unintentional dictation errors.

## 2023-12-10 NOTE — Assessment & Plan Note (Signed)
Patient is seen by Nephrology, who manage this condition.  He is well controlled with current therapy.   Will defer to them for further changes to plan of care.

## 2023-12-10 NOTE — Assessment & Plan Note (Signed)
Checking labs today.  Continue current therapy for lipid control. Will modify as needed based on labwork results.

## 2023-12-10 NOTE — Assessment & Plan Note (Signed)
Checking labs today. Will call pt. With results  Continue current diabetes POC, as patient has been well controlled on current regimen.  Will adjust meds if needed based on labs.

## 2023-12-11 ENCOUNTER — Encounter: Payer: Self-pay | Admitting: Oncology

## 2023-12-11 LAB — HEMOGLOBIN A1C
Est. average glucose Bld gHb Est-mCnc: 131 mg/dL
Hgb A1c MFr Bld: 6.2 % — ABNORMAL HIGH (ref 4.8–5.6)

## 2023-12-11 LAB — IRON,TIBC AND FERRITIN PANEL
Ferritin: 57 ng/mL (ref 30–400)
Iron Saturation: 31 % (ref 15–55)
Iron: 121 ug/dL (ref 38–169)
Total Iron Binding Capacity: 385 ug/dL (ref 250–450)
UIBC: 264 ug/dL (ref 111–343)

## 2023-12-11 LAB — CBC WITH DIFFERENTIAL/PLATELET
Basophils Absolute: 0 10*3/uL (ref 0.0–0.2)
Basos: 1 %
EOS (ABSOLUTE): 0.2 10*3/uL (ref 0.0–0.4)
Eos: 3 %
Hematocrit: 32.7 % — ABNORMAL LOW (ref 37.5–51.0)
Hemoglobin: 10.4 g/dL — ABNORMAL LOW (ref 13.0–17.7)
Immature Grans (Abs): 0 10*3/uL (ref 0.0–0.1)
Immature Granulocytes: 0 %
Lymphocytes Absolute: 0.4 10*3/uL — ABNORMAL LOW (ref 0.7–3.1)
Lymphs: 7 %
MCH: 30.1 pg (ref 26.6–33.0)
MCHC: 31.8 g/dL (ref 31.5–35.7)
MCV: 95 fL (ref 79–97)
Monocytes Absolute: 0.4 10*3/uL (ref 0.1–0.9)
Monocytes: 6 %
Neutrophils Absolute: 5.6 10*3/uL (ref 1.4–7.0)
Neutrophils: 83 %
Platelets: 169 10*3/uL (ref 150–450)
RBC: 3.45 x10E6/uL — ABNORMAL LOW (ref 4.14–5.80)
RDW: 14.2 % (ref 11.6–15.4)
WBC: 6.7 10*3/uL (ref 3.4–10.8)

## 2023-12-11 LAB — CMP14+EGFR
ALT: 14 [IU]/L (ref 0–44)
AST: 19 [IU]/L (ref 0–40)
Albumin: 4.3 g/dL (ref 3.8–4.8)
Alkaline Phosphatase: 93 [IU]/L (ref 44–121)
BUN/Creatinine Ratio: 24 (ref 10–24)
BUN: 33 mg/dL — ABNORMAL HIGH (ref 8–27)
Bilirubin Total: 0.4 mg/dL (ref 0.0–1.2)
CO2: 20 mmol/L (ref 20–29)
Calcium: 9.3 mg/dL (ref 8.6–10.2)
Chloride: 102 mmol/L (ref 96–106)
Creatinine, Ser: 1.39 mg/dL — ABNORMAL HIGH (ref 0.76–1.27)
Globulin, Total: 2.1 g/dL (ref 1.5–4.5)
Glucose: 125 mg/dL — ABNORMAL HIGH (ref 70–99)
Potassium: 4.5 mmol/L (ref 3.5–5.2)
Sodium: 143 mmol/L (ref 134–144)
Total Protein: 6.4 g/dL (ref 6.0–8.5)
eGFR: 53 mL/min/{1.73_m2} — ABNORMAL LOW (ref >59)

## 2023-12-11 LAB — LIPID PANEL
Chol/HDL Ratio: 2.4 {ratio} (ref 0.0–5.0)
Cholesterol, Total: 110 mg/dL (ref 100–199)
HDL: 46 mg/dL (ref >39)
LDL Chol Calc (NIH): 46 mg/dL (ref 0–99)
Triglycerides: 97 mg/dL (ref 0–149)
VLDL Cholesterol Cal: 18 mg/dL (ref 5–40)

## 2023-12-11 LAB — VITAMIN B12: Vitamin B-12: 1170 pg/mL (ref 232–1245)

## 2023-12-11 LAB — HCV AB W REFLEX TO QUANT PCR: HCV Ab: NONREACTIVE

## 2023-12-11 LAB — VITAMIN D 25 HYDROXY (VIT D DEFICIENCY, FRACTURES): Vit D, 25-Hydroxy: 50.1 ng/mL (ref 30.0–100.0)

## 2023-12-11 LAB — HCV INTERPRETATION

## 2024-01-11 ENCOUNTER — Other Ambulatory Visit: Payer: Self-pay | Admitting: Cardiovascular Disease

## 2024-01-20 ENCOUNTER — Ambulatory Visit
Admission: RE | Admit: 2024-01-20 | Discharge: 2024-01-20 | Disposition: A | Discharge status: Home / Self Care | Payer: Medicare HMO | Source: Ambulatory Visit | Attending: Oncology | Admitting: Oncology

## 2024-01-20 DIAGNOSIS — K4020 Bilateral inguinal hernia, without obstruction or gangrene, not specified as recurrent: Secondary | ICD-10-CM | POA: Diagnosis not present

## 2024-01-20 DIAGNOSIS — C20 Malignant neoplasm of rectum: Secondary | ICD-10-CM | POA: Insufficient documentation

## 2024-01-20 DIAGNOSIS — N200 Calculus of kidney: Secondary | ICD-10-CM | POA: Diagnosis not present

## 2024-01-20 MED ORDER — IOHEXOL 300 MG/ML  SOLN
100.0000 mL | Freq: Once | INTRAMUSCULAR | Status: AC | PRN
  Administered 2024-01-20: 100 mL via INTRAVENOUS

## 2024-01-26 ENCOUNTER — Other Ambulatory Visit: Payer: Self-pay | Admitting: Cardiovascular Disease

## 2024-01-28 ENCOUNTER — Other Ambulatory Visit: Payer: Self-pay | Admitting: *Deleted

## 2024-01-28 DIAGNOSIS — C20 Malignant neoplasm of rectum: Secondary | ICD-10-CM

## 2024-01-31 ENCOUNTER — Inpatient Hospital Stay: Payer: Medicare HMO | Admitting: Oncology

## 2024-01-31 ENCOUNTER — Inpatient Hospital Stay: Payer: Medicare HMO

## 2024-02-07 NOTE — Progress Notes (Unsigned)
 Cardiology Office Note  Date:  02/08/2024   ID:  [name] [name] 1946/06/03, MRN [id number]  PCP:  Miki Kins, FNP   Chief Complaint  Patient presents with   12 month follow up     Patient off Plavix x 1 year his oncologist. The patient is concerned if he she be back on the Plavix. Patient is having teeth extraction next Monday and was told not to be on any blood thinners.     HPI:  [name] [name] is a 78 year old gentleman with  coronary artery disease,  occluded left circumflex, severely diseased proximal RCA with stent placed December 2009, with no intervention performed on the circumflex secondary to significant collateral circulation,  who continues to smoke one pack per day,  hyperlipidemia,  episodes of chest pain who previously worked in trucking, now retired,  Rectal adenocarcinoma  who presents for routine follow up of his coronary artery disease.   Last seen by myself 3/24  Records reviewed from oncology Developed constipation CT showing short-segment area of narrowing within the distal sigmoid near the rectosigmoid junction.  Completed chemotherapy 8 cycles with XRT Had diverting surgery,  with colostomy bag 11/24: ileostomy takedown  initially having significant diarrhea  Had corrective surgery he reports  Recent lab work reviewed History of anemia HGB: 8.9 up to 10.4  CT 01/20/24: results pending  Off plavix, remains on aspirin, statin, isosorbide  Recent stressors at home, wife now retired, car trouble  Long history of smoking  Lab work reviewed Total chol 110, LDL 46 A1C 6.2  Denies significant chest pain concerning for angina  EKG personally reviewed by myself on todays visit EKG Interpretation Date/Time:  Tuesday February 08 2024 15:25:53 EDT Ventricular Rate:  63 PR Interval:  146 QRS Duration:  124 QT Interval:  406 QTC Calculation: 415 R Axis:   60  Text Interpretation: Normal sinus rhythm Non-specific intra-ventricular  conduction delay When compared with ECG of 08-May-2023 08:41, Non-specific intra-ventricular conduction delay has replaced Right bundle branch block Confirmed by Julien Nordmann [phone]) on 02/08/2024 3:41:16 PM   Father passed away 03/17/23lots of stress, age >95 bpm   PMH:   has a past medical history of Anemia, Anemia of chronic disease (06/09/2023), Anginal pain (HCC), Bradycardia, Cancer (HCC), CKD stage 3b, GFR 30-44 ml/min (HCC) (05/21/2023), Coronary artery disease, Depression, Diabetes mellitus without complication (HCC), GERD (gastroesophageal reflux disease), History of kidney stones, Hyperlipidemia, Hyperlipidemia associated with type 2 diabetes mellitus (HCC) (04/04/2010), Hypertension, Nephrolithiasis, Partial Large bowel obstruction (HCC), and Rectal adenocarcinoma (HCC) (08/07/2022).  PSH:    Past Surgical History:  Procedure Laterality Date   CARDIAC CATHETERIZATION     CATARACT EXTRACTION     COLONOSCOPY WITH PROPOFOL N/A 05/05/2023   Procedure: COLONOSCOPY WITH PROPOFOL;  Surgeon: Regis Bill, MD;  Location: [facility] ENDOSCOPY;  Service: Endoscopy;  Laterality: N/A;  Patient having surgery tomorrow, 05/06/2023. Ok'd per [place] and office   CORONARY ANGIOPLASTY     EYE SURGERY     FLEXIBLE SIGMOIDOSCOPY N/A 07/27/2022   Procedure: FLEXIBLE SIGMOIDOSCOPY;  Surgeon: Regis Bill, MD;  Location: [facility] ENDOSCOPY;  Service: Endoscopy;  Laterality: N/A;   ILEOSTOMY CLOSURE N/A 09/29/2023   Procedure: OPEN TAKEDOWN OF LOOP ILEOSTOMY, LEFT TAP BLOCK;  Surgeon: [name], MD;  Location: [facility];  Service: General;  Laterality: N/A;   KIDNEY STONE SURGERY     NECK SURGERY  12/18/2011   nephrolithiasis     PORTACATH PLACEMENT Right 08/24/2022  Procedure: INSERTION PORT-A-CATH;  Surgeon: Campbell Lerner, MD;  Location: [facility] ORS;  Service: General;  Laterality: Right;   RECTAL EXAM UNDER ANESTHESIA N/A 09/29/2023   Procedure: RECTAL EXAM UNDER ANESTHESIA;  Surgeon: [name], MD;  Location: [facility];  Service: General;  Laterality: N/A;   stent (other)     TRANSVERSE LOOP COLOSTOMY N/A 07/28/2022   Procedure: TRANSVERSE LOOP COLOSTOMY;  Surgeon: Campbell Lerner, MD;  Location: [facility] ORS;  Service: General;  Laterality: N/A;   XI ROBOTIC ASSISTED LOWER ANTERIOR RESECTION N/A 05/06/2023   Procedure: XI ROBOTIC ASSISTED LOWER ANTERIOR RECTOSIGMOID RESECTION, TAKEDOWN OF OLD LOOP COLOSTOMY WITH ANASTOMOSIS, MOBILIZATION OF SPLENIC FLEXURE OF COLON, AND NEW DIVERTING LOOP ILEOSTOMY, INTRAOPERATIVE ASSESSMENT OF TISSUE VASCULAR PERFUSION USING ICG (INDOCYANINE GREEN) IMMUNOFLUORESCENCE, FLEXIBLE SIGMOIDOSCOPY;  Surgeon: [name], MD;  Location: [facility];  Service: Cheron Every    Current Outpatient Medications  Medication Sig Dispense Refill   aspirin 81 MG EC tablet Take 81 mg by mouth daily.       atorvastatin (LIPITOR) 40 MG tablet TAKE 1 TABLET BY MOUTH EVERY DAY 90 tablet 3   bisacodyl (DULCOLAX) 5 MG EC tablet Take 5 mg by mouth daily as needed for moderate constipation.     Cholecalciferol (VITAMIN D-3 PO) Take 1 tablet by mouth daily.     Cyanocobalamin (VITAMIN B-12 PO) Take 1 tablet by mouth daily.     diphenoxylate-atropine (LOMOTIL) 2.5-0.025 MG tablet Take 1 tablet by mouth 2 (two) times daily. 30 tablet 5   famotidine (PEPCID) 40 MG tablet TAKE 1 TABLET BY MOUTH EVERY DAY 90 tablet 3   ferrous sulfate 325 (65 FE) MG tablet Take 1 tablet by mouth 2 (two) times daily with a meal.     glimepiride (AMARYL) 4 MG tablet Take 4 mg by mouth daily with breakfast.     isosorbide mononitrate (IMDUR) 30 MG 24 hr tablet Take 30 mg by mouth daily.     loperamide (IMODIUM) 2 MG capsule Take 2 mg by mouth as needed for diarrhea or loose stools.     metFORMIN (GLUCOPHAGE) 1000 MG tablet Take 1,000 mg by mouth daily.     nitroGLYCERIN (NITROSTAT) 0.4 MG SL tablet Place 1 tablet (0.4 mg total) under the tongue every 5 (five) minutes as needed for chest pain. 25  tablet 6   ondansetron (ZOFRAN) 8 MG tablet Take 8 mg by mouth every 8 (eight) hours as needed for nausea or vomiting.     pioglitazone (ACTOS) 15 MG tablet Take 15 mg by mouth daily.     psyllium (METAMUCIL) 58.6 % packet Take 1 packet by mouth daily.     traMADol (ULTRAM) 50 MG tablet Take 1-2 tablets (50-100 mg total) by mouth every 6 (six) hours as needed for moderate pain (pain score 4-6). 20 tablet 0   No current facility-administered medications for this visit.    Allergies:   Patient has no known allergies.   Social History:  The patient  reports that he has been smoking cigarettes. He has a 9 pack-year smoking history. He has never used smokeless tobacco. He reports that he does not drink alcohol and does not use drugs.   Family History:   Father had CAD, CABG age 63s  Review of Systems: Review of Systems  Constitutional: Negative.   HENT: Negative.    Respiratory: Negative.    Cardiovascular: Negative.   Gastrointestinal: Negative.   Musculoskeletal: Negative.   Neurological: Negative.   Psychiatric/Behavioral: Negative.  All other systems reviewed and are negative.   PHYSICAL EXAM: VS:  BP (!) 150/80 (BP Location: Left Arm, Patient Position: Sitting, Cuff Size: Normal)   Pulse 63   Ht 6' (1.829 m)   Wt 191 lb (86.6 kg)   SpO2 97%   BMI 25.90 kg/m  , BMI Body mass index is 25.9 kg/m.  Constitutional:  oriented to person, place, and time. No distress.  HENT:  Head: Grossly normal Eyes:  no discharge. No scleral icterus.  Neck: No JVD, no carotid bruits  Cardiovascular: Regular rate and rhythm, no murmurs appreciated Pulmonary/Chest: Clear to auscultation bilaterally, no wheezes or rails Abdominal: Soft.  no distension.  no tenderness.  Musculoskeletal: Normal range of motion Neurological:  normal muscle tone. Coordination normal. No atrophy Skin: Skin warm and dry Psychiatric: normal affect, pleasant  Recent Labs: 09/30/2023: Magnesium 2.0 12/10/2023: ALT  14; BUN 33; Creatinine, Ser 1.39; Hemoglobin 10.4; Platelets 169; Potassium 4.5; Sodium 143    Lipid Panel Lab Results  Component Value Date   CHOL 110 12/10/2023   HDL 46 12/10/2023   LDLCALC 46 12/10/2023   TRIG 97 12/10/2023    Wt Readings from Last 3 Encounters:  02/08/24 191 lb (86.6 kg)  12/10/23 188 lb 9.6 oz (85.5 kg)  10/26/23 175 lb (79.4 kg)     ASSESSMENT AND PLAN:   Atherosclerosis of native coronary artery without angina pectoris, unspecified whether native or transplanted heart -  Currently with no symptoms of angina. No further workup at this time. Continue current medication regimen.Smoking cessation recommended  TOBACCO ABUSE We have encouraged him to continue to work on weaning his cigarettes and smoking cessation. He will continue to work on this and does not want any assistance with chantix.    Uncontrolled type 2 diabetes mellitus with hyperosmolarity without coma, without long-term current use of insulin (HCC) A1c running lower 6.2 with weight loss  Mixed hyperlipidemia Cholesterol is at goal on the current lipid regimen. No changes to the medications were made.  Essential hypertension Blood pressure is well controlled on today's visit. No changes made to the medications.  Adjustment disorder Diagnosed with cancer, completed chemo and radiation Wife now retired, stressors at home, car trouble  Rectal adenocarcinoma Completed chemo, radiation, surgeries Followed by oncology, repeat CT results pending    Orders Placed This Encounter  Procedures   EKG 12-Lead      Signed, Dossie Arbour, M.D., Ph.D. 02/08/2024  [facility] [place], [place] [phone]

## 2024-02-08 ENCOUNTER — Encounter: Payer: Self-pay | Admitting: Cardiovascular Disease

## 2024-02-08 ENCOUNTER — Ambulatory Visit: Payer: Medicare HMO | Attending: Cardiovascular Disease | Admitting: Cardiovascular Disease

## 2024-02-08 VITALS — BP 150/80 | HR 63 | Ht 72.0 in | Wt 191.0 lb

## 2024-02-08 DIAGNOSIS — I10 Essential (primary) hypertension: Secondary | ICD-10-CM

## 2024-02-08 DIAGNOSIS — I25118 Atherosclerotic heart disease of native coronary artery with other forms of angina pectoris: Secondary | ICD-10-CM

## 2024-02-08 DIAGNOSIS — E785 Hyperlipidemia, unspecified: Secondary | ICD-10-CM

## 2024-02-08 DIAGNOSIS — E1142 Type 2 diabetes mellitus with diabetic polyneuropathy: Secondary | ICD-10-CM | POA: Diagnosis not present

## 2024-02-08 DIAGNOSIS — I152 Hypertension secondary to endocrine disorders: Secondary | ICD-10-CM | POA: Diagnosis not present

## 2024-02-08 DIAGNOSIS — E782 Mixed hyperlipidemia: Secondary | ICD-10-CM | POA: Diagnosis not present

## 2024-02-08 DIAGNOSIS — E1100 Type 2 diabetes mellitus with hyperosmolarity without nonketotic hyperglycemic-hyperosmolar coma (NKHHC): Secondary | ICD-10-CM | POA: Diagnosis not present

## 2024-02-08 DIAGNOSIS — E1159 Type 2 diabetes mellitus with other circulatory complications: Secondary | ICD-10-CM

## 2024-02-08 DIAGNOSIS — F17210 Nicotine dependence, cigarettes, uncomplicated: Secondary | ICD-10-CM | POA: Diagnosis not present

## 2024-02-08 DIAGNOSIS — E1169 Type 2 diabetes mellitus with other specified complication: Secondary | ICD-10-CM | POA: Diagnosis not present

## 2024-02-08 DIAGNOSIS — F17200 Nicotine dependence, unspecified, uncomplicated: Secondary | ICD-10-CM

## 2024-02-08 DIAGNOSIS — Z720 Tobacco use: Secondary | ICD-10-CM

## 2024-02-08 NOTE — Patient Instructions (Addendum)
 Monitor blood pressure at home Call if numbers run high   Medication Instructions:  No changes  If you need a refill on your cardiac medications before your next appointment, please call your pharmacy.   Lab work: No new labs needed  Testing/Procedures: No new testing needed  Follow-Up: At [facility], you and your health needs are our priority.  As part of our continuing mission to provide you with exceptional heart care, we have created designated Provider Care Teams.  These Care Teams include your primary Cardiologist (physician) and Advanced Practice Providers (APPs -  Physician Assistants and Nurse Practitioners) who all work together to provide you with the care you need, when you need it.  You will need a follow up appointment in 12 months  Providers on your designated Care Team:   Nicolasa Ducking, NP Eula Listen, PA-C Cadence Fransico Michael, [place]  COVID-19 Vaccine Information can be found at: [email or web address] For questions related to vaccine distribution or appointments, please email [email or web address] or call [phone].

## 2024-02-10 ENCOUNTER — Telehealth: Payer: Self-pay

## 2024-02-16 ENCOUNTER — Inpatient Hospital Stay (HOSPITAL_BASED_OUTPATIENT_CLINIC_OR_DEPARTMENT_OTHER): Admitting: Oncology

## 2024-02-16 ENCOUNTER — Inpatient Hospital Stay

## 2024-02-16 ENCOUNTER — Inpatient Hospital Stay: Attending: Oncology

## 2024-02-16 ENCOUNTER — Encounter: Payer: Self-pay | Admitting: Oncology

## 2024-02-16 VITALS — BP 180/85 | HR 88 | Temp 97.0°F | Resp 16 | Wt 191.0 lb

## 2024-02-16 DIAGNOSIS — E1122 Type 2 diabetes mellitus with diabetic chronic kidney disease: Secondary | ICD-10-CM | POA: Diagnosis not present

## 2024-02-16 DIAGNOSIS — Z95828 Presence of other vascular implants and grafts: Secondary | ICD-10-CM

## 2024-02-16 DIAGNOSIS — K4020 Bilateral inguinal hernia, without obstruction or gangrene, not specified as recurrent: Secondary | ICD-10-CM | POA: Insufficient documentation

## 2024-02-16 DIAGNOSIS — E785 Hyperlipidemia, unspecified: Secondary | ICD-10-CM | POA: Diagnosis not present

## 2024-02-16 DIAGNOSIS — N200 Calculus of kidney: Secondary | ICD-10-CM | POA: Insufficient documentation

## 2024-02-16 DIAGNOSIS — I700 Atherosclerosis of aorta: Secondary | ICD-10-CM | POA: Diagnosis not present

## 2024-02-16 DIAGNOSIS — D509 Iron deficiency anemia, unspecified: Secondary | ICD-10-CM | POA: Insufficient documentation

## 2024-02-16 DIAGNOSIS — Z7984 Long term (current) use of oral hypoglycemic drugs: Secondary | ICD-10-CM | POA: Diagnosis not present

## 2024-02-16 DIAGNOSIS — K219 Gastro-esophageal reflux disease without esophagitis: Secondary | ICD-10-CM | POA: Diagnosis not present

## 2024-02-16 DIAGNOSIS — I2510 Atherosclerotic heart disease of native coronary artery without angina pectoris: Secondary | ICD-10-CM | POA: Insufficient documentation

## 2024-02-16 DIAGNOSIS — Z7982 Long term (current) use of aspirin: Secondary | ICD-10-CM | POA: Diagnosis not present

## 2024-02-16 DIAGNOSIS — Z79899 Other long term (current) drug therapy: Secondary | ICD-10-CM | POA: Insufficient documentation

## 2024-02-16 DIAGNOSIS — Z452 Encounter for adjustment and management of vascular access device: Secondary | ICD-10-CM | POA: Insufficient documentation

## 2024-02-16 DIAGNOSIS — D696 Thrombocytopenia, unspecified: Secondary | ICD-10-CM | POA: Insufficient documentation

## 2024-02-16 DIAGNOSIS — N1832 Chronic kidney disease, stage 3b: Secondary | ICD-10-CM | POA: Insufficient documentation

## 2024-02-16 DIAGNOSIS — Z9221 Personal history of antineoplastic chemotherapy: Secondary | ICD-10-CM | POA: Insufficient documentation

## 2024-02-16 DIAGNOSIS — F17210 Nicotine dependence, cigarettes, uncomplicated: Secondary | ICD-10-CM | POA: Insufficient documentation

## 2024-02-16 DIAGNOSIS — Z87442 Personal history of urinary calculi: Secondary | ICD-10-CM | POA: Diagnosis not present

## 2024-02-16 DIAGNOSIS — C20 Malignant neoplasm of rectum: Secondary | ICD-10-CM

## 2024-02-16 DIAGNOSIS — I129 Hypertensive chronic kidney disease with stage 1 through stage 4 chronic kidney disease, or unspecified chronic kidney disease: Secondary | ICD-10-CM | POA: Insufficient documentation

## 2024-02-16 LAB — CMP (CANCER CENTER ONLY)
ALT: 19 U/L (ref 0–44)
AST: 19 U/L (ref 15–41)
Albumin: 3.9 g/dL (ref 3.5–5.0)
Alkaline Phosphatase: 71 U/L (ref 38–126)
Anion gap: 8 (ref 5–15)
BUN: 29 mg/dL — ABNORMAL HIGH (ref 8–23)
CO2: 24 mmol/L (ref 22–32)
Calcium: 8.8 mg/dL — ABNORMAL LOW (ref 8.9–10.3)
Chloride: 104 mmol/L (ref 98–111)
Creatinine: 1.47 mg/dL — ABNORMAL HIGH (ref 0.61–1.24)
GFR, Estimated: 49 mL/min — ABNORMAL LOW (ref >60)
Glucose, Bld: 127 mg/dL — ABNORMAL HIGH (ref 70–99)
Potassium: 3.7 mmol/L (ref 3.5–5.1)
Sodium: 136 mmol/L (ref 135–145)
Total Bilirubin: 0.4 mg/dL (ref 0.0–1.2)
Total Protein: 6.7 g/dL (ref 6.5–8.1)

## 2024-02-16 LAB — CBC WITH DIFFERENTIAL/PLATELET
Abs Immature Granulocytes: 0.02 10*3/uL (ref 0.00–0.07)
Basophils Absolute: 0 10*3/uL (ref 0.0–0.1)
Basophils Relative: 0 %
Eosinophils Absolute: 0.2 10*3/uL (ref 0.0–0.5)
Eosinophils Relative: 3 %
HCT: 35.1 % — ABNORMAL LOW (ref 39.0–52.0)
Hemoglobin: 11.2 g/dL — ABNORMAL LOW (ref 13.0–17.0)
Immature Granulocytes: 0 %
Lymphocytes Relative: 9 %
Lymphs Abs: 0.5 10*3/uL — ABNORMAL LOW (ref 0.7–4.0)
MCH: 30.3 pg (ref 26.0–34.0)
MCHC: 31.9 g/dL (ref 30.0–36.0)
MCV: 94.9 fL (ref 80.0–100.0)
Monocytes Absolute: 0.5 10*3/uL (ref 0.1–1.0)
Monocytes Relative: 9 %
Neutro Abs: 4.4 10*3/uL (ref 1.7–7.7)
Neutrophils Relative %: 79 %
Platelets: 144 10*3/uL — ABNORMAL LOW (ref 150–400)
RBC: 3.7 MIL/uL — ABNORMAL LOW (ref 4.22–5.81)
RDW: 14.5 % (ref 11.5–15.5)
WBC: 5.6 10*3/uL (ref 4.0–10.5)
nRBC: 0 % (ref 0.0–0.2)

## 2024-02-16 MED ORDER — HEPARIN SOD (PORK) LOCK FLUSH 100 UNIT/ML IV SOLN
500.0000 [IU] | Freq: Once | INTRAVENOUS | Status: AC
  Administered 2024-02-16: 500 [IU] via INTRAVENOUS
  Filled 2024-02-16: qty 5

## 2024-02-16 MED ORDER — SODIUM CHLORIDE 0.9% FLUSH
10.0000 mL | Freq: Once | INTRAVENOUS | Status: AC
  Administered 2024-02-16: 10 mL via INTRAVENOUS
  Filled 2024-02-16: qty 10

## 2024-02-16 NOTE — Progress Notes (Unsigned)
 Patient had a CT scan on 01/20/2024, he has had all of his teeth taken out, which he thinks was the cause behind the headaches.

## 2024-02-16 NOTE — Progress Notes (Unsigned)
 [facility]  [phone] [phone]  ID: [name] [name] OB: [birth year]  MR#: [id number]  AOZ#:[id number]  Patient Care Team: Miki Kins, FNP as PCP - General (Family Medicine) Mariah Milling Tollie Pizza, MD as PCP - Cardiology (Cardiology) Antonieta Iba, MD as Consulting Physician (Cardiology) Benita Gutter, RN as Oncology Nurse Navigator Orlie Dakin, Tollie Pizza, MD as Consulting Physician (Oncology) [name], MD as Consulting Physician (Colon and Rectal Surgery)  CHIEF COMPLAINT: Pathologic stage IIa rectal adenocarcinoma.  INTERVAL HISTORY: Patient returns to clinic today for repeat laboratory work, further evaluation, and discussion of his imaging results.  He recently underwent his ileostomy takedown approximately 3 weeks ago.  He was initially having significant diarrhea, but this has improved.  He continues to have mild surgical site tenderness, but this is improved as well.  He has no neurologic complaints.  He denies any recent fevers or illnesses.  He has no chest pain, shortness of breath, cough, or hemoptysis.  He denies any nausea, vomiting, or constipation.  He does not report any melena or hematochezia.  He has no urinary complaints.  Patient offers no further specific complaints today.  REVIEW OF SYSTEMS:   Review of Systems  Constitutional:  Positive for malaise/fatigue. Negative for fever and weight loss.  Respiratory: Negative.  Negative for cough, hemoptysis and shortness of breath.   Cardiovascular: Negative.  Negative for chest pain and leg swelling.  Gastrointestinal:  Positive for diarrhea. Negative for abdominal pain, blood in stool and melena.  Genitourinary: Negative.  Negative for dysuria.  Musculoskeletal: Negative.  Negative for back pain.  Skin: Negative.  Negative for rash.  Neurological:  Positive for weakness. Negative for dizziness, focal weakness and headaches.  Psychiatric/Behavioral: Negative.  The  patient is not nervous/anxious.     As per HPI. Otherwise, a complete review of systems is negative.  PAST MEDICAL HISTORY: Past Medical History:  Diagnosis Date  . Anemia   . Anemia of chronic disease 06/09/2023  . Anginal pain (HCC)   . Bradycardia   . Cancer (HCC)   . CKD stage 3b, GFR 30-44 ml/min (HCC) 05/21/2023  . Coronary artery disease    x 1 stent  . Depression   . Diabetes mellitus without complication (HCC)   . GERD (gastroesophageal reflux disease)   . History of kidney stones   . Hyperlipidemia   . Hyperlipidemia associated with type 2 diabetes mellitus (HCC) 04/04/2010   Qualifier: Diagnosis of  By: [name] CMA, Carol    . Hypertension   . Nephrolithiasis   . Partial Large bowel obstruction (HCC)   . Rectal adenocarcinoma (HCC) 08/07/2022    PAST SURGICAL HISTORY: Past Surgical History:  Procedure Laterality Date  . CARDIAC CATHETERIZATION    . CATARACT EXTRACTION    . COLONOSCOPY WITH PROPOFOL N/A 05/05/2023   Procedure: COLONOSCOPY WITH PROPOFOL;  Surgeon: Regis Bill, MD;  Location: [facility] ENDOSCOPY;  Service: Endoscopy;  Laterality: N/A;  Patient having surgery tomorrow, 05/06/2023. Ok'd per [place] and office  . CORONARY ANGIOPLASTY    . EYE SURGERY    . FLEXIBLE SIGMOIDOSCOPY N/A 07/27/2022   Procedure: FLEXIBLE SIGMOIDOSCOPY;  Surgeon: Regis Bill, MD;  Location: [facility] ENDOSCOPY;  Service: Endoscopy;  Laterality: N/A;  . ILEOSTOMY CLOSURE N/A 09/29/2023   Procedure: OPEN TAKEDOWN OF LOOP ILEOSTOMY, LEFT TAP BLOCK;  Surgeon: [name], MD;  Location: [facility];  Service: General;  Laterality: N/A;  . KIDNEY STONE SURGERY    . NECK SURGERY  12/18/2011  . nephrolithiasis    . PORTACATH PLACEMENT Right 08/24/2022   Procedure: INSERTION PORT-A-CATH;  Surgeon: Campbell Lerner, MD;  Location: [facility] ORS;  Service: General;  Laterality: Right;  . RECTAL EXAM UNDER ANESTHESIA N/A 09/29/2023   Procedure: RECTAL EXAM UNDER ANESTHESIA;  Surgeon:  [name], MD;  Location: [facility];  Service: General;  Laterality: N/A;  . stent (other)    . TRANSVERSE LOOP COLOSTOMY N/A 07/28/2022   Procedure: TRANSVERSE LOOP COLOSTOMY;  Surgeon: Campbell Lerner, MD;  Location: [facility] ORS;  Service: General;  Laterality: N/A;  . XI ROBOTIC ASSISTED LOWER ANTERIOR RESECTION N/A 05/06/2023   Procedure: XI ROBOTIC ASSISTED LOWER ANTERIOR RECTOSIGMOID RESECTION, TAKEDOWN OF OLD LOOP COLOSTOMY WITH ANASTOMOSIS, MOBILIZATION OF SPLENIC FLEXURE OF COLON, AND NEW DIVERTING LOOP ILEOSTOMY, INTRAOPERATIVE ASSESSMENT OF TISSUE VASCULAR PERFUSION USING ICG (INDOCYANINE GREEN) IMMUNOFLUORESCENCE, FLEXIBLE SIGMOIDOSCOPY;  Surgeon: [name], MD;  Location: [facility];  Service: General;  Lateralit    FAMILY HISTORY: Family History  Problem Relation Age of Onset  . Heart disease Father     ADVANCED DIRECTIVES (Y/N):  N  HEALTH MAINTENANCE: Social History   Tobacco Use  . Smoking status: Some Days    Current packs/day: 0.25    Average packs/day: 0.3 packs/day for 36.0 years (9.0 ttl pk-yrs)    Types: Cigarettes  . Smokeless tobacco: Never  . Tobacco comments:    Smokes 6 cigarettes daily   Vaping Use  . Vaping status: Never Used  Substance Use Topics  . Alcohol use: No  . Drug use: No     Colonoscopy:  PAP:  Bone density:  Lipid panel:  No Known Allergies  Current Outpatient Medications  Medication Sig Dispense Refill  . aspirin 81 MG EC tablet Take 81 mg by mouth daily.      Marland Kitchen atorvastatin (LIPITOR) 40 MG tablet TAKE 1 TABLET BY MOUTH EVERY DAY 90 tablet 3  . bisacodyl (DULCOLAX) 5 MG EC tablet Take 5 mg by mouth daily as needed for moderate constipation.    . Cholecalciferol (VITAMIN D-3 PO) Take 1 tablet by mouth daily.    . Cyanocobalamin (VITAMIN B-12 PO) Take 1 tablet by mouth daily.    . diphenoxylate-atropine (LOMOTIL) 2.5-0.025 MG tablet Take 1 tablet by mouth 2 (two) times daily. 30 tablet 5  . famotidine (PEPCID) 40 MG tablet TAKE 1  TABLET BY MOUTH EVERY DAY 90 tablet 3  . ferrous sulfate 325 (65 FE) MG tablet Take 1 tablet by mouth 2 (two) times daily with a meal.    . glimepiride (AMARYL) 4 MG tablet Take 4 mg by mouth daily with breakfast.    . isosorbide mononitrate (IMDUR) 30 MG 24 hr tablet Take 30 mg by mouth daily.    Marland Kitchen loperamide (IMODIUM) 2 MG capsule Take 2 mg by mouth as needed for diarrhea or loose stools.    . metFORMIN (GLUCOPHAGE) 1000 MG tablet Take 1,000 mg by mouth daily.    . nitroGLYCERIN (NITROSTAT) 0.4 MG SL tablet Place 1 tablet (0.4 mg total) under the tongue every 5 (five) minutes as needed for chest pain. 25 tablet 6  . ondansetron (ZOFRAN) 8 MG tablet Take 8 mg by mouth every 8 (eight) hours as needed for nausea or vomiting.    . pioglitazone (ACTOS) 15 MG tablet Take 15 mg by mouth daily.    . psyllium (METAMUCIL) 58.6 % packet Take 1 packet by mouth daily.    . traMADol (ULTRAM) 50 MG tablet Take 1-2  tablets (50-100 mg total) by mouth every 6 (six) hours as needed for moderate pain (pain score 4-6). 20 tablet 0   No current facility-administered medications for this visit.    OBJECTIVE: Vitals:   02/16/24 1307  BP: (!) 180/85  Pulse: 88  Resp: 16  Temp: (!) 97 F (36.1 C)  SpO2: 100%      Body mass index is 25.9 kg/m.    ECOG FS:0 - Asymptomatic  General: Well-developed, well-nourished, no acute distress. Eyes: Pink conjunctiva, anicteric sclera. HEENT: Normocephalic, moist mucous membranes. Lungs: No audible wheezing or coughing. Heart: Regular rate and rhythm. Abdomen: Soft, nontender, no obvious distention. Musculoskeletal: No edema, cyanosis, or clubbing. Neuro: Alert, answering all questions appropriately. Cranial nerves grossly intact. Skin: No rashes or petechiae noted. Psych: Normal affect.  LAB RESULTS:  Lab Results  Component Value Date   NA 143 12/10/2023   K 4.5 12/10/2023   CL 102 12/10/2023   CO2 20 12/10/2023   GLUCOSE 125 (H) 12/10/2023   BUN 33 (H)  12/10/2023   CREATININE 1.39 (H) 12/10/2023   CALCIUM 9.3 12/10/2023   PROT 6.4 12/10/2023   ALBUMIN 4.3 12/10/2023   AST 19 12/10/2023   ALT 14 12/10/2023   ALKPHOS 93 12/10/2023   BILITOT 0.4 12/10/2023   GFRNONAA 49 (L) 10/26/2023   GFRAA >60 12/20/2014    Lab Results  Component Value Date   WBC 6.7 12/10/2023   NEUTROABS 5.6 12/10/2023   HGB 10.4 (L) 12/10/2023   HCT 32.7 (L) 12/10/2023   MCV 95 12/10/2023   PLT 169 12/10/2023     STUDIES: CT ABDOMEN PELVIS W CONTRAST Result Date: 02/12/2024 CLINICAL DATA:  Follow-up rectal adenocarcinoma EXAM: CT ABDOMEN AND PELVIS WITH CONTRAST TECHNIQUE: Multidetector CT imaging of the abdomen and pelvis was performed using the standard protocol following bolus administration of intravenous contrast. RADIATION DOSE REDUCTION: This exam was performed according to the departmental dose-optimization program which includes automated exposure control, adjustment of the mA and/or kV according to patient size and/or use of iterative reconstruction technique. CONTRAST:  OMNIPAQUE IOHEXOL 300 MG/ML  SOLN COMPARISON:  10/18/2023 FINDINGS: Lower chest: Emphysematous changes with subpleural scarring/atelectasis in the bilateral lower lobes. Hepatobiliary: Liver is within normal limits. No suspicious/enhancing hepatic lesions. Gallbladder is unremarkable. No intrahepatic or extrahepatic ductal dilatation. Pancreas: Within normal limits. Spleen: Within normal limits. Adrenals/Urinary Tract: Adrenal glands are within normal limits. Bilateral renal sinus cysts, benign.  No follow-up is recommended. 3 mm nonobstructing posterior interpolar right renal calculus (series 2/image 45). Kidneys are otherwise within normal limits. No hydronephrosis. Bladder is within normal limits. Stomach/Bowel: Stomach is within normal limits. No evidence of bowel obstruction. Stable small bowel suture line in the left mid abdomen (series 2/image 65). Normal appendix (series 2/image  54). Status post low anterior resection with suture line in the lower pelvis (series 2/image 81). Vascular/Lymphatic: No evidence of abdominal aortic aneurysm. Atherosclerotic calcifications of the abdominal aorta and branch vessels, although vessels remain patent. No suspicious abdominopelvic lymphadenopathy. Reproductive: Prostate is unremarkable. Other: No abdominopelvic ascites. Stable mild presacral stranding (series 2/image 87), likely reflecting post treatment changes. Small fat containing bilateral inguinal hernias (series 2/image 88). Musculoskeletal: Degenerative changes of the visualized thoracolumbar spine. IMPRESSION: Status post low anterior resection. No evidence of recurrent or metastatic disease. Additional stable ancillary findings as above. Electronically Signed   By: Charline Bills M.D.   On: 02/12/2024 22:52    ASSESSMENT: Pathologic stage IIa rectal adenocarcinoma.  PLAN:  Pathologic stage IIa rectal adenocarcinoma: Diagnosis confirmed by imaging and biopsy.  MRI and CT scan results reviewed independently confirming stage of disease.  Patient completed neoadjuvant FOLFOX on December 30, 2022 and neoadjuvant Xeloda/XRT on February 17, 2023.  He underwent surgical resection on May 06, 2023 and was noted to have residual disease.  By report he had clear margins with 0 of 10 lymph nodes positive for malignancy.  He underwent ileostomy takedown on September 29, 2023.  CT scan results from October 24, 2023 reviewed independently and reported as above with no obvious evidence of recurrent or progressive disease.  Return to clinic in 3 months with repeat laboratory work, imaging, and further evaluation.  If imaging remains stable, likely can be transition to every 6 months.   Anemia: Hemoglobin trended down to 8.9 postoperatively, but patient noted to have a large hematoma on imaging.  Monitor. Hyperglycemia: Patient has improved blood glucose control.  Continue monitoring and treatment per  primary care. Renal insufficiency: Creatinine slightly worse today at 1.47.  Patient states his p.o. intake is improving.  Encouraged increased fluid intake as well.   Patient expressed understanding and was in agreement with this plan. He also understands that He can call clinic at any time with any questions, concerns, or complaints.    Cancer Staging  Rectal adenocarcinoma [facility]) Staging form: Colon and Rectum, AJCC 8th Edition - Clinical stage from 08/13/2022: Stage IIA (cT3, cN0, cM0) - Signed by Jeralyn Ruths, MD on 08/13/2022 Total positive nodes: 0 - Pathologic stage from 05/24/2023: Stage IIA (ypT3, pN0, cM0) - Signed by Jeralyn Ruths, MD on 05/24/2023 Stage prefix: Post-therapy Total positive nodes: 0  Jeralyn Ruths, MD   02/16/2024 1:24 PM

## 2024-02-17 ENCOUNTER — Other Ambulatory Visit: Payer: Self-pay | Admitting: *Deleted

## 2024-02-17 ENCOUNTER — Other Ambulatory Visit: Payer: Self-pay

## 2024-02-17 ENCOUNTER — Encounter: Payer: Self-pay | Admitting: Oncology

## 2024-02-17 ENCOUNTER — Telehealth: Payer: Self-pay | Admitting: *Deleted

## 2024-02-17 DIAGNOSIS — C20 Malignant neoplasm of rectum: Secondary | ICD-10-CM

## 2024-02-17 LAB — CEA: CEA: 7.6 ng/mL — ABNORMAL HIGH (ref 0.0–4.7)

## 2024-02-17 NOTE — Telephone Encounter (Signed)
 Call placed to patient and spouse regarding need for add on lab visit in 1 month to recheck CEA. Appointment scheduled by RN on the phone for 5/5 at 1:00 pm. Patients wife verbalized understanding and is in agreement with plan.

## 2024-03-02 ENCOUNTER — Encounter: Payer: Self-pay | Admitting: Radiation Oncology

## 2024-03-02 ENCOUNTER — Ambulatory Visit
Admission: RE | Admit: 2024-03-02 | Discharge: 2024-03-02 | Disposition: A | Discharge status: Home / Self Care | Payer: Medicare HMO | Source: Ambulatory Visit | Attending: Radiation Oncology | Admitting: Radiation Oncology

## 2024-03-02 VITALS — BP 175/91 | HR 70 | Temp 97.6°F | Resp 14 | Ht 72.0 in | Wt 188.6 lb

## 2024-03-02 DIAGNOSIS — Z9221 Personal history of antineoplastic chemotherapy: Secondary | ICD-10-CM | POA: Diagnosis not present

## 2024-03-02 DIAGNOSIS — Z85048 Personal history of other malignant neoplasm of rectum, rectosigmoid junction, and anus: Secondary | ICD-10-CM | POA: Insufficient documentation

## 2024-03-02 DIAGNOSIS — Z923 Personal history of irradiation: Secondary | ICD-10-CM | POA: Insufficient documentation

## 2024-03-02 DIAGNOSIS — C20 Malignant neoplasm of rectum: Secondary | ICD-10-CM

## 2024-03-02 DIAGNOSIS — R197 Diarrhea, unspecified: Secondary | ICD-10-CM | POA: Insufficient documentation

## 2024-03-02 NOTE — Progress Notes (Signed)
 Radiation Oncology Follow up Note  Name: [name] [name]   Date:   03/02/2024 MRN:  [id number] DOB: 1946-03-17    This 78 y.o. male presents to the clinic today for 1 year follow-up status post concurrent chemoradiation therapy for stage IIa (pT3a N0 M0) adenocarcinoma the rectum.  REFERRING PROVIDER: Trenda Frisk, FNP  HPI: Patient is a 78 year old male now out 1 year having completed concurrent chemoradiation therapy as well as FOLFOX chemotherapy for stage IIa (T3a N0 M0) adenocarcinoma the rectum.  He had undergone surgery showing a 1 cm residual disease tumor was grade 2 moderately differentiated no lymph nodes involved..  All surgical margins were negative.  He is had his ileostomy reversed.  Still tends towards diarrhea although he is taking Lomotil.  He otherwise specifically denies any increased lower urinary tract symptoms abdominal pain or discomfort blood per rectum.  He had a CT scan last month which I have reviewed shows status post low anterior resection with no evidence of recurrent or metastatic disease.  His CEA most recently was 7.6 up a little bit from 4 months ago although fairly stable from 1 year prior.  COMPLICATIONS OF TREATMENT: none  FOLLOW UP COMPLIANCE: keeps appointments   PHYSICAL EXAM:  BP (!) 175/91 Comment: Appointment made to see PCP  Pulse 70   Temp 97.6 F (36.4 C) (Tympanic)   Resp 14   Ht 6' (1.829 m)   Wt 188 lb 9.6 oz (85.5 kg)   BMI 25.58 kg/m  Well-developed well-nourished patient in NAD. HEENT reveals PERLA, EOMI, discs not visualized.  Oral cavity is clear. No oral mucosal lesions are identified. Neck is clear without evidence of cervical or supraclavicular adenopathy. Lungs are clear to A&P. Cardiac examination is essentially unremarkable with regular rate and rhythm without murmur rub or thrill. Abdomen is benign with no organomegaly or masses noted. Motor sensory and DTR levels are equal and symmetric in the upper and lower  extremities. Cranial nerves II through XII are grossly intact. Proprioception is intact. No peripheral adenopathy or edema is identified. No motor or sensory levels are noted. Crude visual fields are within normal range.  RADIOLOGY RESULTS: CT scan reviewed compatible with above-stated findings  PLAN: The present time patient is doing well with no evidence of disease.  He continues close monitoring by medical oncology.  I will turn follow-up care over to them.  Be happy to reevaluate the patient in time the future should that be indicated.  Patient knows to call with any concerns.  I would like to take this opportunity to thank you for allowing me to participate in the care of your patient.Glenis Langdon, MD

## 2024-03-09 ENCOUNTER — Ambulatory Visit: Payer: Medicare HMO | Admitting: Family

## 2024-03-09 DIAGNOSIS — Z85048 Personal history of other malignant neoplasm of rectum, rectosigmoid junction, and anus: Secondary | ICD-10-CM | POA: Diagnosis not present

## 2024-03-09 DIAGNOSIS — Z08 Encounter for follow-up examination after completed treatment for malignant neoplasm: Secondary | ICD-10-CM | POA: Diagnosis not present

## 2024-03-10 ENCOUNTER — Ambulatory Visit (INDEPENDENT_AMBULATORY_CARE_PROVIDER_SITE_OTHER): Payer: Medicare HMO | Admitting: Family

## 2024-03-10 ENCOUNTER — Encounter: Payer: Self-pay | Admitting: Family

## 2024-03-10 VITALS — BP 158/94 | HR 62 | Ht 72.0 in | Wt 187.8 lb

## 2024-03-10 DIAGNOSIS — E785 Hyperlipidemia, unspecified: Secondary | ICD-10-CM | POA: Diagnosis not present

## 2024-03-10 DIAGNOSIS — I25118 Atherosclerotic heart disease of native coronary artery with other forms of angina pectoris: Secondary | ICD-10-CM

## 2024-03-10 DIAGNOSIS — I152 Hypertension secondary to endocrine disorders: Secondary | ICD-10-CM | POA: Diagnosis not present

## 2024-03-10 DIAGNOSIS — E1142 Type 2 diabetes mellitus with diabetic polyneuropathy: Secondary | ICD-10-CM

## 2024-03-10 DIAGNOSIS — E1159 Type 2 diabetes mellitus with other circulatory complications: Secondary | ICD-10-CM

## 2024-03-10 DIAGNOSIS — E1169 Type 2 diabetes mellitus with other specified complication: Secondary | ICD-10-CM | POA: Diagnosis not present

## 2024-03-10 DIAGNOSIS — N1832 Chronic kidney disease, stage 3b: Secondary | ICD-10-CM

## 2024-03-10 MED ORDER — LOSARTAN POTASSIUM 25 MG PO TABS: 25.0000 mg | ORAL_TABLET | Freq: Every day | ORAL | 1 refills | Status: DC

## 2024-03-10 NOTE — Patient Instructions (Addendum)
 We are starting you on losartan for your blood pressure today.  Please take your blood pressure once daily, in the morning, before you eat or drink anything.  Keep a record of these numbers and bring them to your next appointment.

## 2024-03-10 NOTE — Progress Notes (Signed)
 Established Patient Office Visit  Subjective:  Patient ID: [name] [name], male    DOB: [birth year]  Age: 78 y.o. MRN: [id number]  Chief Complaint  Patient presents with   Follow-up    3 month follow up    Patient is here today for his 3 months follow up.  He has been feeling fairly well since last appointment.   He does not have additional concerns to discuss today.  Labs are not due today. He needs refills.   I have reviewed his active problem list, medication list, allergies, notes from last encounter, lab results for his appointment today.      No other concerns at this time.   Past Medical History:  Diagnosis Date   Anemia    Anemia of chronic disease 06/09/2023   Anginal pain (HCC)    Bradycardia    Cancer (HCC)    CKD stage 3b, GFR 30-44 ml/min (HCC) 05/21/2023   Coronary artery disease    x 1 stent   Depression    Diabetes mellitus without complication (HCC)    GERD (gastroesophageal reflux disease)    History of kidney stones    Hyperlipidemia    Hyperlipidemia associated with type 2 diabetes mellitus (HCC) 04/04/2010   Qualifier: Diagnosis of  By: Wynetta CMA, Carol     Hypertension    Nephrolithiasis    Partial Large bowel obstruction (HCC)    Rectal adenocarcinoma (HCC) 08/07/2022    Past Surgical History:  Procedure Laterality Date   CARDIAC CATHETERIZATION     CATARACT EXTRACTION     COLONOSCOPY N/A 04/28/2024   Procedure: COLONOSCOPY;  Surgeon: [name], MD;  Location: [facility];  Service: Endoscopy;  Laterality: N/A;  DM   COLONOSCOPY WITH PROPOFOL  N/A 05/05/2023   Procedure: COLONOSCOPY WITH PROPOFOL ;  Surgeon: [name], MD;  Location: [facility];  Service: Endoscopy;  Laterality: N/A;  Patient having surgery tomorrow, 05/06/2023. Ok'd per [place] and office   CORONARY ANGIOPLASTY     EYE SURGERY     FLEXIBLE SIGMOIDOSCOPY N/A 07/27/2022   Procedure: FLEXIBLE SIGMOIDOSCOPY;  Surgeon: [name], MD;   Location: [facility];  Service: Endoscopy;  Laterality: N/A;   ILEOSTOMY CLOSURE N/A 09/29/2023   Procedure: OPEN TAKEDOWN OF LOOP ILEOSTOMY, LEFT TAP BLOCK;  Surgeon: [name], MD;  Location: [facility];  Service: General;  Laterality: N/A;   KIDNEY STONE SURGERY     NECK SURGERY  12/18/2011   nephrolithiasis     PORTACATH PLACEMENT Right 08/24/2022   Procedure: INSERTION PORT-A-CATH;  Surgeon: [name], MD;  Location: [facility];  Service: General;  Laterality: Right;   RECTAL EXAM UNDER ANESTHESIA N/A 09/29/2023   Procedure: RECTAL EXAM UNDER ANESTHESIA;  Surgeon: [name], MD;  Location: [facility];  Service: General;  Laterality: N/A;   stent (other)     TRANSVERSE LOOP COLOSTOMY N/A 07/28/2022   Procedure: TRANSVERSE LOOP COLOSTOMY;  Surgeon: [name], MD;  Location: [facility];  Service: General;  Laterality: N/A;   XI ROBOTIC ASSISTED LOWER ANTERIOR RESECTION N/A 05/06/2023   Procedure: XI ROBOTIC ASSISTED LOWER ANTERIOR RECTOSIGMOID RESECTION, TAKEDOWN OF OLD LOOP COLOSTOMY WITH ANASTOMOSIS, MOBILIZATION OF SPLENIC FLEXURE OF COLON, AND NEW DIVERTING LOOP ILEOSTOMY, INTRAOPERATIVE ASSESSMENT OF TISSUE VASCULAR PERFUSION USING ICG (INDOCYANINE GREEN ) IMMUNOFLUORESCENCE, FLEXIBLE SIGMOIDOSCOPY;  Surgeon: [name], MD;  Location: [facility];  Service: Lorna Ano    Social History   Socioeconomic History   Marital status: Married    Spouse  name: Donia   Number of children: Not on file   Years of education: Not on file   Highest education level: Not on file  Occupational History   Not on file  Tobacco Use   Smoking status: Some Days    Current packs/day: 0.25    Average packs/day: 0.3 packs/day for 36.0 years (9.0 ttl pk-yrs)    Types: Cigarettes   Smokeless tobacco: Never   Tobacco comments:    Smokes 6 cigarettes daily   Vaping Use   Vaping status: Never Used  Substance and Sexual Activity   Alcohol use: No   Drug use: No   Sexual activity: Not  on file  Other Topics Concern   Not on file  Social History Narrative   Not on file   Social Drivers of Health   Financial Resource Strain: Not on file  Food Insecurity: Patient Declined (09/29/2023)   Hunger Vital Sign    Worried About Running Out of Food in the Last Year: Patient declined    Ran Out of Food in the Last Year: Patient declined  Transportation Needs: Patient Declined (09/29/2023)   PRAPARE - Administrator, Civil Service (Medical): Patient declined    Lack of Transportation (Non-Medical): Patient declined  Physical Activity: Not on file  Stress: Not on file  Social Connections: Not on file  Intimate Partner Violence: Patient Declined (09/29/2023)   Humiliation, Afraid, Rape, and Kick questionnaire    Fear of Current or Ex-Partner: Patient declined    Emotionally Abused: Patient declined    Physically Abused: Patient declined    Sexually Abused: Patient declined    Family History  Problem Relation Age of Onset   Heart disease Father     No Known Allergies  Review of Systems  Gastrointestinal:  Positive for constipation.  All other systems reviewed and are negative.      Objective:   BP (!) 158/94   Pulse 62   Ht 6' (1.829 m)   Wt 187 lb 12.8 oz (85.2 kg)   SpO2 98%   BMI 25.47 kg/m   Vitals:   03/10/24 1059  BP: (!) 158/94  Pulse: 62  Height: 6' (1.829 m)  Weight: 187 lb 12.8 oz (85.2 kg)  SpO2: 98%  BMI (Calculated): 25.46    Physical Exam Vitals and nursing note reviewed.  Constitutional:      Appearance: Normal appearance. He is normal weight.  HENT:     Head: Normocephalic and atraumatic.   Eyes:     Extraocular Movements: Extraocular movements intact.     Conjunctiva/sclera: Conjunctivae normal.     Pupils: Pupils are equal, round, and reactive to light.    Cardiovascular:     Rate and Rhythm: Normal rate and regular rhythm.     Pulses: Normal pulses.     Heart sounds: Normal heart sounds.  Pulmonary:      Effort: Pulmonary effort is normal.     Breath sounds: Normal breath sounds.  Abdominal:     General: Abdomen is flat.     Tenderness: There is no abdominal tenderness.   Musculoskeletal:        General: Normal range of motion.     Cervical back: Normal range of motion.   Neurological:     General: No focal deficit present.     Mental Status: He is alert and oriented to person, place, and time. Mental status is at baseline.   Psychiatric:  Mood and Affect: Mood normal.        Behavior: Behavior normal.        Thought Content: Thought content normal.        Judgment: Judgment normal.      No results found for any visits on 03/10/24.  Recent Results (from the past 2160 hours)  CEA     Status: Abnormal   Collection Time: 02/16/24  1:52 PM  Result Value Ref Range   CEA 7.6 (H) 0.0 - 4.7 ng/mL    Comment: (NOTE)                             Nonsmokers          <3.9                             Smokers             <5.6 [employer] Electrochemiluminescence Immunoassay (ECLIA) Values obtained with different assay methods or kits cannot be used interchangeably.  Results cannot be interpreted as absolute evidence of the presence or absence of malignant disease. Performed At: [facility] [address] [place], [place] [id number] Jennette Shorter MD Ey:[phone]   CMP (Cancer Center only)     Status: Abnormal   Collection Time: 02/16/24  1:52 PM  Result Value Ref Range   Sodium 136 135 - 145 mmol/L   Potassium 3.7 3.5 - 5.1 mmol/L   Chloride 104 98 - 111 mmol/L   CO2 24 22 - 32 mmol/L   Glucose, Bld 127 (H) 70 - 99 mg/dL    Comment: Glucose reference range applies only to samples taken after fasting for at least 8 hours.   BUN 29 (H) 8 - 23 mg/dL   Creatinine 8.52 (H) 9.38 - 1.24 mg/dL   Calcium  8.8 (L) 8.9 - 10.3 mg/dL   Total Protein 6.7 6.5 - 8.1 g/dL   Albumin  3.9 3.5 - 5.0 g/dL   AST 19 15 - 41 U/L   ALT 19 0 - 44 U/L   Alkaline Phosphatase 71 38  - 126 U/L   Total Bilirubin 0.4 0.0 - 1.2 mg/dL   GFR, Estimated 49 (L) >60 mL/min    Comment: (NOTE) Calculated using the CKD-EPI Creatinine Equation (2021)    Anion gap 8 5 - 15    Comment: Performed at [facility], [address] [place], [place] [address]  CBC with Differential/Platelet     Status: Abnormal   Collection Time: 02/16/24  1:52 PM  Result Value Ref Range   WBC 5.6 4.0 - 10.5 K/uL   RBC 3.70 (L) 4.22 - 5.81 MIL/uL   Hemoglobin 11.2 (L) 13.0 - 17.0 g/dL   HCT 64.8 (L) 60.9 - 47.9 %   MCV 94.9 80.0 - 100.0 fL   MCH 30.3 26.0 - 34.0 pg   MCHC 31.9 30.0 - 36.0 g/dL   RDW 85.4 88.4 - 84.4 %   Platelets 144 (L) 150 - 400 K/uL   nRBC 0.0 0.0 - 0.2 %   Neutrophils Relative % 79 %   Neutro Abs 4.4 1.7 - 7.7 K/uL   Lymphocytes Relative 9 %   Lymphs Abs 0.5 (L) 0.7 - 4.0 K/uL   Monocytes Relative 9 %   Monocytes Absolute 0.5 0.1 - 1.0 K/uL   Eosinophils Relative 3 %   Eosinophils Absolute 0.2 0.0 - 0.5  K/uL   Basophils Relative 0 %   Basophils Absolute 0.0 0.0 - 0.1 K/uL   Immature Granulocytes 0 %   Abs Immature Granulocytes 0.02 0.00 - 0.07 K/uL    Comment: Performed at [facility], [address] [place], [place] [address]  CEA     Status: Abnormal   Collection Time: 03/20/24 12:40 PM  Result Value Ref Range   CEA 4.9 (H) 0.0 - 4.7 ng/mL    Comment: (NOTE)                             Nonsmokers          <3.9                             Smokers             <5.6 [employer] Electrochemiluminescence Immunoassay (ECLIA) Values obtained with different assay methods or kits cannot be used interchangeably.  Results cannot be interpreted as absolute evidence of the presence or absence of malignant disease. Performed At: [facility] [address] [place], [place] [id number] Jennette Shorter MD Ey:[phone]   Glucose, capillary     Status: Abnormal   Collection Time: 04/28/24  9:12 AM  Result Value Ref Range   Glucose-Capillary 124  (H) 70 - 99 mg/dL    Comment: Glucose reference range applies only to samples taken after fasting for at least 8 hours.       Assessment & Plan CKD stage 3b, GFR 30-44 ml/min [facility]) Patient is seen by Nephrology, who manage this condition.  He is well controlled with current therapy.   Will defer to them for further changes to plan of care. Type 2 diabetes mellitus with diabetic polyneuropathy, without long-term current use of insulin  (HCC) Checking labs today. Will call pt. With results  Continue current diabetes POC, as patient has been well controlled on current regimen.  Will adjust meds if needed based on labs.   Hypertension associated with diabetes (HCC) Blood pressure elevated today.  Starting patient on Losartan .  I have asked pt to take his blood pressures in the morning, and to let me know if they remain elevated.  Continue current other therapy.  Will reassess at follow up.   Hyperlipidemia associated with type 2 diabetes mellitus (HCC) Checking labs today.  Continue current therapy for lipid control. Will modify as needed based on labwork results.   Coronary artery disease of native artery of native heart with stable angina pectoris [facility]) Patient is seen by Cardiology , who manage this condition.  He is well controlled with current therapy.   Will defer to them for further changes to plan of care.    Return in about 3 months (around 06/09/2024).   Total time spent: 20 minutes  ALAN CHRISTELLA ARRANT, FNP  03/10/2024   This document may have been prepared by [facility] Voice Recognition software and as such may include unintentional dictation errors.

## 2024-03-20 ENCOUNTER — Inpatient Hospital Stay: Attending: Oncology

## 2024-03-20 DIAGNOSIS — C20 Malignant neoplasm of rectum: Secondary | ICD-10-CM | POA: Diagnosis not present

## 2024-03-21 LAB — CEA: CEA: 4.9 ng/mL — ABNORMAL HIGH (ref 0.0–4.7)

## 2024-03-22 DIAGNOSIS — Z961 Presence of intraocular lens: Secondary | ICD-10-CM | POA: Diagnosis not present

## 2024-03-22 DIAGNOSIS — E119 Type 2 diabetes mellitus without complications: Secondary | ICD-10-CM | POA: Diagnosis not present

## 2024-03-22 DIAGNOSIS — Z0100 Encounter for examination of eyes and vision without abnormal findings: Secondary | ICD-10-CM | POA: Diagnosis not present

## 2024-03-29 ENCOUNTER — Inpatient Hospital Stay

## 2024-03-29 DIAGNOSIS — Z95828 Presence of other vascular implants and grafts: Secondary | ICD-10-CM

## 2024-03-29 DIAGNOSIS — C20 Malignant neoplasm of rectum: Secondary | ICD-10-CM | POA: Diagnosis not present

## 2024-03-29 MED ORDER — HEPARIN SOD (PORK) LOCK FLUSH 100 UNIT/ML IV SOLN
500.0000 [IU] | Freq: Once | INTRAVENOUS | Status: AC
  Administered 2024-03-29: 500 [IU] via INTRAVENOUS
  Filled 2024-03-29: qty 5

## 2024-03-29 NOTE — Progress Notes (Signed)
 Survivorship Care Plan visit completed.  Treatment summary reviewed and given to patient.  ASCO answers booklet reviewed and given to patient.  CARE program and Cancer Transitions discussed with patient along with other resources cancer center offers to patients and caregivers.  Patient verbalized understanding.

## 2024-04-07 ENCOUNTER — Other Ambulatory Visit: Payer: Self-pay

## 2024-04-08 ENCOUNTER — Other Ambulatory Visit: Payer: Self-pay | Admitting: Cardiovascular Disease

## 2024-04-21 ENCOUNTER — Ambulatory Visit: Admit: 2024-04-21 | Payer: Medicare HMO

## 2024-04-21 SURGERY — COLONOSCOPY WITH PROPOFOL
Anesthesia: General

## 2024-04-28 ENCOUNTER — Ambulatory Visit
Admission: RE | Admit: 2024-04-28 | Discharge: 2024-04-28 | Disposition: A | Discharge status: Home / Self Care | Attending: Gastroenterology | Admitting: Gastroenterology

## 2024-04-28 ENCOUNTER — Encounter
Admission: RE | Disposition: A | Discharge status: Home / Self Care | Payer: Self-pay | Source: Home / Self Care | Attending: Gastroenterology

## 2024-04-28 ENCOUNTER — Ambulatory Visit: Admitting: Anesthesiology

## 2024-04-28 ENCOUNTER — Other Ambulatory Visit: Payer: Self-pay

## 2024-04-28 DIAGNOSIS — Z980 Intestinal bypass and anastomosis status: Secondary | ICD-10-CM | POA: Diagnosis not present

## 2024-04-28 DIAGNOSIS — Z1211 Encounter for screening for malignant neoplasm of colon: Secondary | ICD-10-CM | POA: Diagnosis not present

## 2024-04-28 DIAGNOSIS — D631 Anemia in chronic kidney disease: Secondary | ICD-10-CM | POA: Insufficient documentation

## 2024-04-28 DIAGNOSIS — Z85048 Personal history of other malignant neoplasm of rectum, rectosigmoid junction, and anus: Secondary | ICD-10-CM | POA: Insufficient documentation

## 2024-04-28 DIAGNOSIS — Z955 Presence of coronary angioplasty implant and graft: Secondary | ICD-10-CM | POA: Diagnosis not present

## 2024-04-28 DIAGNOSIS — Z7984 Long term (current) use of oral hypoglycemic drugs: Secondary | ICD-10-CM | POA: Diagnosis not present

## 2024-04-28 DIAGNOSIS — I2510 Atherosclerotic heart disease of native coronary artery without angina pectoris: Secondary | ICD-10-CM | POA: Diagnosis not present

## 2024-04-28 DIAGNOSIS — E1122 Type 2 diabetes mellitus with diabetic chronic kidney disease: Secondary | ICD-10-CM | POA: Insufficient documentation

## 2024-04-28 DIAGNOSIS — Z09 Encounter for follow-up examination after completed treatment for conditions other than malignant neoplasm: Secondary | ICD-10-CM | POA: Diagnosis not present

## 2024-04-28 DIAGNOSIS — I129 Hypertensive chronic kidney disease with stage 1 through stage 4 chronic kidney disease, or unspecified chronic kidney disease: Secondary | ICD-10-CM | POA: Diagnosis not present

## 2024-04-28 DIAGNOSIS — Z08 Encounter for follow-up examination after completed treatment for malignant neoplasm: Secondary | ICD-10-CM | POA: Diagnosis not present

## 2024-04-28 DIAGNOSIS — N1832 Chronic kidney disease, stage 3b: Secondary | ICD-10-CM | POA: Diagnosis not present

## 2024-04-28 DIAGNOSIS — Z794 Long term (current) use of insulin: Secondary | ICD-10-CM | POA: Diagnosis not present

## 2024-04-28 DIAGNOSIS — F17210 Nicotine dependence, cigarettes, uncomplicated: Secondary | ICD-10-CM | POA: Diagnosis not present

## 2024-04-28 LAB — GLUCOSE, CAPILLARY: Glucose-Capillary: 124 mg/dL — ABNORMAL HIGH (ref 70–99)

## 2024-04-28 SURGERY — COLONOSCOPY
Anesthesia: General

## 2024-04-28 MED ORDER — DEXMEDETOMIDINE HCL IN NACL 80 MCG/20ML IV SOLN
INTRAVENOUS | Status: DC | PRN
Start: 2024-04-28 — End: 2024-04-28
  Administered 2024-04-28: 20 ug via INTRAVENOUS

## 2024-04-28 MED ORDER — LIDOCAINE HCL (CARDIAC) PF 100 MG/5ML IV SOSY
PREFILLED_SYRINGE | INTRAVENOUS | Status: DC | PRN
  Administered 2024-04-28: 80 mg via INTRAVENOUS

## 2024-04-28 MED ORDER — SODIUM CHLORIDE 0.9 % IV SOLN: INTRAVENOUS | Status: DC

## 2024-04-28 MED ORDER — PROPOFOL 500 MG/50ML IV EMUL
INTRAVENOUS | Status: DC | PRN
  Administered 2024-04-28: 75 ug/kg/min via INTRAVENOUS

## 2024-04-28 MED ORDER — PROPOFOL 10 MG/ML IV BOLUS
INTRAVENOUS | Status: DC | PRN
  Administered 2024-04-28: 40 mg via INTRAVENOUS

## 2024-04-28 NOTE — Interval H&P Note (Signed)
 History and Physical Interval Note:  04/28/2024 9:46 AM  [name] [name]  has presented today for surgery, with the diagnosis of History of rectal cancer (Z85.048).  The various methods of treatment have been discussed with the patient and family. After consideration of risks, benefits and other options for treatment, the patient has consented to  Procedure(s) with comments: COLONOSCOPY (N/A) - DM as a surgical intervention.  The patient's history has been reviewed, patient examined, no change in status, stable for surgery.  I have reviewed the patient's chart and labs.  Questions were answered to the patient's satisfaction.     Shane Darling  Ok to proceed with colonoscopy

## 2024-04-28 NOTE — Anesthesia Preprocedure Evaluation (Signed)
 Anesthesia Evaluation  Patient identified by MRN, date of birth, ID band Patient awake    Reviewed: Allergy & Precautions, NPO status , Patient's Chart, lab work & pertinent test results  Airway Mallampati: II  TM Distance: >3 FB Neck ROM: Full    Dental  (+) Edentulous Upper, Edentulous Lower   Pulmonary neg pulmonary ROS, COPD,  COPD inhaler, Current Smoker and Patient abstained from smoking.   Pulmonary exam normal  + decreased breath sounds      Cardiovascular Exercise Tolerance: Poor hypertension, Pt. on medications + CAD and + Cardiac Stents  negative cardio ROS Normal cardiovascular exam Rhythm:Regular Rate:Normal     Neuro/Psych    Depression    negative neurological ROS  negative psych ROS   GI/Hepatic negative GI ROS, Neg liver ROS,GERD  Medicated,,  Endo/Other  negative endocrine ROSdiabetes, Type 2, Oral Hypoglycemic Agents    Renal/GU negative Renal ROS  negative genitourinary   Musculoskeletal   Abdominal   Peds negative pediatric ROS (+)  Hematology negative hematology ROS (+) Blood dyscrasia, anemia   Anesthesia Other Findings Past Medical History: No date: Anemia 06/09/2023: Anemia of chronic disease No date: Anginal pain (HCC) No date: Bradycardia No date: Cancer (HCC) 05/21/2023: CKD stage 3b, GFR 30-44 ml/min (HCC) No date: Coronary artery disease     Comment:  x 1 stent No date: Depression No date: Diabetes mellitus without complication (HCC) No date: GERD (gastroesophageal reflux disease) No date: History of kidney stones No date: Hyperlipidemia 04/04/2010: Hyperlipidemia associated with type 2 diabetes mellitus  (HCC)     Comment:  Qualifier: Diagnosis of  By: Andrena Bang, CMA, Carol   No date: Hypertension No date: Nephrolithiasis No date: Partial Large bowel obstruction (HCC) 08/07/2022: Rectal adenocarcinoma (HCC)  Past Surgical History: No date: CARDIAC CATHETERIZATION No date:  CATARACT EXTRACTION 05/05/2023: COLONOSCOPY WITH PROPOFOL ; N/A     Comment:  Procedure: COLONOSCOPY WITH PROPOFOL ;  Surgeon:               Shane Darling, MD;  Location: [facility];                Service: Endoscopy;  Laterality: N/A;  Patient having               surgery tomorrow, 05/06/2023. Ok'd per [place] and office No date: CORONARY ANGIOPLASTY No date: EYE SURGERY 07/27/2022: FLEXIBLE SIGMOIDOSCOPY; N/A     Comment:  Procedure: FLEXIBLE SIGMOIDOSCOPY;  Surgeon: Shane Darling, MD;  Location: [facility];  Service:               Endoscopy;  Laterality: N/A; 09/29/2023: ILEOSTOMY CLOSURE; N/A     Comment:  Procedure: OPEN TAKEDOWN OF LOOP ILEOSTOMY, LEFT TAP               BLOCK;  Surgeon: Candyce Champagne, MD;  Location: [facility];                Service: General;  Laterality: N/A; No date: KIDNEY STONE SURGERY 12/18/2011: NECK SURGERY No date: nephrolithiasis 08/24/2022: PORTACATH PLACEMENT; Right     Comment:  Procedure: INSERTION PORT-A-CATH;  Surgeon: Flynn Hylan, MD;  Location: [facility];  Service: General;                Laterality: Right; 09/29/2023: RECTAL EXAM UNDER ANESTHESIA; N/A  Comment:  Procedure: RECTAL EXAM UNDER ANESTHESIA;  Surgeon:               Candyce Champagne, MD;  Location: [facility];  Service: General;               Laterality: N/A; No date: stent (other) 07/28/2022: TRANSVERSE LOOP COLOSTOMY; N/A     Comment:  Procedure: TRANSVERSE LOOP COLOSTOMY;  Surgeon:               Flynn Hylan, MD;  Location: [facility];  Service:               General;  Laterality: N/A; 05/06/2023: XI ROBOTIC ASSISTED LOWER ANTERIOR RESECTION; N/A     Comment:  Procedure: XI ROBOTIC ASSISTED LOWER ANTERIOR               RECTOSIGMOID RESECTION, TAKEDOWN OF OLD LOOP COLOSTOMY               WITH ANASTOMOSIS, MOBILIZATION OF SPLENIC FLEXURE OF               COLON, AND NEW DIVERTING LOOP ILEOSTOMY, INTRAOPERATIVE               ASSESSMENT OF  TISSUE VASCULAR PERFUSION USING ICG               (INDOCYANINE GREEN ) IMMUNOFLUORESCENCE, FLEXIBLE               SIGMOIDOSCOPY;  Surgeon: Candyce Champagne, MD;  Location: [facility];  Service: General;  Lateralit  BMI    Body Mass Index: 25.23 kg/m      Reproductive/Obstetrics negative OB ROS                             Anesthesia Physical Anesthesia Plan  ASA: 3  Anesthesia Plan: General   Post-op Pain Management:    Induction: Intravenous  PONV Risk Score and Plan: Propofol  infusion and TIVA  Airway Management Planned: Natural Airway and Nasal Cannula  Additional Equipment:   Intra-op Plan:   Post-operative Plan:   Informed Consent: I have reviewed the patients History and Physical, chart, labs and discussed the procedure including the risks, benefits and alternatives for the proposed anesthesia with the patient or authorized representative who has indicated his/her understanding and acceptance.     Dental Advisory Given  Plan Discussed with: Anesthesiologist, CRNA and Surgeon  Anesthesia Plan Comments:        Anesthesia Quick Evaluation

## 2024-04-28 NOTE — Op Note (Signed)
 [facility] Gastroenterology Patient Name: [name] [name] Procedure Date: 04/28/2024 9:36 AM MRN: [id number] Account #: [id number] Date of Birth: 06-06-1946 Admit Type: Outpatient Age: 78 Room: [facility] ENDO ROOM 3 Gender: Male Note Status: Finalized Instrument Name: Charlyn Cooley [id number] Procedure:             Colonoscopy Indications:           High risk colon cancer surveillance: Personal history                         of colon cancer Providers:             Leida Puna MD, MD Referring MD:          No Local Md, MD (Referring MD) Medicines:             Monitored Anesthesia Care Complications:         No immediate complications. Procedure:             Pre-Anesthesia Assessment:                        - Prior to the procedure, a History and Physical was                         performed, and patient medications and allergies were                         reviewed. The patient is competent. The risks and                         benefits of the procedure and the sedation options and                         risks were discussed with the patient. All questions                         were answered and informed consent was obtained.                         Patient identification and proposed procedure were                         verified by the physician, the nurse, the                         anesthesiologist, the anesthetist and the technician                         in the endoscopy suite. Mental Status Examination:                         alert and oriented. Airway Examination: normal                         oropharyngeal airway and neck mobility. Respiratory                         Examination: clear to auscultation. CV Examination:  normal. Prophylactic Antibiotics: The patient does not                         require prophylactic antibiotics. Prior                         Anticoagulants: The patient has taken no anticoagulant                          or antiplatelet agents. ASA Grade Assessment: III - A                         patient with severe systemic disease. After reviewing                         the risks and benefits, the patient was deemed in                         satisfactory condition to undergo the procedure. The                         anesthesia plan was to use monitored anesthesia care                         (MAC). Immediately prior to administration of                         medications, the patient was re-assessed for adequacy                         to receive sedatives. The heart rate, respiratory                         rate, oxygen saturations, blood pressure, adequacy of                         pulmonary ventilation, and response to care were                         monitored throughout the procedure. The physical                         status of the patient was re-assessed after the                         procedure.                        After obtaining informed consent, the colonoscope was                         passed under direct vision. Throughout the procedure,                         the patient's blood pressure, pulse, and oxygen                         saturations were monitored continuously. The  Colonoscope was introduced through the anus and                         advanced to the the cecum, identified by appendiceal                         orifice and ileocecal valve. The colonoscopy was                         performed without difficulty. The patient tolerated                         the procedure well. The quality of the bowel                         preparation was fair. The ileocecal valve, appendiceal                         orifice, and rectum were photographed. Findings:      The perianal and digital rectal examinations were normal.      There was evidence of a prior end-to-end colo-colonic anastomosis in the       rectum. This was patent and was  characterized by healthy appearing       mucosa.      Retroflexion in the rectum was not performed due to anatomy. Impression:            - Preparation of the colon was fair.                        - Patent end-to-end colo-colonic anastomosis,                         characterized by healthy appearing mucosa.                        - No specimens collected. Recommendation:        - Discharge patient to home.                        - Resume previous diet.                        - Continue present medications.                        - Repeat colonoscopy in 1 year because the bowel                         preparation was suboptimal.                        - Return to referring physician as previously                         scheduled. Procedure Code(s):     --- Professional ---                        Z6109, Colorectal cancer screening; colonoscopy on  individual at high risk Diagnosis Code(s):     --- Professional ---                        [phone], Personal history of other malignant neoplasm                         of large intestine                        Z98.0, Intestinal bypass and anastomosis status CPT copyright 2022 American Medical Association. All rights reserved. The codes documented in this report are preliminary and upon coder review may  be revised to meet current compliance requirements. Leida Puna MD, MD 04/28/2024 10:08:56 AM Number of Addenda: 0 Note Initiated On: 04/28/2024 9:36 AM Scope Withdrawal Time: 0 hours 7 minutes 5 seconds  Total Procedure Duration: 0 hours 11 minutes 4 seconds  Estimated Blood Loss:  Estimated blood loss: none.      [facility]

## 2024-04-28 NOTE — H&P (Signed)
 Outpatient short stay form Pre-procedure 04/28/2024  Shane Darling, MD  Primary Physician: Trenda Frisk, FNP  Reason for visit:  History of rectal cancer  History of present illness:    78 y/o gentleman with history of CAD, DMII, hypertension, and rectal cancer here for surveillance. Had ostomy take down 6 months ago. Had a colonoscopy through transverse ostomy 1 year ago that was normal. No blood thinners.    Current Facility-Administered Medications:    0.9 %  sodium chloride  infusion, , Intravenous, Continuous, Tomeca Helm, Leanora Prophet, MD, Last Rate: 20 mL/hr at 04/28/24 1610, Continued from Pre-op at 04/28/24 0937  Medications Prior to Admission  Medication Sig Dispense Refill Last Dose/Taking   atorvastatin  (LIPITOR) 40 MG tablet TAKE 1 TABLET BY MOUTH EVERY DAY 90 tablet 3 04/27/2024   Cholecalciferol (VITAMIN D -3 PO) Take 1 tablet by mouth daily.   Past Week   Cyanocobalamin  (VITAMIN B-12 PO) Take 1 tablet by mouth daily.   Past Week   ferrous sulfate  325 (65 FE) MG tablet Take 1 tablet by mouth 2 (two) times daily with a meal.   Past Week   glimepiride  (AMARYL ) 4 MG tablet Take 4 mg by mouth daily with breakfast.   04/27/2024   isosorbide  mononitrate (IMDUR ) 30 MG 24 hr tablet TAKE 1 TABLET BY MOUTH EVERY DAY 90 tablet 3 04/28/2024 at  6:30 AM   losartan  (COZAAR ) 25 MG tablet Take 1 tablet (25 mg total) by mouth daily. 90 tablet 1 04/27/2024   metFORMIN  (GLUCOPHAGE ) 1000 MG tablet Take 1,000 mg by mouth daily.   Past Week   pioglitazone  (ACTOS ) 15 MG tablet Take 15 mg by mouth daily.   Past Week   aspirin  81 MG EC tablet Take 81 mg by mouth daily.        bisacodyl  (DULCOLAX) 5 MG EC tablet Take 5 mg by mouth daily as needed for moderate constipation.      diphenoxylate -atropine  (LOMOTIL ) 2.5-0.025 MG tablet Take 1 tablet by mouth 2 (two) times daily. 30 tablet 5    famotidine  (PEPCID ) 40 MG tablet TAKE 1 TABLET BY MOUTH EVERY DAY 90 tablet 3    loperamide  (IMODIUM ) 2 MG  capsule Take 2 mg by mouth as needed for diarrhea or loose stools.      nitroGLYCERIN  (NITROSTAT ) 0.4 MG SL tablet Place 1 tablet (0.4 mg total) under the tongue every 5 (five) minutes as needed for chest pain. 25 tablet 6    ondansetron  (ZOFRAN ) 8 MG tablet Take 8 mg by mouth every 8 (eight) hours as needed for nausea or vomiting.      psyllium (METAMUCIL) 58.6 % packet Take 1 packet by mouth daily.      psyllium (REGULOID) 0.52 g capsule Take 0.52 g by mouth.      traMADol  (ULTRAM ) 50 MG tablet Take 1-2 tablets (50-100 mg total) by mouth every 6 (six) hours as needed for moderate pain (pain score 4-6). 20 tablet 0      No Known Allergies   Past Medical History:  Diagnosis Date   Anemia    Anemia of chronic disease 06/09/2023   Anginal pain (HCC)    Bradycardia    Cancer (HCC)    CKD stage 3b, GFR 30-44 ml/min (HCC) 05/21/2023   Coronary artery disease    x 1 stent   Depression    Diabetes mellitus without complication (HCC)    GERD (gastroesophageal reflux disease)    History of kidney stones    Hyperlipidemia  Hyperlipidemia associated with type 2 diabetes mellitus (HCC) 04/04/2010   Qualifier: Diagnosis of  By: Andrena Bang CMA, Carol     Hypertension    Nephrolithiasis    Partial Large bowel obstruction (HCC)    Rectal adenocarcinoma (HCC) 08/07/2022    Review of systems:  Otherwise negative.    Physical Exam  Gen: Alert, oriented. Appears stated age.  HEENT: PERRLA. Lungs: No respiratory distress CV: RRR Abd: soft, benign, no masses Ext: No edema    Planned procedures: Proceed with colonoscopy. The patient understands the nature of the planned procedure, indications, risks, alternatives and potential complications including but not limited to bleeding, infection, perforation, damage to internal organs and possible oversedation/side effects from anesthesia. The patient agrees and gives consent to proceed.  Please refer to procedure notes for findings,  recommendations and patient disposition/instructions.     Shane Darling, MD [facility] Gastroenterology

## 2024-04-28 NOTE — Anesthesia Postprocedure Evaluation (Signed)
 Anesthesia Post Note  Patient: [name] [name]  Procedure(s) Performed: COLONOSCOPY  Patient location during evaluation: PACU Anesthesia Type: General Level of consciousness: awake and awake and alert Pain management: satisfactory to patient Vital Signs Assessment: post-procedure vital signs reviewed and stable Respiratory status: spontaneous breathing and nonlabored ventilation Cardiovascular status: blood pressure returned to baseline Anesthetic complications: no   No notable events documented.   Last Vitals:  Vitals:   04/28/24 1027 04/28/24 1037  BP: 108/62 115/67  Pulse: (!) 56 (!) 51  Resp: 14 15  Temp:    SpO2: 97% 98%    Last Pain:  Vitals:   04/28/24 1027  TempSrc:   PainSc: 0-No pain                 VAN STAVEREN,Lindey Renzulli

## 2024-04-28 NOTE — Transfer of Care (Signed)
 Immediate Anesthesia Transfer of Care Note  Patient: [name] [name]  Procedure(s) Performed: COLONOSCOPY  Patient Location: PACU  Anesthesia Type:General  Level of Consciousness: sedated  Airway & Oxygen Therapy: Patient Spontanous Breathing  Post-op Assessment: Report given to RN and Post -op Vital signs reviewed and stable  Post vital signs: Reviewed and stable  Last Vitals:  Vitals Value Taken Time  BP 108/64 04/28/24 10:07  Temp    Pulse 53 04/28/24 10:08  Resp 15 04/28/24 10:08  SpO2 99 % 04/28/24 10:08  Vitals shown include unfiled device data.  Last Pain:  Vitals:   04/28/24 0908  TempSrc: Tympanic         Complications: No notable events documented.

## 2024-05-02 DIAGNOSIS — E1169 Type 2 diabetes mellitus with other specified complication: Secondary | ICD-10-CM | POA: Diagnosis not present

## 2024-05-02 DIAGNOSIS — E1142 Type 2 diabetes mellitus with diabetic polyneuropathy: Secondary | ICD-10-CM | POA: Diagnosis not present

## 2024-05-02 DIAGNOSIS — E785 Hyperlipidemia, unspecified: Secondary | ICD-10-CM | POA: Diagnosis not present

## 2024-05-02 DIAGNOSIS — I152 Hypertension secondary to endocrine disorders: Secondary | ICD-10-CM | POA: Diagnosis not present

## 2024-05-02 DIAGNOSIS — E1159 Type 2 diabetes mellitus with other circulatory complications: Secondary | ICD-10-CM | POA: Diagnosis not present

## 2024-05-07 NOTE — Assessment & Plan Note (Signed)
Patient is seen by Cardiology, who manage this condition.  He is well controlled with current therapy.   Will defer to them for further changes to plan of care.  

## 2024-05-07 NOTE — Assessment & Plan Note (Signed)
 Patient is seen by Nephrology, who manage this condition.  He is well controlled with current therapy.   Will defer to them for further changes to plan of care.

## 2024-05-07 NOTE — Assessment & Plan Note (Signed)
 Checking labs today.  Continue current therapy for lipid control. Will modify as needed based on labwork results.

## 2024-05-07 NOTE — Assessment & Plan Note (Signed)
 Checking labs today. Will call pt. With results  Continue current diabetes POC, as patient has been well controlled on current regimen.  Will adjust meds if needed based on labs.

## 2024-05-07 NOTE — Assessment & Plan Note (Signed)
 Blood pressure elevated today.  Starting patient on Losartan .  I have asked pt to take his blood pressures in the morning, and to let me know if they remain elevated.  Continue current other therapy.  Will reassess at follow up.

## 2024-05-10 ENCOUNTER — Inpatient Hospital Stay: Attending: Oncology

## 2024-05-10 DIAGNOSIS — Z95828 Presence of other vascular implants and grafts: Secondary | ICD-10-CM

## 2024-05-10 DIAGNOSIS — C20 Malignant neoplasm of rectum: Secondary | ICD-10-CM | POA: Insufficient documentation

## 2024-05-10 DIAGNOSIS — Z79899 Other long term (current) drug therapy: Secondary | ICD-10-CM | POA: Diagnosis not present

## 2024-05-10 MED ORDER — HEPARIN SOD (PORK) LOCK FLUSH 100 UNIT/ML IV SOLN
500.0000 [IU] | Freq: Once | INTRAVENOUS | Status: AC
  Administered 2024-05-10: 500 [IU] via INTRAVENOUS
  Filled 2024-05-10: qty 5

## 2024-05-17 ENCOUNTER — Encounter: Payer: Self-pay | Admitting: Oncology

## 2024-06-09 ENCOUNTER — Encounter: Payer: Self-pay | Admitting: Family

## 2024-06-09 ENCOUNTER — Ambulatory Visit (INDEPENDENT_AMBULATORY_CARE_PROVIDER_SITE_OTHER): Admitting: Family

## 2024-06-09 DIAGNOSIS — E559 Vitamin D deficiency, unspecified: Secondary | ICD-10-CM | POA: Diagnosis not present

## 2024-06-09 DIAGNOSIS — E1142 Type 2 diabetes mellitus with diabetic polyneuropathy: Secondary | ICD-10-CM

## 2024-06-09 DIAGNOSIS — E782 Mixed hyperlipidemia: Secondary | ICD-10-CM | POA: Diagnosis not present

## 2024-06-09 DIAGNOSIS — E785 Hyperlipidemia, unspecified: Secondary | ICD-10-CM | POA: Diagnosis not present

## 2024-06-09 DIAGNOSIS — E1159 Type 2 diabetes mellitus with other circulatory complications: Secondary | ICD-10-CM | POA: Diagnosis not present

## 2024-06-09 DIAGNOSIS — E538 Deficiency of other specified B group vitamins: Secondary | ICD-10-CM

## 2024-06-09 DIAGNOSIS — N1832 Chronic kidney disease, stage 3b: Secondary | ICD-10-CM | POA: Diagnosis not present

## 2024-06-09 DIAGNOSIS — I152 Hypertension secondary to endocrine disorders: Secondary | ICD-10-CM

## 2024-06-09 DIAGNOSIS — R5383 Other fatigue: Secondary | ICD-10-CM | POA: Diagnosis not present

## 2024-06-09 DIAGNOSIS — E1169 Type 2 diabetes mellitus with other specified complication: Secondary | ICD-10-CM

## 2024-06-09 NOTE — Progress Notes (Signed)
 Established Patient Office Visit  Subjective:  Patient ID: [name] [name], male    DOB: 10-03-1946  Age: 78 y.o. MRN: [id number]  Chief Complaint  Patient presents with   Follow-up    3 month follow up    Patient is here today for his 3 months follow up.  He has been feeling fairly well since last appointment.   He does have additional concerns to discuss today.  He has been watching his blood pressures at home, and they were doing well until he got his new BP cuff, which seems to read slightly higher than his previous one.  He is feeling good otherwise, Colonoscopy was done.   Labs are due today.  He needs refills.   I have reviewed his active problem list, medication list, allergies, notes from last encounter, lab results for his appointment today.      No other concerns at this time.   Past Medical History:  Diagnosis Date   Anemia    Anemia of chronic disease 06/09/2023   Anginal pain (HCC)    Bradycardia    Cancer (HCC)    CKD stage 3b, GFR 30-44 ml/min (HCC) 05/21/2023   Coronary artery disease    x 1 stent   Depression    Diabetes mellitus without complication (HCC)    GERD (gastroesophageal reflux disease)    History of kidney stones    Hyperlipidemia    Hyperlipidemia associated with type 2 diabetes mellitus (HCC) 04/04/2010   Qualifier: Diagnosis of  By: Wynetta CMA, Carol     Hypertension    Nephrolithiasis    Partial Large bowel obstruction (HCC)    Rectal adenocarcinoma (HCC) 08/07/2022    Past Surgical History:  Procedure Laterality Date   CARDIAC CATHETERIZATION     CATARACT EXTRACTION     COLONOSCOPY N/A 04/28/2024   Procedure: COLONOSCOPY;  Surgeon: [name], MD;  Location: [facility] ENDOSCOPY;  Service: Endoscopy;  Laterality: N/A;  DM   COLONOSCOPY WITH PROPOFOL  N/A 05/05/2023   Procedure: COLONOSCOPY WITH PROPOFOL ;  Surgeon: [name], MD;  Location: [facility] ENDOSCOPY;  Service: Endoscopy;  Laterality: N/A;  Patient  having surgery tomorrow, 05/06/2023. Ok'd per [place] and office   CORONARY ANGIOPLASTY     EYE SURGERY     FLEXIBLE SIGMOIDOSCOPY N/A 07/27/2022   Procedure: FLEXIBLE SIGMOIDOSCOPY;  Surgeon: [name], MD;  Location: [facility] ENDOSCOPY;  Service: Endoscopy;  Laterality: N/A;   ILEOSTOMY CLOSURE N/A 09/29/2023   Procedure: OPEN TAKEDOWN OF LOOP ILEOSTOMY, LEFT TAP BLOCK;  Surgeon: [name], MD;  Location: [facility] ORS;  Service: General;  Laterality: N/A;   KIDNEY STONE SURGERY     NECK SURGERY  12/18/2011   nephrolithiasis     PORTACATH PLACEMENT Right 08/24/2022   Procedure: INSERTION PORT-A-CATH;  Surgeon: [name], MD;  Location: [facility] ORS;  Service: General;  Laterality: Right;   RECTAL EXAM UNDER ANESTHESIA N/A 09/29/2023   Procedure: RECTAL EXAM UNDER ANESTHESIA;  Surgeon: [name], MD;  Location: [facility] OR[facility];  Service: General;  Laterality: N/A;   stent (other)     TRANSVERSE LOOP COLOSTOMY N/A 07/28/2022   Procedure: TRANSVERSE LOOP COLOSTOMY;  Surgeon: [name], MD;  Location: [facility] ORS;  Service: General;  Laterality: N/A;   XI ROBOTIC ASSISTED LOWER ANTERIOR RESECTION N/A 05/06/2023   Procedure: XI ROBOTIC ASSISTED LOWER ANTERIOR RECTOSIGMOID RESECTION, TAKEDOWN OF OLD LOOP COLOSTOMY WITH ANASTOMOSIS, MOBILIZATION OF SPLENIC FLEXURE OF COLON, AND NEW DIVERTING LOOP ILEOSTOMY, INTRAOPERATIVE ASSESSMENT OF TISSUE  VASCULAR PERFUSION USING ICG (INDOCYANINE GREEN ) IMMUNOFLUORESCENCE, FLEXIBLE SIGMOIDOSCOPY;  Surgeon: [name], MD;  Location: [facility] OR[facility];  Service: General;  Lateralit    Social History   Socioeconomic History   Marital status: Married    Spouse name: Doris   Number of children: Not on file   Years of education: Not on file   Highest education level: Not on file  Occupational History   Not on file  Tobacco Use   Smoking status: Some Days    Current packs/day: 0.25    Average packs/day: 0.3 packs/day for 36.0 years (9.0 ttl pk-yrs)    Types:  Cigarettes   Smokeless tobacco: Never   Tobacco comments:    Smokes 6 cigarettes daily   Vaping Use   Vaping status: Never Used  Substance and Sexual Activity   Alcohol use: No   Drug use: No   Sexual activity: Not on file  Other Topics Concern   Not on file  Social History Narrative   Not on file   Social Drivers of Health   Financial Resource Strain: Not on file  Food Insecurity: Patient Declined (09/29/2023)   Hunger Vital Sign    Worried About Running Out of Food in the Last Year: Patient declined    Ran Out of Food in the Last Year: Patient declined  Transportation Needs: Patient Declined (09/29/2023)   PRAPARE - Administrator, Civil Service (Medical): Patient declined    Lack of Transportation (Non-Medical): Patient declined  Physical Activity: Not on file  Stress: Not on file  Social Connections: Not on file  Intimate Partner Violence: Patient Declined (09/29/2023)   Humiliation, Afraid, Rape, and Kick questionnaire    Fear of Current or Ex-Partner: Patient declined    Emotionally Abused: Patient declined    Physically Abused: Patient declined    Sexually Abused: Patient declined    Family History  Problem Relation Age of Onset   Heart disease Father     No Known Allergies  Review of Systems  All other systems reviewed and are negative.      Objective:   BP 134/72   Pulse 65   Ht 6' (1.829 m)   Wt 191 lb 3.2 oz (86.7 kg)   SpO2 97%   BMI 25.93 kg/m   Vitals:   06/09/24 1048  BP: 134/72  Pulse: 65  Height: 6' (1.829 m)  Weight: 191 lb 3.2 oz (86.7 kg)  SpO2: 97%  BMI (Calculated): 25.93    Physical Exam Vitals and nursing note reviewed.  Constitutional:      Appearance: Normal appearance. He is normal weight.  Eyes:     Pupils: Pupils are equal, round, and reactive to light.  Cardiovascular:     Rate and Rhythm: Normal rate and regular rhythm.     Pulses: Normal pulses.     Heart sounds: Normal heart sounds.  Pulmonary:      Effort: Pulmonary effort is normal.     Breath sounds: Normal breath sounds.  Neurological:     General: No focal deficit present.     Mental Status: He is alert.  Psychiatric:        Mood and Affect: Mood normal.        Behavior: Behavior normal.        Thought Content: Thought content normal.        Judgment: Judgment normal.      No results found for any visits on 06/09/24.  Recent Results (from the  past 2160 hours)  CEA     Status: Abnormal   Collection Time: 03/20/24 12:40 PM  Result Value Ref Range   CEA 4.9 (H) 0.0 - 4.7 ng/mL    Comment: (NOTE)                             Nonsmokers          <3.9                             Smokers             <5.6 [employer] Electrochemiluminescence Immunoassay (ECLIA) Values obtained with different assay methods or kits cannot be used interchangeably.  Results cannot be interpreted as absolute evidence of the presence or absence of malignant disease. Performed At: [facility] [address] [place], [place] [id number] Jennette Shorter MD Ey:[phone]   Glucose, capillary     Status: Abnormal   Collection Time: 04/28/24  9:12 AM  Result Value Ref Range   Glucose-Capillary 124 (H) 70 - 99 mg/dL    Comment: Glucose reference range applies only to samples taken after fasting for at least 8 hours.       Assessment & Plan Type 2 diabetes mellitus with diabetic polyneuropathy, without long-term current use of insulin  (HCC) Checking labs today. Will call pt. With results  Continue current diabetes POC, as patient has been well controlled on current regimen.  Will adjust meds if needed based on labs.   -CBC w/Diff -CMP w/eGFR -Hemoglobin A1C  Hyperlipidemia associated with type 2 diabetes mellitus (HCC) Checking labs today.  Continue current therapy for lipid control. Will modify as needed based on labwork results.   -CMP w/eGFR -Lipid Panel  CKD stage 3b, GFR 30-44 ml/min (HCC) Patient is seen by  nephrology, who manage this condition.  He is well controlled with current therapy.   Will defer to them for further changes to plan of care.  Hypertension associated with diabetes (HCC) Blood pressure well controlled with current medications.  Continue current therapy.  Will reassess at follow up.   - CBC w/Diff - CMP w/eGFR  Vitamin D  deficiency, unspecified Other fatigue B12 deficiency due to diet Checking labs today.  Will continue supplements as needed.   - Vitamin D  - Vitamin B12 - TSH     Return in about 3 months (around 09/09/2024) for AWV.   Total time spent: 20 minutes  ALAN CHRISTELLA ARRANT, FNP  06/09/2024   This document may have been prepared by [facility] Voice Recognition software and as such may include unintentional dictation errors.

## 2024-06-10 ENCOUNTER — Other Ambulatory Visit: Payer: Self-pay

## 2024-06-10 ENCOUNTER — Encounter: Payer: Self-pay | Admitting: Oncology

## 2024-06-10 LAB — CMP14+EGFR
ALT: 10 IU/L (ref 0–44)
AST: 13 IU/L (ref 0–40)
Albumin: 4.2 g/dL (ref 3.8–4.8)
Alkaline Phosphatase: 79 IU/L (ref 44–121)
BUN/Creatinine Ratio: 19 (ref 10–24)
BUN: 29 mg/dL — ABNORMAL HIGH (ref 8–27)
Bilirubin Total: 0.4 mg/dL (ref 0.0–1.2)
CO2: 20 mmol/L (ref 20–29)
Calcium: 9.3 mg/dL (ref 8.6–10.2)
Chloride: 103 mmol/L (ref 96–106)
Creatinine, Ser: 1.55 mg/dL — ABNORMAL HIGH (ref 0.76–1.27)
Globulin, Total: 2.1 g/dL (ref 1.5–4.5)
Glucose: 127 mg/dL — ABNORMAL HIGH (ref 70–99)
Potassium: 4.7 mmol/L (ref 3.5–5.2)
Sodium: 139 mmol/L (ref 134–144)
Total Protein: 6.3 g/dL (ref 6.0–8.5)
eGFR: 46 mL/min/1.73 — ABNORMAL LOW (ref >59)

## 2024-06-10 LAB — LIPID PANEL
Chol/HDL Ratio: 2.6 ratio (ref 0.0–5.0)
Cholesterol, Total: 88 mg/dL — ABNORMAL LOW (ref 100–199)
HDL: 34 mg/dL — ABNORMAL LOW (ref >39)
LDL Chol Calc (NIH): 36 mg/dL (ref 0–99)
Triglycerides: 87 mg/dL (ref 0–149)
VLDL Cholesterol Cal: 18 mg/dL (ref 5–40)

## 2024-06-10 LAB — VITAMIN D 25 HYDROXY (VIT D DEFICIENCY, FRACTURES): Vit D, 25-Hydroxy: 60.2 ng/mL (ref 30.0–100.0)

## 2024-06-10 LAB — CBC WITH DIFFERENTIAL/PLATELET
Basophils Absolute: 0 x10E3/uL (ref 0.0–0.2)
Basos: 1 %
EOS (ABSOLUTE): 0.2 x10E3/uL (ref 0.0–0.4)
Eos: 4 %
Hematocrit: 34 % — ABNORMAL LOW (ref 37.5–51.0)
Hemoglobin: 10.8 g/dL — ABNORMAL LOW (ref 13.0–17.7)
Immature Grans (Abs): 0 x10E3/uL (ref 0.0–0.1)
Immature Granulocytes: 0 %
Lymphocytes Absolute: 0.6 x10E3/uL — ABNORMAL LOW (ref 0.7–3.1)
Lymphs: 9 %
MCH: 30.9 pg (ref 26.6–33.0)
MCHC: 31.8 g/dL (ref 31.5–35.7)
MCV: 97 fL (ref 79–97)
Monocytes Absolute: 0.5 x10E3/uL (ref 0.1–0.9)
Monocytes: 7 %
Neutrophils Absolute: 5.2 x10E3/uL (ref 1.4–7.0)
Neutrophils: 78 %
Platelets: 153 x10E3/uL (ref 150–450)
RBC: 3.5 x10E6/uL — ABNORMAL LOW (ref 4.14–5.80)
RDW: 13.9 % (ref 11.6–15.4)
WBC: 6.6 x10E3/uL (ref 3.4–10.8)

## 2024-06-10 LAB — HEMOGLOBIN A1C
Est. average glucose Bld gHb Est-mCnc: 140 mg/dL
Hgb A1c MFr Bld: 6.5 % — ABNORMAL HIGH (ref 4.8–5.6)

## 2024-06-10 LAB — VITAMIN B12: Vitamin B-12: 1007 pg/mL (ref 232–1245)

## 2024-06-10 NOTE — Assessment & Plan Note (Signed)
 Checking labs today. Will call pt. With results  Continue current diabetes POC, as patient has been well controlled on current regimen.  Will adjust meds if needed based on labs.   -CBC w/Diff -CMP w/eGFR -Hemoglobin A1C

## 2024-06-10 NOTE — Assessment & Plan Note (Signed)
 Checking labs today.  Continue current therapy for lipid control. Will modify as needed based on labwork results.   -CMP w/eGFR -Lipid Panel

## 2024-06-10 NOTE — Assessment & Plan Note (Signed)
 Blood pressure well controlled with current medications.  Continue current therapy.  Will reassess at follow up.   - CBC w/Diff - CMP w/eGFR

## 2024-06-10 NOTE — Assessment & Plan Note (Signed)
 Patient is seen by nephrology, who manage this condition.  He is well controlled with current therapy.   Will defer to them for further changes to plan of care.

## 2024-06-21 ENCOUNTER — Inpatient Hospital Stay: Attending: Oncology

## 2024-06-21 ENCOUNTER — Other Ambulatory Visit: Payer: Self-pay | Admitting: *Deleted

## 2024-06-21 DIAGNOSIS — Z452 Encounter for adjustment and management of vascular access device: Secondary | ICD-10-CM | POA: Insufficient documentation

## 2024-06-21 DIAGNOSIS — C20 Malignant neoplasm of rectum: Secondary | ICD-10-CM | POA: Diagnosis not present

## 2024-06-21 MED ORDER — LIDOCAINE-PRILOCAINE 2.5-2.5 % EX CREA: 1.0000 | TOPICAL_CREAM | CUTANEOUS | 3 refills | Status: AC | PRN

## 2024-06-21 NOTE — Patient Instructions (Signed)

## 2024-08-09 ENCOUNTER — Telehealth: Payer: Self-pay | Admitting: Oncology

## 2024-08-09 NOTE — Telephone Encounter (Signed)
 Pt wife calling and states the pt had a colonoscopy a few weeks ago and now he is scheduled for a CT scan on 10/2 and she wants to know will that be too much radiation for the pt? [phone]

## 2024-08-14 ENCOUNTER — Telehealth: Payer: Self-pay | Admitting: Oncology

## 2024-08-14 NOTE — Telephone Encounter (Signed)
 Patient spouse Donia called to see if he still needs to get the CT prior to his appointment. Please call her at- [phone] she states this is the 2nd time she has called and she needs to know so he don't go if not needed.

## 2024-08-17 ENCOUNTER — Ambulatory Visit
Admission: RE | Admit: 2024-08-17 | Discharge: 2024-08-17 | Disposition: A | Discharge status: Home / Self Care | Source: Ambulatory Visit | Attending: Oncology | Admitting: Oncology

## 2024-08-17 DIAGNOSIS — K4020 Bilateral inguinal hernia, without obstruction or gangrene, not specified as recurrent: Secondary | ICD-10-CM | POA: Diagnosis not present

## 2024-08-17 DIAGNOSIS — N281 Cyst of kidney, acquired: Secondary | ICD-10-CM | POA: Diagnosis not present

## 2024-08-17 DIAGNOSIS — C20 Malignant neoplasm of rectum: Secondary | ICD-10-CM | POA: Diagnosis not present

## 2024-08-17 LAB — POCT I-STAT CREATININE: Creatinine, Ser: 1.1 mg/dL (ref 0.61–1.24)

## 2024-08-17 MED ORDER — IOHEXOL 300 MG/ML  SOLN
100.0000 mL | Freq: Once | INTRAMUSCULAR | Status: AC | PRN
  Administered 2024-08-17: 100 mL via INTRAVENOUS

## 2024-08-21 ENCOUNTER — Encounter: Payer: Self-pay | Admitting: Oncology

## 2024-08-25 ENCOUNTER — Other Ambulatory Visit: Payer: Self-pay | Admitting: *Deleted

## 2024-08-25 DIAGNOSIS — C20 Malignant neoplasm of rectum: Secondary | ICD-10-CM

## 2024-08-28 ENCOUNTER — Inpatient Hospital Stay: Admitting: Oncology

## 2024-08-28 ENCOUNTER — Encounter: Payer: Self-pay | Admitting: Oncology

## 2024-08-28 ENCOUNTER — Inpatient Hospital Stay: Attending: Oncology

## 2024-08-28 VITALS — BP 150/72 | HR 72 | Temp 97.6°F | Resp 18 | Wt 195.0 lb

## 2024-08-28 DIAGNOSIS — D649 Anemia, unspecified: Secondary | ICD-10-CM | POA: Diagnosis not present

## 2024-08-28 DIAGNOSIS — C20 Malignant neoplasm of rectum: Secondary | ICD-10-CM | POA: Diagnosis not present

## 2024-08-28 DIAGNOSIS — Z923 Personal history of irradiation: Secondary | ICD-10-CM | POA: Diagnosis not present

## 2024-08-28 DIAGNOSIS — R970 Elevated carcinoembryonic antigen [CEA]: Secondary | ICD-10-CM | POA: Diagnosis not present

## 2024-08-28 DIAGNOSIS — D696 Thrombocytopenia, unspecified: Secondary | ICD-10-CM | POA: Diagnosis not present

## 2024-08-28 DIAGNOSIS — N1832 Chronic kidney disease, stage 3b: Secondary | ICD-10-CM | POA: Insufficient documentation

## 2024-08-28 DIAGNOSIS — Z9221 Personal history of antineoplastic chemotherapy: Secondary | ICD-10-CM | POA: Diagnosis not present

## 2024-08-28 DIAGNOSIS — E1165 Type 2 diabetes mellitus with hyperglycemia: Secondary | ICD-10-CM | POA: Insufficient documentation

## 2024-08-28 DIAGNOSIS — F17210 Nicotine dependence, cigarettes, uncomplicated: Secondary | ICD-10-CM | POA: Diagnosis not present

## 2024-08-28 DIAGNOSIS — Z85048 Personal history of other malignant neoplasm of rectum, rectosigmoid junction, and anus: Secondary | ICD-10-CM | POA: Diagnosis not present

## 2024-08-28 LAB — CMP (CANCER CENTER ONLY)
ALT: 17 U/L (ref 0–44)
AST: 25 U/L (ref 15–41)
Albumin: 3.7 g/dL (ref 3.5–5.0)
Alkaline Phosphatase: 65 U/L (ref 38–126)
Anion gap: 10 (ref 5–15)
BUN: 28 mg/dL — ABNORMAL HIGH (ref 8–23)
CO2: 22 mmol/L (ref 22–32)
Calcium: 8.9 mg/dL (ref 8.9–10.3)
Chloride: 103 mmol/L (ref 98–111)
Creatinine: 1.56 mg/dL — ABNORMAL HIGH (ref 0.61–1.24)
GFR, Estimated: 46 mL/min — ABNORMAL LOW (ref >60)
Glucose, Bld: 111 mg/dL — ABNORMAL HIGH (ref 70–99)
Potassium: 4.3 mmol/L (ref 3.5–5.1)
Sodium: 135 mmol/L (ref 135–145)
Total Bilirubin: 0.5 mg/dL (ref 0.0–1.2)
Total Protein: 6.7 g/dL (ref 6.5–8.1)

## 2024-08-28 LAB — CBC WITH DIFFERENTIAL/PLATELET
Abs Immature Granulocytes: 0.03 K/uL (ref 0.00–0.07)
Basophils Absolute: 0 K/uL (ref 0.0–0.1)
Basophils Relative: 1 %
Eosinophils Absolute: 0.2 K/uL (ref 0.0–0.5)
Eosinophils Relative: 4 %
HCT: 32.2 % — ABNORMAL LOW (ref 39.0–52.0)
Hemoglobin: 10.6 g/dL — ABNORMAL LOW (ref 13.0–17.0)
Immature Granulocytes: 1 %
Lymphocytes Relative: 9 %
Lymphs Abs: 0.6 K/uL — ABNORMAL LOW (ref 0.7–4.0)
MCH: 30.8 pg (ref 26.0–34.0)
MCHC: 32.9 g/dL (ref 30.0–36.0)
MCV: 93.6 fL (ref 80.0–100.0)
Monocytes Absolute: 0.5 K/uL (ref 0.1–1.0)
Monocytes Relative: 8 %
Neutro Abs: 5.1 K/uL (ref 1.7–7.7)
Neutrophils Relative %: 77 %
Platelets: 145 K/uL — ABNORMAL LOW (ref 150–400)
RBC: 3.44 MIL/uL — ABNORMAL LOW (ref 4.22–5.81)
RDW: 14.4 % (ref 11.5–15.5)
WBC: 6.5 K/uL (ref 4.0–10.5)
nRBC: 0 % (ref 0.0–0.2)

## 2024-08-28 NOTE — Progress Notes (Unsigned)
 [name]  [phone]) [phone] [phone]) [phone]  ID: [name] [name] OB: Feb 10, 1946  MR#: [id number]  RDW#:[id number]  Patient Care Team: [name], FNP as PCP - General (Family Medicine) [name], MD as PCP - Cardiology (Cardiology) [name], MD as Consulting Physician (Cardiology) [name], RN as Oncology Nurse Navigator [name], [name], MD as Consulting Physician (Oncology) [name], MD as Consulting Physician (Colon and Rectal Surgery) [name], MD as Consulting Physician (Radiation Oncology)  CHIEF COMPLAINT: Pathologic stage IIa rectal adenocarcinoma.  INTERVAL HISTORY: Patient returns to clinic today for repeat laboratory work, further evaluation, and discussion of his imaging results.  He recently had some dental work done, but otherwise feels well.  He has no neurologic complaints.  He denies any recent fevers or illnesses.  He has no chest pain, shortness of breath, cough, or hemoptysis.  He denies any nausea, vomiting, or constipation.  He does not report any melena or hematochezia.  He has no urinary complaints.  Patient offers no further specific complaints today.   REVIEW OF SYSTEMS:   Review of Systems  Constitutional: Negative.  Negative for fever, malaise/fatigue and weight loss.  Respiratory: Negative.  Negative for cough, hemoptysis and shortness of breath.   Cardiovascular: Negative.  Negative for chest pain and leg swelling.  Gastrointestinal: Negative.  Negative for abdominal pain, blood in stool, diarrhea and melena.  Genitourinary: Negative.  Negative for dysuria.  Musculoskeletal: Negative.  Negative for back pain.  Skin: Negative.  Negative for rash.  Neurological: Negative.  Negative for dizziness, focal weakness, weakness and headaches.  Psychiatric/Behavioral: Negative.  The patient is not nervous/anxious.     As per HPI. Otherwise, a complete review of systems is  negative.  PAST MEDICAL HISTORY: Past Medical History:  Diagnosis Date  . Anemia   . Anemia of chronic disease 06/09/2023  . Anginal pain   . Bradycardia   . Cancer (HCC)   . CKD stage 3b, GFR 30-44 ml/min (HCC) 05/21/2023  . Coronary artery disease    x 1 stent  . Depression   . Diabetes mellitus without complication (HCC)   . GERD (gastroesophageal reflux disease)   . History of kidney stones   . Hyperlipidemia   . Hyperlipidemia associated with type 2 diabetes mellitus (HCC) 04/04/2010   Qualifier: Diagnosis of  By: Wynetta CMA, Carol    . Hypertension   . Nephrolithiasis   . Partial Large bowel obstruction (HCC)   . Rectal adenocarcinoma (HCC) 08/07/2022    PAST SURGICAL HISTORY: Past Surgical History:  Procedure Laterality Date  . CARDIAC CATHETERIZATION    . CATARACT EXTRACTION    . COLONOSCOPY N/A 04/28/2024   Procedure: COLONOSCOPY;  Surgeon: [name], MD;  Location: [facility] ENDOSCOPY;  Service: Endoscopy;  Laterality: N/A;  DM  . COLONOSCOPY WITH PROPOFOL  N/A 05/05/2023   Procedure: COLONOSCOPY WITH PROPOFOL ;  Surgeon: [name], MD;  Location: [facility] ENDOSCOPY;  Service: Endoscopy;  Laterality: N/A;  Patient having surgery tomorrow, 05/06/2023. Ok'd per [place] and office  . CORONARY ANGIOPLASTY    . EYE SURGERY    . FLEXIBLE SIGMOIDOSCOPY N/A 07/27/2022   Procedure: FLEXIBLE SIGMOIDOSCOPY;  Surgeon: [name], MD;  Location: [facility] ENDOSCOPY;  Service: Endoscopy;  Laterality: N/A;  . ILEOSTOMY CLOSURE N/A 09/29/2023   Procedure: OPEN TAKEDOWN OF LOOP ILEOSTOMY, LEFT TAP BLOCK;  Surgeon: [name], MD;  Location: [facility] ORS;  Service: General;  Laterality: N/A;  . KIDNEY STONE SURGERY    .  NECK SURGERY  12/18/2011  . nephrolithiasis    . PORTACATH PLACEMENT Right 08/24/2022   Procedure: INSERTION PORT-A-CATH;  Surgeon: [name], MD;  Location: [facility] ORS;  Service: General;  Laterality: Right;  . RECTAL EXAM UNDER ANESTHESIA N/A  09/29/2023   Procedure: RECTAL EXAM UNDER ANESTHESIA;  Surgeon: [name], MD;  Location: [facility] ORS;  Service: General;  Laterality: N/A;  . stent (other)    . TRANSVERSE LOOP COLOSTOMY N/A 07/28/2022   Procedure: TRANSVERSE LOOP COLOSTOMY;  Surgeon: [name], MD;  Location: [facility] ORS;  Service: General;  Laterality: N/A;  . XI ROBOTIC ASSISTED LOWER ANTERIOR RESECTION N/A 05/06/2023   Procedure: XI ROBOTIC ASSISTED LOWER ANTERIOR RECTOSIGMOID RESECTION, TAKEDOWN OF OLD LOOP COLOSTOMY WITH ANASTOMOSIS, MOBILIZATION OF SPLENIC FLEXURE OF COLON, AND NEW DIVERTING LOOP ILEOSTOMY, INTRAOPERATIVE ASSESSMENT OF TISSUE VASCULAR PERFUSION USING ICG (INDOCYANINE GREEN ) IMMUNOFLUORESCENCE, FLEXIBLE SIGMOIDOSCOPY;  Surgeon: [name], MD;  Location: [facility] ORS;  Service: General;  Lateralit    FAMILY HISTORY: Family History  Problem Relation Age of Onset  . Heart disease Father     ADVANCED DIRECTIVES (Y/N):  N  HEALTH MAINTENANCE: Social History   Tobacco Use  . Smoking status: Some Days    Current packs/day: 0.25    Average packs/day: 0.3 packs/day for 36.0 years (9.0 ttl pk-yrs)    Types: Cigarettes  . Smokeless tobacco: Never  . Tobacco comments:    Smokes 6 cigarettes daily   Vaping Use  . Vaping status: Never Used  Substance Use Topics  . Alcohol use: No  . Drug use: No     Colonoscopy:  PAP:  Bone density:  Lipid panel:  No Known Allergies  Current Outpatient Medications  Medication Sig Dispense Refill  . aspirin  81 MG EC tablet Take 81 mg by mouth daily.      . atorvastatin  (LIPITOR) 40 MG tablet TAKE 1 TABLET BY MOUTH EVERY DAY 90 tablet 3  . bisacodyl  (DULCOLAX) 5 MG EC tablet Take 5 mg by mouth daily as needed for moderate constipation.    . Cholecalciferol (VITAMIN D -3 PO) Take 1 tablet by mouth daily.    . Cyanocobalamin  (VITAMIN B-12 PO) Take 1 tablet by mouth daily.    . diphenoxylate -atropine  (LOMOTIL ) 2.5-0.025 MG tablet Take 1 tablet by mouth 2 (two)  times daily. 30 tablet 5  . famotidine  (PEPCID ) 40 MG tablet TAKE 1 TABLET BY MOUTH EVERY DAY 90 tablet 3  . ferrous sulfate  325 (65 FE) MG tablet Take 1 tablet by mouth 2 (two) times daily with a meal.    . glimepiride  (AMARYL ) 4 MG tablet Take 4 mg by mouth daily with breakfast.    . isosorbide  mononitrate (IMDUR ) 30 MG 24 hr tablet TAKE 1 TABLET BY MOUTH EVERY DAY 90 tablet 3  . lidocaine -prilocaine  (EMLA ) cream Apply 1 Application topically as needed. 30 g 3  . loperamide  (IMODIUM ) 2 MG capsule Take 2 mg by mouth as needed for diarrhea or loose stools.    . losartan  (COZAAR ) 25 MG tablet Take 1 tablet (25 mg total) by mouth daily. 90 tablet 1  . metFORMIN  (GLUCOPHAGE ) 1000 MG tablet Take 1,000 mg by mouth daily.    . nitroGLYCERIN  (NITROSTAT ) 0.4 MG SL tablet Place 1 tablet (0.4 mg total) under the tongue every 5 (five) minutes as needed for chest pain. 25 tablet 6  . ondansetron  (ZOFRAN ) 8 MG tablet Take 8 mg by mouth every 8 (eight) hours as needed for nausea or vomiting.    SABRA  pioglitazone  (ACTOS ) 15 MG tablet Take 15 mg by mouth daily.    . psyllium (METAMUCIL) 58.6 % packet Take 1 packet by mouth daily.    . psyllium (REGULOID) 0.52 g capsule Take 0.52 g by mouth.    . traMADol  (ULTRAM ) 50 MG tablet Take 1-2 tablets (50-100 mg total) by mouth every 6 (six) hours as needed for moderate pain (pain score 4-6). 20 tablet 0   No current facility-administered medications for this visit.    OBJECTIVE: Vitals:   08/28/24 1400  BP: (!) 150/72  Pulse: 72  Resp: 18  Temp: 97.6 F (36.4 C)  SpO2: 98%      Body mass index is 26.45 kg/m.    ECOG FS:0 - Asymptomatic  General: Well-developed, well-nourished, no acute distress. Eyes: Pink conjunctiva, anicteric sclera. HEENT: Normocephalic, moist mucous membranes. Lungs: No audible wheezing or coughing. Heart: Regular rate and rhythm. Abdomen: Soft, nontender, no obvious distention. Musculoskeletal: No edema, cyanosis, or  clubbing. Neuro: Alert, answering all questions appropriately. Cranial nerves grossly intact. Skin: No rashes or petechiae noted. Psych: Normal affect.  LAB RESULTS:  Lab Results  Component Value Date   NA 135 08/28/2024   K 4.3 08/28/2024   CL 103 08/28/2024   CO2 22 08/28/2024   GLUCOSE 111 (H) 08/28/2024   BUN 28 (H) 08/28/2024   CREATININE 1.56 (H) 08/28/2024   CALCIUM  8.9 08/28/2024   PROT 6.7 08/28/2024   ALBUMIN  3.7 08/28/2024   AST 25 08/28/2024   ALT 17 08/28/2024   ALKPHOS 65 08/28/2024   BILITOT 0.5 08/28/2024   GFRNONAA 46 (L) 08/28/2024   GFRAA >60 12/20/2014    Lab Results  Component Value Date   WBC 6.5 08/28/2024   NEUTROABS 5.1 08/28/2024   HGB 10.6 (L) 08/28/2024   HCT 32.2 (L) 08/28/2024   MCV 93.6 08/28/2024   PLT 145 (L) 08/28/2024     STUDIES: CT ABDOMEN PELVIS W CONTRAST Result Date: 08/22/2024 CLINICAL DATA:  Follow-up rectal cancer.  * Tracking Code: BO * EXAM: CT ABDOMEN AND PELVIS WITH CONTRAST TECHNIQUE: Multidetector CT imaging of the abdomen and pelvis was performed using the standard protocol following bolus administration of intravenous contrast. RADIATION DOSE REDUCTION: This exam was performed according to the departmental dose-optimization program which includes automated exposure control, adjustment of the mA and/or kV according to patient size and/or use of iterative reconstruction technique. CONTRAST:  OMNIPAQUE  IOHEXOL  300 MG/ML  SOLN COMPARISON:  CT January 20, 2024 FINDINGS: Lower chest: Similar bibasilar scarring/atelectasis. Hepatobiliary: No suspicious hepatic lesion. Gallbladder is unremarkable. No biliary ductal dilation. Pancreas: Unremarkable. No pancreatic ductal dilatation or surrounding inflammatory changes. Spleen: Normal in size without focal abnormality. Adrenals/Urinary Tract: Adrenal glands are unremarkable. Kidneys are normal, without renal calculi, solid lesion, or hydronephrosis. Bilateral renal sinus cysts. Bladder  is unremarkable. Stomach/Bowel: No radiopaque enteric contrast material was administered. No pathologic dilation of small or large bowel. Enterotomy sutures in the left hemiabdomen. Anastomotic sutures in the transverse colon. Prior low anterior resection with reanastomosis common no new suspicious nodularity along the suture line. Similar mesorectal/presacral fluid and soft tissue for instance on image 71/2. Vascular/Lymphatic: Aortic and branch vessel atherosclerosis. Smooth IVC contours. The portal, splenic and superior mesenteric veins are patent. No pathologically enlarged abdominal or pelvic lymph nodes. Reproductive: Prostate is unremarkable. Other: No significant abdominopelvic free fluid. Small fat containing bilateral inguinal hernias. Musculoskeletal: Similar marrow heterogeneity. No aggressive lytic or blastic lesion of bone. Thoracolumbar spondylosis. Degenerative change of the bilateral hips  and SI joints. Chronic osseous changes at the pubic symphysis. IMPRESSION: 1. Prior low anterior resection with reanastomosis. Similar mesorectal/presacral fluid and soft tissue favored posttreatment change. No new suspicious nodularity along the suture line. 2. No evidence of metastatic disease in the abdomen or pelvis. Electronically Signed   By: Reyes Holder M.D.   On: 08/22/2024 07:27    ASSESSMENT: Pathologic stage IIa rectal adenocarcinoma.  PLAN:    Pathologic stage IIa rectal adenocarcinoma: Diagnosis confirmed by imaging and biopsy.  MRI and CT scan results reviewed independently confirming stage of disease.  Patient completed neoadjuvant FOLFOX on December 30, 2022 and neoadjuvant Xeloda /XRT on February 17, 2023.  He underwent surgical resection on May 06, 2023 and was noted to have residual disease.  By report he had clear margins with 0 of 10 lymph nodes positive for malignancy.  He underwent ileostomy takedown on September 29, 2023.  His most recent imaging on February 12, 2024 reviewed independently  and reported as above with no obvious evidence of recurrent or metastatic disease.  Despite this, CEA was previously normal and now has elevated slightly to 7.6.  No intervention is needed at this time.  Will repeat laboratory work in 1 month and then patient will follow-up in 6 months with repeat laboratory work, imaging, and further evaluation.   Anemia: Hemoglobin decreased, but improved to 11.2.  Monitor.   Thrombocytopenia: Mild, monitor.   Hyperglycemia: Patient has improved blood glucose control.  Continue monitoring and treatment per primary care. Renal insufficiency: Chronic and unchanged.  Patient's GFR is 49.  Monitor.    Patient expressed understanding and was in agreement with this plan. He also understands that He can call clinic at any time with any questions, concerns, or complaints.    Cancer Staging  Rectal adenocarcinoma [facility]) Staging form: Colon and Rectum, AJCC 8th Edition - Clinical stage from 08/13/2022: Stage IIA (cT3, cN0, cM0) - Signed by [name] [name], MD on 08/13/2022 Total positive nodes: 0 - Pathologic stage from 05/24/2023: Stage IIA (ypT3, ypN0, cM0) - Signed by [name] [name], MD on 05/24/2023 Stage prefix: Post-therapy Total positive nodes: 0  [name] [name], MD   08/28/2024 2:23 PM

## 2024-08-28 NOTE — Progress Notes (Unsigned)
 Patient is still having issues with his constipation one day and the next diarrhea.

## 2024-08-29 ENCOUNTER — Other Ambulatory Visit: Payer: Self-pay

## 2024-08-29 ENCOUNTER — Encounter: Payer: Self-pay | Admitting: Oncology

## 2024-08-29 LAB — CEA: CEA: 4.5 ng/mL (ref 0.0–4.7)

## 2024-08-30 ENCOUNTER — Other Ambulatory Visit: Payer: Self-pay | Admitting: Family

## 2024-09-04 ENCOUNTER — Ambulatory Visit
Admission: RE | Admit: 2024-09-04 | Discharge: 2024-09-04 | Disposition: A | Discharge status: Home / Self Care | Source: Ambulatory Visit | Attending: Oncology | Admitting: Oncology

## 2024-09-04 DIAGNOSIS — C20 Malignant neoplasm of rectum: Secondary | ICD-10-CM

## 2024-09-07 ENCOUNTER — Encounter: Payer: Self-pay | Admitting: Oncology

## 2024-09-07 ENCOUNTER — Telehealth: Payer: Self-pay | Admitting: Oncology

## 2024-09-07 NOTE — Telephone Encounter (Signed)
 Pt spouse called and left vm to get clarity for CT - called pt spouse back and left vm with appt details and my phone number incase she has any other questions - LH

## 2024-09-11 ENCOUNTER — Encounter: Payer: Self-pay | Admitting: Family

## 2024-09-11 ENCOUNTER — Ambulatory Visit: Admitting: Family

## 2024-09-11 VITALS — BP 184/88 | HR 87 | Ht 72.0 in | Wt 196.8 lb

## 2024-09-11 DIAGNOSIS — Z933 Colostomy status: Secondary | ICD-10-CM

## 2024-09-11 DIAGNOSIS — Z0001 Encounter for general adult medical examination with abnormal findings: Secondary | ICD-10-CM

## 2024-09-11 DIAGNOSIS — E1169 Type 2 diabetes mellitus with other specified complication: Secondary | ICD-10-CM

## 2024-09-11 DIAGNOSIS — E782 Mixed hyperlipidemia: Secondary | ICD-10-CM | POA: Diagnosis not present

## 2024-09-11 DIAGNOSIS — E1142 Type 2 diabetes mellitus with diabetic polyneuropathy: Secondary | ICD-10-CM | POA: Diagnosis not present

## 2024-09-11 DIAGNOSIS — J432 Centrilobular emphysema: Secondary | ICD-10-CM

## 2024-09-11 DIAGNOSIS — I152 Hypertension secondary to endocrine disorders: Secondary | ICD-10-CM | POA: Diagnosis not present

## 2024-09-11 DIAGNOSIS — E1159 Type 2 diabetes mellitus with other circulatory complications: Secondary | ICD-10-CM | POA: Diagnosis not present

## 2024-09-11 LAB — POC CREATINE & ALBUMIN,URINE
Creatinine, POC: 100 mg/dL
Microalbumin Ur, POC: 80 mg/L

## 2024-09-18 DIAGNOSIS — J432 Centrilobular emphysema: Secondary | ICD-10-CM | POA: Insufficient documentation

## 2024-09-18 DIAGNOSIS — Z933 Colostomy status: Secondary | ICD-10-CM | POA: Insufficient documentation

## 2024-09-18 NOTE — Progress Notes (Unsigned)
 Annual Wellness Visit  Patient: [name] [name], Male    DOB: 1946-05-22, 78 y.o.   MRN: [id number] Visit Date: 09/18/2024  Today's Provider: ALAN CHRISTELLA ARRANT, FNP  Subjective:    Chief Complaint  Patient presents with   Annual Exam    AWV   [name] [name] is a 78 y.o. male who presents today for his Annual Wellness Visit.   Past Medical History:  Diagnosis Date   Anemia    Anemia of chronic disease 06/09/2023   Anginal pain    Bradycardia    Cancer (HCC)    CKD stage 3b, GFR 30-44 ml/min (HCC) 05/21/2023   Coronary artery disease    x 1 stent   Depression    Diabetes mellitus without complication (HCC)    GERD (gastroesophageal reflux disease)    History of kidney stones    Hyperlipidemia    Hyperlipidemia associated with type 2 diabetes mellitus (HCC) 04/04/2010   Qualifier: Diagnosis of  By: Wynetta CMA, Carol     Hypertension    Nephrolithiasis    Partial Large bowel obstruction (HCC)    Rectal adenocarcinoma (HCC) 08/07/2022   Past Surgical History:  Procedure Laterality Date   CARDIAC CATHETERIZATION     CATARACT EXTRACTION     COLONOSCOPY N/A 04/28/2024   Procedure: COLONOSCOPY;  Surgeon: [name], MD;  Location: [facility];  Service: Endoscopy;  Laterality: N/A;  DM   COLONOSCOPY WITH PROPOFOL  N/A 05/05/2023   Procedure: COLONOSCOPY WITH PROPOFOL ;  Surgeon: [name], MD;  Location: [facility];  Service: Endoscopy;  Laterality: N/A;  Patient having surgery tomorrow, 05/06/2023. Ok'd per [place] and office   CORONARY ANGIOPLASTY     EYE SURGERY     FLEXIBLE SIGMOIDOSCOPY N/A 07/27/2022   Procedure: FLEXIBLE SIGMOIDOSCOPY;  Surgeon: [name], MD;  Location: [facility];  Service: Endoscopy;  Laterality: N/A;   ILEOSTOMY CLOSURE N/A 09/29/2023   Procedure: OPEN TAKEDOWN OF LOOP ILEOSTOMY, LEFT TAP BLOCK;  Surgeon: [name], MD;  Location: [facility];  Service: General;  Laterality: N/A;   KIDNEY STONE  SURGERY     NECK SURGERY  12/18/2011   nephrolithiasis     PORTACATH PLACEMENT Right 08/24/2022   Procedure: INSERTION PORT-A-CATH;  Surgeon: [name], MD;  Location: [facility];  Service: General;  Laterality: Right;   RECTAL EXAM UNDER ANESTHESIA N/A 09/29/2023   Procedure: RECTAL EXAM UNDER ANESTHESIA;  Surgeon: [name], MD;  Location: [facility];  Service: General;  Laterality: N/A;   stent (other)     TRANSVERSE LOOP COLOSTOMY N/A 07/28/2022   Procedure: TRANSVERSE LOOP COLOSTOMY;  Surgeon: [name], MD;  Location: [facility];  Service: General;  Laterality: N/A;   XI ROBOTIC ASSISTED LOWER ANTERIOR RESECTION N/A 05/06/2023   Procedure: XI ROBOTIC ASSISTED LOWER ANTERIOR RECTOSIGMOID RESECTION, TAKEDOWN OF OLD LOOP COLOSTOMY WITH ANASTOMOSIS, MOBILIZATION OF SPLENIC FLEXURE OF COLON, AND NEW DIVERTING LOOP ILEOSTOMY, INTRAOPERATIVE ASSESSMENT OF TISSUE VASCULAR PERFUSION USING ICG (INDOCYANINE GREEN ) IMMUNOFLUORESCENCE, FLEXIBLE SIGMOIDOSCOPY;  Surgeon: [name], MD;  Location: [facility];  Service: Lorna Ano   Family History  Problem Relation Age of Onset   Heart disease Father    Social History   Socioeconomic History   Marital status: Married    Spouse name: Doris   Number of children: Not on file   Years of education: Not on file   Highest education level: Not on file  Occupational History   Not on file  Tobacco  Use   Smoking status: Some Days    Current packs/day: 0.25    Average packs/day: 0.3 packs/day for 36.0 years (9.0 ttl pk-yrs)    Types: Cigarettes   Smokeless tobacco: Never   Tobacco comments:    Smokes 6 cigarettes daily   Vaping Use   Vaping status: Never Used  Substance and Sexual Activity   Alcohol use: No   Drug use: No   Sexual activity: Not on file  Other Topics Concern   Not on file  Social History Narrative   Not on file   Social Drivers of Health   Financial Resource Strain: Low Risk  (09/11/2024)   Overall  Financial Resource Strain (CARDIA)    Difficulty of Paying Living Expenses: Not hard at all  Food Insecurity: No Food Insecurity (09/11/2024)   Hunger Vital Sign    Worried About Running Out of Food in the Last Year: Never true    Ran Out of Food in the Last Year: Never true  Transportation Needs: No Transportation Needs (09/11/2024)   PRAPARE - Administrator, Civil Service (Medical): No    Lack of Transportation (Non-Medical): No  Physical Activity: Insufficiently Active (09/11/2024)   Exercise Vital Sign    Days of Exercise per Week: 3 days    Minutes of Exercise per Session: 10 min  Stress: No Stress Concern Present (09/11/2024)   [employer] of Occupational Health - Occupational Stress Questionnaire    Feeling of Stress: Not at all  Social Connections: Patient Declined (09/11/2024)   Social Connection and Isolation Panel    Frequency of Communication with Friends and Family: Patient declined    Frequency of Social Gatherings with Friends and Family: Patient declined    Attends Religious Services: Patient declined    Database Administrator or Organizations: Patient declined    Attends Banker Meetings: Patient declined    Marital Status: Patient declined  Intimate Partner Violence: Not At Risk (09/11/2024)   Humiliation, Afraid, Rape, and Kick questionnaire    Fear of Current or Ex-Partner: No    Emotionally Abused: No    Physically Abused: No    Sexually Abused: No    Medications: Outpatient Medications Prior to Visit  Medication Sig   aspirin  81 MG EC tablet Take 81 mg by mouth daily.     atorvastatin  (LIPITOR) 40 MG tablet TAKE 1 TABLET BY MOUTH EVERY DAY   bisacodyl  (DULCOLAX) 5 MG EC tablet Take 5 mg by mouth daily as needed for moderate constipation.   Cholecalciferol (VITAMIN D -3 PO) Take 1 tablet by mouth daily.   Cyanocobalamin  (VITAMIN B-12 PO) Take 1 tablet by mouth daily.   diphenoxylate -atropine  (LOMOTIL ) 2.5-0.025 MG tablet Take  1 tablet by mouth 2 (two) times daily.   famotidine  (PEPCID ) 40 MG tablet TAKE 1 TABLET BY MOUTH EVERY DAY   ferrous sulfate  325 (65 FE) MG tablet Take 1 tablet by mouth 2 (two) times daily with a meal.   glimepiride  (AMARYL ) 4 MG tablet Take 4 mg by mouth daily with breakfast.   isosorbide  mononitrate (IMDUR ) 30 MG 24 hr tablet TAKE 1 TABLET BY MOUTH EVERY DAY   lidocaine -prilocaine  (EMLA ) cream Apply 1 Application topically as needed.   loperamide  (IMODIUM ) 2 MG capsule Take 2 mg by mouth as needed for diarrhea or loose stools.   losartan  (COZAAR ) 25 MG tablet TAKE 1 TABLET (25 MG TOTAL) BY MOUTH DAILY.   metFORMIN  (GLUCOPHAGE ) 1000 MG tablet Take 1,000 mg by  mouth daily.   nitroGLYCERIN  (NITROSTAT ) 0.4 MG SL tablet Place 1 tablet (0.4 mg total) under the tongue every 5 (five) minutes as needed for chest pain.   ondansetron  (ZOFRAN ) 8 MG tablet Take 8 mg by mouth every 8 (eight) hours as needed for nausea or vomiting.   pioglitazone  (ACTOS ) 15 MG tablet Take 15 mg by mouth daily.   psyllium (METAMUCIL) 58.6 % packet Take 1 packet by mouth daily.   psyllium (REGULOID) 0.52 g capsule Take 0.52 g by mouth.   traMADol  (ULTRAM ) 50 MG tablet Take 1-2 tablets (50-100 mg total) by mouth every 6 (six) hours as needed for moderate pain (pain score 4-6).   No facility-administered medications prior to visit.    No Known Allergies  Patient Care Team: [name], FNP as PCP - General (Family Medicine) [name], MD as PCP - Cardiology (Cardiology) [name], MD as Consulting Physician (Cardiology) [name], RN as Oncology Nurse Navigator [name], [name], MD as Consulting Physician (Oncology) [name], MD as Consulting Physician (Colon and Rectal Surgery) [name], MD as Consulting Physician (Radiation Oncology)  Review of Systems  All other systems reviewed and are negative.     Objective:    Vitals: BP (!) 184/88   Pulse 87   Ht 6' (1.829 m)    Wt 196 lb 12.8 oz (89.3 kg)   SpO2 96%   BMI 26.69 kg/m  {Show previous vitals signs (optional):23777}  Physical Exam Vitals reviewed.  Constitutional:      Appearance: Normal appearance. He is normal weight.  HENT:     Head: Normocephalic and atraumatic.  Musculoskeletal:        General: Normal range of motion.     Cervical back: Normal range of motion.  Neurological:     General: No focal deficit present.     Mental Status: He is alert and oriented to person, place, and time. Mental status is at baseline.  Psychiatric:        Mood and Affect: Mood normal.        Behavior: Behavior normal.        Thought Content: Thought content normal.        Judgment: Judgment normal.      Most recent functional status assessment:    09/11/2024   11:06 AM  In your present state of health, do you have any difficulty performing the following activities:  Hearing? 0  Vision? 1  Difficulty concentrating or making decisions? 0  Walking or climbing stairs? 0  Dressing or bathing? 0  Doing errands, shopping? 0  Preparing Food and eating ? N  Using the Toilet? N  In the past six months, have you accidently leaked urine? N  Do you have problems with loss of bowel control? N  Managing your Medications? N  Managing your Finances? N  Housekeeping or managing your Housekeeping? N    Most recent fall risk assessment:    06/04/2017    2:51 PM  Fall Risk   Falls in the past year? No   Comment Emmi Telephone Survey: data to providers prior to load      Data saved with a previous flowsheet row definition     Most recent depression screenings:    09/11/2024   11:09 AM 12/10/2023   10:16 AM  PHQ 2/9 Scores  PHQ - 2 Score 3 2  PHQ- 9 Score 5 5    Most recent cognitive screening:  09/18/2024    9:20 AM  6CIT Screen  What Year? 0 points  What month? 0 points  What time? 0 points  Count back from 20 0 points  Months in reverse 0 points  Repeat phrase 2 points  Total Score 2  points    Results for orders placed or performed in visit on 09/11/24  POC CREATINE & ALBUMIN ,URINE  Result Value Ref Range   Microalbumin Ur, POC 80 mg/L   Creatinine, POC 100 mg/dL   Albumin /Creatinine Ratio, Urine, POC 30-300        Assessment & Plan:      Annual wellness visit done today including the all of the following: Reviewed patient's Family Medical History Reviewed and updated list of patient's medical providers Assessment of cognitive impairment was done Assessed patient's functional ability Established a written schedule for health screening services Health Risk Assessent Completed and Reviewed  Exercise Activities and Dietary recommendations  Goals   None      There is no immunization history on file for this patient.  Health Maintenance  Topic Date Due   Medicare Annual Wellness (AWV)  Never done   COVID-19 Vaccine (1) Never done   FOOT EXAM  Never done   DTaP/Tdap/Td (1 - Tdap) Never done   Pneumococcal Vaccine: 50+ Years (1 of 2 - PCV) Never done   Zoster Vaccines- Shingrix (1 of 2) Never done   Influenza Vaccine  02/13/2025 (Originally 06/16/2024)   HEMOGLOBIN A1C  12/10/2024   OPHTHALMOLOGY EXAM  03/22/2025   Diabetic kidney evaluation - eGFR measurement  08/28/2025   Diabetic kidney evaluation - Urine ACR  09/11/2025   Hepatitis C Screening  Completed   Meningococcal B Vaccine  Aged Out   Colonoscopy  Discontinued     Discussed health benefits of physical activity, and encouraged him to engage in regular exercise appropriate for his age and condition.    ***     Ethon Wymer CHRISTELLA ARRANT, FNP   09/11/2024  This document may have been prepared by [facility] Voice Recognition software and as such may include unintentional dictation errors.

## 2024-10-01 ENCOUNTER — Other Ambulatory Visit: Payer: Self-pay

## 2024-10-03 ENCOUNTER — Other Ambulatory Visit: Payer: Self-pay

## 2024-11-15 NOTE — Progress Notes (Signed)
 [name] [name]                                          MRN: [id number]   11/15/2024   The [facility] Quality Team Specialist reviewed this patient medical record for the purposes of chart review for care gap closure. The following were reviewed: chart review for care gap closure-controlling blood pressure.    [facility] Quality Team

## 2024-12-12 ENCOUNTER — Ambulatory Visit: Admitting: Family

## 2024-12-20 ENCOUNTER — Ambulatory Visit: Admitting: Family

## 2024-12-20 ENCOUNTER — Encounter: Payer: Self-pay | Admitting: Family

## 2024-12-20 DIAGNOSIS — C20 Malignant neoplasm of rectum: Secondary | ICD-10-CM

## 2024-12-20 DIAGNOSIS — N1832 Chronic kidney disease, stage 3b: Secondary | ICD-10-CM

## 2024-12-20 DIAGNOSIS — E1142 Type 2 diabetes mellitus with diabetic polyneuropathy: Secondary | ICD-10-CM

## 2024-12-20 DIAGNOSIS — J432 Centrilobular emphysema: Secondary | ICD-10-CM

## 2024-12-20 DIAGNOSIS — Z933 Colostomy status: Secondary | ICD-10-CM

## 2024-12-20 DIAGNOSIS — Z0130 Encounter for examination of blood pressure without abnormal findings: Secondary | ICD-10-CM

## 2024-12-20 NOTE — Assessment & Plan Note (Signed)
 Patient is seen by nephrology, who manage this condition.  He is well controlled with current therapy.   Will defer to them for further changes to plan of care.

## 2024-12-20 NOTE — Progress Notes (Signed)
 "  Established Patient Office Visit  Subjective:  Patient ID: [name] [name], male    DOB: 04/05/1946  Age: 79 y.o. MRN: [id number]  Chief Complaint  Patient presents with   Follow-up    3 month follow up    Patient is here today for his 3 months follow up.  He has been feeling fairly well since last appointment.   He does not have additional concerns to discuss today.  Labs are due today.  He needs refills.   I have reviewed his active problem list, medication list, allergies, health maintenance, notes from last encounter, lab results for his appointment today.      No other concerns at this time.   Past Medical History:  Diagnosis Date   Anemia    Anemia of chronic disease 06/09/2023   Anginal pain    Bradycardia    Cancer (HCC)    CKD stage 3b, GFR 30-44 ml/min (HCC) 05/21/2023   Coronary artery disease    x 1 stent   Depression    Diabetes mellitus without complication (HCC)    GERD (gastroesophageal reflux disease)    History of kidney stones    Hyperlipidemia    Hyperlipidemia associated with type 2 diabetes mellitus (HCC) 04/04/2010   Qualifier: Diagnosis of  By: Wynetta CMA, Carol     Hypertension    Nephrolithiasis    Partial Large bowel obstruction (HCC)    Rectal adenocarcinoma (HCC) 08/07/2022    Past Surgical History:  Procedure Laterality Date   CARDIAC CATHETERIZATION     CATARACT EXTRACTION     COLONOSCOPY N/A 04/28/2024   Procedure: COLONOSCOPY;  Surgeon: [name], MD;  Location: [facility];  Service: Endoscopy;  Laterality: N/A;  DM   COLONOSCOPY WITH PROPOFOL  N/A 05/05/2023   Procedure: COLONOSCOPY WITH PROPOFOL ;  Surgeon: [name], MD;  Location: [facility];  Service: Endoscopy;  Laterality: N/A;  Patient having surgery tomorrow, 05/06/2023. Ok'd per [place] and office   CORONARY ANGIOPLASTY     EYE SURGERY     FLEXIBLE SIGMOIDOSCOPY N/A 07/27/2022   Procedure: FLEXIBLE SIGMOIDOSCOPY;  Surgeon: [name], MD;  Location: [facility];  Service: Endoscopy;  Laterality: N/A;   ILEOSTOMY CLOSURE N/A 09/29/2023   Procedure: OPEN TAKEDOWN OF LOOP ILEOSTOMY, LEFT TAP BLOCK;  Surgeon: [name], MD;  Location: [facility];  Service: General;  Laterality: N/A;   KIDNEY STONE SURGERY     NECK SURGERY  12/18/2011   nephrolithiasis     PORTACATH PLACEMENT Right 08/24/2022   Procedure: INSERTION PORT-A-CATH;  Surgeon: [name], MD;  Location: [facility];  Service: General;  Laterality: Right;   RECTAL EXAM UNDER ANESTHESIA N/A 09/29/2023   Procedure: RECTAL EXAM UNDER ANESTHESIA;  Surgeon: [name], MD;  Location: [facility];  Service: General;  Laterality: N/A;   stent (other)     TRANSVERSE LOOP COLOSTOMY N/A 07/28/2022   Procedure: TRANSVERSE LOOP COLOSTOMY;  Surgeon: [name], MD;  Location: [facility];  Service: General;  Laterality: N/A;   XI ROBOTIC ASSISTED LOWER ANTERIOR RESECTION N/A 05/06/2023   Procedure: XI ROBOTIC ASSISTED LOWER ANTERIOR RECTOSIGMOID RESECTION, TAKEDOWN OF OLD LOOP COLOSTOMY WITH ANASTOMOSIS, MOBILIZATION OF SPLENIC FLEXURE OF COLON, AND NEW DIVERTING LOOP ILEOSTOMY, INTRAOPERATIVE ASSESSMENT OF TISSUE VASCULAR PERFUSION USING ICG (INDOCYANINE GREEN ) IMMUNOFLUORESCENCE, FLEXIBLE SIGMOIDOSCOPY;  Surgeon: [name], MD;  Location: [facility];  Service: Lorna Ano    Social History   Socioeconomic History   Marital status: Married  Spouse name: Doris   Number of children: Not on file   Years of education: Not on file   Highest education level: Not on file  Occupational History   Not on file  Tobacco Use   Smoking status: Some Days    Current packs/day: 0.25    Average packs/day: 0.3 packs/day for 36.0 years (9.0 ttl pk-yrs)    Types: Cigarettes   Smokeless tobacco: Never   Tobacco comments:    Smokes 6 cigarettes daily   Vaping Use   Vaping status: Never Used  Substance and Sexual Activity   Alcohol use: No   Drug use: No    Sexual activity: Not on file  Other Topics Concern   Not on file  Social History Narrative   Not on file   Social Drivers of Health   Tobacco Use: High Risk (11/23/2024)   Received from [facility]   Patient History    Smoking Tobacco Use: Every Day    Smokeless Tobacco Use: Never    Passive Exposure: Not on file  Financial Resource Strain: Low Risk (09/11/2024)   Overall Financial Resource Strain (CARDIA)    Difficulty of Paying Living Expenses: Not hard at all  Food Insecurity: No Food Insecurity (09/11/2024)   Epic    Worried About Radiation Protection Practitioner of Food in the Last Year: Never true    Ran Out of Food in the Last Year: Never true  Transportation Needs: No Transportation Needs (09/11/2024)   Epic    Lack of Transportation (Medical): No    Lack of Transportation (Non-Medical): No  Physical Activity: Insufficiently Active (09/11/2024)   Exercise Vital Sign    Days of Exercise per Week: 3 days    Minutes of Exercise per Session: 10 min  Stress: No Stress Concern Present (09/11/2024)   [employer] of Occupational Health - Occupational Stress Questionnaire    Feeling of Stress: Not at all  Social Connections: Patient Declined (09/11/2024)   Social Connection and Isolation Panel    Frequency of Communication with Friends and Family: Patient declined    Frequency of Social Gatherings with Friends and Family: Patient declined    Attends Religious Services: Patient declined    Active Member of Clubs or Organizations: Patient declined    Attends Banker Meetings: Patient declined    Marital Status: Patient declined  Intimate Partner Violence: Not At Risk (09/11/2024)   Epic    Fear of Current or Ex-Partner: No    Emotionally Abused: No    Physically Abused: No    Sexually Abused: No  Depression (PHQ2-9): Medium Risk (09/11/2024)   Depression (PHQ2-9)    PHQ-2 Score: 5  Alcohol Screen: Low Risk (09/11/2024)   Alcohol Screen    Last Alcohol  Screening Score (AUDIT): 0  Housing: Unknown (09/11/2024)   Epic    Unable to Pay for Housing in the Last Year: Not on file    Number of Times Moved in the Last Year: 0    Homeless in the Last Year: No  Utilities: Not At Risk (09/11/2024)   Epic    Threatened with loss of utilities: No  Health Literacy: Adequate Health Literacy (09/11/2024)   B1300 Health Literacy    Frequency of need for help with medical instructions: Never    Family History  Problem Relation Age of Onset   Heart disease Father     Allergies[1]  Review of Systems  All other systems reviewed and are negative.  Objective:   BP (!) 146/82   Pulse 77   Ht 6' (1.829 m)   Wt 201 lb 9.6 oz (91.4 kg)   SpO2 94%   BMI 27.34 kg/m   Vitals:   12/20/24 1316  BP: (!) 146/82  Pulse: 77  Height: 6' (1.829 m)  Weight: 201 lb 9.6 oz (91.4 kg)  SpO2: 94%  BMI (Calculated): 27.34    Physical Exam Vitals and nursing note reviewed.  Constitutional:      Appearance: Normal appearance. He is normal weight.  Eyes:     Pupils: Pupils are equal, round, and reactive to light.  Cardiovascular:     Rate and Rhythm: Normal rate and regular rhythm.     Pulses: Normal pulses.     Heart sounds: Normal heart sounds.  Pulmonary:     Effort: Pulmonary effort is normal.     Breath sounds: Normal breath sounds.  Neurological:     Mental Status: He is alert.  Psychiatric:        Mood and Affect: Mood normal.        Behavior: Behavior normal.      No results found for any visits on 12/20/24.  No results found for this or any previous visit (from the past 2160 hours).     Assessment & Plan Colostomy status (HCC) Patient stable.  Well controlled with current therapy.   Continue current meds.   Rectal adenocarcinoma [facility]) Patient is seen by oncology, who manage this condition.  He is well controlled with current therapy.   Will defer to them for further changes to plan of care.  Centrilobular  emphysema (HCC) Patient is seen by pulmonary, who manage this condition.  He is well controlled with current therapy.   Will defer to them for further changes to plan of care.  Type 2 diabetes mellitus with diabetic polyneuropathy, without long-term current use of insulin  (HCC) Continue current diabetes POC, as patient has been well controlled on current regimen.  Will adjust meds if needed based on labs.   CKD stage 3b, GFR 30-44 ml/min (HCC)  Patient is seen by nephrology, who manage this condition.  He is well controlled with current therapy.   Will defer to them for further changes to plan of care.    Return in about 3 months (around 03/19/2025) for F/U.   Total time spent: 30 minutes  ALAN CHRISTELLA ARRANT, FNP  12/20/2024   This document may have been prepared by [facility] Voice Recognition software and as such may include unintentional dictation errors.     [1] No Known Allergies  "

## 2024-12-20 NOTE — Assessment & Plan Note (Signed)
 Patient is seen by oncology, who manage this condition.  He is well controlled with current therapy.   Will defer to them for further changes to plan of care.

## 2024-12-20 NOTE — Assessment & Plan Note (Signed)
 Patient stable.  Well controlled with current therapy.   Continue current meds.

## 2024-12-20 NOTE — Assessment & Plan Note (Signed)
 Patient is seen by pulmonary, who manage this condition.  He is well controlled with current therapy.   Will defer to them for further changes to plan of care.

## 2024-12-20 NOTE — Assessment & Plan Note (Signed)
 Continue current diabetes POC, as patient has been well controlled on current regimen.  Will adjust meds if needed based on labs.

## 2024-12-21 ENCOUNTER — Other Ambulatory Visit: Payer: Self-pay

## 2024-12-22 ENCOUNTER — Encounter: Payer: Self-pay | Admitting: Oncology

## 2025-02-20 ENCOUNTER — Ambulatory Visit: Admitting: Cardiovascular Disease

## 2025-02-22 ENCOUNTER — Inpatient Hospital Stay

## 2025-02-22 ENCOUNTER — Other Ambulatory Visit

## 2025-02-26 ENCOUNTER — Other Ambulatory Visit

## 2025-03-01 ENCOUNTER — Inpatient Hospital Stay: Admitting: Oncology

## 2025-03-01 ENCOUNTER — Inpatient Hospital Stay

## 2025-04-04 ENCOUNTER — Ambulatory Visit: Admitting: Family
